# Patient Record
Sex: Female | Born: 1960
Health system: Southern US, Community
[De-identification: ages and names within clinical notes are randomized; demographics above are authoritative.]

## PROBLEM LIST (undated history)

## (undated) DIAGNOSIS — IMO0001 Reserved for inherently not codable concepts without codable children: Secondary | ICD-10-CM

## (undated) DIAGNOSIS — G629 Polyneuropathy, unspecified: Secondary | ICD-10-CM

## (undated) DIAGNOSIS — C3490 Malignant neoplasm of unspecified part of unspecified bronchus or lung: Secondary | ICD-10-CM

## (undated) DIAGNOSIS — I10 Essential (primary) hypertension: Secondary | ICD-10-CM

## (undated) DIAGNOSIS — E1165 Type 2 diabetes mellitus with hyperglycemia: Secondary | ICD-10-CM

## (undated) DIAGNOSIS — E785 Hyperlipidemia, unspecified: Secondary | ICD-10-CM

## (undated) DIAGNOSIS — J349 Unspecified disorder of nose and nasal sinuses: Secondary | ICD-10-CM

## (undated) DIAGNOSIS — IMO0002 Reserved for concepts with insufficient information to code with codable children: Secondary | ICD-10-CM

## (undated) DIAGNOSIS — J449 Chronic obstructive pulmonary disease, unspecified: Secondary | ICD-10-CM

## (undated) HISTORY — DX: Reserved for inherently not codable concepts without codable children: IMO0001

## (undated) HISTORY — DX: Essential (primary) hypertension: I10

## (undated) HISTORY — DX: Polyneuropathy, unspecified: G62.9

## (undated) HISTORY — DX: Unspecified disorder of nose and nasal sinuses: J34.9

## (undated) HISTORY — DX: Reserved for concepts with insufficient information to code with codable children: IMO0002

## (undated) HISTORY — DX: Hyperlipidemia, unspecified: E78.5

## (undated) HISTORY — DX: Malignant neoplasm of unspecified part of unspecified bronchus or lung: C34.90

---

## 1992-07-27 HISTORY — PX: TUBAL LIGATION: SHX77

## 1999-03-14 ENCOUNTER — Other Ambulatory Visit: Admission: RE | Admit: 1999-03-14 | Discharge: 1999-03-14 | Payer: Self-pay | Admitting: Obstetrics and Gynecology

## 2004-03-27 ENCOUNTER — Ambulatory Visit (HOSPITAL_COMMUNITY): Admission: RE | Admit: 2004-03-27 | Discharge: 2004-03-27 | Payer: Self-pay | Admitting: General Surgery

## 2004-03-27 ENCOUNTER — Encounter (INDEPENDENT_AMBULATORY_CARE_PROVIDER_SITE_OTHER): Payer: Self-pay | Admitting: Specialist

## 2005-03-25 ENCOUNTER — Other Ambulatory Visit: Admission: RE | Admit: 2005-03-25 | Discharge: 2005-03-25 | Payer: Self-pay | Admitting: Family Medicine

## 2006-10-06 ENCOUNTER — Encounter: Admission: RE | Admit: 2006-10-06 | Discharge: 2006-10-06 | Payer: Self-pay | Admitting: Sports Medicine

## 2006-10-20 ENCOUNTER — Encounter: Admission: RE | Admit: 2006-10-20 | Discharge: 2006-10-20 | Payer: Self-pay | Admitting: Sports Medicine

## 2006-11-03 ENCOUNTER — Encounter: Admission: RE | Admit: 2006-11-03 | Discharge: 2006-11-03 | Payer: Self-pay | Admitting: Sports Medicine

## 2008-01-23 ENCOUNTER — Emergency Department (HOSPITAL_BASED_OUTPATIENT_CLINIC_OR_DEPARTMENT_OTHER): Admission: EM | Admit: 2008-01-23 | Discharge: 2008-01-24 | Payer: Self-pay | Admitting: Emergency Medicine

## 2008-02-22 ENCOUNTER — Encounter (INDEPENDENT_AMBULATORY_CARE_PROVIDER_SITE_OTHER): Payer: Self-pay | Admitting: Obstetrics and Gynecology

## 2008-02-22 ENCOUNTER — Ambulatory Visit (HOSPITAL_COMMUNITY): Admission: RE | Admit: 2008-02-22 | Discharge: 2008-02-22 | Payer: Self-pay | Admitting: Obstetrics and Gynecology

## 2010-06-25 ENCOUNTER — Encounter: Admission: RE | Admit: 2010-06-25 | Discharge: 2010-06-25 | Payer: Self-pay | Admitting: Family Medicine

## 2010-12-09 NOTE — H&P (Signed)
Carol Buchanan, Carol Buchanan            ACCOUNT NO.:  [id number]   MEDICAL RECORD NO.:  [id number]          PATIENT TYPE:  AMB   LOCATION:  SDC                           FACILITY:  [facility]   PHYSICIAN:  Naima A. Dillard, M.D. DATE OF BIRTH:  01-Nov-1960   DATE OF ADMISSION:  02/22/2008  DATE OF DISCHARGE:                              HISTORY & PHYSICAL   The patient is a 50 year old female, gravida 3, para 0 who complained of  irregular heavy vaginal bleeding soaking through her clothes and lasting  greater than 10 days.  The patient is not on any contraception.  Denies  having any fibroids, not on any hormone therapy.  She does have  menopausal symptoms, which include mood swing, hot flashes, urinary  urgency, and poor sleeping.  The patient denies any pelvic pain.   PAST MEDICAL HISTORY:  Significant for chronic hypertension,  hypercholesterolemia, and arthritis.   MEDICATIONS:  Include Cymbalta, meloxicam, and the blood pressure  medicine and cholesterol medicine.  The patient forgot the name and did  not have her medicines with her.   ALLERGIES:  The patient has no known drug allergies.   SOCIAL HISTORY:  The patient smokes a pack a day for 30 years.  Occasional alcohol use.  No illicit drug use.   PAST SURGICAL HISTORY:  Surgeries are significant for bilateral tubal  ligation and 3 elective abortions.  The patient had LASIK surgery on her  eyes in May 2008.   FAMILY HISTORY:  No GYN cancer.  Significant for heart disease and high  blood pressure.   REVIEW OF SYSTEMS:  CARDIOVASCULAR:  Significant for hypertension.  HEMATOLOGICAL:  Significant for cholesterol.  MUSCULOSKELETAL:  Significant for arthritis.  GENITOURINARY:  Significant for heavy  vaginal bleeding.   PHYSICAL EXAMINATION:  VITAL SIGNS:  The patient weighted 141 pounds,  blood pressure is 110/70 and she is 5 feet 3-1/2 inches.  HEENT:  Pupils are equal.  Hearing is normal.  Throat is clear.  Thyroid  is not  enlarged.  HEART:  Regular rate and rhythm.  LUNGS:  Clear to auscultation bilaterally.  BREASTS:  No masses, discharge, skin change, or nipple retraction.  BACK:  No CVA tenderness.  ABDOMEN:  Nontender without any masses or organomegaly.  EXTREMITIES:  No cyanosis, clubbing, or edema.  NEUROLOGIC:  Within normal limits.  VAGINAL:  Within normal limits.  Cervix is nontender without any  lesions.  Uterus is normal in size, shape, and consistency.  Adnexa is  nontender.   On ultrasound, she had a uterus that measured 7.3 x 4.9 x 3.79 with  normal ovaries with 3 questionable hyperechoic masses; 0.82 x 0.47, 0.66  x 0.51, and 0.68 x 0.51 suggestive of polyps.  The patient was offered  sonohystogram, but  chose to do [facility] hysteroscopy with an endometrial ablation.  She  understands the risks are but not limited to bleeding, infection, damage  to internal organs, such as bowel, bladder, or major blood vessels, and  perforation of the uterus.  She desires to proceed with this.      Naima A. Normand Sloop, M.D.  Electronically  Signed     NAD/MEDQ  D:  02/22/2008  T:  02/22/2008  Job:  [id number]

## 2010-12-09 NOTE — Op Note (Signed)
NAMEJANELLI, WELLING            ACCOUNT NO.:  [id number]   MEDICAL RECORD NO.:  [id number]          PATIENT TYPE:  AMB   LOCATION:  SDC                           FACILITY:  [facility]   PHYSICIAN:  Naima A. Dillard, M.D. DATE OF BIRTH:  [birth year]   DATE OF PROCEDURE:  02/22/2008  DATE OF DISCHARGE:                               OPERATIVE REPORT   PREOPERATIVE DIAGNOSIS:  Menorrhagia.   POSTOPERATIVE DIAGNOSIS:  Menorrhagia.   PROCEDURES:  1. Dilation and curettage.  2. Hysteroscopy.  3. ThermaChoice endometrial ablation.   SURGEON:  Naima A. Dillard, MD   ASSISTANT:  None.   ANESTHESIA:  General.   FINDINGS:  Normal uterus with no endometrial polyps or submucosal  fibroids.  Both ostia visualized.  Uterus did sound to 7 cm.  Anesthesia  was general with 20 mL of 1% local for cervical block.  Endometrial  curettings were sent to pathology.   ESTIMATED BLOOD LOSS:  Minimal.   There were no complications.   The patient to PACU in stable condition.   PROCEDURE IN DETAIL:  The patient was taken to the operating room where  she was given general anesthesia with laryngeal mask airway, prepped and  draped in a normal sterile fashion in dorsal lithotomy position.  The  bladder was drained with a straight catheter.  A bivalve speculum was  placed into the vagina.  The anterior lip of the cervix was grasped with  a single-tooth tenaculum.  The cervix was then dilated with Pratt  dilators to 21.  The hysteroscope was placed into the uterine cavity.  Both ostia were visualized.  There was normal endometrial lining noted.  No submucosal fibroids or polyps visualized.  The hysteroscope was  removed.  A sharp curettage was done.  Endometrial curettings were sent  to pathology.  ThermaChoice was done per protocol without any  difficulty.  After ThermaChoice was done, I did take a second look and  the entire fundus and uterine cavity appeared to have been  properly ablated.  There were no  pink areas visualized.  All instruments  were removed from the vagina.  Tenaculum site was made hemostatic with  silver nitrate.  Sponge, lap, and needle counts were correct.  The  patient went to recovery room in stable condition.      Naima A. Normand Sloop, M.D.  Electronically Signed     NAD/MEDQ  D:  02/22/2008  T:  02/23/2008  Job:  [id number]

## 2010-12-12 NOTE — Op Note (Signed)
Carol Buchanan, Carol Buchanan                      ACCOUNT NO.:  [id number]   MEDICAL RECORD NO.:  [id number]                   PATIENT TYPE:  AMB   LOCATION:  DAY                                  FACILITY:  [facility]   PHYSICIAN:  Timothy E. Earlene Plater, M.D.              DATE OF BIRTH:  Oct 02, 1960   DATE OF PROCEDURE:  03/27/2004  DATE OF DISCHARGE:                                 OPERATIVE REPORT   PREOPERATIVE DIAGNOSES:  Internal and external hemorrhoids.   POSTOPERATIVE DIAGNOSES:  Internal and external hemorrhoids.   PROCEDURE:  Complex hemorrhoidectomy.   SURGEON:  Timothy E. Earlene Plater, M.D.   ANESTHESIA:  General.   [name] is 21 otherwise healthy, has had hemorrhoids for many months  with protrusion, pain and bleeding. A minor fissure has been treated  conservatively and she has failed other methods for treatment of hemorrhoids  and therefore wishes to proceed with a surgical hemorrhoidectomy. She agrees  and understands. She was identified and the permit signed.   She was taken to the operating room, placed supine, LMA anesthesia provided.  She was then lifted to lithotomy position, perianal area was inspected,  prepped and draped in the usual fashion.  Prominent anterior hemorrhoid was  present with prolapse, a small right posterior and a smaller left posterior  hemorrhoid were present with large internal and external components. The  anus was injected around and about with Marcaine, epinephrine and Wydase and  massaged in well. The anterior hemorrhoid was excised first after placing a  suture ligature of 2-0 chromic at its apex, it was carefully removed and  separated from the underlying sphincter and muscle layer. The subsequent  wound was then closed with a running 2-0 chromic with careful approximation  to the anoderm and external skin.  Bleeding was controlled. Attention was  turned to the right posterior and left posterior hemorrhoids which were  excised in a similar  fashion but smaller ellipses and each was repaired in a  similar fashion.  All areas were checked for bleeding. The wounds were dry.  To note, the prep was poor with a constant protrusion of liquid stool. This  was suctioned away and the intrarectal vault was prepped with three times  with Betadine.  To finish a Gelfoam gauze pack wrapped in Surgicel was  placed in the rectal canal.  Dry sterile dressing applied. She tolerated it  well and was extubated and taken to the recovery room in good condition.   Written and verbal instructions were given to her and her sister including  Percocet #[id number] and she will be followed in the office.                                               Timothy E. Earlene Plater, M.D.    TED/MEDQ  D:  03/27/2004  T:  03/27/2004  Job:  [id number]   cc:   Otilio Connors. Gerri Spore, M.D.  [address]  [place]  [place] [address]  Fax: [phone]

## 2011-04-24 LAB — PREGNANCY, URINE: Preg Test, Ur: NEGATIVE

## 2011-04-24 LAB — CBC
RBC: 4.24
WBC: 8.4

## 2011-10-09 ENCOUNTER — Other Ambulatory Visit: Payer: Self-pay | Admitting: Orthopedic Surgery

## 2011-10-09 DIAGNOSIS — M5412 Radiculopathy, cervical region: Secondary | ICD-10-CM

## 2011-10-13 ENCOUNTER — Ambulatory Visit
Admission: RE | Admit: 2011-10-13 | Discharge: 2011-10-13 | Disposition: A | Discharge status: Home / Self Care | Payer: BC Managed Care – PPO | Source: Ambulatory Visit | Attending: Orthopedic Surgery | Admitting: Orthopedic Surgery

## 2011-10-13 DIAGNOSIS — M5412 Radiculopathy, cervical region: Secondary | ICD-10-CM

## 2011-10-15 ENCOUNTER — Other Ambulatory Visit: Payer: Self-pay

## 2011-10-19 ENCOUNTER — Other Ambulatory Visit: Payer: Self-pay | Admitting: Orthopedic Surgery

## 2011-10-19 DIAGNOSIS — M4712 Other spondylosis with myelopathy, cervical region: Secondary | ICD-10-CM

## 2011-10-21 ENCOUNTER — Other Ambulatory Visit: Payer: BC Managed Care – PPO

## 2011-10-23 ENCOUNTER — Ambulatory Visit
Admission: RE | Admit: 2011-10-23 | Discharge: 2011-10-23 | Disposition: A | Discharge status: Home / Self Care | Payer: BC Managed Care – PPO | Source: Ambulatory Visit | Attending: Orthopedic Surgery | Admitting: Orthopedic Surgery

## 2011-10-23 DIAGNOSIS — M4712 Other spondylosis with myelopathy, cervical region: Secondary | ICD-10-CM

## 2012-03-16 ENCOUNTER — Other Ambulatory Visit: Payer: Self-pay | Admitting: Family Medicine

## 2012-03-16 DIAGNOSIS — Z1231 Encounter for screening mammogram for malignant neoplasm of breast: Secondary | ICD-10-CM

## 2012-04-06 ENCOUNTER — Ambulatory Visit
Admission: RE | Admit: 2012-04-06 | Discharge: 2012-04-06 | Disposition: A | Discharge status: Home / Self Care | Payer: BC Managed Care – PPO | Source: Ambulatory Visit | Attending: Family Medicine | Admitting: Family Medicine

## 2012-04-06 DIAGNOSIS — Z1231 Encounter for screening mammogram for malignant neoplasm of breast: Secondary | ICD-10-CM

## 2012-10-13 ENCOUNTER — Institutional Professional Consult (permissible substitution): Payer: BC Managed Care – PPO | Admitting: Emergency Medicine

## 2012-11-07 ENCOUNTER — Encounter: Payer: Self-pay | Admitting: Emergency Medicine

## 2012-11-07 ENCOUNTER — Ambulatory Visit (INDEPENDENT_AMBULATORY_CARE_PROVIDER_SITE_OTHER): Payer: BC Managed Care – PPO | Admitting: Emergency Medicine

## 2012-11-07 VITALS — BP 140/90 | HR 84 | Temp 98.6°F | Ht 64.0 in | Wt 176.6 lb

## 2012-11-07 DIAGNOSIS — R0600 Dyspnea, unspecified: Secondary | ICD-10-CM | POA: Insufficient documentation

## 2012-11-07 DIAGNOSIS — J302 Other seasonal allergic rhinitis: Secondary | ICD-10-CM | POA: Insufficient documentation

## 2012-11-07 DIAGNOSIS — J309 Allergic rhinitis, unspecified: Secondary | ICD-10-CM

## 2012-11-07 DIAGNOSIS — R0609 Other forms of dyspnea: Secondary | ICD-10-CM

## 2012-11-07 MED ORDER — LORATADINE 10 MG PO TABS: 10.0000 mg | ORAL_TABLET | Freq: Every day | ORAL | Status: DC

## 2012-11-07 NOTE — Patient Instructions (Addendum)
We will perform full pulmonary function testing  Please start loratadine 10mg  daily Please start Nasonex 2 sprays each nostril daily Continue your physical therapy Follow with Dr Delton Coombes next available appointment with full PFT

## 2012-11-07 NOTE — Assessment & Plan Note (Signed)
-   loratadine + nasonex

## 2012-11-07 NOTE — Assessment & Plan Note (Addendum)
Suspect due to COPD + deconditioning after her r foot injury +/- UA irritation and cough - full PFT  - defer starting any BD's for now pending PFT - treat PND - continue physical therapy

## 2012-11-07 NOTE — Progress Notes (Signed)
  Subjective:    Patient ID: Carol Buchanan, female    DOB: 01-10-1961, 52 y.o.   MRN: [id number]  HPI 52 yo former smoker, hx of HTN, hyperlipidemia, allergies. Presents today for eval of progressive exertional dyspnea. She is s/p R sgy about 6 months ago and has been very inactive and believes she is deconditioned. She hears wheezing with exertion. She still has some cough, better since she quit smoking but still does cough some, non-productive. Her GERD is controlled on omeprazole. She was on spiriva once, not sure if it helped.    Review of Systems  Constitutional: Positive for unexpected weight change. Negative for fever.  HENT: Positive for congestion. Negative for ear pain, nosebleeds, sore throat, rhinorrhea, sneezing, trouble swallowing, dental problem, postnasal drip and sinus pressure.   Eyes: Negative for redness and itching.  Respiratory: Positive for chest tightness, shortness of breath and wheezing. Negative for cough.   Cardiovascular: Negative for palpitations and leg swelling.  Gastrointestinal: Negative for nausea and vomiting.  Genitourinary: Negative for dysuria.  Musculoskeletal: Negative for joint swelling.  Skin: Negative for rash.  Neurological: Negative for headaches.  Hematological: Does not bruise/bleed easily.  Psychiatric/Behavioral: Negative for dysphoric mood. The patient is not nervous/anxious.   Also c/o GERD sx   Past Medical History  Diagnosis Date  . Hypertension   . Hyperlipidemia   . Sinus trouble   . Neuropathy      Family History  Problem Relation Age of Onset  . Heart disease Father   . Heart attack Father      History   Social History  . Marital Status: Single    Spouse Name: N/A    Number of Children: 0  . Years of Education: N/A   Occupational History  . unemployed    Social History Main Topics  . Smoking status: Former Smoker -- 1.50 packs/day for 35 years    Types: Cigarettes    Quit date: 01/26/2011  . Smokeless  tobacco: Not on file  . Alcohol Use: Yes     Comment: on weekends  . Drug Use: No  . Sexually Active: Not on file   Other Topics Concern  . Not on file   Social History Narrative  . No narrative on file     No Known Allergies   No outpatient prescriptions prior to visit.   No facility-administered medications prior to visit.       Objective:   Physical Exam Filed Vitals:   11/07/12 1349  BP: 140/90  Pulse: 84  Temp: 98.6 F (37 C)  TempSrc: Oral  Height: 5\' 4"  (1.626 m)  Weight: 176 lb 9.6 oz (80.105 kg)  SpO2: 96%   Gen: Pleasant, well-nourished, in no distress,  normal affect  ENT: No lesions,  mouth clear,  oropharynx clear, no postnasal drip  Neck: No JVD, no TMG, no carotid bruits  Lungs: No use of accessory muscles, no dullness to percussion, clear without rales or rhonchi  Cardiovascular: RRR, heart sounds normal, no murmur or gallops, no peripheral edema  Musculoskeletal: No deformities, no cyanosis or clubbing  Neuro: alert, non focal  Skin: Warm, no lesions or rashes     Assessment & Plan:  Dyspnea Suspect due to COPD + deconditioning after her r foot injury +/- UA irritation and cough - full PFT  - defer starting any BD's for now pending PFT - treat PND - continue physical therapy  Seasonal allergies - loratadine + nasonex

## 2012-11-25 ENCOUNTER — Telehealth: Payer: Self-pay | Admitting: Emergency Medicine

## 2012-11-25 MED ORDER — ALBUTEROL SULFATE HFA 108 (90 BASE) MCG/ACT IN AERS: 2.0000 | INHALATION_SPRAY | RESPIRATORY_TRACT | Status: DC | PRN

## 2012-11-25 NOTE — Telephone Encounter (Signed)
She can use albuterol 2 puffs q4h prn SOB

## 2012-11-25 NOTE — Telephone Encounter (Signed)
rx sent. Pt is aware. Jennifer Castillo, CMA  

## 2012-11-25 NOTE — Telephone Encounter (Signed)
Pt returned call. Kathleen W Perdue  

## 2012-11-25 NOTE — Telephone Encounter (Signed)
lmomtcb x1--spiriva not on list and not mentioned in last note

## 2012-11-25 NOTE — Telephone Encounter (Signed)
I spoke with pt. She stated she used to be on spiriva for her breathing. She stated she just would like an inhaler to help improve her breathing. She stated she was on the bike yesterday and about had an panik attack bc she couldn't breathe. She stated she just wants something. Please advise RB thanks  No Known Allergies

## 2012-12-09 ENCOUNTER — Ambulatory Visit (INDEPENDENT_AMBULATORY_CARE_PROVIDER_SITE_OTHER): Payer: BC Managed Care – PPO | Admitting: Emergency Medicine

## 2012-12-09 ENCOUNTER — Encounter: Payer: Self-pay | Admitting: Emergency Medicine

## 2012-12-09 VITALS — BP 142/78 | HR 96 | Ht 66.0 in | Wt 175.0 lb

## 2012-12-09 DIAGNOSIS — J4489 Other specified chronic obstructive pulmonary disease: Secondary | ICD-10-CM

## 2012-12-09 DIAGNOSIS — J302 Other seasonal allergic rhinitis: Secondary | ICD-10-CM

## 2012-12-09 DIAGNOSIS — R0989 Other specified symptoms and signs involving the circulatory and respiratory systems: Secondary | ICD-10-CM

## 2012-12-09 DIAGNOSIS — J309 Allergic rhinitis, unspecified: Secondary | ICD-10-CM

## 2012-12-09 DIAGNOSIS — J449 Chronic obstructive pulmonary disease, unspecified: Secondary | ICD-10-CM | POA: Insufficient documentation

## 2012-12-09 DIAGNOSIS — R0600 Dyspnea, unspecified: Secondary | ICD-10-CM

## 2012-12-09 LAB — PULMONARY FUNCTION TEST

## 2012-12-09 MED ORDER — TIOTROPIUM BROMIDE MONOHYDRATE 18 MCG IN CAPS: 18.0000 ug | ORAL_CAPSULE | Freq: Every day | RESPIRATORY_TRACT | Status: DC

## 2012-12-09 NOTE — Assessment & Plan Note (Signed)
Moderate AFL. She no longer smokes - trial spiriva + SABA prn - rov 6 weeks

## 2012-12-09 NOTE — Assessment & Plan Note (Addendum)
-   stop nasonex - continue loratadine.

## 2012-12-09 NOTE — Progress Notes (Signed)
  Subjective:    Patient ID: Carol Buchanan, female    DOB: 03-01-1961, 52 y.o.   MRN: [id number]  HPI 52 yo former smoker, hx of HTN, hyperlipidemia, allergies. Presents today for eval of progressive exertional dyspnea. She is s/p R sgy about 6 months ago and has been very inactive and believes she is deconditioned. She hears wheezing with exertion. She still has some cough, better since she quit smoking but still does cough some, non-productive. Her GERD is controlled on omeprazole. She was on spiriva once, not sure if it helped.   ROV 12/09/12 -- returns for f/u sob. Last time we ordered PFT, treated her PND. Didn't feel any real improvement with nasonex. She has used some albuterol >> feels that it helps her. No evidence exacerbation. Finished rehab.    Review of Systems  Constitutional: Negative for fever and unexpected weight change.  HENT: Negative for ear pain, nosebleeds, congestion, sore throat, rhinorrhea, sneezing, trouble swallowing, dental problem, postnasal drip and sinus pressure.   Eyes: Negative for redness and itching.  Respiratory: Positive for shortness of breath. Negative for cough, chest tightness and wheezing.   Cardiovascular: Negative for palpitations and leg swelling.  Gastrointestinal: Negative for nausea and vomiting.  Genitourinary: Negative for dysuria.  Musculoskeletal: Negative for joint swelling.  Skin: Negative for rash.  Neurological: Negative for headaches.  Hematological: Does not bruise/bleed easily.  Psychiatric/Behavioral: Negative for dysphoric mood. The patient is not nervous/anxious.   Also c/o GERD sx      Objective:   Physical Exam Filed Vitals:   12/09/12 1352  BP: 142/78  Pulse: 96  Height: 5\' 6"  (1.676 m)  Weight: 175 lb (79.379 kg)  SpO2: 94%   Gen: Pleasant, well-nourished, in no distress,  normal affect  ENT: No lesions,  mouth clear,  oropharynx clear, no postnasal drip  Neck: No JVD, no TMG, no carotid bruits  Lungs:  No use of accessory muscles, no dullness to percussion, clear without rales or rhonchi  Cardiovascular: RRR, heart sounds normal, no murmur or gallops, no peripheral edema  Musculoskeletal: No deformities, no cyanosis or clubbing  Neuro: alert, non focal  Skin: Warm, no lesions or rashes     Assessment & Plan:  Dyspnea Multifactorial - restriction + moderate AFL, COPD - start spiriva as a trial - SABA prn.  - rov 6 weeks   Seasonal allergies - stop nasonex - continue loratadine.   COPD (chronic obstructive pulmonary disease) Moderate AFL. She no longer smokes - trial spiriva + SABA prn - rov 6 weeks

## 2012-12-09 NOTE — Assessment & Plan Note (Signed)
Multifactorial - restriction + moderate AFL, COPD - start spiriva as a trial - SABA prn.  - rov 6 weeks

## 2012-12-09 NOTE — Progress Notes (Signed)
PFT done today. 

## 2012-12-09 NOTE — Patient Instructions (Addendum)
Please start Spiriva every day Use albuterol 2 puffs as needed for shortness of breath Follow with Dr Delton Coombes in 6 weeks or sooner if you have any problems

## 2013-01-20 ENCOUNTER — Ambulatory Visit (INDEPENDENT_AMBULATORY_CARE_PROVIDER_SITE_OTHER): Payer: BC Managed Care – PPO | Admitting: Emergency Medicine

## 2013-01-20 ENCOUNTER — Other Ambulatory Visit: Payer: BC Managed Care – PPO

## 2013-01-20 ENCOUNTER — Encounter: Payer: Self-pay | Admitting: Emergency Medicine

## 2013-01-20 VITALS — BP 122/78 | HR 78 | Temp 98.5°F | Ht 64.0 in | Wt 173.8 lb

## 2013-01-20 DIAGNOSIS — J449 Chronic obstructive pulmonary disease, unspecified: Secondary | ICD-10-CM

## 2013-01-20 DIAGNOSIS — Z23 Encounter for immunization: Secondary | ICD-10-CM

## 2013-01-20 NOTE — Patient Instructions (Addendum)
Please stay on Spiriva daily and albuterol as needed We will check blood work today  Have a flu shot this Fall Pneumovax today.  Follow with Dr Delton Coombes in 6 months or sooner if you have any problems

## 2013-01-20 NOTE — Assessment & Plan Note (Signed)
Please stay on Spiriva daily and albuterol as needed We will check blood work today  Have a flu shot this Fall Pneumovax today.  Follow with Dr Delton Coombes in 6 months or sooner if you have any problem

## 2013-01-20 NOTE — Progress Notes (Signed)
  Subjective:    Patient ID: Carol Buchanan, female    DOB: [birth year], 52 y.o.   MRN: [id number]  HPI 52 yo former smoker, hx of HTN, hyperlipidemia, allergies. Presents today for eval of progressive exertional dyspnea. She is s/p R sgy about 6 months ago and has been very inactive and believes she is deconditioned. She hears wheezing with exertion. She still has some cough, better since she quit smoking but still does cough some, non-productive. Her GERD is controlled on omeprazole. She was on spiriva once, not sure if it helped.   ROV 12/09/12 -- returns for f/u sob. Last time we ordered PFT, treated her PND. Didn't feel any real improvement with nasonex. She has used some albuterol >> feels that it helps her. No evidence exacerbation. Finished rehab.   ROV 01/20/13 -- f/u for moderate COPD. Last time we started Spiriva as a trial > she believes that it has helped her. Exertional tolerance is improved, does still wheeze sometimes after exertion. She uses albuterol in the am, rarely needs during the day. Her cough is much improved.     PULMONARY FUNCTON TEST 12/09/2012  FVC 3.37  FEV1 2.65  FEV1/FVC 78.6  FVC  % Predicted 73  FEV % Predicted 71  FeF 25-75 3.27  FeF 25-75 % Predicted 2.46     Review of Systems  Constitutional: Negative for fever and unexpected weight change.  HENT: Negative for ear pain, nosebleeds, congestion, sore throat, rhinorrhea, sneezing, trouble swallowing, dental problem, postnasal drip and sinus pressure.   Eyes: Negative for redness and itching.  Respiratory: Positive for shortness of breath. Negative for cough, chest tightness and wheezing.   Cardiovascular: Negative for palpitations and leg swelling.  Gastrointestinal: Negative for nausea and vomiting.  Genitourinary: Negative for dysuria.  Musculoskeletal: Negative for joint swelling.  Skin: Negative for rash.  Neurological: Negative for headaches.  Hematological: Does not bruise/bleed easily.   Psychiatric/Behavioral: Negative for dysphoric mood. The patient is not nervous/anxious.   Also c/o GERD sx      Objective:   Physical Exam Filed Vitals:   01/20/13 1410  BP: 122/78  Pulse: 78  Temp: 98.5 F (36.9 C)  TempSrc: Oral  Height: 5\' 4"  (1.626 m)  Weight: 173 lb 12.8 oz (78.835 kg)  SpO2: 93%   Gen: Pleasant, well-nourished, in no distress,  normal affect  ENT: No lesions,  mouth clear,  oropharynx clear, no postnasal drip  Neck: No JVD, no TMG, no carotid bruits  Lungs: No use of accessory muscles, no dullness to percussion, clear without rales or rhonchi  Cardiovascular: RRR, heart sounds normal, no murmur or gallops, no peripheral edema  Musculoskeletal: No deformities, no cyanosis or clubbing  Neuro: alert, non focal  Skin: Warm, no lesions or rashes     Assessment & Plan:  COPD (chronic obstructive pulmonary disease) Please stay on Spiriva daily and albuterol as needed We will check blood work today  Have a flu shot this Fall Pneumovax today.  Follow with Dr Delton Coombes in 6 months or sooner if you have any problem

## 2013-01-20 NOTE — Addendum Note (Signed)
Addended by: Orma Flaming D on: 01/20/2013 04:29 PM   Modules accepted: Orders

## 2013-01-25 ENCOUNTER — Telehealth: Payer: Self-pay | Admitting: Emergency Medicine

## 2013-01-25 NOTE — Telephone Encounter (Signed)
Spoke with patient, informed her of results as listed below per RB Patient verbalized understanding and nothing further needed at this time

## 2013-01-25 NOTE — Telephone Encounter (Signed)
Please notify her that a1-AT is normal. Thanks.

## 2013-01-25 NOTE — Telephone Encounter (Signed)
6.27.14 A1AT results are back Dr Delton Coombes not in the office until this afternoon - pt is aware  Dr Delton Coombes please advise on lab results / recs.  Thank you.

## 2013-04-02 ENCOUNTER — Other Ambulatory Visit: Payer: Self-pay | Admitting: Emergency Medicine

## 2013-04-03 ENCOUNTER — Telehealth: Payer: Self-pay | Admitting: Emergency Medicine

## 2013-04-03 NOTE — Telephone Encounter (Signed)
Spoke with patient, patient states she is out of refills for her [employer] I advised patient that I sent refill for this in today. Patient also stated the pharmacist at drug store thought patient was using her rescue inhaler to often Patient states she is using it no more than twice a day-- I advised patient that per inhaler directions she can use inhaler every 4 hours as needed. Patient verbalized understanding and nothing further needed at this time

## 2013-06-01 ENCOUNTER — Other Ambulatory Visit: Payer: Self-pay

## 2013-06-02 ENCOUNTER — Other Ambulatory Visit: Payer: Self-pay | Admitting: Sports Medicine

## 2013-06-02 DIAGNOSIS — M545 Low back pain: Secondary | ICD-10-CM

## 2013-06-11 ENCOUNTER — Ambulatory Visit
Admission: RE | Admit: 2013-06-11 | Discharge: 2013-06-11 | Disposition: A | Discharge status: Home / Self Care | Payer: BC Managed Care – PPO | Source: Ambulatory Visit | Attending: Sports Medicine | Admitting: Sports Medicine

## 2013-06-11 DIAGNOSIS — M545 Low back pain: Secondary | ICD-10-CM

## 2013-07-10 ENCOUNTER — Ambulatory Visit (INDEPENDENT_AMBULATORY_CARE_PROVIDER_SITE_OTHER): Payer: BC Managed Care – PPO | Admitting: Neurology

## 2013-07-10 ENCOUNTER — Encounter: Payer: Self-pay | Admitting: Neurology

## 2013-07-10 ENCOUNTER — Telehealth: Payer: Self-pay | Admitting: Neurology

## 2013-07-10 VITALS — BP 163/92 | HR 94 | Ht 63.0 in | Wt 179.0 lb

## 2013-07-10 DIAGNOSIS — M48061 Spinal stenosis, lumbar region without neurogenic claudication: Secondary | ICD-10-CM | POA: Insufficient documentation

## 2013-07-10 DIAGNOSIS — M4802 Spinal stenosis, cervical region: Secondary | ICD-10-CM | POA: Insufficient documentation

## 2013-07-10 MED ORDER — GABAPENTIN 300 MG PO CAPS: 600.0000 mg | ORAL_CAPSULE | Freq: Three times a day (TID) | ORAL | Status: DC

## 2013-07-10 MED ORDER — OXYCODONE-ACETAMINOPHEN 5-325 MG PREPACK: ORAL_TABLET | ORAL | Status: DC

## 2013-07-10 MED ORDER — OXYCODONE-ACETAMINOPHEN 5-325 MG PREPACK: 1.0000 | ORAL_TABLET | Freq: Three times a day (TID) | ORAL | Status: DC | PRN

## 2013-07-10 NOTE — Progress Notes (Signed)
History of Present Illness:   Ms. Bramel is a 52 yo right-handed Caucasian female, she is accompanied by her mother, referred by [facility] orthopedic surgeon Dr. Marthenia Rolling for evaluation of bilateral feet and hands paresthesia  She has past medical history of hyperlipidemia, hypertension, in February 2013, she suffered pneumonia, was put on Rocephin, also Levaquin 500 milligrams for 10 days, at 9th day of her treatment, August 31, 2011, she woke up noticed numbness tingling in her fingers, a month later, she also noticed bilateral plantar feet paresthesia, numb tingling burning discomfort, her finger paresthesia remained, has progressed over the past few weeks, she also noticed gradual onset unsteady gait, worsening numbness after prolonged sitting, subjective bilateral lower extremity weakness, she denies significant pain, there was no low back pain, no bowel bladder incontinence, she denies neck pain no shooting pain.  She is referred for MRI of cervical which has demonstrated multilevel degenerative disc disease, most severe  at C5 and 6 level, there is a moderate central canal stenosis, deformity of the thecal sac, I was able to see a realm of spinal fluid surrounding, no cord signal change, there is also a right greater than left C6 foramina stenosis, MRI of the brain was normal.   She reported her symptoms continued to progress mildly over time. She drinks up to 80z of hard liquor 3 times a week.   She reported 80% improvement with gabapentin 100 mg 1 tablets 3 times a day, she has quit smoking 2012, now she quit drinking since April 2014.  EMG nerve conduction failed to demonstrate large fiber peripheral neuropathy, there is evidence of bilateral carpal tunnel syndromes, left-side is moderate, right side is mild,  Extensive laboratory evaluation showed normal or negative protein electrophoresis, CMP, CBC, HIV, SSA/B..., TSH, ACE, RPR, ESR, vitamin B12 365, positive ANA, double-stranded ANA  antibody  She lost her job at the end of 2013, has been staying at home, since November 2014 radiating pain to bilateral lower extremity,she is tearful at today's clinical visit, she complains of gait difficulty, she denies significant problems bladder incontinence  We have reviewed MRI lumbar together, degenerative change of lumbar spine, with severe L2-3 degenerative disc disease. Moderate to severe canal stenosis at L4-5, mild to moderate at L2-3. Neural foraminal narrowing at L2-3, L4-5 and L5-S1: Moderate to severe on the left at L4-5.  Review of system complains of only the following symptoms, and all other reviewed systems are negative, shortness of breath, joints pain, low back pain, achy muscles, muscle cramps,   gait difficulty, numbness, generalized weakness   Social History  Patient lives at home with her mother and works for [employer] and has her GED. Patient quit tobacco in July 2013. Patient  quit drinking April 2014,  Patient denies any history of illict drugs. Patient drinks one cup of coffee daily. Inhaled Tobacco Use: former smoker  Family History  Patients mother is alive. low back surgery, with left foot drop,  Patients father is deceased.  Past Medical History  High blood pressure High cholesterol  Surgical History  None listed  Physical Exam  Neck: supple no carotid bruits Respiratory: clear to auscultation bilaterally Cardiovascular: regular rate rhythm  Neurologic Exam  Mental Status:  tearful during today's visit , awake, alert, cooperative to history, talking, and casual conversation. Cranial Nerves: CN II-XII pupils were equal round reactive to light.  Fundi were sharp bilaterally.  Extraocular movements were full.  Visual fields were full on confrontational test.  Facial sensation and strength were  normal.  Hearing was intact to finger rubbing bilaterally.  Uvula tongue were midline.  Head turning and shoulder shrugging were normal and symmetric.  Tongue  protrusion into the cheeks strength were normal.  Motor: Normal tone, bulk, and strength with exception of mild bilateral toe extension weakness, ,  Sensory: length dependent decreased light touch, pinprick to midcalf level.  proprioception, and vibratory sensation. Coordination: Normal finger-to-nose, heel-to-shin.  There was no dysmetria noticed. Gait and Station:  narrow based and steady, was able to perform tiptoe, heel, and  mild difficulty with tandem walking.  Romberg sign: Negative Reflexes: Deep tendon reflexes: Biceps: 2+/2+, Brachioradialis: 2+/2+, Triceps: 2+/2+, Pateller:  3/3, Achilles: 2/2.  Plantar responses are flexor.   Assessment and plan: 52 years old Caucasian female, complains of one-month history of low back pain, previously she complains of bilateral feet paresthesia , mild length-dependent sensory changes, brisk reflexes. we have reviewed MRI together,MRI has demonstrate C5 and 6  moderate canal stenosis.  MRI lumbar chest demonstrate degenerative change of lumbar spine, with severe L2-3 degenerative disc disease. Moderate to severe canal stenosis at L4-5, mild to moderate at L2-3. Neural foraminal narrowing at L2-3, L4-5 and L5-S1, moderate to severe on the left at L4-5.  1. she has evidence of lumbar radiculopathy, also has evidence of moderate cervical canal stenosis 2. proceed with physical therapy  3 referred to neurosurgeon for evaluation of potential lumbar decompression surgery  4 Percocet as needed, refill her gabapentin 300 mg 2 tablets 3 times a day 5 return to clinic in 6 months .

## 2013-07-11 ENCOUNTER — Telehealth: Payer: Self-pay | Admitting: Neurology

## 2013-07-11 NOTE — Telephone Encounter (Signed)
Pt here and picked up Rx Oxycodone.

## 2013-07-11 NOTE — Telephone Encounter (Signed)
Please advise 

## 2013-07-11 NOTE — Telephone Encounter (Signed)
Dr Terrace Arabia printed this Rx at [facility] yesterday.  Since it is a narcotic, it must be signed.  We will call the patient when the Rx is ready for pick up.  I called the patient to advise we will call her when the Rx is ready, and she will then be able to pick up the Rx from the front desk.  She verbalized understanding.

## 2013-07-13 ENCOUNTER — Encounter: Payer: Self-pay | Admitting: Emergency Medicine

## 2013-07-13 ENCOUNTER — Ambulatory Visit (INDEPENDENT_AMBULATORY_CARE_PROVIDER_SITE_OTHER): Payer: BC Managed Care – PPO | Admitting: Emergency Medicine

## 2013-07-13 VITALS — BP 132/80 | HR 90 | Ht 64.0 in | Wt 186.8 lb

## 2013-07-13 DIAGNOSIS — J449 Chronic obstructive pulmonary disease, unspecified: Secondary | ICD-10-CM

## 2013-07-13 NOTE — Progress Notes (Signed)
  Subjective:    Patient ID: Carol Buchanan, female    DOB: Dec 24, 1960, 52 y.o.   MRN: [id number]  HPI 52 yo former smoker, hx of HTN, hyperlipidemia, allergies. Presents today for eval of progressive exertional dyspnea. She is s/p R sgy about 6 months ago and has been very inactive and believes she is deconditioned. She hears wheezing with exertion. She still has some cough, better since she quit smoking but still does cough some, non-productive. Her GERD is controlled on omeprazole. She was on spiriva once, not sure if it helped.   ROV 12/09/12 -- returns for f/u sob. Last time we ordered PFT, treated her PND. Didn't feel any real improvement with nasonex. She has used some albuterol >> feels that it helps her. No evidence exacerbation. Finished rehab.   ROV 01/20/13 -- f/u for moderate COPD. Last time we started Spiriva as a trial > she believes that it has helped her. Exertional tolerance is improved, does still wheeze sometimes after exertion. She uses albuterol in the am, rarely needs during the day. Her cough is much improved.   ROV 07/13/13 -- hx COPD, cough. Started Spiriva in May 2014. She is scheduled for back sgy w Dr Gerlene Fee end of Dec. She uses ProAir bid. Her cough is much better. She hasn't been as active because she hurt her foot and now she needs back sgy.    PULMONARY FUNCTON TEST 12/09/2012  FVC 3.37  FEV1 2.65  FEV1/FVC 78.6  FVC  % Predicted 73  FEV % Predicted 71  FeF 25-75 3.27  FeF 25-75 % Predicted 2.46     Review of Systems  Constitutional: Negative for fever and unexpected weight change.  HENT: Negative for congestion, dental problem, ear pain, nosebleeds, postnasal drip, rhinorrhea, sinus pressure, sneezing, sore throat and trouble swallowing.   Eyes: Negative for redness and itching.  Respiratory: Positive for shortness of breath. Negative for cough, chest tightness and wheezing.   Cardiovascular: Negative for palpitations and leg swelling.   Gastrointestinal: Negative for nausea and vomiting.  Genitourinary: Negative for dysuria.  Musculoskeletal: Negative for joint swelling.  Skin: Negative for rash.  Neurological: Negative for headaches.  Hematological: Does not bruise/bleed easily.  Psychiatric/Behavioral: Negative for dysphoric mood. The patient is not nervous/anxious.   Also c/o GERD sx      Objective:   Physical Exam Filed Vitals:   07/13/13 1505  BP: 132/80  Pulse: 90  Height: 5\' 4"  (1.626 m)  Weight: 186 lb 12.8 oz (84.732 kg)  SpO2: 94%   Gen: Pleasant, well-nourished, in no distress,  normal affect  ENT: No lesions,  mouth clear,  oropharynx clear, no postnasal drip  Neck: No JVD, no TMG, no carotid bruits  Lungs: No use of accessory muscles, no dullness to percussion, clear without rales or rhonchi  Cardiovascular: RRR, heart sounds normal, no murmur or gallops, no peripheral edema  Musculoskeletal: No deformities, no cyanosis or clubbing  Neuro: alert, non focal  Skin: Warm, no lesions or rashes     Assessment & Plan:  COPD (chronic obstructive pulmonary disease) - continue spiriva - trial of dulera bid - proair prn - rov 1 month - pt at moderate increased risk for surgery, but is clear for surgery if benefits outweigh these risks.

## 2013-07-13 NOTE — Assessment & Plan Note (Signed)
-   continue spiriva - trial of dulera bid - proair prn - rov 1 month - pt at moderate increased risk for surgery, but is clear for surgery if benefits outweigh these risks.

## 2013-07-13 NOTE — Patient Instructions (Signed)
Please continue your Spiriva daily Try Dulera 2 puffs twice a day until next visit to see if you benefit Continue to use ProAir 2 puffs if needed for shortness of breath You are at a moderate increased risk for surgery. Please discuss the risks and benefits with Dr Gerlene Fee.  Follow with Dr Delton Coombes in 1 month to assess your progress on the [facility].

## 2013-07-14 NOTE — Telephone Encounter (Signed)
error 

## 2013-07-27 HISTORY — PX: BACK SURGERY: SHX140

## 2013-08-16 ENCOUNTER — Encounter: Payer: Self-pay | Admitting: Emergency Medicine

## 2013-08-16 ENCOUNTER — Ambulatory Visit (INDEPENDENT_AMBULATORY_CARE_PROVIDER_SITE_OTHER): Payer: BC Managed Care – PPO | Admitting: Emergency Medicine

## 2013-08-16 VITALS — BP 140/90 | HR 98 | Ht 64.0 in | Wt 177.8 lb

## 2013-08-16 DIAGNOSIS — J449 Chronic obstructive pulmonary disease, unspecified: Secondary | ICD-10-CM

## 2013-08-16 MED ORDER — MOMETASONE FURO-FORMOTEROL FUM 200-5 MCG/ACT IN AERO: 2.0000 | INHALATION_SPRAY | Freq: Two times a day (BID) | RESPIRATORY_TRACT | Status: DC

## 2013-08-16 MED ORDER — TIOTROPIUM BROMIDE MONOHYDRATE 18 MCG IN CAPS: 18.0000 ug | ORAL_CAPSULE | Freq: Every day | RESPIRATORY_TRACT | Status: DC

## 2013-08-16 NOTE — Progress Notes (Signed)
Subjective:    Patient ID: Carol Buchanan, female    DOB: 03-08-1961, 53 y.o.   MRN: [id number]  HPI 53 yo former smoker, hx of HTN, hyperlipidemia, allergies. Presents today for eval of progressive exertional dyspnea. She is s/p R sgy about 6 months ago and has been very inactive and believes she is deconditioned. She hears wheezing with exertion. She still has some cough, better since she quit smoking but still does cough some, non-productive. Her GERD is controlled on omeprazole. She was on spiriva once, not sure if it helped.   ROV 12/09/12 -- returns for f/u sob. Last time we ordered PFT, treated her PND. Didn't feel any real improvement with nasonex. She has used some albuterol >> feels that it helps her. No evidence exacerbation. Finished rehab.   ROV 01/20/13 -- f/u for moderate COPD. Last time we started Spiriva as a trial > she believes that it has helped her. Exertional tolerance is improved, does still wheeze sometimes after exertion. She uses albuterol in the am, rarely needs during the day. Her cough is much improved.   ROV 07/13/13 -- hx COPD, cough. Started Spiriva in May 2014. She is scheduled for back sgy w Dr Hal Neer end of Dec. She uses ProAir bid. Her cough is much better. She hasn't been as active because she hurt her foot and now she needs back sgy.   ROV 08/16/13 -- follows for hx COPD, cough. Last time did a trial adding Dulera to Spiriva > she has noticed significant improvement. She had sgy w Dr Hal Neer, went well except for an incisional infxn, on abx.    PULMONARY FUNCTON TEST 12/09/2012  FVC 3.37  FEV1 2.65  FEV1/FVC 78.6  FVC  % Predicted 73  FEV % Predicted 71  FeF 25-75 3.27  FeF 25-75 % Predicted 2.46     Review of Systems  Constitutional: Negative for fever and unexpected weight change.  HENT: Negative for congestion, dental problem, ear pain, nosebleeds, postnasal drip, rhinorrhea, sinus pressure, sneezing, sore throat and trouble swallowing.   Eyes:  Negative for redness and itching.  Respiratory: Positive for shortness of breath. Negative for cough, chest tightness and wheezing.   Cardiovascular: Negative for palpitations and leg swelling.  Gastrointestinal: Negative for nausea and vomiting.  Genitourinary: Negative for dysuria.  Musculoskeletal: Negative for joint swelling.  Skin: Negative for rash.  Neurological: Negative for headaches.  Hematological: Does not bruise/bleed easily.  Psychiatric/Behavioral: Negative for dysphoric mood. The patient is not nervous/anxious.   Also c/o GERD sx      Objective:   Physical Exam Filed Vitals:   08/16/13 1416  BP: 140/90  Pulse: 98  Height: 5\' 4"  (1.626 m)  Weight: 177 lb 12.8 oz (80.65 kg)  SpO2: 96%   Gen: Pleasant, well-nourished, in no distress,  normal affect  ENT: No lesions,  mouth clear,  oropharynx clear, no postnasal drip  Neck: No JVD, no TMG, no carotid bruits  Lungs: No use of accessory muscles, no dullness to percussion, clear without rales or rhonchi  Cardiovascular: RRR, heart sounds normal, no murmur or gallops, no peripheral edema  Musculoskeletal: No deformities, no cyanosis or clubbing  Neuro: alert, non focal  Skin: Warm, no lesions or rashes     Assessment & Plan:  COPD (chronic obstructive pulmonary disease) Since she has had a great response to the [facility], I wonder if it may be possible to peel off the Spiriva. I will continue both meds for now but consider a  trial off the spiriva in the future as long as she continues to do well.  - Dulera 200 + Spiriva - ProAir prn - rov 6

## 2013-08-16 NOTE — Addendum Note (Signed)
Addended by: Carlos American A on: 08/16/2013 02:33 PM   Modules accepted: Orders

## 2013-08-16 NOTE — Patient Instructions (Signed)
Please continue both Spiriva and Dulera for now Remember to rinse your mouth after the [facility] each use.  Follow with Dr Lamonte Sakai in 6 months or sooner if you have any problems

## 2013-08-16 NOTE — Assessment & Plan Note (Signed)
Since she has had a great response to the [facility], I wonder if it may be possible to peel off the Spiriva. I will continue both meds for now but consider a trial off the spiriva in the future as long as she continues to do well.  - Dulera 200 + Spiriva - ProAir prn - rov 6

## 2013-08-28 ENCOUNTER — Encounter (HOSPITAL_BASED_OUTPATIENT_CLINIC_OR_DEPARTMENT_OTHER): Payer: BC Managed Care – PPO | Attending: General Surgery

## 2013-08-28 DIAGNOSIS — T8189XA Other complications of procedures, not elsewhere classified, initial encounter: Secondary | ICD-10-CM | POA: Insufficient documentation

## 2013-08-28 DIAGNOSIS — Y838 Other surgical procedures as the cause of abnormal reaction of the patient, or of later complication, without mention of misadventure at the time of the procedure: Secondary | ICD-10-CM | POA: Insufficient documentation

## 2013-09-04 ENCOUNTER — Encounter (HOSPITAL_BASED_OUTPATIENT_CLINIC_OR_DEPARTMENT_OTHER): Payer: BC Managed Care – PPO

## 2013-09-25 ENCOUNTER — Encounter (HOSPITAL_BASED_OUTPATIENT_CLINIC_OR_DEPARTMENT_OTHER): Payer: BC Managed Care – PPO | Attending: General Surgery

## 2013-09-25 DIAGNOSIS — Y838 Other surgical procedures as the cause of abnormal reaction of the patient, or of later complication, without mention of misadventure at the time of the procedure: Secondary | ICD-10-CM | POA: Insufficient documentation

## 2013-09-25 DIAGNOSIS — S21209A Unspecified open wound of unspecified back wall of thorax without penetration into thoracic cavity, initial encounter: Secondary | ICD-10-CM | POA: Insufficient documentation

## 2013-09-26 NOTE — Progress Notes (Signed)
[facility]  NAME:  Carol Buchanan, Carol Buchanan            ACCOUNT NO.:  [id number]  MEDICAL RECORD NO.:  [id number]      DATE OF BIRTH:  10-29-1960  PHYSICIAN:  Irene Limbo, MD    VISIT DATE:  09/25/2013                                  OFFICE VISIT   SUBJECTIVE:  The patient is a 53 year old female that underwent a laminectomy in December 2014.  Her course has been complicated by wound opening in 2 areas of the surgical incision.  Her current wound care is silver alginate completed by her mother.  She has no underlying hardware.  Examination of the back reveals largely granulated wounds with some slough present.  There are 2 openings, one at the most proximal extent and one at the most distal extent of the wound.  There is no significant undermining. After application of topical anesthetic, selective debridement was performed to remove the slough over the entirety of the wound.  Hemostasis was obtained with pressure.    A/P: Given the size and minimal drainage from the wounds, we will plan to switch to collagen and currently the patient is changing this every other day.  We will continue with that and plan for followup in 1 week's time.          ______________________________ Irene Limbo, MD     BT/MEDQ  D:  09/25/2013  T:  09/26/2013  Job:  [id number]

## 2013-10-03 NOTE — Progress Notes (Signed)
[facility]  NAME:  Carol Buchanan, Carol Buchanan                 ACCOUNT NO.:  MEDICAL RECORD NO.:  [id number]      DATE OF BIRTH:  01-29-1961  PHYSICIAN:  Irene Limbo, MD    VISIT DATE:  10/02/2013                                  OFFICE VISIT   HISTORY:  The patient is a 53 year old female that is here for followup of back wounds suffered from postsurgical infection following laminectomy.  Last week, she was switched to collagen with foam dressing.  Her wounds continue to contract.  She does have a followup with her surgeon next week.  PHYSICAL EXAMINATION:  Blood pressure is 98, pulse 84, respirations are 16, blood pressure 135/80.  Back wound, proximal wound is measured 0.7 x 0.2 x 0.2 cm.  The distal wound appears to be nearly completely epithelialized, measured 0.3 x 0.1 x 0.2 cm.  This is both significantly contracted since her last visit.  No debridement was performed.  The base of the wounds over the distal wound cannot be visualized.  The base of the proximal wound is completely granulated with no slough present.  ASSESSMENT AND PLAN:  For continued care with collagen and Mepilex and plan for followup in 2 weeks' time.          ______________________________ Irene Limbo, MD     BT/MEDQ  D:  10/02/2013  T:  10/03/2013  Job:  [id number]

## 2013-10-17 NOTE — Progress Notes (Signed)
[facility]  NAME:  Carol Buchanan, Carol Buchanan                 ACCOUNT NO.:  MEDICAL RECORD NO.:  [id number]      DATE OF BIRTH:  04/12/1961  PHYSICIAN:  Irene Limbo, MD    VISIT DATE:  10/16/2013                                  OFFICE VISIT   SUBJECTIVE:  The patient is here for followup of back wound incurred following lumbar back surgery.  Her wound care at her last visit was Promogran.  She has been seen by her spine surgeon last week and has no followup scheduled.  PHYSICAL EXAMINATION:  On examination, she is completely epithelialized in the entirety of her wounds.  There is no drainage present.  According to the patient, this has been healed for several days.  PLAN:  AThe patient has healed wounds and will plan to discharge from [facility].          ______________________________ Irene Limbo, MD MBA     BT/MEDQ  D:  10/16/2013  T:  10/17/2013  Job:  [id number]

## 2014-01-08 ENCOUNTER — Ambulatory Visit: Payer: BC Managed Care – PPO | Admitting: Neurology

## 2014-01-16 ENCOUNTER — Ambulatory Visit (INDEPENDENT_AMBULATORY_CARE_PROVIDER_SITE_OTHER): Payer: BC Managed Care – PPO | Admitting: Neurology

## 2014-01-16 ENCOUNTER — Ambulatory Visit: Payer: BC Managed Care – PPO | Admitting: Neurology

## 2014-01-16 ENCOUNTER — Encounter: Payer: Self-pay | Admitting: Neurology

## 2014-01-16 VITALS — BP 130/81 | HR 72 | Ht 63.0 in | Wt 179.0 lb

## 2014-01-16 DIAGNOSIS — G629 Polyneuropathy, unspecified: Secondary | ICD-10-CM | POA: Insufficient documentation

## 2014-01-16 DIAGNOSIS — G589 Mononeuropathy, unspecified: Secondary | ICD-10-CM

## 2014-01-16 MED ORDER — GABAPENTIN 100 MG PO CAPS: 100.0000 mg | ORAL_CAPSULE | Freq: Three times a day (TID) | ORAL | Status: DC

## 2014-01-16 NOTE — Progress Notes (Signed)
History of Present Illness:   Carol Buchanan is a 53 yo right-handed Caucasian female, she is accompanied by her mother, referred by [facility] orthopedic surgeon Dr. Katherina Right for evaluation of bilateral feet and hands paresthesia  She has past medical history of hyperlipidemia, hypertension, in February 2013, she suffered pneumonia, was put on Rocephin, also Levaquin 500 milligrams for 10 days, at 9th day of her treatment, August 31, 2011, she woke up noticed numbness tingling in her fingers, a month later, she also noticed bilateral plantar feet paresthesia, numb tingling burning discomfort, her finger paresthesia remained, has progressed over the past few weeks, she also noticed gradual onset unsteady gait, worsening numbness after prolonged sitting, subjective bilateral lower extremity weakness, she denies significant pain, there was no low back pain, no bowel bladder incontinence, she denies neck pain no shooting pain.  She is referred for MRI of cervical which has demonstrated multilevel degenerative disc disease, most severe  at C5 and 6 level, there is a moderate central canal stenosis, deformity of the thecal sac, I was able to see a realm of spinal fluid surrounding, no cord signal change, there is also a right greater than left C6 foramina stenosis, MRI of the brain was normal.   She reported her symptoms continued to progress mildly over time. She drinks up to 80z of hard liquor 3 times a week.   She reported 80% improvement with gabapentin 100 mg 1 tablets 3 times a day, she has quit smoking 2012, now she quit drinking since April 2014.  EMG nerve conduction failed to demonstrate large fiber peripheral neuropathy, there is evidence of bilateral carpal tunnel syndromes, left-side is moderate, right side is mild,  Extensive laboratory evaluation showed normal or negative protein electrophoresis, CMP, CBC, HIV, SSA/B..., TSH, ACE, RPR, ESR, vitamin B12 365, positive ANA, double-stranded ANA  antibody  She lost her job at the end of 2013, has been staying at home, since November 2014 radiating pain to bilateral lower extremity,she is tearful at today's clinical visit, she complains of gait difficulty, she denies significant problems bladder incontinence  We have reviewed MRI lumbar together, degenerative change of lumbar spine, with severe L2-3 degenerative disc disease. Moderate to severe canal stenosis at L4-5, mild to moderate at L2-3. Neural foraminal narrowing at L2-3, L4-5 and L5-S1: Moderate to severe on the left at L4-5.  UPDATE January 16 2014: She was referred for surgical evaluation by Dr. Hal Neer in December 2014, initially had epidural injection without alleviating her low back pain, later had decompression surgery as out patient, I could not find office note, did very well, her bilateral feet paresthesia was gone, still has hand paresthesia, she still take gabapentin $RemoveBeforeD'300mg'[id number]$  qam, she continued complains of neck pain, radiating to bilateral shoulder, worsening by driving,  She denies significant gait difficulty, no bowel bladder incontinence,  Review of system complains of only the following symptoms, and all other reviewed systems are negative, shortness of breath, joints pain, low back pain, achy muscles, muscle cramps,   gait difficulty, numbness, generalized weakness   Social History  Patient lives at home with her mother and works for [employer] and has her GED. Patient quit tobacco in July 2013. Patient  quit drinking April 2014,  Patient denies any history of illict drugs. Patient drinks one cup of coffee daily. Inhaled Tobacco Use: former smoker  Family History  Patients mother is alive. low back surgery, with left foot drop,  Patients father is deceased.  Past Medical History  High blood pressure  High cholesterol  Surgical History  None listed  Physical Exam  Neck: supple no carotid bruits Respiratory: clear to auscultation bilaterally Cardiovascular: regular  rate rhythm  Neurologic Exam  Mental Status:  tearful during today's visit , awake, alert, cooperative to history, talking, and casual conversation. Cranial Nerves: CN II-XII pupils were equal round reactive to light.  Fundi were sharp bilaterally.  Extraocular movements were full.  Visual fields were full on confrontational test.  Facial sensation and strength were normal.  Hearing was intact to finger rubbing bilaterally.  Uvula tongue were midline.  Head turning and shoulder shrugging were normal and symmetric.  Tongue protrusion into the cheeks strength were normal.  Motor: Normal tone, bulk, and strength with exception of mild bilateral toe extension weakness, ,  Sensory: length dependent decreased light touch, pinprick to midcalf level.  proprioception, and vibratory sensation. Coordination: Normal finger-to-nose, heel-to-shin.  There was no dysmetria noticed. Gait and Station:  narrow based and steady, was able to perform tiptoe, heel, and  mild difficulty with tandem walking.  Romberg sign: Negative Reflexes: Deep tendon reflexes: Biceps: 2+/2+, Brachioradialis: 2+/2+, Triceps: 2+/2+, Pateller:  3/3, Achilles: 2/2.  Plantar responses are flexor.   Assessment and plan: 53 years old Caucasian female, complains of one-month history of low back pain, previously she complains of bilateral feet paresthesia , mild length-dependent sensory changes, brisk reflexes. we have reviewed MRI together,MRI has demonstrate C5 and 6  moderate canal stenosis.  MRI lumbar chest demonstrate degenerative change of lumbar spine, with severe L2-3 degenerative disc disease. Moderate to severe canal stenosis at L4-5, mild to moderate at L2-3. Neural foraminal narrowing at L2-3, L4-5 and L5-S1, moderate to severe on the left at L4-5.  Her bilateral lower extremity paresthesia has much improved with lumbar decompression surgery, she still take gabapentin 300 mg a day for bilateral upper extremity paresthesia, I have  refilled her medications, she will only return to clinic as needed

## 2014-01-19 ENCOUNTER — Ambulatory Visit: Payer: Self-pay | Admitting: Neurology

## 2014-03-28 ENCOUNTER — Ambulatory Visit (INDEPENDENT_AMBULATORY_CARE_PROVIDER_SITE_OTHER): Payer: BC Managed Care – PPO | Admitting: Emergency Medicine

## 2014-03-28 ENCOUNTER — Encounter: Payer: Self-pay | Admitting: Emergency Medicine

## 2014-03-28 VITALS — BP 138/88 | HR 86 | Ht 64.0 in | Wt 174.0 lb

## 2014-03-28 DIAGNOSIS — J449 Chronic obstructive pulmonary disease, unspecified: Secondary | ICD-10-CM

## 2014-03-28 NOTE — Assessment & Plan Note (Signed)
We will temporarily stop Spiriva and Dulera Start Anoro 2 inhalations daily for the next month. Call us if you benefit from this change so we can order through your pharmacy If you do not benefit then we will go back to Spiriva and [facility] Start loratadine 10 mg once a day Future flu shot as planned this fall Use your albuterol as needed for shortness of breath Follow with Dr Lamonte Sakai in 4 months or sooner if you have any problems.

## 2014-03-28 NOTE — Progress Notes (Signed)
Subjective:    Patient ID: Carol Buchanan, female    DOB: 05/05/1961, 53 y.o.   MRN: [id number]  HPI 53 yo former smoker, hx of HTN, hyperlipidemia, allergies. Presents today for eval of progressive exertional dyspnea. She is s/p R sgy about 6 months ago and has been very inactive and believes she is deconditioned. She hears wheezing with exertion. She still has some cough, better since she quit smoking but still does cough some, non-productive. Her GERD is controlled on omeprazole. She was on spiriva once, not sure if it helped.   ROV 12/09/12 -- returns for f/u sob. Last time we ordered PFT, treated her PND. Didn't feel any real improvement with nasonex. She has used some albuterol >> feels that it helps her. No evidence exacerbation. Finished rehab.   ROV 01/20/13 -- f/u for moderate COPD. Last time we started Spiriva as a trial > she believes that it has helped her. Exertional tolerance is improved, does still wheeze sometimes after exertion. She uses albuterol in the am, rarely needs during the day. Her cough is much improved.   ROV 07/13/13 -- hx COPD, cough. Started Spiriva in May 2014. She is scheduled for back sgy w Dr Hal Neer end of Dec. She uses ProAir bid. Her cough is much better. She hasn't been as active because she hurt her foot and now she needs back sgy.   ROV 08/16/13 -- follows for hx COPD, cough. Last time did a trial adding Dulera to Spiriva > she has noticed significant improvement. She had sgy w Dr Hal Neer, went well except for an incisional infxn, on abx.   ROV 03/28/14 -- follow up for COPD, cough. She is on [place]. She has been having more exertional SOB. Continues to go to gym, has been limited a bit by her knee. She hears wheezing w activity. She hasn't  Tried using the SABA. She has nasal congestion, has been using [place].     PULMONARY FUNCTON TEST 12/09/2012  FVC 3.37  FEV1 2.65  FEV1/FVC 78.6  FVC  % Predicted 73  FEV % Predicted 71  FeF 25-75 3.27   FeF 25-75 % Predicted 2.46     Review of Systems  Constitutional: Negative for fever and unexpected weight change.  HENT: Negative for congestion, dental problem, ear pain, nosebleeds, postnasal drip, rhinorrhea, sinus pressure, sneezing, sore throat and trouble swallowing.   Eyes: Negative for redness and itching.  Respiratory: Positive for shortness of breath. Negative for cough, chest tightness and wheezing.   Cardiovascular: Negative for palpitations and leg swelling.  Gastrointestinal: Negative for nausea and vomiting.  Genitourinary: Negative for dysuria.  Musculoskeletal: Negative for joint swelling.  Skin: Negative for rash.  Neurological: Negative for headaches.  Hematological: Does not bruise/bleed easily.  Psychiatric/Behavioral: Negative for dysphoric mood. The patient is not nervous/anxious.   Also c/o GERD sx      Objective:   Physical Exam Filed Vitals:   03/28/14 1336  BP: 138/88  Pulse: 86  Height: 5\' 4"  (1.626 m)  Weight: 174 lb (78.926 kg)  SpO2: 97%   Gen: Pleasant, well-nourished, in no distress,  normal affect  ENT: No lesions,  mouth clear,  oropharynx clear, no postnasal drip  Neck: No JVD, no TMG, no carotid bruits  Lungs: No use of accessory muscles, no dullness to percussion, clear without rales or rhonchi  Cardiovascular: RRR, heart sounds normal, no murmur or gallops, no peripheral edema  Musculoskeletal: No deformities, no cyanosis or clubbing  Neuro:  alert, non focal  Skin: Warm, no lesions or rashes     Assessment & Plan:  COPD (chronic obstructive pulmonary disease) We will temporarily stop Spiriva and Dulera Start Anoro 2 inhalations daily for the next month. Call us if you benefit from this change so we can order through your pharmacy If you do not benefit then we will go back to Spiriva and [facility] Start loratadine 10 mg once a day Future flu shot as planned this fall Use your albuterol as needed for shortness of  breath Follow with Dr Lamonte Sakai in 4 months or sooner if you have any problems.

## 2014-03-28 NOTE — Patient Instructions (Signed)
We will temporarily stop Spiriva and Dulera Start Anoro 2 inhalations daily for the next month. Call us if you benefit from this change so we can order through your pharmacy If you do not benefit then we will go back to Spiriva and [facility] Start loratadine 10 mg once a day Future flu shot as planned this fall Use your albuterol as needed for shortness of breath Follow with Dr Lamonte Sakai in 4 months or sooner if you have any problems.

## 2014-04-17 ENCOUNTER — Telehealth: Payer: Self-pay | Admitting: Emergency Medicine

## 2014-04-17 MED ORDER — UMECLIDINIUM-VILANTEROL 62.5-25 MCG/INH IN AEPB: 2.0000 | INHALATION_SPRAY | Freq: Every day | RESPIRATORY_TRACT | Status: DC

## 2014-04-17 NOTE — Telephone Encounter (Signed)
Per 03/28/14 OV: Patient Instructions      We will temporarily stop Spiriva and [facility] Start Anoro 2 inhalations daily for the next month. Call us if you benefit from this change so we can order through your pharmacy If you do not benefit then we will go back to Spiriva and [facility] Start loratadine 10 mg once a day Future flu shot as planned this fall Use your albuterol as needed for shortness of breath Follow with Dr Lamonte Sakai in 4 months or sooner if you have any problems   Called pt. She reports the anoro works better for her and likes this much better as well. Requesting RX be sent in. I have done so. Nothing further needed

## 2014-05-11 ENCOUNTER — Other Ambulatory Visit: Payer: Self-pay

## 2014-09-14 ENCOUNTER — Other Ambulatory Visit: Payer: Self-pay | Admitting: Emergency Medicine

## 2014-10-08 ENCOUNTER — Ambulatory Visit: Payer: Self-pay | Admitting: Emergency Medicine

## 2014-11-15 ENCOUNTER — Encounter: Payer: Self-pay | Admitting: Emergency Medicine

## 2014-11-15 ENCOUNTER — Ambulatory Visit (INDEPENDENT_AMBULATORY_CARE_PROVIDER_SITE_OTHER): Payer: BLUE CROSS/BLUE SHIELD | Admitting: Emergency Medicine

## 2014-11-15 VITALS — BP 150/98 | HR 86 | Ht 64.0 in | Wt 170.0 lb

## 2014-11-15 DIAGNOSIS — J449 Chronic obstructive pulmonary disease, unspecified: Secondary | ICD-10-CM

## 2014-11-15 DIAGNOSIS — Z23 Encounter for immunization: Secondary | ICD-10-CM | POA: Diagnosis not present

## 2014-11-15 NOTE — Progress Notes (Signed)
Subjective:    Patient ID: Carol Buchanan, female    DOB: 11-08-1960, 54 y.o.   MRN: [id number]  HPI 54 yo former smoker, hx of HTN, hyperlipidemia, allergies. Presents today for eval of progressive exertional dyspnea. She is s/p R sgy about 6 months ago and has been very inactive and believes she is deconditioned. She hears wheezing with exertion. She still has some cough, better since she quit smoking but still does cough some, non-productive. Her GERD is controlled on omeprazole. She was on spiriva once, not sure if it helped.   ROV 12/09/12 -- returns for f/u sob. Last time we ordered PFT, treated her PND. Didn't feel any real improvement with nasonex. She has used some albuterol >> feels that it helps her. No evidence exacerbation. Finished rehab.   ROV 01/20/13 -- f/u for moderate COPD. Last time we started Spiriva as a trial > she believes that it has helped her. Exertional tolerance is improved, does still wheeze sometimes after exertion. She uses albuterol in the am, rarely needs during the day. Her cough is much improved.   ROV 07/13/13 -- hx COPD, cough. Started Spiriva in May 2014. She is scheduled for back sgy w Dr Hal Neer end of Dec. She uses ProAir bid. Her cough is much better. She hasn't been as active because she hurt her foot and now she needs back sgy.   ROV 08/16/13 -- follows for hx COPD, cough. Last time did a trial adding Dulera to Spiriva > she has noticed significant improvement. She had sgy w Dr Hal Neer, went well except for an incisional infxn, on abx.   ROV 03/28/14 -- follow up for COPD, cough. She is on [place]. She has been having more exertional SOB. Continues to go to gym, has been limited a bit by her knee. She hears wheezing w activity. She hasn't  Tried using the SABA. She has nasal congestion, has been using [place].    ROV 11/15/14 -- follow-up visit for COPD and cough. At her last visit we stopped Spiriva and Dulera to see if she would benefit from  Anoro. We also started loratadine 10 mg daily. She believes that she is better on the Anoro. She isn';t using or needing ProAir frequently.  Her GERD is controlled.    PULMONARY FUNCTON TEST 12/09/2012  FVC 3.37  FEV1 2.65  FEV1/FVC 78.6  FVC  % Predicted 73  FEV % Predicted 71  FeF 25-75 3.27  FeF 25-75 % Predicted 2.46     Review of Systems  Constitutional: Negative for fever and unexpected weight change.  HENT: Negative for congestion, dental problem, ear pain, nosebleeds, postnasal drip, rhinorrhea, sinus pressure, sneezing, sore throat and trouble swallowing.   Eyes: Negative for redness and itching.  Respiratory: Positive for shortness of breath. Negative for cough, chest tightness and wheezing.   Cardiovascular: Negative for palpitations and leg swelling.  Gastrointestinal: Negative for nausea and vomiting.  Genitourinary: Negative for dysuria.  Musculoskeletal: Negative for joint swelling.  Skin: Negative for rash.  Neurological: Negative for headaches.  Hematological: Does not bruise/bleed easily.  Psychiatric/Behavioral: Negative for dysphoric mood. The patient is not nervous/anxious.         Objective:   Physical Exam Filed Vitals:   11/15/14 1543  BP: 150/98  Pulse: 86  Height: '5\' 4"'$  (1.626 m)  Weight: 170 lb (77.111 kg)  SpO2: 96%   Gen: Pleasant, well-nourished, in no distress,  normal affect  ENT: No lesions,  mouth clear,  oropharynx clear, no postnasal drip  Neck: No JVD, no TMG, no carotid bruits  Lungs: No use of accessory muscles, clear without rales or rhonchi  Cardiovascular: RRR, heart sounds normal, no murmur or gallops, no peripheral edema  Musculoskeletal: No deformities, no cyanosis or clubbing  Neuro: alert, non focal  Skin: Warm, no lesions or rashes     Assessment & Plan:  COPD (chronic obstructive pulmonary disease) She is clinically stable at this time. She tolerated the change to Anoro very well. We will continue this  regimen. She will have the Prevnar today. She will continue work on her exercise. rov 6

## 2014-11-15 NOTE — Assessment & Plan Note (Signed)
She is clinically stable at this time. She tolerated the change to Anoro very well. We will continue this regimen. She will have the Prevnar today. She will continue work on her exercise. rov 6

## 2014-11-15 NOTE — Patient Instructions (Signed)
Please continue your current medications as you have been taking them Continue to work hard on your exercise routine We will give you the Prevnar pneumonia vaccine today Follow with Dr Lamonte Sakai in 6 months or sooner if you have any problems

## 2014-11-15 NOTE — Addendum Note (Signed)
Addended by: Desmond Dike C on: 11/15/2014 04:10 PM   Modules accepted: Orders

## 2015-01-08 ENCOUNTER — Other Ambulatory Visit: Payer: Self-pay | Admitting: Critical Care Medicine

## 2015-04-03 ENCOUNTER — Ambulatory Visit (INDEPENDENT_AMBULATORY_CARE_PROVIDER_SITE_OTHER): Payer: Self-pay | Admitting: Emergency Medicine

## 2015-04-03 VITALS — BP 122/80 | HR 80 | Temp 97.9°F | Resp 16 | Ht 62.5 in | Wt 165.0 lb

## 2015-04-03 DIAGNOSIS — Z021 Encounter for pre-employment examination: Secondary | ICD-10-CM

## 2015-04-03 DIAGNOSIS — Z0000 Encounter for general adult medical examination without abnormal findings: Secondary | ICD-10-CM

## 2015-04-03 NOTE — Progress Notes (Signed)
Patient ID: Carol Buchanan, female   DOB: [birth year], 54 y.o.   MRN: [id number]    This chart was scribed for Darlyne Russian, MD by Ladene Artist, ED Scribe. The patient was seen in room 8. Patient's care was started at 1:48 PM.   Chief Complaint:  Chief Complaint  Patient presents with  . Employment Physical    DOT    HPI: Carol Buchanan is a 54 y.o. 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 Pt reports outpatient back surgery in 2015. She reports recently returning to work after being laid off on 06/17/12 for a work-related foot injury on 05/31/12. She had ankle surgery in 07/2012.   Pulmonology  She is followed by pulmonologist Baltazar Apo, MD; last seen on 11/15/14 and her COPD was stable at that time. She is currently on a new medication Anoro Ellipta x1 in the mornings with significant relief for COPD. She states that she has not used her inhaler in a while; states that she has not needed it. Pt quit smoking 4 years ago.   Past Medical History  Diagnosis Date  . Hypertension   . Hyperlipidemia   . Sinus trouble   . Neuropathy    Past Surgical History  Procedure Laterality Date  . Tubal ligation  1994  . Back surgery  2015   Social History   Social History  . Marital Status: Single    Spouse Name: N/A  . Number of Children: 0  . Years of Education: 13   Occupational History  . unemployed    Social History Main Topics  . Smoking status: Former Smoker -- 1.50 packs/day for 35 years    Types: Cigarettes    Quit date: 01/26/2011  . Smokeless tobacco: Never Used  . Alcohol Use: 1.0 oz/week    2 drink(s) per week     Comment: on weekends  . Drug Use: No  . Sexual Activity: Not Asked   Other Topics Concern  . None   Social History Narrative   Patient lives at home with her mother.   Patient is not working   Right handed   Education- One year of college   Family History  Problem  Relation Age of Onset  . Heart disease Father   . Heart attack Father   . Arthritis Mother    No Known Allergies Prior to Admission medications   Medication Sig Start Date End Date Taking? Authorizing Provider  Ascorbic Acid (VITAMIN C) 1000 MG tablet Take 1,000 mg by mouth daily.   Yes Historical Provider, MD  Calcium Carbonate-Vit D-Min 1200-1000 MG-UNIT CHEW Chew 1 tablet by mouth daily.   Yes Historical Provider, MD  estradiol (VIVELLE-DOT) 0.05 MG/24HR patch Place 1 patch onto the skin 2 (two) times a week.   Yes Historical Provider, MD  ibuprofen (ADVIL,MOTRIN) 800 MG tablet 800 mg as needed. 01/13/14  Yes Historical Provider, MD  meloxicam (MOBIC) 15 MG tablet Take 15 mg by mouth daily.   Yes Historical Provider, MD  omeprazole (PRILOSEC) 20 MG capsule Take 20 mg by mouth daily.   Yes Historical Provider, MD  simvastatin (ZOCOR) 20 MG tablet Take 20 mg by mouth every evening.   Yes Historical Provider, MD  venlafaxine XR (EFFEXOR-XR) 150 MG 24 hr capsule Take 150 mg by mouth daily.   Yes Historical Provider, MD  verapamil (COVERA HS) 180 MG (CO) 24 hr tablet Take 180  mg by mouth daily.    Yes Historical Provider, MD  Celedonio Miyamoto 62.5-25 MCG/INH AEPB inhale 2 puffs INTO THE LUNGS DAILY Patient not taking: Reported on 04/03/2015 01/08/15   Collene Gobble, MD  PROAIR HFA 108 (90 BASE) MCG/ACT inhaler inhale 2 puffs by mouth every 4 hours if needed for shortness of breath Patient not taking: Reported on 04/03/2015 04/02/13   Collene Gobble, MD     ROS: The patient denies fevers, chills, night sweats, unintentional weight loss, chest pain, palpitations, wheezing, dyspnea on exertion, nausea, vomiting, abdominal pain, dysuria, hematuria, melena, numbness, weakness, or tingling.   All other systems have been reviewed and were otherwise negative with the exception of those mentioned in the HPI and as above.    PHYSICAL EXAM: Filed Vitals:   04/03/15 1247  BP: 122/80  Pulse: 80  Temp: 97.9  F (36.6 C)  Resp: 16   Body mass index is 29.68 kg/(m^2).   General: Alert, no acute distress HEENT:  Normocephalic, atraumatic, oropharynx patent. Eye: Juliette Mangle [facility] Cardiovascular:  Regular rate and rhythm, no rubs murmurs or gallops.  No Carotid bruits, radial pulse intact. No pedal edema.  Respiratory: Course rhonchi bilaterally. No wheezes or rales. No cyanosis, no use of accessory musculature Abdominal: No organomegaly, abdomen is soft and non-tender, positive bowel sounds.  No masses. Musculoskeletal: Gait intact. No edema, tenderness Skin: No rashes. Neurologic: Facial musculature symmetric. Psychiatric: Patient acts appropriately throughout our interaction. Lymphatic: No cervical or submandibular lymphadenopathy Genitourinary/Anorectal: No acute findings  LABS: Results for orders placed or performed in visit on 01/20/13  Alpha-1 antitrypsin phenotype  Result Value Ref Range   A-1 Antitrypsin 122 83 - 199 mg/dL   Results Received SEE NOTE     EKG/XRAY:   Primary read interpreted by Dr. Everlene Farrier at [facility].   ASSESSMENT/PLAN:  patient has significant COPD. She has quit smoking. Blood pressure is at goal. She has been out of work now for greater than 2 years due to the foot injury. 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   Gross sideeffects, risk and benefits, and alternatives of medications d/w patient. Patient is aware that all medications have potential sideeffects and we are unable to predict every sideeffect or drug-drug interaction that may occur.  Arlyss Queen MD 04/03/2015 1:47 PM

## 2015-04-15 ENCOUNTER — Ambulatory Visit: Payer: BLUE CROSS/BLUE SHIELD | Admitting: Internal Medicine

## 2015-04-30 ENCOUNTER — Other Ambulatory Visit: Payer: Self-pay | Admitting: Emergency Medicine

## 2015-05-13 ENCOUNTER — Other Ambulatory Visit: Payer: Self-pay | Admitting: Emergency Medicine

## 2015-05-14 ENCOUNTER — Ambulatory Visit: Payer: BLUE CROSS/BLUE SHIELD | Admitting: Emergency Medicine

## 2015-06-10 ENCOUNTER — Ambulatory Visit: Payer: BLUE CROSS/BLUE SHIELD | Admitting: Emergency Medicine

## 2015-06-10 ENCOUNTER — Telehealth: Payer: Self-pay | Admitting: Emergency Medicine

## 2015-06-10 NOTE — Telephone Encounter (Signed)
Called and spoke to pt. Pt requesting to rescheduled to a sooner appt than Jan with RB. Appt made with RB on 07/04/15. Pt verbalized understanding and denied any further questions or concerns at this time.

## 2015-06-21 ENCOUNTER — Encounter (HOSPITAL_COMMUNITY): Payer: Self-pay | Admitting: *Deleted

## 2015-06-21 ENCOUNTER — Emergency Department (HOSPITAL_COMMUNITY): Payer: BLUE CROSS/BLUE SHIELD

## 2015-06-21 ENCOUNTER — Emergency Department (HOSPITAL_COMMUNITY)
Admission: EM | Admit: 2015-06-21 | Discharge: 2015-06-22 | Disposition: A | Discharge status: Home / Self Care | Payer: BLUE CROSS/BLUE SHIELD | Attending: Emergency Medicine | Admitting: Emergency Medicine

## 2015-06-21 DIAGNOSIS — J449 Chronic obstructive pulmonary disease, unspecified: Secondary | ICD-10-CM | POA: Diagnosis not present

## 2015-06-21 DIAGNOSIS — I10 Essential (primary) hypertension: Secondary | ICD-10-CM | POA: Diagnosis not present

## 2015-06-21 DIAGNOSIS — E785 Hyperlipidemia, unspecified: Secondary | ICD-10-CM | POA: Insufficient documentation

## 2015-06-21 DIAGNOSIS — C3412 Malignant neoplasm of upper lobe, left bronchus or lung: Secondary | ICD-10-CM | POA: Insufficient documentation

## 2015-06-21 DIAGNOSIS — Z87891 Personal history of nicotine dependence: Secondary | ICD-10-CM | POA: Diagnosis not present

## 2015-06-21 DIAGNOSIS — R1012 Left upper quadrant pain: Secondary | ICD-10-CM | POA: Diagnosis present

## 2015-06-21 DIAGNOSIS — Z79899 Other long term (current) drug therapy: Secondary | ICD-10-CM | POA: Insufficient documentation

## 2015-06-21 DIAGNOSIS — Z791 Long term (current) use of non-steroidal anti-inflammatories (NSAID): Secondary | ICD-10-CM | POA: Insufficient documentation

## 2015-06-21 HISTORY — DX: Chronic obstructive pulmonary disease, unspecified: J44.9

## 2015-06-21 LAB — BASIC METABOLIC PANEL
Anion gap: 9 (ref 5–15)
BUN: 10 mg/dL (ref 6–20)
CALCIUM: 9.4 mg/dL (ref 8.9–10.3)
CO2: 25 mmol/L (ref 22–32)
CREATININE: 0.6 mg/dL (ref 0.44–1.00)
Chloride: 105 mmol/L (ref 101–111)
Glucose, Bld: 87 mg/dL (ref 65–99)
Potassium: 3.3 mmol/L — ABNORMAL LOW (ref 3.5–5.1)
Sodium: 139 mmol/L (ref 135–145)

## 2015-06-21 LAB — CBC
HCT: 38.6 % (ref 36.0–46.0)
Hemoglobin: 12.7 g/dL (ref 12.0–15.0)
MCH: 31.1 pg (ref 26.0–34.0)
MCHC: 32.9 g/dL (ref 30.0–36.0)
MCV: 94.6 fL (ref 78.0–100.0)
PLATELETS: 263 10*3/uL (ref 150–400)
RBC: 4.08 MIL/uL (ref 3.87–5.11)
RDW: 12.9 % (ref 11.5–15.5)
WBC: 7.9 10*3/uL (ref 4.0–10.5)

## 2015-06-21 LAB — I-STAT TROPONIN, ED: TROPONIN I, POC: 0 ng/mL (ref 0.00–0.08)

## 2015-06-21 MED ORDER — IOHEXOL 300 MG/ML  SOLN
75.0000 mL | Freq: Once | INTRAMUSCULAR | Status: AC | PRN
  Administered 2015-06-21: 75 mL via INTRAVENOUS

## 2015-06-21 NOTE — ED Notes (Signed)
Pt requesting repeat chest x-ray since the CD could not be read that showed previous chest x-ray.  Reports popping pain to LUQ/rib pain.

## 2015-06-21 NOTE — ED Notes (Signed)
Pt states started having LUQ popping pain.  Urgent care did a chest xray and was told something is going on in her left lung and she needs to have a CT scan. PT reports off and on pain.  Pt recently got over pneumonia. Pain is intermittent. Pt has copy of cxr with her on disc

## 2015-06-21 NOTE — ED Provider Notes (Signed)
CSN: [id number]     Arrival date & time 06/21/15  1605 History   First MD Initiated Contact with Patient 06/21/15 2114     Chief Complaint  Patient presents with  . Abdominal Pain    Patient is a 54 y.o. female presenting with abdominal pain. The history is provided by the patient.  Abdominal Pain Pain location:  LUQ Pain quality: aching   Pain radiates to:  Does not radiate Pain severity:  Moderate Onset quality:  Gradual Duration:  2 days Timing:  Constant Chronicity:  New Exacerbated by: exertion. Associated symptoms: no chest pain, no constipation, no cough, no dysuria, no fever, no hematuria, no nausea, no shortness of breath, no sore throat and no vomiting     Past Medical History  Diagnosis Date  . Hypertension   . Hyperlipidemia   . Sinus trouble   . Neuropathy ([place])   . COPD (chronic obstructive pulmonary disease) [facility])    Past Surgical History  Procedure Laterality Date  . Tubal ligation  1994  . Back surgery  2015   Family History  Problem Relation Age of Onset  . Heart disease Father   . Heart attack Father   . Arthritis Mother    Social History  Substance Use Topics  . Smoking status: Former Smoker -- 1.50 packs/day for 35 years    Types: Cigarettes    Quit date: 01/26/2011  . Smokeless tobacco: Never Used  . Alcohol Use: 1.0 oz/week    2 Standard drinks or equivalent per week     Comment: on weekends   OB History    No data available     Review of Systems  Constitutional: Negative for fever.  HENT: Negative for rhinorrhea and sore throat.   Eyes: Negative for visual disturbance.  Respiratory: Negative for cough, chest tightness and shortness of breath.   Cardiovascular: Negative for chest pain and palpitations.  Gastrointestinal: Positive for abdominal pain. Negative for nausea, vomiting and constipation.  Genitourinary: Negative for dysuria and hematuria.  Musculoskeletal: Negative for back pain and neck pain.  Skin: Negative for rash.   Neurological: Negative for dizziness and headaches.  Psychiatric/Behavioral: Negative for confusion.  All other systems reviewed and are negative.     Allergies  Review of patient's allergies indicates no known allergies.  Home Medications   Prior to Admission medications   Medication Sig Start Date End Date Taking? Authorizing Provider  ANORO ELLIPTA 62.5-25 MCG/INH AEPB inhale 2 puffs INTO THE LUNGS DAILY 04/30/15  Yes Collene Gobble, MD  Ascorbic Acid (VITAMIN C) 1000 MG tablet Take 1,000 mg by mouth daily.   Yes Historical Provider, MD  Calcium Carbonate-Vit D-Min 1200-1000 MG-UNIT CHEW Chew 1 tablet by mouth daily.   Yes Historical Provider, MD  estradiol (VIVELLE-DOT) 0.05 MG/24HR patch Place 1 patch onto the skin 2 (two) times a week. Tuesday & Friday   Yes Historical Provider, MD  ibuprofen (ADVIL,MOTRIN) 800 MG tablet Take 800 mg by mouth every 8 (eight) hours as needed for moderate pain.  01/13/14  Yes Historical Provider, MD  meloxicam (MOBIC) 15 MG tablet Take 15 mg by mouth daily.   Yes Historical Provider, MD  omeprazole (PRILOSEC) 20 MG capsule Take 20 mg by mouth daily.   Yes Historical Provider, MD  PROAIR HFA 108 (90 BASE) MCG/ACT inhaler inhale 1 puff every 4 hours if needed for shortness of breath 05/13/15  Yes Collene Gobble, MD  simvastatin (ZOCOR) 20 MG tablet Take 20 mg by  mouth daily.    Yes Historical Provider, MD  venlafaxine XR (EFFEXOR-XR) 150 MG 24 hr capsule Take 150 mg by mouth daily.   Yes Historical Provider, MD  verapamil (COVERA HS) 180 MG (CO) 24 hr tablet Take 180 mg by mouth daily.    Yes Historical Provider, MD   BP 156/80 mmHg  Pulse 83  Temp(Src) 98.6 F (37 C) (Oral)  Resp 17  SpO2 96% Physical Exam  Constitutional: She is oriented to person, place, and time. She appears well-developed and well-nourished. No distress.  HENT:  Head: Normocephalic and atraumatic.  Mouth/Throat: Oropharynx is clear and moist.  Eyes: EOM are normal. Pupils  are equal, round, and reactive to light.  Neck: Neck supple. No JVD present.  Cardiovascular: Normal rate, regular rhythm, normal heart sounds and intact distal pulses.  Exam reveals no gallop.   No murmur heard. Pulmonary/Chest: Effort normal. She has decreased breath sounds in the left upper field, the left middle field and the left lower field. She has no wheezes. She has no rales.  Abdominal: Soft. She exhibits no distension. There is no tenderness.  Musculoskeletal: Normal range of motion. She exhibits no tenderness.  Neurological: She is alert and oriented to person, place, and time. No cranial nerve deficit. She exhibits normal muscle tone.  Skin: Skin is warm and dry. No rash noted.  Psychiatric: Her behavior is normal.    ED Course  Procedures  None  Labs Review Labs Reviewed  BASIC METABOLIC PANEL - Abnormal; Notable for the following:    Potassium 3.3 (*)    All other components within normal limits  CBC  I-STAT TROPOININ, ED    Imaging Review Dg Chest 2 View  06/21/2015  CLINICAL DATA:  Stabbing pain and soreness along the ribs under the left breast. COPD. Previous smoker. Rib pain. EXAM: CHEST  2 VIEW COMPARISON:  06/25/2010 FINDINGS: There is increased opacity diffusely over the left lung on the PA view with volume loss and anterior consolidation noted on the lateral view. This is consistent with collapse of the left upper lung and lingula. Underlying etiology is not identified. Consider possible obstructing lesion, endobronchial lesion, or foreign body. Right lung is clear and expanded. No blunting of costophrenic angles. No pneumothorax. Normal heart size and pulmonary vascularity. IMPRESSION: Collapse of the left upper lung and lingula. Electronically Signed   By: Lucienne Capers M.D.   On: 06/21/2015 21:11   Ct Chest W Contrast  06/21/2015  CLINICAL DATA:  Shortness of breath and left-sided chest pain today. Spontaneous collapsed lung. COPD. Long-term smoker. EXAM:  CT CHEST WITH CONTRAST TECHNIQUE: Multidetector CT imaging of the chest was performed during intravenous contrast administration. CONTRAST:  51m OMNIPAQUE IOHEXOL 300 MG/ML  SOLN COMPARISON:  Chest radiograph 06/21/2015 FINDINGS: Collapse and consolidation of the left upper lung and left lingula. Bronchial cut off sign with low-attenuation in the left hilum and probable enlarged left hilar lymph node measuring about 2.2 cm diameter. Additional enlarged lymph nodes in the aortopulmonic window region, anterior mediastinum, left paratracheal, pretracheal, and subcarinal regions. Findings likely due to obstructing malignancy with metastatic mediastinal lymph nodes. Bronchoscopy may be useful in further evaluation if clinically indicated. Atelectasis in the lung bases. No pleural effusions. No pneumothorax. Normal heart size. Normal caliber thoracic aorta. Aberrant right subclavian artery. Esophagus is decompressed. Included portions of the upper abdominal organs demonstrate diffuse fatty infiltration of the liver. No adrenal gland nodules are demonstrated although the left adrenal gland is not entirely included  within the field of view. Degenerative changes in the spine. No destructive bone lesions appreciated. IMPRESSION: Left upper lung collapse with bronchial cut off sign and enlarged left hilar and mediastinal lymph nodes consistent with obstructing malignancy with lymph node metastasis. Electronically Signed   By: Lucienne Capers M.D.   On: 06/21/2015 23:56   I have personally reviewed and evaluated these images and lab results as part of my medical decision-making.   EKG Interpretation   Date/Time:  Friday June 21 2015 17:39:28 EST Ventricular Rate:  76 PR Interval:  156 QRS Duration: 84 QT Interval:  394 QTC Calculation: 443 R Axis:   102 Text Interpretation:  Normal sinus rhythm Rightward axis Septal infarct ,  age undetermined Abnormal ECG Confirmed by MESNER MD, Corene Cornea [phone]) on   06/21/2015 9:09:32 PM      MDM   Final diagnoses:  Malignant neoplasm of upper lobe of left lung [facility])    54 yo F with a PMH of tobacco abuse,HTN, HLD and COPD who presents with LUQ pain today. Recently had PNA and was treated. CXR at urgent care was abnormal. Sent here for further workup. CXR here with LUL collapse, no PTX. Diminished left sided breath sounds. No chest pain, no hypoxia. HDS. Getting CT chest to further evaluate.   CT concerning for malignancy with lymph node mets. Dr. Lamonte Sakai in pulmonology notified of Dx via [place]. Will provide f/u info for oncology. Patient informed about findings and counseled on follow up. She voices understanding and is agreeable with plan. No hypoxia or infection. Feel she is stable for d/c.   Discussed with Dr. Dayna Barker.  Gustavus Bryant, MD 06/22/15 1700  Merrily Pew, MD 06/22/15 [phone]

## 2015-06-21 NOTE — ED Notes (Signed)
Pt has 2 images on a CD but we cannot upload them to our system because of format.

## 2015-06-22 NOTE — Discharge Instructions (Signed)
Lung Biopsy A lung biopsy is a procedure in which a tissue sample is removed from the lung. The tissue can be examined under a microscope to help diagnose various lung disorders.  LET [facility] CARE PROVIDER KNOW ABOUT:  Any allergies you have.  All medicines you are taking, including vitamins, herbs, eye drops, creams, and over-the-counter medicines.  Previous problems you or members of your family have had with the use of anesthetics.  Any blood disorders or bleeding problems that you have.  Previous surgeries you have had.  Medical conditions you have. RISKS AND COMPLICATIONS Generally, a lung biopsy is a safe procedure. However, problems can occur and include:  Collapse of the lung.   Bleeding.   Infection.  BEFORE THE PROCEDURE  Do not eat or drink anything after midnight on the night before the procedure or as directed by your health care provider.  Ask your health care provider about changing or stopping your regular medicines. This is especially important if you are taking diabetes medicines or blood thinners.  Plan to have someone take you home after the procedure. PROCEDURE Various methods can be used to perform a lung biopsy:   Needle biopsy. A biopsy needle is inserted into the lung. The needle is used to collect the tissue sample. A CT scanner may be used to guide the needle to the right place in the lung. For this method, a medicine is used to numb the area where the biopsy sample will be taken (local anesthetic).  Bronchoscopy. A flexible tube (bronchoscope) is inserted into your lungs by going through your mouth or nose. A needle or forceps is passed through the bronchoscope to remove the tissue sample. For this method, medicine may be used to numb the back of your throat.  Open biopsy. A cut (incision) is made in your chest. The tissue sample is then removed using surgical tools. The incision is closed with skin glue, skin adhesive strips, or stitches. For  this method, you will be given medicine to make you sleep through the procedure (general anesthetic). AFTER THE PROCEDURE  Your recovery will be assessed and monitored.  You might have soreness and tenderness at the site of the biopsy for a few days after the procedure.  You might have a cough and some soreness in your throat for a few days if a bronchoscope was used.   This information is not intended to replace advice given to you by your health care provider. Make sure you discuss any questions you have with your health care provider.   Document Released: 10/01/2004 Document Revised: 08/03/2014 Document Reviewed: 12/25/2012 Elsevier Interactive Patient Education 2016 [place] Lung cancer occurs when abnormal cells in the lung grow out of control and form a mass (tumor). There are several types of lung cancer. The two most common types are:  Non-small cell. In this type of lung cancer, abnormal cells are larger and grow more slowly than those of small cell lung cancer.  Small cell. In this type of lung cancer, abnormal cells are smaller than those of non-small cell lung cancer. Small cell lung cancer gets worse faster than non-small cell lung cancer. CAUSES  The leading cause of lung cancer is smoking tobacco. The second leading cause is radon exposure. RISK FACTORS  Smoking tobacco.  Exposure to secondhand tobacco smoke.  Exposure to radon gas.  Exposure to asbestos.  Exposure to arsenic in drinking water.  Air pollution.  Family or personal history of lung  cancer.  Lung radiation therapy.  Being older than 49 years. SIGNS AND SYMPTOMS  In the early stages, symptoms may not be present. As the cancer progresses, symptoms may include:  A lasting cough, possibly with blood.  Fatigue.  Unexplained weight loss.  Shortness of breath.  Wheezing.  Chest pain.  Loss of appetite. Symptoms of advanced lung cancer include:  Hoarseness.  Bone or  joint pain.  Weakness.  Nail problems.  Face or arm swelling.  Paralysis of the face.  Drooping eyelids. DIAGNOSIS  Lung cancer can be identified with a physical exam and with tests such as:  A chest X-ray.  A CT scan.  Blood tests.  A biopsy. After a diagnosis is made, you will have more tests to determine the stage of the cancer. The stages of non-small cell lung cancer are:  Stage 0, also called carcinoma in situ. At this stage, abnormal cells are found in the inner lining of your lung or lungs.  Stage I. At this stage, abnormal cells have grown into a tumor that is no larger than 5 cm across. The cancer has entered the deeper lung tissue but has not yet entered the lymph nodes or other parts of the body.  Stage II. At this stage, the tumor is 7 cm across or smaller and has entered nearby lymph nodes. Or, the tumor is 5 cm across or smaller and has invaded surrounding tissue but is not found in nearby lymph nodes. There may be more than one tumor present.  Stage III. At this stage, the tumor may be any size. There may be more than one tumor in the lungs. The cancer cells have spread to the lymph nodes and possibly to other organs.  Stage IV. At this stage, there are tumors in both lungs and the cancer has spread to other areas of the body. The stages of small cell lung cancer are:  Limited. At this stage, the cancer is found only on one side of the chest.  Extensive. At this stage, the cancer is in the lungs and in tissues on the other side of the chest. The cancer has spread to other organs or is found in the fluid between the layers of your lungs. TREATMENT  Depending on the type and stage of your lung cancer, you may be treated with:  Surgery. This is done to remove a tumor.  Radiation therapy. This treatment destroys cancer cells using X-rays or other types of radiation.  Chemotherapy. This treatment uses medicines to destroy cancer cells.  Targeted therapy. This  treatment aims to destroy only cancer cells instead of all cells as other therapies do. You may also have a combination of treatments. HOME CARE INSTRUCTIONS   Do not use any tobacco products. This includes cigarettes, chewing tobacco, and electronic cigarettes. If you need help quitting, ask your health care provider.  Take medicines only as directed by your health care provider.  Eat a healthy diet. Work with a dietitian to make sure you are getting the nutrition you need.  Consider joining a support group or seeking counseling to help you cope with the stress of having lung cancer.  Let your cancer specialist (oncologist) know if you are admitted to the hospital.  Keep all follow-up visits as directed by your health care provider. This is important. SEEK MEDICAL CARE IF:   You lose weight without trying.  You have a persistent cough and wheezing.  You feel short of breath.  You tire easily.  You experience bone or joint pain.  You have difficulty swallowing.  You feel hoarse or notice your voice changing.  Your pain medicine is not helping. SEEK IMMEDIATE MEDICAL CARE IF:   You cough up blood.  You have new breathing problems.  You develop chest pain.  You develop swelling in:  One or both ankles or legs.  Your face, neck, or arms.  You are confused.  You experience paralysis in your face or a drooping eyelid.   This information is not intended to replace advice given to you by your health care provider. Make sure you discuss any questions you have with your health care provider.   Document Released: 10/19/2000 Document Revised: 04/03/2015 Document Reviewed: 11/16/2013 Elsevier Interactive Patient Education [employer].

## 2015-06-22 NOTE — ED Provider Notes (Signed)
I saw and evaluated the patient, reviewed the resident's note and I agree with the findings and plan.  54 year old female with a 35-pack-year smoking history here with opacity seen on chest x-ray consistent with atelectasis of the left upper lobe. Some left upper quadrant pain. Evaluation she has O2 sat above 90% rest of her vital signs within normal limits. She has diminished breath sounds bilaterally upper lobes and wheezing or lower lobes in no distress. No abdominal tenderness to palpation or rib tenderness. EKG without any significant findings. Plan is to get a CT scan to evaluate for malignancy or other causes for collapse of her left upper lobe.   EKG Interpretation   Date/Time:  Friday June 21 2015 17:39:28 EST Ventricular Rate:  76 PR Interval:  156 QRS Duration: 84 QT Interval:  394 QTC Calculation: 443 R Axis:   102 Text Interpretation:  Normal sinus rhythm Rightward axis Septal infarct ,  age undetermined Abnormal ECG Confirmed by [facility] MD, Harjit Leider [phone]) on  06/21/2015 9:09:32 PM       Merrily Pew, MD 06/22/15 8638

## 2015-06-24 ENCOUNTER — Telehealth: Payer: Self-pay | Admitting: Emergency Medicine

## 2015-06-24 ENCOUNTER — Encounter: Payer: Self-pay | Admitting: Hematology & Oncology

## 2015-06-24 ENCOUNTER — Ambulatory Visit: Payer: BLUE CROSS/BLUE SHIELD

## 2015-06-24 ENCOUNTER — Other Ambulatory Visit: Payer: BLUE CROSS/BLUE SHIELD

## 2015-06-24 ENCOUNTER — Ambulatory Visit (HOSPITAL_BASED_OUTPATIENT_CLINIC_OR_DEPARTMENT_OTHER): Payer: BLUE CROSS/BLUE SHIELD | Admitting: Hematology & Oncology

## 2015-06-24 VITALS — BP 140/70 | HR 82 | Temp 98.7°F | Resp 20 | Ht 62.0 in | Wt 165.0 lb

## 2015-06-24 DIAGNOSIS — C3402 Malignant neoplasm of left main bronchus: Secondary | ICD-10-CM

## 2015-06-24 NOTE — Telephone Encounter (Signed)
Dr. Lamonte Sakai spoke with Dr. Marin Olp.  Per RB, pt needs OV with him ASAP to discuss to bronch.  He will discuss needing bronch with her during OV.  lmomtcb for pt - RB has opening tomorrow at 12:00.  I held this space to see if it will work with pt's schedule.

## 2015-06-24 NOTE — Telephone Encounter (Signed)
Pt returned call and scheduled OV for tomorrow at 12:00 with RB.

## 2015-06-24 NOTE — Progress Notes (Signed)
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 OTHER    New Patient  : I went to the emergency room and they told me that I had lung cancer.  HPI: Carol Buchanan is a very nice 54 year old white female. She has about a 40-pack-year history of tobacco use. She stopped 4 years ago.  She was recently working for [employer]. She was having some difficulties with some left lower anterior chest wall pain. Patient ultimately went to the emergency room yesterday. She had a CT scan done. Surprisingly enough, the CT scan showed collapse of the left upper lung. She has enlarged left hilar and mediastinal lymph nodes. A left hilar lymph node measured 2.2 cm. There is no obvious mediastinal mass.  She had lab work done. This looked okay. She came to the office today to make an appointment. Thankfully, as able to see her.  She has underlying "COPD". She is not sure if this is emphysema or chronic bronchitis.  She has not had any hemoptysis. She's had no weight loss. There may be some slight weight gain. She's had no headache. She's had no change in bowel or bladder habits.  Her last mammogram was a little over a year ago. She had a colonoscopy about 2 years ago.  She has had no fever.  Overall, her performance status is ECOG 0.                         Past Medical History  Diagnosis Date  . Hypertension   . Hyperlipidemia   . Sinus trouble   . Neuropathy ([place])   . COPD (chronic obstructive pulmonary disease) ([place])   :  Past Surgical History  Procedure Laterality Date  . Tubal ligation  1994  . Back surgery  2015  :   Current outpatient prescriptions:  .  ANORO ELLIPTA 62.5-25 MCG/INH AEPB, inhale 2 puffs INTO THE LUNGS DAILY, Disp: 60 each, Rfl: 3 .  Ascorbic Acid (VITAMIN C) 1000 MG tablet, Take 1,000 mg by mouth daily., Disp: , Rfl:  .  Calcium Carbonate-Vit D-Min 1200-1000  MG-UNIT CHEW, Chew 1 tablet by mouth daily., Disp: , Rfl:  .  estradiol (VIVELLE-DOT) 0.05 MG/24HR patch, Place 1 patch onto the skin 2 (two) times a week. Tuesday & Friday, Disp: , Rfl:  .  ibuprofen (ADVIL,MOTRIN) 800 MG tablet, Take 800 mg by mouth every 8 (eight) hours as needed for moderate pain. , Disp: , Rfl:  .  meloxicam (MOBIC) 15 MG tablet, Take 15 mg by mouth daily., Disp: , Rfl:  .  omeprazole (PRILOSEC) 20 MG capsule, Take 20 mg by mouth daily., Disp: , Rfl:  .  PROAIR HFA 108 (90 BASE) MCG/ACT inhaler, inhale 1 puff every 4 hours if needed for shortness of breath, Disp: 8.5 Inhaler, Rfl: 6 .  simvastatin (ZOCOR) 20 MG tablet, Take 20 mg by mouth daily. , Disp: , Rfl:  .  venlafaxine XR (EFFEXOR-XR) 150 MG 24 hr capsule, Take 150 mg by mouth daily., Disp: , Rfl:  .  verapamil (COVERA HS) 180 MG (CO) 24 hr tablet, Take 180 mg by mouth daily. , Disp: , Rfl: :  :  No Known Allergies:  Family History  Problem Relation Age of Onset  . Heart disease Father   . Heart attack Father   . Arthritis Mother   :  Social History  Social History  . Marital Status: Single    Spouse Name: N/A  . Number of Children: 0  . Years of Education: 13   Occupational History  . unemployed    Social History Main Topics  . Smoking status: Former Smoker -- 1.50 packs/day for 35 years    Types: Cigarettes    Quit date: 01/26/2011  . Smokeless tobacco: Never Used  . Alcohol Use: 1.2 oz/week    2 Standard drinks or equivalent per week     Comment: on weekends  . Drug Use: No  . Sexual Activity: Not on file   Other Topics Concern  . Not on file   Social History Narrative   Patient lives at home with her mother.   Patient is not working   Right handed   Education- One year of college  :  Pertinent items are noted in HPI.  Exam: '@IPVITALS'$ @  well-developed and well-nourished white female in no obvious distress. Her vital signs show temperature of 98.7. Pulse 82. Blood pressure  140/70. Weight is 165 pounds. Head and neck exam shows no ocular or oral lesions. She has no palpable cervical or supraclavicular lymph nodes. Lungs are clear bilaterally. She has good air movement bilaterally. No rales, wheezes or rhonchi are noted. Cardiac exam regular rate and rhythm with a normal S1 and S2. There are no murmurs, rubs or bruits. Abdominal Xan is soft tissues good bowel sounds. There is no fluid wave. There is no palpable liver or spleen tip. Back exam shows no tenderness over the spine, ribs or hips. Extremity shows no clubbing, cyanosis or edema. She has good range motion of her joints. She has good muscle strength in her upper and lower extremity. Skin exam shows no rashes, ecchymoses or petechia. Neurological exam shows no focal neurological deficits.    Recent Labs  06/21/15 1836  WBC 7.9  HGB 12.7  HCT 38.6  PLT 263    Recent Labs  06/21/15 1836  NA 139  K 3.3*  CL 105  CO2 25  GLUCOSE 87  BUN 10  CREATININE 0.60  CALCIUM 9.4    Blood smear review:  None  Pathology: None     Assessment and Plan:  Carol Buchanan is a 54 year old white female. In all likelihood she has bronchogenic carcinoma. I cannot imagine is being anything else. I suppose lymphoma would also be a possibility although this would be highly unlikely. There is a history of tobacco use.  We will need to get her to her pulmonologist. I have spoken with Dr. Lamonte Sakai today. He will get her in to be seen.  She also needs to have a PET scan done. I revisit be very important.  I would have to say that she probably has at least stage IIb/III lung cancer. I would think that this is a non-small cell lung cancer. However, if she could also have a small cell lung cancer.  For now, I will hold off on any further studies until we get the bronchoscopy and the results back.  If the bronchoscopy is negative, then I would probably favor a mediastinoscopy.  I would think that she probably will need a  combination of radiation chemotherapy given that there is blockage of the left upper lung. If she has a small cell lung cancer, and I would probably start off with chemotherapy initially and then add radiation.  I spent an hour with she and her mom. 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  He had bladder cancer.  We will plan to get her back once we get the results back from our biopsies, and PET scan, and other lab work.

## 2015-06-25 ENCOUNTER — Encounter: Payer: Self-pay | Admitting: Emergency Medicine

## 2015-06-25 ENCOUNTER — Ambulatory Visit (INDEPENDENT_AMBULATORY_CARE_PROVIDER_SITE_OTHER): Payer: BLUE CROSS/BLUE SHIELD | Admitting: Emergency Medicine

## 2015-06-25 VITALS — BP 124/80 | HR 84 | Ht 64.0 in | Wt 164.0 lb

## 2015-06-25 DIAGNOSIS — J449 Chronic obstructive pulmonary disease, unspecified: Secondary | ICD-10-CM | POA: Diagnosis not present

## 2015-06-25 DIAGNOSIS — J9811 Atelectasis: Secondary | ICD-10-CM | POA: Diagnosis not present

## 2015-06-25 NOTE — Assessment & Plan Note (Signed)
Continue current inhaled regimen as ordered

## 2015-06-25 NOTE — Progress Notes (Signed)
Subjective:    Patient ID: Carol Buchanan, female    DOB: August 03, 1960, 54 y.o.   MRN: [id number]  HPI 54 yo former smoker, hx of HTN, hyperlipidemia, allergies. Presents today for eval of progressive exertional dyspnea. She is s/p R sgy about 6 months ago and has been very inactive and believes she is deconditioned. She hears wheezing with exertion. She still has some cough, better since she quit smoking but still does cough some, non-productive. Her GERD is controlled on omeprazole. She was on spiriva once, not sure if it helped.   ROV 12/09/12 -- returns for f/u sob. Last time we ordered PFT, treated her PND. Didn't feel any real improvement with nasonex. She has used some albuterol >> feels that it helps her. No evidence exacerbation. Finished rehab.   ROV 01/20/13 -- f/u for moderate COPD. Last time we started Spiriva as a trial > she believes that it has helped her. Exertional tolerance is improved, does still wheeze sometimes after exertion. She uses albuterol in the am, rarely needs during the day. Her cough is much improved.   ROV 07/13/13 -- hx COPD, cough. Started Spiriva in May 2014. She is scheduled for back sgy w Dr Hal Neer end of Dec. She uses ProAir bid. Her cough is much better. She hasn't been as active because she hurt her foot and now she needs back sgy.   ROV 08/16/13 -- follows for hx COPD, cough. Last time did a trial adding Dulera to Spiriva > she has noticed significant improvement. She had sgy w Dr Hal Neer, went well except for an incisional infxn, on abx.   ROV 03/28/14 -- follow up for COPD, cough. She is on [place]. She has been having more exertional SOB. Continues to go to gym, has been limited a bit by her knee. She hears wheezing w activity. She hasn't  Tried using the SABA. She has nasal congestion, has been using [place].    ROV 11/15/14 -- follow-up visit for COPD and cough. At her last visit we stopped Spiriva and Dulera to see if she would benefit from  Anoro. We also started loratadine 10 mg daily. She believes that she is better on the Anoro. She isn';t using or needing ProAir frequently.  Her GERD is controlled.   Acute OV 06/25/15 -- patient with a history of former tobacco use and COPD that we have been treating with Anoro since April 2016. She reports that she was evaluated for left anterior chest wall pain in the emergency department which prompted a CT scan of the chest on 06/21/15 that I personally reviewed. This showed left upper lobe lingular collapse with a bronchial cutoff sign and enlargement of the left hilar lymph nodes. Also noted are enlarged nodes in the anterior mediastinum and left paratracheal and pretracheal regions. She was referred immediately to see Dr Katheran Awe given the high suspicion for possible primary lung cancer. She is referred to me now urgently to consider tissue diagnosis.    PULMONARY FUNCTON TEST 12/09/2012  FVC 3.37  FEV1 2.65  FEV1/FVC 78.6  FVC  % Predicted 73  FEV % Predicted 71  FeF 25-75 3.27  FeF 25-75 % Predicted 2.46     Review of Systems  Constitutional: Negative for fever and unexpected weight change.  HENT: Negative for congestion, dental problem, ear pain, nosebleeds, postnasal drip, rhinorrhea, sinus pressure, sneezing, sore throat and trouble swallowing.   Eyes: Negative for redness and itching.  Respiratory: Positive for shortness of breath. Negative  for cough, chest tightness and wheezing.   Cardiovascular: Negative for palpitations and leg swelling.  Gastrointestinal: Negative for nausea and vomiting.  Genitourinary: Negative for dysuria.  Musculoskeletal: Negative for joint swelling.  Skin: Negative for rash.  Neurological: Negative for headaches.  Hematological: Does not bruise/bleed easily.  Psychiatric/Behavioral: Negative for dysphoric mood. The patient is not nervous/anxious.         Objective:   Physical Exam Filed Vitals:   06/25/15 1159 06/25/15 1200  BP:  124/80   Pulse:  84  Height: '5\' 4"'$  (1.626 m)   Weight: 164 lb (74.39 kg)   SpO2:  99%   Gen: Pleasant, well-nourished, in no distress,  normal affect  ENT: No lesions,  mouth clear,  oropharynx clear, no postnasal drip  Neck: No JVD, no TMG, no carotid bruits  Lungs: No use of accessory muscles, clear without rales or rhonchi  Cardiovascular: RRR, heart sounds normal, no murmur or gallops, no peripheral edema  Musculoskeletal: No deformities, no cyanosis or clubbing  Neuro: alert, non focal  Skin: Warm, no lesions or rashes    CT chest 06/21/15 --  COMPARISON: Chest radiograph 06/21/2015  FINDINGS: Collapse and consolidation of the left upper lung and left lingula. Bronchial cut off sign with low-attenuation in the left hilum and probable enlarged left hilar lymph node measuring about 2.2 cm diameter. Additional enlarged lymph nodes in the aortopulmonic window region, anterior mediastinum, left paratracheal, pretracheal, and subcarinal regions. Findings likely due to obstructing malignancy with metastatic mediastinal lymph nodes. Bronchoscopy may be useful in further evaluation if clinically indicated.  Atelectasis in the lung bases. No pleural effusions. No pneumothorax.  Normal heart size. Normal caliber thoracic aorta. Aberrant right subclavian artery. Esophagus is decompressed.  Included portions of the upper abdominal organs demonstrate diffuse fatty infiltration of the liver. No adrenal gland nodules are demonstrated although the left adrenal gland is not entirely included within the field of view. Degenerative changes in the spine. No destructive bone lesions appreciated.  IMPRESSION: Left upper lung collapse with bronchial cut off sign and enlarged left hilar and mediastinal lymph nodes consistent with obstructing malignancy with lymph node metastasis.      Assessment & Plan:  COPD (chronic obstructive pulmonary disease) Continue current inhaled  regimen as ordered  Focal atelectasis Left upper lobe and lingular collapse noted on CT scan of the chest from 06/21/15. This is almost certainly postobstructive given the appearance of a cutoff sign in the left upper lobe bronchus. The may also be some central necrosis versus tumor in the left upper lobe parenchyma. She needs bronchoscopy for tissue diagnosis and I believe we can achieve this with endobronchial biopsies. This is been scheduled for 07/01/15. A PET scan has been arranged for 07/02/15. I will have her follow with Dr Katheran Awe as soon as we get a tissue dx.

## 2015-06-25 NOTE — Assessment & Plan Note (Signed)
Left upper lobe and lingular collapse noted on CT scan of the chest from 06/21/15. This is almost certainly postobstructive given the appearance of a cutoff sign in the left upper lobe bronchus. The may also be some central necrosis versus tumor in the left upper lobe parenchyma. She needs bronchoscopy for tissue diagnosis and I believe we can achieve this with endobronchial biopsies. This is been scheduled for 07/01/15. A PET scan has been arranged for 07/02/15. I will have her follow with Dr Katheran Awe as soon as we get a tissue dx.

## 2015-06-25 NOTE — Patient Instructions (Signed)
Please continue your inhaled medications as you are taking them We will perform bronchoscopy on 07/01/15 at 1:00 PM. [facility] will be called with more details about when to arrive and other instructions Follow with Dr Lamonte Sakai next available.

## 2015-07-01 ENCOUNTER — Encounter (HOSPITAL_COMMUNITY): Payer: Self-pay | Admitting: Radiology

## 2015-07-01 ENCOUNTER — Ambulatory Visit (HOSPITAL_COMMUNITY)
Admission: RE | Admit: 2015-07-01 | Discharge: 2015-07-01 | Disposition: A | Discharge status: Home / Self Care | Payer: BLUE CROSS/BLUE SHIELD | Source: Ambulatory Visit | Attending: Emergency Medicine | Admitting: Emergency Medicine

## 2015-07-01 ENCOUNTER — Encounter (HOSPITAL_COMMUNITY)
Admission: RE | Disposition: A | Discharge status: Home / Self Care | Payer: BLUE CROSS/BLUE SHIELD | Source: Ambulatory Visit | Attending: Emergency Medicine

## 2015-07-01 DIAGNOSIS — I10 Essential (primary) hypertension: Secondary | ICD-10-CM | POA: Diagnosis not present

## 2015-07-01 DIAGNOSIS — C3412 Malignant neoplasm of upper lobe, left bronchus or lung: Secondary | ICD-10-CM | POA: Diagnosis not present

## 2015-07-01 DIAGNOSIS — Z87891 Personal history of nicotine dependence: Secondary | ICD-10-CM | POA: Insufficient documentation

## 2015-07-01 DIAGNOSIS — R918 Other nonspecific abnormal finding of lung field: Secondary | ICD-10-CM | POA: Diagnosis not present

## 2015-07-01 DIAGNOSIS — J449 Chronic obstructive pulmonary disease, unspecified: Secondary | ICD-10-CM | POA: Insufficient documentation

## 2015-07-01 DIAGNOSIS — E7800 Pure hypercholesterolemia, unspecified: Secondary | ICD-10-CM | POA: Diagnosis not present

## 2015-07-01 DIAGNOSIS — J9811 Atelectasis: Secondary | ICD-10-CM | POA: Diagnosis not present

## 2015-07-01 DIAGNOSIS — K219 Gastro-esophageal reflux disease without esophagitis: Secondary | ICD-10-CM | POA: Diagnosis not present

## 2015-07-01 HISTORY — PX: VIDEO BRONCHOSCOPY: SHX5072

## 2015-07-01 SURGERY — VIDEO BRONCHOSCOPY WITHOUT FLUORO
Anesthesia: Moderate Sedation | Laterality: Bilateral

## 2015-07-01 MED ORDER — LIDOCAINE HCL 2 % EX GEL
CUTANEOUS | Status: DC | PRN
  Administered 2015-07-01: 1

## 2015-07-01 MED ORDER — FENTANYL CITRATE (PF) 100 MCG/2ML IJ SOLN
INTRAMUSCULAR | Status: DC | PRN
  Administered 2015-07-01: 25 ug via INTRAVENOUS
  Administered 2015-07-01: 50 ug via INTRAVENOUS
  Administered 2015-07-01 (×3): 25 ug via INTRAVENOUS

## 2015-07-01 MED ORDER — MIDAZOLAM HCL 5 MG/ML IJ SOLN
INTRAMUSCULAR | Status: AC
  Filled 2015-07-01: qty 2

## 2015-07-01 MED ORDER — SODIUM CHLORIDE 0.9 % IV SOLN
INTRAVENOUS | Status: DC
  Administered 2015-07-01: 13:00:00 via INTRAVENOUS

## 2015-07-01 MED ORDER — PHENYLEPHRINE HCL 0.25 % NA SOLN
NASAL | Status: DC | PRN
  Administered 2015-07-01: 2 via NASAL

## 2015-07-01 MED ORDER — FENTANYL CITRATE (PF) 100 MCG/2ML IJ SOLN
INTRAMUSCULAR | Status: AC
  Filled 2015-07-01: qty 4

## 2015-07-01 MED ORDER — MIDAZOLAM HCL 10 MG/2ML IJ SOLN
INTRAMUSCULAR | Status: DC | PRN
  Administered 2015-07-01: 3 mg via INTRAVENOUS
  Administered 2015-07-01 (×2): 1 mg via INTRAVENOUS

## 2015-07-01 MED ORDER — LIDOCAINE HCL (PF) 1 % IJ SOLN
INTRAMUSCULAR | Status: DC | PRN
  Administered 2015-07-01: 6 mL

## 2015-07-01 NOTE — H&P (View-Only) (Signed)
Subjective:    Patient ID: Carol Buchanan, female    DOB: 09-Mar-1961, 54 y.o.   MRN: [id number]  HPI 54 yo former smoker, hx of HTN, hyperlipidemia, allergies. Presents today for eval of progressive exertional dyspnea. She is s/p R sgy about 6 months ago and has been very inactive and believes she is deconditioned. She hears wheezing with exertion. She still has some cough, better since she quit smoking but still does cough some, non-productive. Her GERD is controlled on omeprazole. She was on spiriva once, not sure if it helped.   ROV 12/09/12 -- returns for f/u sob. Last time we ordered PFT, treated her PND. Didn't feel any real improvement with nasonex. She has used some albuterol >> feels that it helps her. No evidence exacerbation. Finished rehab.   ROV 01/20/13 -- f/u for moderate COPD. Last time we started Spiriva as a trial > she believes that it has helped her. Exertional tolerance is improved, does still wheeze sometimes after exertion. She uses albuterol in the am, rarely needs during the day. Her cough is much improved.   ROV 07/13/13 -- hx COPD, cough. Started Spiriva in May 2014. She is scheduled for back sgy w Dr Hal Neer end of Dec. She uses ProAir bid. Her cough is much better. She hasn't been as active because she hurt her foot and now she needs back sgy.   ROV 08/16/13 -- follows for hx COPD, cough. Last time did a trial adding Dulera to Spiriva > she has noticed significant improvement. She had sgy w Dr Hal Neer, went well except for an incisional infxn, on abx.   ROV 03/28/14 -- follow up for COPD, cough. She is on [place]. She has been having more exertional SOB. Continues to go to gym, has been limited a bit by her knee. She hears wheezing w activity. She hasn't  Tried using the SABA. She has nasal congestion, has been using [place].    ROV 11/15/14 -- follow-up visit for COPD and cough. At her last visit we stopped Spiriva and Dulera to see if she would benefit from  Anoro. We also started loratadine 10 mg daily. She believes that she is better on the Anoro. She isn';t using or needing ProAir frequently.  Her GERD is controlled.   Acute OV 06/25/15 -- patient with a history of former tobacco use and COPD that we have been treating with Anoro since April 2016. She reports that she was evaluated for left anterior chest wall pain in the emergency department which prompted a CT scan of the chest on 06/21/15 that I personally reviewed. This showed left upper lobe lingular collapse with a bronchial cutoff sign and enlargement of the left hilar lymph nodes. Also noted are enlarged nodes in the anterior mediastinum and left paratracheal and pretracheal regions. She was referred immediately to see Dr Katheran Awe given the high suspicion for possible primary lung cancer. She is referred to me now urgently to consider tissue diagnosis.    PULMONARY FUNCTON TEST 12/09/2012  FVC 3.37  FEV1 2.65  FEV1/FVC 78.6  FVC  % Predicted 73  FEV % Predicted 71  FeF 25-75 3.27  FeF 25-75 % Predicted 2.46     Review of Systems  Constitutional: Negative for fever and unexpected weight change.  HENT: Negative for congestion, dental problem, ear pain, nosebleeds, postnasal drip, rhinorrhea, sinus pressure, sneezing, sore throat and trouble swallowing.   Eyes: Negative for redness and itching.  Respiratory: Positive for shortness of breath. Negative  for cough, chest tightness and wheezing.   Cardiovascular: Negative for palpitations and leg swelling.  Gastrointestinal: Negative for nausea and vomiting.  Genitourinary: Negative for dysuria.  Musculoskeletal: Negative for joint swelling.  Skin: Negative for rash.  Neurological: Negative for headaches.  Hematological: Does not bruise/bleed easily.  Psychiatric/Behavioral: Negative for dysphoric mood. The patient is not nervous/anxious.         Objective:   Physical Exam Filed Vitals:   06/25/15 1159 06/25/15 1200  BP:  124/80   Pulse:  84  Height: '5\' 4"'$  (1.626 m)   Weight: 164 lb (74.39 kg)   SpO2:  99%   Gen: Pleasant, well-nourished, in no distress,  normal affect  ENT: No lesions,  mouth clear,  oropharynx clear, no postnasal drip  Neck: No JVD, no TMG, no carotid bruits  Lungs: No use of accessory muscles, clear without rales or rhonchi  Cardiovascular: RRR, heart sounds normal, no murmur or gallops, no peripheral edema  Musculoskeletal: No deformities, no cyanosis or clubbing  Neuro: alert, non focal  Skin: Warm, no lesions or rashes    CT chest 06/21/15 --  COMPARISON: Chest radiograph 06/21/2015  FINDINGS: Collapse and consolidation of the left upper lung and left lingula. Bronchial cut off sign with low-attenuation in the left hilum and probable enlarged left hilar lymph node measuring about 2.2 cm diameter. Additional enlarged lymph nodes in the aortopulmonic window region, anterior mediastinum, left paratracheal, pretracheal, and subcarinal regions. Findings likely due to obstructing malignancy with metastatic mediastinal lymph nodes. Bronchoscopy may be useful in further evaluation if clinically indicated.  Atelectasis in the lung bases. No pleural effusions. No pneumothorax.  Normal heart size. Normal caliber thoracic aorta. Aberrant right subclavian artery. Esophagus is decompressed.  Included portions of the upper abdominal organs demonstrate diffuse fatty infiltration of the liver. No adrenal gland nodules are demonstrated although the left adrenal gland is not entirely included within the field of view. Degenerative changes in the spine. No destructive bone lesions appreciated.  IMPRESSION: Left upper lung collapse with bronchial cut off sign and enlarged left hilar and mediastinal lymph nodes consistent with obstructing malignancy with lymph node metastasis.      Assessment & Plan:  COPD (chronic obstructive pulmonary disease) Continue current inhaled  regimen as ordered  Focal atelectasis Left upper lobe and lingular collapse noted on CT scan of the chest from 06/21/15. This is almost certainly postobstructive given the appearance of a cutoff sign in the left upper lobe bronchus. The may also be some central necrosis versus tumor in the left upper lobe parenchyma. She needs bronchoscopy for tissue diagnosis and I believe we can achieve this with endobronchial biopsies. This is been scheduled for 07/01/15. A PET scan has been arranged for 07/02/15. I will have her follow with Dr Katheran Awe as soon as we get a tissue dx.

## 2015-07-01 NOTE — Interval H&P Note (Signed)
PCCM Interval Note  Pt presents for FOB, biopsies. Procedure explained. No barriers identified. Safe to proceed, pt elects to proceed.   Baltazar Apo, MD, PhD 07/01/2015, 1:03 PM [facility] [phone] or if no answer [phone]

## 2015-07-01 NOTE — Progress Notes (Signed)
Video bronchoscopy with washing intervention, brushing intervention, biospy intervention. All vitals good thru out procedure. Report given to endo nurse

## 2015-07-01 NOTE — Op Note (Signed)
Video Bronchoscopy Procedure Note  Date of Operation: 07/01/2015  Pre-op Diagnosis: LUL mass  Post-op Diagnosis: Same  Surgeon: Baltazar Apo  Assistants: none  Anesthesia: conscious sedation, moderate sedation  Meds Given: fentanyl 152mg, versed '5mg'$  in divided doses, epi 1:10000 dil 8cc total, 1% lidocaine 25cc total  Operation: Flexible video fiberoptic bronchoscopy and biopsies.  Estimated Blood Loss: 135WS Complications: none noted  Indications and History: Carol RAMAKERis 54y.o. with history of tobacco use, found to have a LUL obstructing lesion on CT chest.  Recommendation was to perform video fiberoptic bronchoscopy with biopsies. The risks, benefits, complications, treatment options and expected outcomes were discussed with the patient.  The possibilities of pneumothorax, pneumonia, reaction to medication, pulmonary aspiration, perforation of a viscus, bleeding, failure to diagnose a condition and creating a complication requiring transfusion or operation were discussed with the patient who freely signed the consent.    Description of Procedure: The patient was seen in the Preoperative Area, was examined and was deemed appropriate to proceed.  [facility] as [facility]and the procedure verified as Flexible Video Fiberoptic Bronchoscopy.  A Time Out was held and the above information confirmed.   Conscious sedation was initiated as indicated above. The video fiberoptic bronchoscope was introduced via the R nare and a general inspection was performed which showed normal cords. There were bilateral small hypopigmented raised lesions in teh posterior pharynx to the L and R of the glottis respectively. These were not sampled. The cords were passed and showed normal trachea, normal main carina. The R sided airways were inspected and showed normal RUL, BI, RML and RLL. The L side was then inspected. The LLL airways were normal. The  LUL airway was narrowed and occluded by an apparent mass. None of the LUL segmental airways could be seen. 1:10000 dilution epi was instilled onto the lesion in divided doses. Endobronchial brushings, biopsies and washings were collected for cytology and pathology.  The patient tolerated the procedure well. The bronchoscope was removed. There were no obvious complications.   Samples: 1. Endobronchial brushings from LUL 2. Endobronchial forceps biopsies from LUL 3. Bronchial washings from LUL  Plans:  We will review the cytology, pathology and microbiology results with the patient when they become available.  Outpatient followup will be with Dr BLamonte Sakai    RBaltazar Apo MD, PhD 07/01/2015, 2:22 PM [facility] [phone] if no answer [phone]

## 2015-07-01 NOTE — Discharge Instructions (Signed)
Flexible Bronchoscopy, Care After Refer to this sheet in the next few weeks. These instructions provide you with information on caring for yourself after your procedure. Your health care provider may also give you more specific instructions. Your treatment has been planned according to current medical practices, but problems sometimes occur. Call your health care provider if you have any problems or questions after your procedure.  WHAT TO EXPECT AFTER THE PROCEDURE It is normal to have the following symptoms for 24-48 hours after the procedure:   Increased cough.  Low-grade fever.  Sore throat or hoarse voice.  Small streaks of blood in your thick spit (sputum) if tissue samples were taken (biopsy). HOME CARE INSTRUCTIONS   Do not eat or drink anything for 2 hours after your procedure. Your nose and throat were numbed by medicine. If you try to eat or drink before the medicine wears off, food or drink could go into your lungs or you could burn yourself. After the numbness is gone and your cough and gag reflexes have returned, you may eat soft food and drink liquids slowly.   The day after the procedure, you can go back to your normal diet.   You may resume normal activities.   Keep all follow-up visits as directed by your health care provider. It is important to keep all your appointments, especially if tissue samples were taken for testing (biopsy). SEEK IMMEDIATE MEDICAL CARE IF:   You have increasing shortness of breath.   You become light-headed or faint.   You have chest pain.   You have any new concerning symptoms.  You cough up more than a small amount of blood.  The amount of blood you cough up increases. MAKE SURE YOU:  Understand these instructions.  Will watch your condition.  Will get help right away if you are not doing well or get worse.   This information is not intended to replace advice given to you by your health care provider. Make sure you discuss  any questions you have with your health care provider.   Document Released: 01/30/2005 Document Revised: 08/03/2014 Document Reviewed: 03/17/2013 Elsevier Interactive Patient Education [employer].

## 2015-07-01 NOTE — Progress Notes (Signed)
sw Dr. Lamonte Sakai, discussed pt A&O x 4, denies SOB, O2 sat titrated slowly over last hour secondary to drop in sats each time reduced.  Pt on RA with range of sats 87-96%.  When pt is resting without taking deep breaths O2 sat stays around 88-90%.  Per MD, pt can discharge home as most likely pt baseline.

## 2015-07-02 ENCOUNTER — Encounter (HOSPITAL_COMMUNITY)
Admission: RE | Admit: 2015-07-02 | Discharge: 2015-07-02 | Disposition: A | Discharge status: Home / Self Care | Payer: BLUE CROSS/BLUE SHIELD | Source: Ambulatory Visit | Attending: Hematology & Oncology | Admitting: Hematology & Oncology

## 2015-07-02 ENCOUNTER — Encounter (HOSPITAL_COMMUNITY): Payer: Self-pay | Admitting: Emergency Medicine

## 2015-07-02 DIAGNOSIS — C3402 Malignant neoplasm of left main bronchus: Secondary | ICD-10-CM | POA: Diagnosis present

## 2015-07-02 DIAGNOSIS — C7951 Secondary malignant neoplasm of bone: Secondary | ICD-10-CM | POA: Diagnosis not present

## 2015-07-02 DIAGNOSIS — C771 Secondary and unspecified malignant neoplasm of intrathoracic lymph nodes: Secondary | ICD-10-CM | POA: Insufficient documentation

## 2015-07-02 DIAGNOSIS — S2231XA Fracture of one rib, right side, initial encounter for closed fracture: Secondary | ICD-10-CM | POA: Insufficient documentation

## 2015-07-02 DIAGNOSIS — X58XXXA Exposure to other specified factors, initial encounter: Secondary | ICD-10-CM | POA: Insufficient documentation

## 2015-07-02 LAB — GLUCOSE, CAPILLARY: Glucose-Capillary: 93 mg/dL (ref 65–99)

## 2015-07-02 MED ORDER — FLUDEOXYGLUCOSE F - 18 (FDG) INJECTION
9.6000 | Freq: Once | INTRAVENOUS | Status: AC | PRN
  Administered 2015-07-02: 9.6 via INTRAVENOUS

## 2015-07-03 ENCOUNTER — Telehealth: Payer: Self-pay | Admitting: Emergency Medicine

## 2015-07-03 NOTE — Telephone Encounter (Signed)
I reviewed results with Dr Katheran Awe. He will arrange for f/u visit for Ms Schnake in his clinic.

## 2015-07-03 NOTE — Telephone Encounter (Signed)
Spoke with the patient today regarding lung bx's. 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 I have asked that the remaining tissue be sent for [facility] molecular testing. She understands the results, wants me to notify Dr Katheran Awe to guide next steps.   [facility], [phone], left a message with his RN to contact me.

## 2015-07-04 ENCOUNTER — Ambulatory Visit: Payer: BLUE CROSS/BLUE SHIELD | Admitting: Emergency Medicine

## 2015-07-04 ENCOUNTER — Other Ambulatory Visit: Payer: Self-pay | Admitting: *Deleted

## 2015-07-04 DIAGNOSIS — R918 Other nonspecific abnormal finding of lung field: Secondary | ICD-10-CM

## 2015-07-05 ENCOUNTER — Other Ambulatory Visit (HOSPITAL_BASED_OUTPATIENT_CLINIC_OR_DEPARTMENT_OTHER): Payer: BLUE CROSS/BLUE SHIELD

## 2015-07-05 ENCOUNTER — Ambulatory Visit (HOSPITAL_BASED_OUTPATIENT_CLINIC_OR_DEPARTMENT_OTHER): Payer: BLUE CROSS/BLUE SHIELD | Admitting: Hematology & Oncology

## 2015-07-05 ENCOUNTER — Encounter: Payer: Self-pay | Admitting: Hematology & Oncology

## 2015-07-05 VITALS — BP 130/67 | HR 82 | Temp 97.7°F | Resp 16 | Ht 64.0 in | Wt 163.0 lb

## 2015-07-05 DIAGNOSIS — C3490 Malignant neoplasm of unspecified part of unspecified bronchus or lung: Secondary | ICD-10-CM | POA: Diagnosis not present

## 2015-07-05 DIAGNOSIS — C3492 Malignant neoplasm of unspecified part of left bronchus or lung: Secondary | ICD-10-CM

## 2015-07-05 DIAGNOSIS — Z87891 Personal history of nicotine dependence: Secondary | ICD-10-CM | POA: Diagnosis not present

## 2015-07-05 DIAGNOSIS — J984 Other disorders of lung: Secondary | ICD-10-CM

## 2015-07-05 DIAGNOSIS — R918 Other nonspecific abnormal finding of lung field: Secondary | ICD-10-CM

## 2015-07-05 LAB — COMPREHENSIVE METABOLIC PANEL
ALT: 15 U/L (ref 0–55)
AST: 21 U/L (ref 5–34)
Albumin: 4 g/dL (ref 3.5–5.0)
Alkaline Phosphatase: 99 U/L (ref 40–150)
Anion Gap: 13 mEq/L — ABNORMAL HIGH (ref 3–11)
BUN: 14.4 mg/dL (ref 7.0–26.0)
CHLORIDE: 104 meq/L (ref 98–109)
CO2: 20 mEq/L — ABNORMAL LOW (ref 22–29)
Calcium: 9.5 mg/dL (ref 8.4–10.4)
Creatinine: 0.8 mg/dL (ref 0.6–1.1)
EGFR: 86 mL/min/{1.73_m2} — ABNORMAL LOW (ref >90)
Glucose: 93 mg/dl (ref 70–140)
POTASSIUM: 4.1 meq/L (ref 3.5–5.1)
SODIUM: 137 meq/L (ref 136–145)
Total Bilirubin: 0.36 mg/dL (ref 0.20–1.20)
Total Protein: 7.6 g/dL (ref 6.4–8.3)

## 2015-07-05 LAB — CBC WITH DIFFERENTIAL (CANCER CENTER ONLY)
BASO#: 0.1 10*3/uL (ref 0.0–0.2)
BASO%: 0.8 % (ref 0.0–2.0)
EOS%: 8.6 % — AB (ref 0.0–7.0)
Eosinophils Absolute: 0.7 10*3/uL — ABNORMAL HIGH (ref 0.0–0.5)
HCT: 38.3 % (ref 34.8–46.6)
HGB: 12.9 g/dL (ref 11.6–15.9)
LYMPH#: 1.8 10*3/uL (ref 0.9–3.3)
LYMPH%: 22.9 % (ref 14.0–48.0)
MCH: 31.4 pg (ref 26.0–34.0)
MCHC: 33.7 g/dL (ref 32.0–36.0)
MCV: 93 fL (ref 81–101)
MONO#: 0.7 10*3/uL (ref 0.1–0.9)
MONO%: 9 % (ref 0.0–13.0)
NEUT%: 58.7 % (ref 39.6–80.0)
NEUTROS ABS: 4.6 10*3/uL (ref 1.5–6.5)
PLATELETS: 279 10*3/uL (ref 145–400)
RBC: 4.11 10*6/uL (ref 3.70–5.32)
RDW: 12.6 % (ref 11.1–15.7)
WBC: 7.8 10*3/uL (ref 3.9–10.0)

## 2015-07-05 NOTE — Progress Notes (Signed)
Hematology and Oncology Follow Up Visit  BAILA ROUSE [id number] Nov 30, 1960 54 y.o. 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 Schuermann is is back for follow-up. We saw her a couple weeks ago. At that point time, she had gone to the ER and was found to have a left tall or mass with postobstructive issues.  We sent her to pulmonary medicine. Dr. Lamonte Sakai went ahead and did a bronchoscopy on her. This went successfully. He got biopsies. He did find that she had occlusion of the left upper lobe bronchus.  The pathology report ([id number]) showed a poorly differentiated adenocarcinoma. I did call pathology myself. They will send the tumor offered genetic testing. They will also send off for PD-L1 testing.  We did get a PET scan on her. The PET scan, unfortunately, showed that she did have metastatic disease. She was found to have activity in the left hilum. The mass measured 4.6 x 3.1 cm. There is also ipsilateral mediastinal adenopathy. She had no adenopathy in the neck that was active. She had activity at T4 and also in the left sixth rib.  She still has a very good performance status. She is eating okay. She is not having any headache. We will have to order an MRI of the brain.  She's had no problems with bowels or bladder. She's not had any leg swelling. She's had no nausea or vomiting.  Overall, her performance status is ECOG 1.   Medications:  Current outpatient prescriptions:  .  ANORO ELLIPTA 62.5-25 MCG/INH AEPB, inhale 2 puffs INTO THE LUNGS DAILY, Disp: 60 each, Rfl: 3 .  Ascorbic Acid (VITAMIN C) 1000 MG tablet, Take 1,000 mg by mouth daily., Disp: , Rfl:  .  Calcium Carbonate-Vit D-Min 1200-1000 MG-UNIT CHEW, Chew 1 tablet by mouth daily., Disp: , Rfl:  .  estradiol (VIVELLE-DOT) 0.05 MG/24HR patch, Place 1 patch onto the skin 2 (two) times a week. Tuesday &  Friday, Disp: , Rfl:  .  ibuprofen (ADVIL,MOTRIN) 800 MG tablet, Take 800 mg by mouth every 8 (eight) hours as needed for moderate pain. , Disp: , Rfl:  .  meloxicam (MOBIC) 15 MG tablet, Take 15 mg by mouth daily., Disp: , Rfl:  .  omeprazole (PRILOSEC) 20 MG capsule, Take 20 mg by mouth daily., Disp: , Rfl:  .  PROAIR HFA 108 (90 BASE) MCG/ACT inhaler, inhale 1 puff every 4 hours if needed for shortness of breath, Disp: 8.5 Inhaler, Rfl: 6 .  simvastatin (ZOCOR) 20 MG tablet, Take 20 mg by mouth daily. , Disp: , Rfl:  .  venlafaxine XR (EFFEXOR-XR) 150 MG 24 hr capsule, Take 150 mg by mouth daily., Disp: , Rfl:  .  verapamil (COVERA HS) 180 MG (CO) 24 hr tablet, Take 180 mg by mouth daily. , Disp: , Rfl:   Allergies: No Known Allergies  Past Medical History, Surgical history, Social history, and Family History were reviewed and updated.  Review of Systems: As above  Physical Exam:  height is _0  (1.626 m) and weight is 163 lb (73.936 kg). Her oral temperature is 97.7 F (36.5 C). Her blood pressure is 130/67 and her pulse is 82. Her respiration is 16.   Wt Readings from Last 3 Encounters:  07/05/15 163 lb (73.936 kg)  07/01/15 164 lb (74.39 kg)  06/25/15 164 lb (74.39 kg)    well-developed  and well-nourished white female in no obvious distress.  Head and neck exam shows no ocular or oral lesions. She has no palpable cervical or supraclavicular lymph nodes. Lungs are clear bilaterally. She has good air movement bilaterally. No rales, wheezes or rhonchi are noted. Cardiac exam regular rate and rhythm with a normal S1 and S2. There are no murmurs, rubs or bruits. Abdominal Xan is soft tissues good bowel sounds. There is no fluid wave. There is no palpable liver or spleen tip. Back exam shows no tenderness over the spine, ribs or hips. Extremity shows no clubbing, cyanosis or edema. She has good range motion of her joints. She has good muscle strength in her upper and lower extremity. Skin  exam shows no rashes, ecchymoses or petechia. Neurological exam shows no focal neurological deficits.     ASSESSMENT:   Mrs. Bango is a very nice 54 year old white female. She has stage IV adenocarcinoma of the lung. She is a former smoker.  The real key is going to be whether or not she has a genetic mutation. I would think that given that this is poor differentiated, that she likely would not have one.  Unfortunately, this is a condition that is not curable. We can certainly treat it.  I think that given the fact that she has it in her spine, we have to focus on treating the spine first. The fact that she has a post obstructive process with a left upper lung, I think that this also will need to be treated.  I think that the first course of action would be radiation therapy. I have left a message for radiation oncology so that they will call us back so we can get her referred over for palliative radiation to the left upper mediastinum and the upper thoracic spine.  When she completes her radiation therapy, then we will decide as to how to treat her systemically. This will be based upon the genetic mutations and whether not her tumor expresses PD-L1.   Given the latest results with immunotherapy versus chemotherapy for tumors that express PD-L1, I may wish to use immunotherapy initially.  If she does not have a genetic mutation nor has expression of PD-L1, that they will have to use chemotherapy which will have a lot more side effects.  I spoke to she and her family. I explained to them that she is treatable but not curable. Treatment will certainly make her fatigued and weak. It will increase her risk of infection. I just don't see that she will be able to work.  Of note, I will also make sure that she has Xgeva. I think this will help minimize the risk of fracture.  Hopefully, she will start radiation therapy next week or so.  I spent about 45 minutes with she and her family.  Answered all their questions.  Given that this is stage IV lung cancer, I would think that her prognosis probably will be about 14-16 months. If she has a genetic mutation, then her prognosis will be better.   I will like to see her back in 3 weeks.

## 2015-07-14 ENCOUNTER — Ambulatory Visit
Admission: RE | Admit: 2015-07-14 | Discharge: 2015-07-14 | Disposition: A | Discharge status: Home / Self Care | Payer: BLUE CROSS/BLUE SHIELD | Source: Ambulatory Visit | Attending: Hematology & Oncology | Admitting: Hematology & Oncology

## 2015-07-14 DIAGNOSIS — C3492 Malignant neoplasm of unspecified part of left bronchus or lung: Secondary | ICD-10-CM

## 2015-07-14 MED ORDER — GADOBENATE DIMEGLUMINE 529 MG/ML IV SOLN
15.0000 mL | Freq: Once | INTRAVENOUS | Status: AC | PRN
  Administered 2015-07-14: 15 mL via INTRAVENOUS

## 2015-07-15 ENCOUNTER — Telehealth: Payer: Self-pay | Admitting: *Deleted

## 2015-07-15 NOTE — Telephone Encounter (Addendum)
Patient aware of results.   ----- Message from Volanda Napoleon, MD sent at 07/15/2015  7:34 AM EST ----- Call - MRI of the brain is normal!!  pete

## 2015-07-16 ENCOUNTER — Other Ambulatory Visit: Payer: Self-pay | Admitting: *Deleted

## 2015-07-16 DIAGNOSIS — R918 Other nonspecific abnormal finding of lung field: Secondary | ICD-10-CM

## 2015-07-25 ENCOUNTER — Encounter (HOSPITAL_COMMUNITY): Payer: Self-pay

## 2015-07-25 ENCOUNTER — Other Ambulatory Visit: Payer: Self-pay

## 2015-07-25 DIAGNOSIS — R918 Other nonspecific abnormal finding of lung field: Secondary | ICD-10-CM

## 2015-07-26 ENCOUNTER — Ambulatory Visit (HOSPITAL_BASED_OUTPATIENT_CLINIC_OR_DEPARTMENT_OTHER): Payer: BLUE CROSS/BLUE SHIELD | Admitting: Hematology & Oncology

## 2015-07-26 ENCOUNTER — Encounter: Payer: Self-pay | Admitting: Hematology & Oncology

## 2015-07-26 ENCOUNTER — Other Ambulatory Visit (HOSPITAL_BASED_OUTPATIENT_CLINIC_OR_DEPARTMENT_OTHER): Payer: BLUE CROSS/BLUE SHIELD

## 2015-07-26 VITALS — BP 139/73 | HR 81 | Temp 97.4°F | Resp 16 | Ht 64.0 in | Wt 167.0 lb

## 2015-07-26 DIAGNOSIS — C3491 Malignant neoplasm of unspecified part of right bronchus or lung: Secondary | ICD-10-CM

## 2015-07-26 DIAGNOSIS — R918 Other nonspecific abnormal finding of lung field: Secondary | ICD-10-CM

## 2015-07-26 DIAGNOSIS — C3490 Malignant neoplasm of unspecified part of unspecified bronchus or lung: Secondary | ICD-10-CM

## 2015-07-26 HISTORY — DX: Malignant neoplasm of unspecified part of unspecified bronchus or lung: C34.90

## 2015-07-26 LAB — CBC WITH DIFFERENTIAL (CANCER CENTER ONLY)
BASO#: 0.1 10*3/uL (ref 0.0–0.2)
BASO%: 0.7 % (ref 0.0–2.0)
EOS ABS: 0.6 10*3/uL — AB (ref 0.0–0.5)
EOS%: 5.6 % (ref 0.0–7.0)
HCT: 39.3 % (ref 34.8–46.6)
HEMOGLOBIN: 13.3 g/dL (ref 11.6–15.9)
LYMPH#: 2.2 10*3/uL (ref 0.9–3.3)
LYMPH%: 20.3 % (ref 14.0–48.0)
MCH: 31.5 pg (ref 26.0–34.0)
MCHC: 33.8 g/dL (ref 32.0–36.0)
MCV: 93 fL (ref 81–101)
MONO#: 0.9 10*3/uL (ref 0.1–0.9)
MONO%: 8.6 % (ref 0.0–13.0)
NEUT%: 64.8 % (ref 39.6–80.0)
NEUTROS ABS: 7 10*3/uL — AB (ref 1.5–6.5)
Platelets: 284 10*3/uL (ref 145–400)
RBC: 4.22 10*6/uL (ref 3.70–5.32)
RDW: 13.1 % (ref 11.1–15.7)
WBC: 10.9 10*3/uL — AB (ref 3.9–10.0)

## 2015-07-26 LAB — COMPREHENSIVE METABOLIC PANEL
ALBUMIN: 4.2 g/dL (ref 3.5–5.0)
ALK PHOS: 120 U/L (ref 40–150)
ALT: 28 U/L (ref 0–55)
AST: 24 U/L (ref 5–34)
Anion Gap: 11 mEq/L (ref 3–11)
BUN: 20.6 mg/dL (ref 7.0–26.0)
CALCIUM: 9.3 mg/dL (ref 8.4–10.4)
CO2: 21 mEq/L — ABNORMAL LOW (ref 22–29)
CREATININE: 0.8 mg/dL (ref 0.6–1.1)
Chloride: 102 mEq/L (ref 98–109)
EGFR: 81 mL/min/{1.73_m2} — ABNORMAL LOW (ref >90)
GLUCOSE: 89 mg/dL (ref 70–140)
POTASSIUM: 4.9 meq/L (ref 3.5–5.1)
SODIUM: 135 meq/L — AB (ref 136–145)
Total Bilirubin: 0.45 mg/dL (ref 0.20–1.20)
Total Protein: 7.9 g/dL (ref 6.4–8.3)

## 2015-07-26 NOTE — Progress Notes (Signed)
Hematology and Oncology Follow Up Visit  Carol Buchanan [id number] Dec 18, 1960 54 y.o. 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 For some reason, she still has not started radiation therapy area did I spoke with the radiation oncologist a couple weeks ago. She has postobstructive process in the left upper lung. She has a lesion at T4 area and she was supposed to have started radiation by now. I'm not sure as to why the radiation is not started. She apparently has an appointment January 12. I will have to move this up.  She actually feels pretty good. She has a little bit of a cough. She's had no hemoptysis. She's had no back pain. She's had some left anterior lower rib pain.  She's had no fever or sweats. Her appetite is doing quite well. She's gained some weight.  We did go ahead and get an MRI of her brain. She had no evidence of metastatic disease to the brain.  She's had no change in bowel or bladder habits. She's had no leg swelling.  We did he get her started on Xgeva to help with her bones.  Currently, her performance status is ECOG 1.  Medications:  Current outpatient prescriptions:  .  ANORO ELLIPTA 62.5-25 MCG/INH AEPB, inhale 2 puffs INTO THE LUNGS DAILY, Disp: 60 each, Rfl: 3 .  Ascorbic Acid (VITAMIN C) 1000 MG tablet, Take 1,000 mg by mouth daily., Disp: , Rfl:  .  Calcium Carbonate-Vit D-Min 1200-1000 MG-UNIT CHEW, Chew 1 tablet by mouth daily., Disp: , Rfl:  .  estradiol (VIVELLE-DOT) 0.05 MG/24HR patch, Place 1 patch onto the skin 2 (two) times a week. Tuesday & Friday, Disp: , Rfl:  .  ibuprofen (ADVIL,MOTRIN) 800 MG tablet, Take 800 mg by mouth every 8 (eight) hours as  needed for moderate pain. , Disp: , Rfl:  .  meloxicam (MOBIC) 15 MG tablet, Take 15 mg by mouth daily., Disp: , Rfl:  .  omeprazole (PRILOSEC) 20 MG capsule, Take 20 mg by mouth daily., Disp: , Rfl:  .  PROAIR HFA 108 (90 BASE) MCG/ACT inhaler, inhale 1 puff every 4 hours if needed for shortness of breath, Disp: 8.5 Inhaler, Rfl: 6 .  simvastatin (ZOCOR) 20 MG tablet, Take 20 mg by mouth daily. , Disp: , Rfl:  .  venlafaxine XR (EFFEXOR-XR) 150 MG 24 hr capsule, Take 150 mg by mouth daily., Disp: , Rfl:  .  verapamil (COVERA HS) 180 MG (CO) 24 hr tablet, Take 180 mg by mouth daily. , Disp: , Rfl:   Allergies: No Known Allergies  Past Medical History, Surgical history, Social history, and Family History were reviewed and updated.  Review of Systems: As above  Physical Exam:  height is 5' 4"  (1.626 m) and weight is 167 lb (75.751 kg). Her oral temperature is 97.4 F (36.3 C). Her blood pressure is 139/73 and her pulse is 81. Her respiration is 16.   Wt Readings from Last 3 Encounters:  07/26/15 167 lb (75.751 kg)  07/05/15 163 lb (73.936 kg)  07/01/15 164 lb (74.39 kg)     Well-developed and well-nourished white female in no obvious distress. Head and neck exam shows no ocular or oral lesions.  She has no palpable cervical or supraclavicular lymph nodes. Lungs are clear bilaterally. There may be some slight decrease over on the left upper lung. No wheezes are noted. Cardiac exam shows regular rate and rhythm with no murmurs, rubs or bruits. Abdomen is soft. She has good bowel sounds. There is no fluid wave. There is no palpable liver or spleen tip. Extremities shows no clubbing, cyanosis or edema. She has good range of motion of her joints. She has good strength in her upper and lower extremities Back exam shows no tenderness over the spine, ribs or hips. Neurological exam shows no focal neurological deficits. Skin exam shows no rashes, ecchymoses or petechia.  Lab Results  Component Value  Date   WBC 10.9* 07/26/2015   HGB 13.3 07/26/2015   HCT 39.3 07/26/2015   MCV 93 07/26/2015   PLT 284 07/26/2015     Chemistry      Component Value Date/Time   NA 137 07/05/2015 0827   NA 139 06/21/2015 1836   K 4.1 07/05/2015 0827   K 3.3* 06/21/2015 1836   CL 105 06/21/2015 1836   CO2 20* 07/05/2015 0827   CO2 25 06/21/2015 1836   BUN 14.4 07/05/2015 0827   BUN 10 06/21/2015 1836   CREATININE 0.8 07/05/2015 0827   CREATININE 0.60 06/21/2015 1836      Component Value Date/Time   CALCIUM 9.5 07/05/2015 0827   CALCIUM 9.4 06/21/2015 1836   ALKPHOS 99 07/05/2015 0827   AST 21 07/05/2015 0827   ALT 15 07/05/2015 0827   BILITOT 0.36 07/05/2015 0827         Impression and Plan: Carol Buchanan is a 54 year old white female with metastatic poorly differentiated carcinoma of the left on. She is wild-type for the typical genetic mutations. I'm awaiting the PD-L1 analysis.  Again, I have no clue as to why the radiation has not been started [place]. It really needs to get started. Thankfully, she i has  not had any complications from the postobstructive issue.  I am sure that she will have radiation started next week. I will have to call radiation therapy again.  As far as systemic therapy is concerned, the PD-L1 status really will be the key. I spoke with pathology and they will run this. If she take about 5 days to come back. If she is significantly positive, then we can use pembrolizumab as frontline therapy. If she is negative, then she will need systemic chemotherapy.  I will have to have her come back for the Xgeva. Her insurance needs to approve this.  I spent about 45 minutes with she and her sister. We talked about the different possibilities for therapy. Again, she looks quite good.  I will probably get her back to see me in about 3 weeks at which point she will be done with her radiation.  Volanda Napoleon, MD 12/30/20163:53 PM

## 2015-07-31 NOTE — Progress Notes (Signed)
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  Patient presented with some left lower anterior chest wall pain.  She originally though she was injured from her work.  Biopsies revealed:   07/01/15 Diagnosis BRONCHIAL WASHING LUL (SPECIMEN 1 OF 2, COLLECTED ON 07/01/2015): MALIGNANT CELLS CONSISTENT WITH ADENOCARCINOMA.  Diagnosis BRONCHIAL BRUSHING, LUL (SPECIMEN 2 OF 2 COLLECTED 07/01/2015) MALIGNANT CELLS CONSISTENT WITH ADENOCARCINOMA.  Diagnosis Endobronchial biopsy, left upper lobe - POORLY DIFFERENTIATED CARCINOMA. - SEE MICROSCOPIC DESCRIPTION.  Tobacco/Marijuana/Snuff/ETOH use: smoked 1.5 ppd for 35 years.  Quit in 2012.  Drinks 2 drinks per week. Denies marijuana and snuff use.  Past/Anticipated interventions by cardiothoracic surgery, if any: 07/01/15 -Procedure: VIDEO BRONCHOSCOPY WITHOUT FLUORO;  Surgeon: Collene Gobble, MD;  Location: [facility];  Service: Cardiopulmonary;  Laterality: Bilateral;   Past/Anticipated interventions by medical oncology, if any: Xgeva 120 mg subcutaneous every month. Systemic therapy to be determined by PD-L1 status.  Signs/Symptoms  Weight changes, if any: no  Respiratory complaints, if any: Patient has a cold.  She reports shortness of breath with a lot of physical activity.  Hemoptysis, if any: no  Pain issues, if any:  Yes - has pain in her left side which she reports is getting worse.  She is not taking pain medication.  SAFETY ISSUES:  Prior radiation? no  Pacemaker/ICD? no   Possible current pregnancy?no  Is the patient on methotrexate? no  Current Complaints / other details:  Patient is here with her mom and sister.  BP 145/80 mmHg  Pulse 90  Temp(Src) 98.1 F (36.7 C) (Oral)  Ht _0  (1.626 m)  Wt 168 lb 12.8 oz (76.567 kg)  BMI 28.96 kg/m2  SpO2 97%

## 2015-08-01 ENCOUNTER — Encounter: Payer: Self-pay | Admitting: Radiation Oncology

## 2015-08-01 ENCOUNTER — Ambulatory Visit
Admission: RE | Admit: 2015-08-01 | Discharge: 2015-08-01 | Disposition: A | Discharge status: Home / Self Care | Payer: BLUE CROSS/BLUE SHIELD | Source: Ambulatory Visit | Attending: Radiation Oncology | Admitting: Radiation Oncology

## 2015-08-01 VITALS — BP 145/80 | HR 90 | Temp 98.1°F | Ht 64.0 in | Wt 168.8 lb

## 2015-08-01 DIAGNOSIS — Z510 Encounter for antineoplastic radiation therapy: Secondary | ICD-10-CM | POA: Diagnosis not present

## 2015-08-01 DIAGNOSIS — C3490 Malignant neoplasm of unspecified part of unspecified bronchus or lung: Secondary | ICD-10-CM | POA: Insufficient documentation

## 2015-08-01 DIAGNOSIS — I10 Essential (primary) hypertension: Secondary | ICD-10-CM | POA: Insufficient documentation

## 2015-08-01 DIAGNOSIS — E785 Hyperlipidemia, unspecified: Secondary | ICD-10-CM | POA: Insufficient documentation

## 2015-08-01 DIAGNOSIS — Z79899 Other long term (current) drug therapy: Secondary | ICD-10-CM | POA: Diagnosis not present

## 2015-08-01 DIAGNOSIS — C3492 Malignant neoplasm of unspecified part of left bronchus or lung: Secondary | ICD-10-CM

## 2015-08-01 DIAGNOSIS — Z87891 Personal history of nicotine dependence: Secondary | ICD-10-CM | POA: Insufficient documentation

## 2015-08-01 DIAGNOSIS — F1010 Alcohol abuse, uncomplicated: Secondary | ICD-10-CM | POA: Insufficient documentation

## 2015-08-01 DIAGNOSIS — C7951 Secondary malignant neoplasm of bone: Secondary | ICD-10-CM | POA: Insufficient documentation

## 2015-08-01 DIAGNOSIS — C3412 Malignant neoplasm of upper lobe, left bronchus or lung: Secondary | ICD-10-CM

## 2015-08-01 NOTE — Progress Notes (Signed)
Please see the Nurse Progress Note in the MD Initial Consult Encounter for this patient. 

## 2015-08-01 NOTE — Progress Notes (Signed)
  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  NARRATIVE:  The patient was brought to the [facility].  Identity was confirmed.  All relevant records and images related to the planned course of therapy were reviewed.  The patient freely provided informed written consent to proceed with treatment after reviewing the details related to the planned course of therapy. The consent form was witnessed and verified by the simulation staff.  Then, the patient was set-up in a stable reproducible  supine position for radiation therapy.  CT images were obtained.  Surface markings were placed.  The CT images were loaded into the planning software.  Then the target and avoidance structures were contoured.  Treatment planning then occurred.  The radiation prescription was entered and confirmed.  Then, I designed and supervised the construction of a total of 6 medically necessary complex treatment devices, including a BodyFix immobilization mold custom fitted to the patient along with 5 multileaf collimators conformally shaped radiation around the treatment target while shielding critical structures such as the heart and spinal cord maximally.  I have requested : 3D Simulation  I have requested a DVH of the following structures: Left lung, right lung, spinal cord, heart, esophagus, and target.  I have ordered: dose calc./    PLAN:  The patient will receive 35 Gy in 14 fractions the left lung mass, T4 spinal metastasis and left rib metastasis.  -----------------------------------  Blair Promise, PhD, MD  This document serves as a record of services personally performed by Gery Pray, MD. It was created on his behalf by  Arlyce Harman, a trained medical scribe. The creation of this record is based on the scribe's personal observations and the provider's statements to them. This document has been checked and approved by the attending provider.

## 2015-08-01 NOTE — Progress Notes (Signed)
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  HISTORY OF PRESENT ILLNESS::Carol Buchanan is a 54 y.o. female who is seen today as courtesy of Dr. Marin Olp.  She was working for [employer]. She was having some difficulties with some left lower anterior chest wall pain. Patient ultimately went to the emergency room . She had a CT scan done on 06/21/15. CT scan showed collapse of the left upper lung. She has enlarged left hilar and mediastinal lymph nodes. A left hilar lymph node measured 2.2 cm. There is no obvious mediastinal mass.  Dr. Lamonte Sakai of pulmonary medicine performed a bronchoscopy on 07/01/15. Finding that she had occlusion of the left upper lobe bronchus. The pathology report ([id number]) showed a poorly differentiated adenocarcinoma. Biopsy was sent for genetic testing.  PET scan on 07/02/15, showed metastatic disease. She was found to have activity in the left hilum. The mass measured 4.6 x 3.1 cm. There is also ipsilateral mediastinal adenopathy. She had no adenopathy in the neck that was active. She had activity at T4 and also in the left sixth rib.   PREVIOUS RADIATION THERAPY: No  PAST MEDICAL HISTORY:  has a past medical history of Hypertension; Hyperlipidemia; Sinus trouble; Neuropathy ([place]); COPD (chronic obstructive pulmonary disease) ([place]); and Primary lung cancer with metastasis from lung to other site [facility]) (07/26/2015).    PAST SURGICAL HISTORY: Past Surgical History  Procedure Laterality Date  . Tubal ligation  1994  . Back surgery  2015  . Video bronchoscopy Bilateral 07/01/2015    Procedure: VIDEO BRONCHOSCOPY WITHOUT  FLUORO;  Surgeon: Collene Gobble, MD;  Location: [place];  Service: Cardiopulmonary;  Laterality: Bilateral;    FAMILY HISTORY: family history includes Arthritis in her mother; Heart attack in her father; Heart disease in her father.  SOCIAL HISTORY:  reports that she quit smoking about 4 years ago. Her smoking use included Cigarettes. She has a 52.5 pack-year smoking history. She has never used smokeless tobacco. She reports that she drinks about 1.2 oz of alcohol per week. She reports that she does not use illicit drugs.  ALLERGIES: Review of patient's allergies indicates no known allergies.  MEDICATIONS:  Current Outpatient Prescriptions  Medication Sig Dispense Refill  . ANORO ELLIPTA 62.5-25 MCG/INH AEPB inhale 2 puffs INTO THE LUNGS DAILY 60 each 3  . Ascorbic Acid (VITAMIN C) 1000 MG tablet Take 1,000 mg by mouth daily.    . Calcium Carbonate-Vit D-Min 1200-1000 MG-UNIT CHEW Chew 1 tablet by mouth daily.    Marland Kitchen dextromethorphan-guaiFENesin (MUCINEX DM) 30-600 MG 12hr tablet Take 1 tablet by mouth 2 (two) times daily.    Marland Kitchen estradiol (VIVELLE-DOT) 0.05 MG/24HR patch Place 1 patch onto the skin 2 (two) times a week. Tuesday & Friday    . ibuprofen (ADVIL,MOTRIN) 800 MG tablet Take 800 mg by mouth every 8 (eight) hours as needed for moderate pain.     . meloxicam (MOBIC) 15 MG tablet Take 15 mg by mouth daily.    Marland Kitchen omeprazole (PRILOSEC) 20 MG capsule Take 20 mg by mouth daily.    . simvastatin (ZOCOR) 20 MG tablet Take 20 mg by mouth daily.     Marland Kitchen  venlafaxine XR (EFFEXOR-XR) 150 MG 24 hr capsule Take 150 mg by mouth daily.    . verapamil (COVERA HS) 180 MG (CO) 24 hr tablet Take 180 mg by mouth daily.     Marland Kitchen PROAIR HFA 108 (90 BASE) MCG/ACT inhaler inhale 1 puff every 4 hours if needed for shortness of breath (Patient not taking: Reported on 08/01/2015) 8.5 Inhaler 6   No current facility-administered medications for this encounter.    REVIEW OF SYSTEMS:  A 15 point review of systems is  documented in the electronic medical record. This was obtained by the nursing staff. However, I reviewed this with the patient to discuss relevant findings and make appropriate changes. The patient is accompanied by her sister and mother today. Patient has a history of COPD, she does not use oxygen but does use inhalers. The patient believes she has a bruised or cracked rib as her pain continues. Denies upper back pain. Reports a history of lower back pain. Denies hemoptysis. Denies breathing changes, history of shortness of breath with exertion. Reports recent coughing attributed to a cold. States "I have been living off cough drops lately". Denies headaches, blurred vision, and double vision. She has not been back to work after her ER visit. She previously worked Research scientist (medical). States that she first noticed shortness of breath in the end of October, when she could hardly breath after climbing three flights of stairs. Her low anterior chest pain began Thanksgiving day. She has had a previous lower back surgery. She sees Dr. Kristie Cowman as her PCP.  PHYSICAL EXAM:  height is '5\' 4"'$  (1.626 m) and weight is 168 lb 12.8 oz (76.567 kg). Her oral temperature is 98.1 F (36.7 C). Her blood pressure is 145/80 and her pulse is 90. Her oxygen saturation is 97%.   General: Alert and oriented, in no acute distress HEENT: Head is normocephalic. Extraocular movements are intact. Oropharynx is clear. Neck: Neck is supple, no palpable cervical or supraclavicular lymphadenopathy. Heart: Regular in rate and rhythm with no murmurs, rubs, or gallops. Chest: Clear to auscultation bilaterally, with no rhonchi, wheezes, or rales. Mild tenderness on palpation of the left chest wall. Skin: No concerning lesions. Small scar about 5 cm on her lower back from previous back surgery. Musculoskeletal: symmetric strength and muscle tone throughout. Neurologic: Cranial nerves II through XII are grossly intact. No obvious  focalities. Speech is fluent. Coordination is intact. Psychiatric: Judgment and insight are intact. Affect is appropriate.  ECOG = 1  LABORATORY DATA:  Lab Results  Component Value Date   WBC 10.9* 07/26/2015   HGB 13.3 07/26/2015   HCT 39.3 07/26/2015   MCV 93 07/26/2015   PLT 284 07/26/2015   NEUTROABS 7.0* 07/26/2015   Lab Results  Component Value Date   NA 135* 07/26/2015   K 4.9 07/26/2015   CL 105 06/21/2015   CO2 21* 07/26/2015   GLUCOSE 89 07/26/2015   CREATININE 0.8 07/26/2015   CALCIUM 9.3 07/26/2015      RADIOGRAPHY: Mr Jeri Cos TG Contrast  07/14/2015  CLINICAL DATA:  New diagnosis of lung cancer. EXAM: MRI HEAD WITHOUT AND WITH CONTRAST TECHNIQUE: Multiplanar, multiecho pulse sequences of the brain and surrounding structures were obtained without and with intravenous contrast. CONTRAST:  18m MULTIHANCE GADOBENATE DIMEGLUMINE 529 MG/ML IV SOLN COMPARISON:  MRI brain 10/23/2011. FINDINGS: No acute infarct, hemorrhage, or mass lesion is present. No significant extraaxial fluid collection is present. The ventricles are of normal size. No significant white matter  disease is present. The internal auditory canals are within normal limits bilaterally. The brainstem and cerebellum are normal. Flow is present in the major intracranial arteries. The globes and orbits are intact. There is mild mucosal thickening in the anterior paranasal sinuses. The mastoid air cells are clear. The postcontrast images demonstrate no pathologic enhancement. The skullbase is normal. Midline sagittal images demonstrate mild degenerative changes in the cervical spine. IMPRESSION: 1. Normal MRI appearance the brain for age. No evidence for metastatic disease to the brain or meninges. 2. Minimal anterior paranasal sinus disease. 3. Mild degenerative changes in the upper cervical spine. Electronically Signed   By: San Morelle M.D.   On: 07/14/2015 16:39      IMPRESSION: Stage IV adenocarcinoma of  the lung with  metastasis to the T4 vertebral body and left rib metastasis. Patient also has left upper lobe collapse from bronchial obstruction.  The patient would be at risk for postobstructive pneumonia in light of this situation. She would be a good candidate for palliative radiation therapy directed at the chest, upper thoracic spine, and rib metastasis. Patient seems to be most symptomatic from her rib metastasis.   PLAN: I have discussed the benefits and risks of radiotherapy at length with the patient and her family. The patient will proceed with simulation and treatment planning later today. The patient is scheduled to begin radiation treatment next week.       ------------------------------------------------  Blair Promise, PhD, MD   This document serves as a record of services personally performed by Gery Pray, MD. It was created on his behalf by Arlyce Harman, a trained medical scribe. The creation of this record is based on the scribe's personal observations and the provider's statements to them. This document has been checked and approved by the attending provider.

## 2015-08-02 ENCOUNTER — Ambulatory Visit (HOSPITAL_BASED_OUTPATIENT_CLINIC_OR_DEPARTMENT_OTHER): Payer: BLUE CROSS/BLUE SHIELD

## 2015-08-02 ENCOUNTER — Telehealth: Payer: Self-pay | Admitting: Hematology & Oncology

## 2015-08-02 VITALS — BP 150/71 | HR 75 | Temp 98.5°F | Resp 20

## 2015-08-02 DIAGNOSIS — C3491 Malignant neoplasm of unspecified part of right bronchus or lung: Secondary | ICD-10-CM

## 2015-08-02 DIAGNOSIS — C7951 Secondary malignant neoplasm of bone: Secondary | ICD-10-CM

## 2015-08-02 MED ORDER — DENOSUMAB 120 MG/1.7ML ~~LOC~~ SOLN
120.0000 mg | Freq: Once | SUBCUTANEOUS | Status: AC
  Administered 2015-08-02: 120 mg via SUBCUTANEOUS
  Filled 2015-08-02: qty 1.7

## 2015-08-02 NOTE — Patient Instructions (Signed)
Denosumab injection  What is this medicine?  DENOSUMAB (den oh sue mab) slows bone breakdown. Prolia is used to treat osteoporosis in women after menopause and in men. Xgeva is used to prevent bone fractures and other bone problems caused by cancer bone metastases. Xgeva is also used to treat giant cell tumor of the bone.  This medicine may be used for other purposes; ask your health care provider or pharmacist if you have questions.  What should I tell my health care provider before I take this medicine?  They need to know if you have any of these conditions:  -dental disease  -eczema  -infection or history of infections  -kidney disease or on dialysis  -low blood calcium or vitamin D  -malabsorption syndrome  -scheduled to have surgery or tooth extraction  -taking medicine that contains denosumab  -thyroid or parathyroid disease  -an unusual reaction to denosumab, other medicines, foods, dyes, or preservatives  -pregnant or trying to get pregnant  -breast-feeding  How should I use this medicine?  This medicine is for injection under the skin. It is given by a health care professional in a hospital or clinic setting.  If you are getting Prolia, a special MedGuide will be given to you by the pharmacist with each prescription and refill. Be sure to read this information carefully each time.  For Prolia, talk to your pediatrician regarding the use of this medicine in children. Special care may be needed. For Xgeva, talk to your pediatrician regarding the use of this medicine in children. While this drug may be prescribed for children as young as 13 years for selected conditions, precautions do apply.  Overdosage: If you think you have taken too much of this medicine contact a poison control center or emergency room at once.  NOTE: This medicine is only for you. Do not share this medicine with others.  What if I miss a dose?  It is important not to miss your dose. Call your doctor or health care professional if you are  unable to keep an appointment.  What may interact with this medicine?  Do not take this medicine with any of the following medications:  -other medicines containing denosumab  This medicine may also interact with the following medications:  -medicines that suppress the immune system  -medicines that treat cancer  -steroid medicines like prednisone or cortisone  This list may not describe all possible interactions. Give your health care provider a list of all the medicines, herbs, non-prescription drugs, or dietary supplements you use. Also tell them if you smoke, drink alcohol, or use illegal drugs. Some items may interact with your medicine.  What should I watch for while using this medicine?  Visit your doctor or health care professional for regular checks on your progress. Your doctor or health care professional may order blood tests and other tests to see how you are doing.  Call your doctor or health care professional if you get a cold or other infection while receiving this medicine. Do not treat yourself. This medicine may decrease your body's ability to fight infection.  You should make sure you get enough calcium and vitamin D while you are taking this medicine, unless your doctor tells you not to. Discuss the foods you eat and the vitamins you take with your health care professional.  See your dentist regularly. Brush and floss your teeth as directed. Before you have any dental work done, tell your dentist you are receiving this medicine.  Do   not become pregnant while taking this medicine or for 5 months after stopping it. Women should inform their doctor if they wish to become pregnant or think they might be pregnant. There is a potential for serious side effects to an unborn child. Talk to your health care professional or pharmacist for more information.  What side effects may I notice from receiving this medicine?  Side effects that you should report to your doctor or health care professional as soon as  possible:  -allergic reactions like skin rash, itching or hives, swelling of the face, lips, or tongue  -breathing problems  -chest pain  -fast, irregular heartbeat  -feeling faint or lightheaded, falls  -fever, chills, or any other sign of infection  -muscle spasms, tightening, or twitches  -numbness or tingling  -skin blisters or bumps, or is dry, peels, or red  -slow healing or unexplained pain in the mouth or jaw  -unusual bleeding or bruising  Side effects that usually do not require medical attention (Report these to your doctor or health care professional if they continue or are bothersome.):  -muscle pain  -stomach upset, gas  This list may not describe all possible side effects. Call your doctor for medical advice about side effects. You may report side effects to FDA at 1-800-FDA-1088.  Where should I keep my medicine?  This medicine is only given in a clinic, doctor's office, or other health care setting and will not be stored at home.  NOTE: This sheet is a summary. It may not cover all possible information. If you have questions about this medicine, talk to your doctor, pharmacist, or health care provider.      2016, Elsevier/Gold Standard. (2012-01-11 12:37:47)

## 2015-08-02 NOTE — Telephone Encounter (Signed)
[employer] APPROVED   [id number] Carol Buchanan: [id number]  Valid: 07/31/2015 - 07/30/2016  Dx: W29.93   P: [phone] F: [phone]

## 2015-08-06 ENCOUNTER — Ambulatory Visit
Admission: RE | Admit: 2015-08-06 | Discharge: 2015-08-06 | Disposition: A | Discharge status: Home / Self Care | Payer: BLUE CROSS/BLUE SHIELD | Source: Ambulatory Visit | Attending: Radiation Oncology | Admitting: Radiation Oncology

## 2015-08-06 ENCOUNTER — Encounter: Payer: Self-pay | Admitting: Radiation Oncology

## 2015-08-06 ENCOUNTER — Telehealth: Payer: Self-pay | Admitting: Emergency Medicine

## 2015-08-06 VITALS — BP 145/74 | HR 77 | Temp 98.0°F | Resp 18 | Ht 64.0 in | Wt 169.9 lb

## 2015-08-06 DIAGNOSIS — C3412 Malignant neoplasm of upper lobe, left bronchus or lung: Secondary | ICD-10-CM

## 2015-08-06 DIAGNOSIS — C3492 Malignant neoplasm of unspecified part of left bronchus or lung: Secondary | ICD-10-CM

## 2015-08-06 DIAGNOSIS — Z510 Encounter for antineoplastic radiation therapy: Secondary | ICD-10-CM | POA: Diagnosis not present

## 2015-08-06 MED ORDER — RADIAPLEXRX EX GEL
Freq: Once | CUTANEOUS | Status: AC
  Administered 2015-08-06: 16:00:00 via TOPICAL

## 2015-08-06 NOTE — Progress Notes (Signed)
Pt here for patient teaching.  Pt given Radiation and You booklet, skin care instructions and Radiaplex gel. Pt reports they have not watched the Radiation Therapy Education video and has been given the link to watch the video at home. Reviewed areas of pertinence such as fatigue, skin changes, throat changes and cough . Pt able to give teach back of to pat skin and use unscented/gentle soap,apply Radiaplex bid and avoid applying anything to skin within 4 hours of treatment. Pt demonstrated understanding and verbalizes understanding of information given and will contact nursing with any questions or concerns.     Http://[email or web address]

## 2015-08-06 NOTE — Progress Notes (Signed)
  Radiation Oncology         [phone] ________________________________  Name: Carol Buchanan MRN: [id number]  Date: 08/06/2015  DOB: 10-Nov-1960  Simulation Verification Note    ICD-9-CM ICD-10-CM   1. Primary lung cancer with metastasis from lung to other site, left ([place]) 162.9 C34.92     Status: outpatient  NARRATIVE: The patient was brought to the treatment unit and placed in the planned treatment position. The clinical setup was verified. Then port films were obtained and uploaded to the radiation oncology medical record software.  The treatment beams were carefully compared against the planned radiation fields. The position location and shape of the radiation fields was reviewed. They targeted volume of tissue appears to be appropriately covered by the radiation beams. Organs at risk appear to be excluded as planned.  Based on my personal review, I approved the simulation verification. The patient's treatment will proceed as planned.  -----------------------------------  Blair Promise, PhD, MD

## 2015-08-06 NOTE — Progress Notes (Signed)
Carol Buchanan has completed 1 fraction to her chest and left rib.  She reports having pain in her left side at a 4/10.  She also reports having a productive cough and started coughing up blood last night.  She reports seeing it two times and it was a small amount.  She reports shortness of breath with exertion.  BP 145/74 mmHg  Pulse 77  Temp(Src) 98 F (36.7 C) (Oral)  Resp 18  Ht '5\' 4"'$  (1.626 m)  Wt 169 lb 14.4 oz (77.066 kg)  BMI 29.15 kg/m2  SpO2 98%

## 2015-08-06 NOTE — Progress Notes (Signed)
  Radiation Oncology         [phone] ________________________________  Name: Carol Buchanan MRN: [id number]  Date: 08/06/2015  DOB: 11/03/1960  Weekly Radiation Therapy Management    ICD-9-CM ICD-10-CM   1. Primary lung cancer with metastasis from lung to other site, left ([facility]) 162.9 C34.92      Current Dose: 2.5 Gy     Planned Dose:  35 Gy  Narrative . . . . . . . . The patient presents for routine under treatment assessment.                                   The patient is without complaint. Received her first treatment earlier today. She continues to have some pain along the left chest area. Small amount of blood-tinged sputum last night with coughing                                 Set-up films were reviewed.                                 The chart was checked. Physical Findings. . .  height is '5\' 4"'$  (1.626 m) and weight is 169 lb 14.4 oz (77.066 kg). Her oral temperature is 98 F (36.7 C). Her blood pressure is 145/74 and her pulse is 77. Her respiration is 18 and oxygen saturation is 98%. . Weight essentially stable.  No significant changes. The lungs are clear. The heart has a regular rhythm and rate. Impression . . . . . . . The patient is tolerating radiation. Plan . . . . . . . . . . . . Continue treatment as planned.  ________________________________   Blair Promise, PhD, MD

## 2015-08-06 NOTE — Telephone Encounter (Signed)
Patient called to report "coughing up blood" states that this has happened twice now; once last evening. Patient scheduled to have first radiation treatment today; instructed patient to report this to them as well. Instructed patient to call this office if problem persists or worsens.

## 2015-08-07 ENCOUNTER — Ambulatory Visit
Admission: RE | Admit: 2015-08-07 | Discharge: 2015-08-07 | Disposition: A | Discharge status: Home / Self Care | Payer: BLUE CROSS/BLUE SHIELD | Source: Ambulatory Visit | Attending: Radiation Oncology | Admitting: Radiation Oncology

## 2015-08-07 DIAGNOSIS — Z510 Encounter for antineoplastic radiation therapy: Secondary | ICD-10-CM | POA: Diagnosis not present

## 2015-08-08 ENCOUNTER — Ambulatory Visit
Admission: RE | Admit: 2015-08-08 | Discharge: 2015-08-08 | Disposition: A | Discharge status: Home / Self Care | Payer: BLUE CROSS/BLUE SHIELD | Source: Ambulatory Visit | Attending: Radiation Oncology | Admitting: Radiation Oncology

## 2015-08-08 ENCOUNTER — Ambulatory Visit: Payer: BLUE CROSS/BLUE SHIELD | Admitting: Radiation Oncology

## 2015-08-08 DIAGNOSIS — Z510 Encounter for antineoplastic radiation therapy: Secondary | ICD-10-CM | POA: Diagnosis not present

## 2015-08-09 ENCOUNTER — Ambulatory Visit
Admission: RE | Admit: 2015-08-09 | Discharge: 2015-08-09 | Disposition: A | Discharge status: Home / Self Care | Payer: BLUE CROSS/BLUE SHIELD | Source: Ambulatory Visit | Attending: Radiation Oncology | Admitting: Radiation Oncology

## 2015-08-09 DIAGNOSIS — Z510 Encounter for antineoplastic radiation therapy: Secondary | ICD-10-CM | POA: Diagnosis not present

## 2015-08-12 ENCOUNTER — Other Ambulatory Visit: Payer: Self-pay

## 2015-08-12 ENCOUNTER — Ambulatory Visit: Payer: BLUE CROSS/BLUE SHIELD

## 2015-08-12 ENCOUNTER — Ambulatory Visit
Admission: RE | Admit: 2015-08-12 | Discharge: 2015-08-12 | Disposition: A | Discharge status: Home / Self Care | Payer: BLUE CROSS/BLUE SHIELD | Source: Ambulatory Visit | Attending: Radiation Oncology | Admitting: Radiation Oncology

## 2015-08-12 DIAGNOSIS — Z510 Encounter for antineoplastic radiation therapy: Secondary | ICD-10-CM | POA: Diagnosis not present

## 2015-08-13 ENCOUNTER — Ambulatory Visit
Admission: RE | Admit: 2015-08-13 | Discharge: 2015-08-13 | Disposition: A | Discharge status: Home / Self Care | Payer: BLUE CROSS/BLUE SHIELD | Source: Ambulatory Visit | Attending: Radiation Oncology | Admitting: Radiation Oncology

## 2015-08-13 ENCOUNTER — Ambulatory Visit: Payer: BLUE CROSS/BLUE SHIELD

## 2015-08-13 ENCOUNTER — Encounter: Payer: Self-pay | Admitting: Radiation Oncology

## 2015-08-13 VITALS — BP 157/85 | HR 78 | Temp 98.1°F | Resp 18 | Ht 64.0 in | Wt 170.2 lb

## 2015-08-13 DIAGNOSIS — C7951 Secondary malignant neoplasm of bone: Secondary | ICD-10-CM

## 2015-08-13 DIAGNOSIS — Z510 Encounter for antineoplastic radiation therapy: Secondary | ICD-10-CM | POA: Diagnosis not present

## 2015-08-13 DIAGNOSIS — C3412 Malignant neoplasm of upper lobe, left bronchus or lung: Secondary | ICD-10-CM

## 2015-08-13 NOTE — Progress Notes (Signed)
Carol Buchanan has completed 6 fractions to her chest and left rip.  She reports the pain in her left rib is better.  She reports her shortness of breath is worse since starting radiation.  She reports having a frequent cough.  She denies having any hemoptysis this week.  She denies having a sore throat or trouble swallowing.  She denies having any skin irritation or fatigue.  BP 157/85 mmHg  Pulse 78  Temp(Src) 98.1 F (36.7 C) (Oral)  Resp 18  Ht '5\' 4"'$  (1.626 m)  Wt 170 lb 3.2 oz (77.202 kg)  BMI 29.20 kg/m2  SpO2 96%

## 2015-08-13 NOTE — Progress Notes (Signed)
  Radiation Oncology         [phone] ________________________________  Name: GEM CONKLE MRN: [id number]  Date: 08/13/2015  DOB: [birth year]  Weekly Radiation Therapy Management    ICD-9-CM ICD-10-CM   1. 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 Primary cancer of left upper lobe of lung ([facility]) 162.3 C34.12      Current Dose: 15 Gy     Planned Dose:  35 Gy  Narrative . . . . . . . . The patient presents for routine under treatment assessment.                                   The patient is without complaint. The patient's left rib pain seems to be better already. A little short of breath with activity                                Set-up films were reviewed.                                 The chart was checked. Physical Findings. . .  height is '5\' 4"'$  (1.626 m) and weight is 170 lb 3.2 oz (77.202 kg). Her oral temperature is 98.1 F (36.7 C). Her blood pressure is 157/85 and her pulse is 78. Her respiration is 18 and oxygen saturation is 96%. . The lungs are clear. The heart has a regular rhythm and rate. Impression . . . . . . . The patient is tolerating radiation. Plan . . . . . . . . . . . . Continue treatment as planned.  ________________________________   Blair Promise, PhD, MD

## 2015-08-14 ENCOUNTER — Ambulatory Visit: Payer: BLUE CROSS/BLUE SHIELD

## 2015-08-14 ENCOUNTER — Ambulatory Visit
Admission: RE | Admit: 2015-08-14 | Discharge: 2015-08-14 | Disposition: A | Discharge status: Home / Self Care | Payer: BLUE CROSS/BLUE SHIELD | Source: Ambulatory Visit | Attending: Radiation Oncology | Admitting: Radiation Oncology

## 2015-08-14 DIAGNOSIS — Z510 Encounter for antineoplastic radiation therapy: Secondary | ICD-10-CM | POA: Diagnosis not present

## 2015-08-15 ENCOUNTER — Ambulatory Visit
Admission: RE | Admit: 2015-08-15 | Discharge: 2015-08-15 | Disposition: A | Discharge status: Home / Self Care | Payer: BLUE CROSS/BLUE SHIELD | Source: Ambulatory Visit | Attending: Radiation Oncology | Admitting: Radiation Oncology

## 2015-08-15 ENCOUNTER — Ambulatory Visit: Payer: BLUE CROSS/BLUE SHIELD

## 2015-08-15 DIAGNOSIS — Z510 Encounter for antineoplastic radiation therapy: Secondary | ICD-10-CM | POA: Diagnosis not present

## 2015-08-16 ENCOUNTER — Ambulatory Visit: Payer: BLUE CROSS/BLUE SHIELD

## 2015-08-16 ENCOUNTER — Ambulatory Visit
Admission: RE | Admit: 2015-08-16 | Discharge: 2015-08-16 | Disposition: A | Discharge status: Home / Self Care | Payer: BLUE CROSS/BLUE SHIELD | Source: Ambulatory Visit | Attending: Radiation Oncology | Admitting: Radiation Oncology

## 2015-08-16 ENCOUNTER — Other Ambulatory Visit: Payer: BLUE CROSS/BLUE SHIELD

## 2015-08-16 ENCOUNTER — Ambulatory Visit: Payer: BLUE CROSS/BLUE SHIELD | Admitting: Hematology & Oncology

## 2015-08-16 DIAGNOSIS — Z510 Encounter for antineoplastic radiation therapy: Secondary | ICD-10-CM | POA: Diagnosis not present

## 2015-08-19 ENCOUNTER — Other Ambulatory Visit: Payer: Self-pay | Admitting: Hematology & Oncology

## 2015-08-19 ENCOUNTER — Ambulatory Visit: Payer: BLUE CROSS/BLUE SHIELD

## 2015-08-19 ENCOUNTER — Ambulatory Visit
Admission: RE | Admit: 2015-08-19 | Discharge: 2015-08-19 | Disposition: A | Discharge status: Home / Self Care | Payer: BLUE CROSS/BLUE SHIELD | Source: Ambulatory Visit | Attending: Radiation Oncology | Admitting: Radiation Oncology

## 2015-08-19 DIAGNOSIS — Z510 Encounter for antineoplastic radiation therapy: Secondary | ICD-10-CM | POA: Diagnosis not present

## 2015-08-20 ENCOUNTER — Ambulatory Visit
Admission: RE | Admit: 2015-08-20 | Discharge: 2015-08-20 | Disposition: A | Discharge status: Home / Self Care | Payer: BLUE CROSS/BLUE SHIELD | Source: Ambulatory Visit | Attending: Radiation Oncology | Admitting: Radiation Oncology

## 2015-08-20 ENCOUNTER — Encounter: Payer: Self-pay | Admitting: Radiation Oncology

## 2015-08-20 ENCOUNTER — Telehealth: Payer: Self-pay | Admitting: Hematology & Oncology

## 2015-08-20 ENCOUNTER — Ambulatory Visit: Payer: BLUE CROSS/BLUE SHIELD

## 2015-08-20 VITALS — BP 150/69 | HR 98 | Temp 98.0°F | Resp 20 | Wt 170.6 lb

## 2015-08-20 DIAGNOSIS — Z510 Encounter for antineoplastic radiation therapy: Secondary | ICD-10-CM | POA: Diagnosis not present

## 2015-08-20 DIAGNOSIS — C7951 Secondary malignant neoplasm of bone: Secondary | ICD-10-CM

## 2015-08-20 DIAGNOSIS — C3492 Malignant neoplasm of unspecified part of left bronchus or lung: Secondary | ICD-10-CM

## 2015-08-20 NOTE — Progress Notes (Signed)
  Radiation Oncology         [phone] ________________________________  Name: Carol Buchanan MRN: [id number]  Date: 08/20/2015  DOB: 10-29-1960  Weekly Radiation Therapy Management    ICD-9-CM ICD-10-CM   1. Primary lung cancer with metastasis from lung to other site, left ([facility]) 162.9 C34.92   2. 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 . . . . . . . The patient presents for routine under treatment assessment.                                   The patient is without complaint. Her pain along the left rib cage area has improved with treatments thus far.  She has minimal esophageal symptoms and does not wish to start medication for this issue.                                 Set-up films were reviewed.                                 The chart was checked. Physical Findings. . .  weight is 170 lb 9.6 oz (77.384 kg). Her oral temperature is 98 F (36.7 C). Her blood pressure is 150/69 and her pulse is 98. Her respiration is 20 and oxygen saturation is 100%. . Weight essentially stable.  No significant changes.  The lungs are clear. The heart has a regular rhythm and rate Impression . . . . . . . The patient is tolerating radiation. Plan . . . . . . . . . . . . Continue treatment as planned.  ________________________________   Blair Promise, PhD, MD

## 2015-08-20 NOTE — Progress Notes (Signed)
Weekly rad txs chest and left rib, 11/14 completed,  occasional discomfort left side "comes and goes stated patient" noappetite good, just starting to get sore throat , no coughing or nausea, just started taking vitamin e and fish oil 3:10 PM BP 150/69 mmHg  Pulse 98  Temp(Src) 98 F (36.7 C) (Oral)  Resp 20  Wt 170 lb 9.6 oz (77.384 kg)  SpO2 100%  Wt Readings from Last 3 Encounters:  08/20/15 170 lb 9.6 oz (77.384 kg)  08/13/15 170 lb 3.2 oz (77.202 kg)  08/06/15 169 lb 14.4 oz (77.066 kg)

## 2015-08-20 NOTE — Telephone Encounter (Signed)
[place]: [phone] Keytruda   Valid: 08/20/2015 - 08/19/2016  Dx: 34.92   P: [phone] F: [phone]    COPY SCANNED

## 2015-08-21 ENCOUNTER — Ambulatory Visit: Payer: BLUE CROSS/BLUE SHIELD

## 2015-08-21 ENCOUNTER — Ambulatory Visit
Admission: RE | Admit: 2015-08-21 | Discharge: 2015-08-21 | Disposition: A | Discharge status: Home / Self Care | Payer: BLUE CROSS/BLUE SHIELD | Source: Ambulatory Visit | Attending: Radiation Oncology | Admitting: Radiation Oncology

## 2015-08-21 DIAGNOSIS — Z510 Encounter for antineoplastic radiation therapy: Secondary | ICD-10-CM | POA: Diagnosis not present

## 2015-08-22 ENCOUNTER — Ambulatory Visit
Admission: RE | Admit: 2015-08-22 | Discharge: 2015-08-22 | Disposition: A | Discharge status: Home / Self Care | Payer: BLUE CROSS/BLUE SHIELD | Source: Ambulatory Visit | Attending: Radiation Oncology | Admitting: Radiation Oncology

## 2015-08-22 ENCOUNTER — Ambulatory Visit: Payer: BLUE CROSS/BLUE SHIELD

## 2015-08-22 DIAGNOSIS — Z510 Encounter for antineoplastic radiation therapy: Secondary | ICD-10-CM | POA: Diagnosis not present

## 2015-08-23 ENCOUNTER — Encounter: Payer: Self-pay | Admitting: Radiation Oncology

## 2015-08-23 ENCOUNTER — Ambulatory Visit
Admission: RE | Admit: 2015-08-23 | Discharge: 2015-08-23 | Disposition: A | Discharge status: Home / Self Care | Payer: BLUE CROSS/BLUE SHIELD | Source: Ambulatory Visit | Attending: Radiation Oncology | Admitting: Radiation Oncology

## 2015-08-23 ENCOUNTER — Ambulatory Visit: Payer: BLUE CROSS/BLUE SHIELD

## 2015-08-23 DIAGNOSIS — Z510 Encounter for antineoplastic radiation therapy: Secondary | ICD-10-CM | POA: Diagnosis not present

## 2015-08-26 ENCOUNTER — Ambulatory Visit: Payer: BLUE CROSS/BLUE SHIELD

## 2015-08-26 ENCOUNTER — Encounter: Payer: Self-pay | Admitting: *Deleted

## 2015-08-27 ENCOUNTER — Ambulatory Visit: Payer: BLUE CROSS/BLUE SHIELD

## 2015-08-28 ENCOUNTER — Other Ambulatory Visit: Payer: Self-pay | Admitting: *Deleted

## 2015-08-28 ENCOUNTER — Ambulatory Visit: Payer: BLUE CROSS/BLUE SHIELD

## 2015-08-28 MED ORDER — UMECLIDINIUM-VILANTEROL 62.5-25 MCG/INH IN AEPB: INHALATION_SPRAY | RESPIRATORY_TRACT | Status: DC

## 2015-08-29 ENCOUNTER — Ambulatory Visit (INDEPENDENT_AMBULATORY_CARE_PROVIDER_SITE_OTHER): Payer: BLUE CROSS/BLUE SHIELD | Admitting: Emergency Medicine

## 2015-08-29 ENCOUNTER — Ambulatory Visit: Payer: BLUE CROSS/BLUE SHIELD

## 2015-08-29 ENCOUNTER — Encounter: Payer: Self-pay | Admitting: Emergency Medicine

## 2015-08-29 VITALS — BP 120/82 | HR 84 | Ht 64.0 in | Wt 169.0 lb

## 2015-08-29 DIAGNOSIS — J302 Other seasonal allergic rhinitis: Secondary | ICD-10-CM | POA: Diagnosis not present

## 2015-08-29 DIAGNOSIS — C3412 Malignant neoplasm of upper lobe, left bronchus or lung: Secondary | ICD-10-CM

## 2015-08-29 DIAGNOSIS — J449 Chronic obstructive pulmonary disease, unspecified: Secondary | ICD-10-CM | POA: Diagnosis not present

## 2015-08-29 MED ORDER — FLUTICASONE PROPIONATE 50 MCG/ACT NA SUSP: 2.0000 | Freq: Every day | NASAL | Status: DC

## 2015-08-29 MED ORDER — TIOTROPIUM BROMIDE-OLODATEROL 2.5-2.5 MCG/ACT IN AERS: 2.0000 | INHALATION_SPRAY | Freq: Every day | RESPIRATORY_TRACT | Status: DC

## 2015-08-29 NOTE — Patient Instructions (Addendum)
We will try changing your Anoro to Stiolto 2 puffs daily We will refill your albuterol early.  Please start fluticasone nasal spray, 2 sprays each side daily  Follow with Dr Katheran Awe as planned.  Follow with Dr Lamonte Sakai in 1 month or next available.

## 2015-08-29 NOTE — Assessment & Plan Note (Signed)
Based  on her symptoms and her mucosal thickening on MRI brain I believe we need to add back her fluticasone nasal spray once a day

## 2015-08-29 NOTE — Addendum Note (Signed)
Addended by: Desmond Dike C on: 08/29/2015 02:43 PM   Modules accepted: Orders

## 2015-08-29 NOTE — Progress Notes (Signed)
Subjective:    Patient ID: Carol Buchanan, female    DOB: 25-Nov-1960, 55 y.o.   MRN: [id number]  HPI 55 yo former smoker, hx of HTN, hyperlipidemia, allergies. Presents today for eval of progressive exertional dyspnea. She is s/p R sgy about 6 months ago and has been very inactive and believes she is deconditioned. She hears wheezing with exertion. She still has some cough, better since she quit smoking but still does cough some, non-productive. Her GERD is controlled on omeprazole. She was on spiriva once, not sure if it helped.   ROV 12/09/12 -- returns for f/u sob. Last time we ordered PFT, treated her PND. Didn't feel any real improvement with nasonex. She has used some albuterol >> feels that it helps her. No evidence exacerbation. Finished rehab.   ROV 01/20/13 -- f/u for moderate COPD. Last time we started Spiriva as a trial > she believes that it has helped her. Exertional tolerance is improved, does still wheeze sometimes after exertion. She uses albuterol in the am, rarely needs during the day. Her cough is much improved.   ROV 07/13/13 -- hx COPD, cough. Started Spiriva in May 2014. She is scheduled for back sgy w Dr Hal Neer end of Dec. She uses ProAir bid. Her cough is much better. She hasn't been as active because she hurt her foot and now she needs back sgy.   ROV 08/16/13 -- follows for hx COPD, cough. Last time did a trial adding Dulera to Spiriva > she has noticed significant improvement. She had sgy w Dr Hal Neer, went well except for an incisional infxn, on abx.   ROV 03/28/14 -- follow up for COPD, cough. She is on [place]. She has been having more exertional SOB. Continues to go to gym, has been limited a bit by her knee. She hears wheezing w activity. She hasn't  Tried using the SABA. She has nasal congestion, has been using [place].    ROV 11/15/14 -- follow-up visit for COPD and cough. At her last visit we stopped Spiriva and Dulera to see if she would benefit from  Anoro. We also started loratadine 10 mg daily. She believes that she is better on the Anoro. She isn';t using or needing ProAir frequently.  Her GERD is controlled.   Acute OV 06/25/15 -- patient with a history of former tobacco use and COPD that we have been treating with Anoro since April 2016. She reports that she was evaluated for left anterior chest wall pain in the emergency department which prompted a CT scan of the chest on 06/21/15 that I personally reviewed. This showed left upper lobe lingular collapse with a bronchial cutoff sign and enlargement of the left hilar lymph nodes. Also noted are enlarged nodes in the anterior mediastinum and left paratracheal and pretracheal regions. She was referred immediately to see Dr Katheran Awe given the high suspicion for possible primary lung cancer. She is referred to me now urgently to consider tissue diagnosis.   ROV 08/29/15 -- follow-up visit for history of COPD, tobacco use and new diagnosis of non-small cell lung cancer involving the left upper lobe. She underwent bronchoscopy on 07/01/15 that confirmed poorly differentiated adenocarcinoma. She's been seen by Dr Katheran Awe, has completed a course of XRT. She is scheduled to start immunotherapy soon once approved by insurance. Her L sided chest pain is better with XRT.  She feels that she may be more SOB since last time. The Anoro has started making her cough. She is having  nasal congestion with blood secretions, no fever. Her MRI 12/18 showed some paranasal thickening.    PULMONARY FUNCTON TEST 12/09/2012  FVC 3.37  FEV1 2.65  FEV1/FVC 78.6  FVC  % Predicted 73  FEV % Predicted 71  FeF 25-75 3.27  FeF 25-75 % Predicted 2.46     Review of Systems  Constitutional: Negative for fever and unexpected weight change.  HENT: Negative for congestion, dental problem, ear pain, nosebleeds, postnasal drip, rhinorrhea, sinus pressure, sneezing, sore throat and trouble swallowing.   Eyes: Negative for redness and  itching.  Respiratory: Positive for shortness of breath. Negative for cough, chest tightness and wheezing.   Cardiovascular: Negative for palpitations and leg swelling.  Gastrointestinal: Negative for nausea and vomiting.  Genitourinary: Negative for dysuria.  Musculoskeletal: Negative for joint swelling.  Skin: Negative for rash.  Neurological: Negative for headaches.  Hematological: Does not bruise/bleed easily.  Psychiatric/Behavioral: Negative for dysphoric mood. The patient is not nervous/anxious.         Objective:   Physical Exam Filed Vitals:   08/29/15 1410  BP: 120/82  Pulse: 84  Height: '5\' 4"'$  (1.626 m)  Weight: 169 lb (76.658 kg)  SpO2: 98%   Gen: Pleasant, well-nourished, in no distress,  normal affect  ENT: No lesions,  mouth clear,  oropharynx clear, no postnasal drip  Neck: No JVD, no TMG, no carotid bruits  Lungs: No use of accessory muscles, clear without rales or rhonchi  Cardiovascular: RRR, heart sounds normal, no murmur or gallops, no peripheral edema  Musculoskeletal: No deformities, no cyanosis or clubbing  Neuro: alert, non focal  Skin: Warm, no lesions or rashes    CT chest 06/21/15 --  COMPARISON: Chest radiograph 06/21/2015  FINDINGS: Collapse and consolidation of the left upper lung and left lingula. Bronchial cut off sign with low-attenuation in the left hilum and probable enlarged left hilar lymph node measuring about 2.2 cm diameter. Additional enlarged lymph nodes in the aortopulmonic window region, anterior mediastinum, left paratracheal, pretracheal, and subcarinal regions. Findings likely due to obstructing malignancy with metastatic mediastinal lymph nodes. Bronchoscopy may be useful in further evaluation if clinically indicated.  Atelectasis in the lung bases. No pleural effusions. No pneumothorax.  Normal heart size. Normal caliber thoracic aorta. Aberrant right subclavian artery. Esophagus is  decompressed.  Included portions of the upper abdominal organs demonstrate diffuse fatty infiltration of the liver. No adrenal gland nodules are demonstrated although the left adrenal gland is not entirely included within the field of view. Degenerative changes in the spine. No destructive bone lesions appreciated.  IMPRESSION: Left upper lung collapse with bronchial cut off sign and enlarged left hilar and mediastinal lymph nodes consistent with obstructing malignancy with lymph node metastasis.      Assessment & Plan:  Primary cancer of left upper lobe of lung [facility]) Following with oncology am planning to hopefully start immunotherapy when insurance barriers have been resolved. He tolerated radiation therapy and her left-sided chest pain is improved.  COPD (chronic obstructive pulmonary disease) Anoro is making her cough. I'd like to try changing her to Stiolto to see if this formulation is better. She lost her albuterol HFA and we will refill this early so she has a replacement. Her insurance may not pay for this.   Seasonal allergies Based  on her symptoms and her mucosal thickening on MRI brain I believe we need to add back her fluticasone nasal spray once a day

## 2015-08-29 NOTE — Assessment & Plan Note (Signed)
Following with oncology am planning to hopefully start immunotherapy when insurance barriers have been resolved. He tolerated radiation therapy and her left-sided chest pain is improved.

## 2015-08-29 NOTE — Assessment & Plan Note (Signed)
Anoro is making her cough. I'd like to try changing her to Stiolto to see if this formulation is better. She lost her albuterol HFA and we will refill this early so she has a replacement. Her insurance may not pay for this.

## 2015-08-30 ENCOUNTER — Ambulatory Visit (HOSPITAL_BASED_OUTPATIENT_CLINIC_OR_DEPARTMENT_OTHER): Payer: BLUE CROSS/BLUE SHIELD

## 2015-08-30 ENCOUNTER — Ambulatory Visit (HOSPITAL_BASED_OUTPATIENT_CLINIC_OR_DEPARTMENT_OTHER): Payer: BLUE CROSS/BLUE SHIELD | Admitting: Hematology & Oncology

## 2015-08-30 ENCOUNTER — Telehealth: Payer: Self-pay | Admitting: *Deleted

## 2015-08-30 ENCOUNTER — Other Ambulatory Visit: Payer: BLUE CROSS/BLUE SHIELD

## 2015-08-30 ENCOUNTER — Encounter: Payer: Self-pay | Admitting: Hematology & Oncology

## 2015-08-30 VITALS — BP 134/77 | HR 89 | Temp 98.5°F | Resp 20 | Ht 64.0 in | Wt 170.0 lb

## 2015-08-30 DIAGNOSIS — C7951 Secondary malignant neoplasm of bone: Secondary | ICD-10-CM | POA: Diagnosis not present

## 2015-08-30 DIAGNOSIS — Z79899 Other long term (current) drug therapy: Secondary | ICD-10-CM | POA: Diagnosis not present

## 2015-08-30 DIAGNOSIS — C3492 Malignant neoplasm of unspecified part of left bronchus or lung: Secondary | ICD-10-CM

## 2015-08-30 DIAGNOSIS — Z5112 Encounter for antineoplastic immunotherapy: Secondary | ICD-10-CM

## 2015-08-30 DIAGNOSIS — C3491 Malignant neoplasm of unspecified part of right bronchus or lung: Secondary | ICD-10-CM

## 2015-08-30 DIAGNOSIS — C3412 Malignant neoplasm of upper lobe, left bronchus or lung: Secondary | ICD-10-CM

## 2015-08-30 LAB — CBC WITH DIFFERENTIAL (CANCER CENTER ONLY)
BASO#: 0 10*3/uL (ref 0.0–0.2)
BASO%: 0.5 % (ref 0.0–2.0)
EOS ABS: 0.4 10*3/uL (ref 0.0–0.5)
EOS%: 5.1 % (ref 0.0–7.0)
HEMATOCRIT: 37.9 % (ref 34.8–46.6)
HGB: 13 g/dL (ref 11.6–15.9)
LYMPH#: 0.8 10*3/uL — AB (ref 0.9–3.3)
LYMPH%: 8.9 % — ABNORMAL LOW (ref 14.0–48.0)
MCH: 31.7 pg (ref 26.0–34.0)
MCHC: 34.3 g/dL (ref 32.0–36.0)
MCV: 92 fL (ref 81–101)
MONO#: 1 10*3/uL — AB (ref 0.1–0.9)
MONO%: 11.3 % (ref 0.0–13.0)
NEUT%: 74.2 % (ref 39.6–80.0)
NEUTROS ABS: 6.4 10*3/uL (ref 1.5–6.5)
Platelets: 240 10*3/uL (ref 145–400)
RBC: 4.1 10*6/uL (ref 3.70–5.32)
RDW: 13.5 % (ref 11.1–15.7)
WBC: 8.6 10*3/uL (ref 3.9–10.0)

## 2015-08-30 LAB — CMP (CANCER CENTER ONLY)
ALK PHOS: 57 U/L (ref 26–84)
ALT: 20 U/L (ref 10–47)
AST: 21 U/L (ref 11–38)
Albumin: 3.7 g/dL (ref 3.3–5.5)
BUN, Bld: 17 mg/dL (ref 7–22)
CALCIUM: 9.5 mg/dL (ref 8.0–10.3)
CHLORIDE: 104 meq/L (ref 98–108)
CO2: 22 mEq/L (ref 18–33)
Creat: 0.7 mg/dl (ref 0.6–1.2)
GLUCOSE: 99 mg/dL (ref 73–118)
POTASSIUM: 4.4 meq/L (ref 3.3–4.7)
Sodium: 139 mEq/L (ref 128–145)
Total Bilirubin: 0.6 mg/dl (ref 0.20–1.60)
Total Protein: 7.6 g/dL (ref 6.4–8.1)

## 2015-08-30 LAB — TSH: TSH: 3.321 m(IU)/L (ref 0.308–3.960)

## 2015-08-30 MED ORDER — DENOSUMAB 120 MG/1.7ML ~~LOC~~ SOLN
120.0000 mg | Freq: Once | SUBCUTANEOUS | Status: AC
  Administered 2015-08-30: 120 mg via SUBCUTANEOUS
  Filled 2015-08-30: qty 1.7

## 2015-08-30 MED ORDER — PROCHLORPERAZINE MALEATE 10 MG PO TABS: 10.0000 mg | ORAL_TABLET | Freq: Four times a day (QID) | ORAL | Status: DC | PRN

## 2015-08-30 MED ORDER — ONDANSETRON HCL 8 MG PO TABS: 8.0000 mg | ORAL_TABLET | Freq: Two times a day (BID) | ORAL | Status: DC | PRN

## 2015-08-30 MED ORDER — SODIUM CHLORIDE 0.9 % IV SOLN
Freq: Once | INTRAVENOUS | Status: AC
  Administered 2015-08-30: 12:00:00 via INTRAVENOUS

## 2015-08-30 MED ORDER — LORAZEPAM 0.5 MG PO TABS: 0.5000 mg | ORAL_TABLET | Freq: Four times a day (QID) | ORAL | Status: DC | PRN

## 2015-08-30 MED ORDER — SODIUM CHLORIDE 0.9 % IV SOLN
200.0000 mg | Freq: Once | INTRAVENOUS | Status: AC
  Administered 2015-08-30: 200 mg via INTRAVENOUS
  Filled 2015-08-30: qty 8

## 2015-08-30 NOTE — Progress Notes (Signed)
Hematology and Oncology Follow Up Visit  Carol Buchanan [id number] 1961/02/10 55 y.o. 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 DWhiteheadis back for follow-up. She is doing quite well. She finished her radiation therapy week or so ago. She tolerated this pretty well. Her breathing has been doing nicely.  Of note, her tumor was positive for PD-L1. As such, I think that KBeryle Flockis going to be the best treatment for her.   She tolerated her radiation nicely. She's not having any chest wall pain. There is no back pain.   She's had no issues with nausea or vomiting. She's had no leg swelling. She's had no rashes.   There's been no change in bowel or bladder habits.   Currently, her performance status is ECOG 1.  Medications:  Current outpatient prescriptions:  .  Ascorbic Acid (VITAMIN C) 1000 MG tablet, Take 1,000 mg by mouth daily., Disp: , Rfl:  .  Calcium Carbonate-Vit D-Min 1200-1000 MG-UNIT CHEW, Chew 1 tablet by mouth daily., Disp: , Rfl:  .  DULoxetine (CYMBALTA) 30 MG capsule, Take 30 mg by mouth daily., Disp: , Rfl: 0 .  DULoxetine (CYMBALTA) 60 MG capsule, Take 60 mg by mouth daily. Reported on 08/06/2015, Disp: , Rfl: 0 .  fluticasone (FLONASE) 50 MCG/ACT nasal spray, Place 2 sprays into both nostrils daily., Disp: 16 g, Rfl: 2 .  ibuprofen (ADVIL,MOTRIN) 800 MG tablet, Take 800 mg by mouth every 8 (eight) hours as needed for moderate pain. , Disp: , Rfl:  .  meloxicam (MOBIC) 15 MG tablet, Take 15 mg by mouth daily., Disp: , Rfl:  .  Multiple Vitamins-Minerals (CENTRUM SILVER PO), Take by mouth., Disp: , Rfl:  .  omeprazole (PRILOSEC) 20 MG capsule, Take 20 mg by mouth daily., Disp: , Rfl:  .  PREMPRO  0.625-2.5 MG tablet, , Disp: , Rfl: 0 .  PROAIR HFA 108 (90 BASE) MCG/ACT inhaler, inhale 1 puff every 4 hours if needed for shortness of breath, Disp: 8.5 Inhaler, Rfl: 6 .  simvastatin (ZOCOR) 20 MG tablet, Take 20 mg by mouth daily. , Disp: , Rfl:  .  Tiotropium Bromide-Olodaterol (STIOLTO RESPIMAT) 2.5-2.5 MCG/ACT AERS, Inhale 2 puffs into the lungs daily., Disp: 1 Inhaler, Rfl: 5 .  Umeclidinium-Vilanterol (ANORO ELLIPTA) 62.5-25 MCG/INH AEPB, Inhale 1 puff into the lungs daily., Disp: , Rfl:  .  venlafaxine XR (EFFEXOR-XR) 150 MG 24 hr capsule, Take 150 mg by mouth daily. Reported on 08/06/2015, Disp: , Rfl:  .  verapamil (COVERA HS) 180 MG (CO) 24 hr tablet, Take 180 mg by mouth daily. , Disp: , Rfl:  .  vitamin E 400 UNIT capsule, Take 400 Units by mouth daily., Disp: , Rfl:  .  LORazepam (ATIVAN) 0.5 MG tablet, Take 1 tablet (0.5 mg total) by mouth every 6 (six) hours as needed (Nausea or vomiting)., Disp: 30 tablet, Rfl: 0 .  ondansetron (ZOFRAN) 8 MG tablet, Take 1 tablet (8 mg total) by mouth 2 (two) times daily as needed (Nausea or vomiting)., Disp: 30 tablet, Rfl: 1 .  prochlorperazine (COMPAZINE) 10 MG tablet, Take 1 tablet (10 mg total) by mouth every 6 (six) hours as needed (Nausea or vomiting)., Disp: 30 tablet, Rfl:  1 No current facility-administered medications for this visit.  Facility-Administered Medications Ordered in Other Visits:  .  0.9 %  sodium chloride infusion, , Intravenous, Once, Volanda Napoleon, MD .  pembrolizumab [facility]) 200 mg in sodium chloride 0.9 % 50 mL chemo infusion, 200 mg, Intravenous, Once, Volanda Napoleon, MD  Allergies: No Known Allergies  Past Medical History, Surgical history, Social history, and Family History were reviewed and updated.  Review of Systems: As above  Physical Exam:  height is _0  (1.626 m) and weight is 170 lb (77.111 kg). Her oral temperature is 98.5 F (36.9 C). Her blood pressure is 134/77 and her pulse is 89. Her  respiration is 20.   Wt Readings from Last 3 Encounters:  08/30/15 170 lb (77.111 kg)  08/29/15 169 lb (76.658 kg)  08/20/15 170 lb 9.6 oz (77.384 kg)     Well-developed and well-nourished white female in no obvious distress. Head and neck exam shows no ocular or oral lesions. She has no palpable cervical or supraclavicular lymph nodes. Lungs are clear bilaterally. There may be some slight decrease over on the left upper lung. No wheezes are noted. Cardiac exam shows regular rate and rhythm with no murmurs, rubs or bruits. Abdomen is soft. She has good bowel sounds. There is no fluid wave. There is no palpable liver or spleen tip. Extremities shows no clubbing, cyanosis or edema. She has good range of motion of her joints. She has good strength in her upper and lower extremities Back exam shows no tenderness over the spine, ribs or hips. Neurological exam shows no focal neurological deficits. Skin exam shows no rashes, ecchymoses or petechia.  Lab Results  Component Value Date   WBC 8.6 08/30/2015   HGB 13.0 08/30/2015   HCT 37.9 08/30/2015   MCV 92 08/30/2015   PLT 240 08/30/2015     Chemistry      Component Value Date/Time   NA 139 08/30/2015 1006   NA 135* 07/26/2015 1159   NA 139 06/21/2015 1836   K 4.4 08/30/2015 1006   K 4.9 07/26/2015 1159   K 3.3* 06/21/2015 1836   CL 104 08/30/2015 1006   CL 105 06/21/2015 1836   CO2 22 08/30/2015 1006   CO2 21* 07/26/2015 1159   CO2 25 06/21/2015 1836   BUN 17 08/30/2015 1006   BUN 20.6 07/26/2015 1159   BUN 10 06/21/2015 1836   CREATININE 0.7 08/30/2015 1006   CREATININE 0.8 07/26/2015 1159   CREATININE 0.60 06/21/2015 1836      Component Value Date/Time   CALCIUM 9.5 08/30/2015 1006   CALCIUM 9.3 07/26/2015 1159   CALCIUM 9.4 06/21/2015 1836   ALKPHOS 57 08/30/2015 1006   ALKPHOS 120 07/26/2015 1159   AST 21 08/30/2015 1006   AST 24 07/26/2015 1159   ALT 20 08/30/2015 1006   ALT 28 07/26/2015 1159   BILITOT 0.60  08/30/2015 1006   BILITOT 0.45 07/26/2015 1159         Impression and Plan: [name] is a 55 year old white female with metastatic poorly differentiated carcinoma of the left lung. She is wild-type for the typical genetic mutations.  Given that she is positive for  PD-L1, we will use Keytruda. I did this is very reasonable.   I went over the side effects with her and her mom. I think she really should do well. I cannot imagine that there would be much the way of toxicity.   I will treat her  with 3 cycles and then we will repeat her scans.   The PET scan is quite expensive so he might end of this having to do CT scans.   I spent about 45 minutes with she and her mom. I think that her chance of responding should be over 40-50%.   Her quality of life is what is most important.   Volanda Napoleon, MD 2/3/201711:41 AM

## 2015-08-30 NOTE — Telephone Encounter (Signed)
Approval Received for  INJ PEMBROLIZUMAB Procedure code: L8453 Ref# [id number] Effective dates 08/20/2015 - 08/19/2016  [employer] 

## 2015-08-30 NOTE — Patient Instructions (Signed)
Pembrolizumab injection What is this medicine? PEMBROLIZUMAB (pem broe liz ue mab) is a monoclonal antibody. It is used to treat melanoma and non-small cell lung cancer. This medicine may be used for other purposes; ask your health care provider or pharmacist if you have questions. What should I tell my health care provider before I take this medicine? They need to know if you have any of these conditions: -diabetes -immune system problems -inflammatory bowel disease -liver disease -lung or breathing disease -lupus -an unusual or allergic reaction to pembrolizumab, other medicines, foods, dyes, or preservatives -pregnant or trying to get pregnant -breast-feeding How should I use this medicine? This medicine is for infusion into a vein. It is given by a health care professional in a hospital or clinic setting. A special MedGuide will be given to you before each treatment. Be sure to read this information carefully each time. Talk to your pediatrician regarding the use of this medicine in children. Special care may be needed. Overdosage: If you think you have taken too much of this medicine contact a poison control center or emergency room at once. NOTE: This medicine is only for you. Do not share this medicine with others. What if I miss a dose? It is important not to miss your dose. Call your doctor or health care professional if you are unable to keep an appointment. What may interact with this medicine? Interactions have not been studied. Give your health care provider a list of all the medicines, herbs, non-prescription drugs, or dietary supplements you use. Also tell them if you smoke, drink alcohol, or use illegal drugs. Some items may interact with your medicine. This list may not describe all possible interactions. Give your health care provider a list of all the medicines, herbs, non-prescription drugs, or dietary supplements you use. Also tell them if you smoke, drink alcohol, or  use illegal drugs. Some items may interact with your medicine. What should I watch for while using this medicine? Your condition will be monitored carefully while you are receiving this medicine. You may need blood work done while you are taking this medicine. Do not become pregnant while taking this medicine or for 4 months after stopping it. Women should inform their doctor if they wish to become pregnant or think they might be pregnant. There is a potential for serious side effects to an unborn child. Talk to your health care professional or pharmacist for more information. Do not breast-feed an infant while taking this medicine or for 4 months after the last dose. What side effects may I notice from receiving this medicine? Side effects that you should report to your doctor or health care professional as soon as possible: -allergic reactions like skin rash, itching or hives, swelling of the face, lips, or tongue -bloody or black, tarry stools -breathing problems -change in the amount of urine -changes in vision -chest pain -chills -dark urine -dizziness or feeling faint or lightheaded -fast or irregular heartbeat -fever -flushing -hair loss -muscle pain -muscle weakness -persistent headache -signs and symptoms of high blood sugar such as dizziness; dry mouth; dry skin; fruity breath; nausea; stomach pain; increased hunger or thirst; increased urination -signs and symptoms of liver injury like dark urine, light-colored stools, loss of appetite, nausea, right upper belly pain, yellowing of the eyes or skin -stomach pain -weight loss Side effects that usually do not require medical attention (Report these to your doctor or health care professional if they continue or are bothersome.):constipation -cough -diarrhea -joint pain -  tiredness This list may not describe all possible side effects. Call your doctor for medical advice about side effects. You may report side effects to FDA at  1-800-FDA-1088. Where should I keep my medicine? This drug is given in a hospital or clinic and will not be stored at home. NOTE: This sheet is a summary. It may not cover all possible information. If you have questions about this medicine, talk to your doctor, pharmacist, or health care provider.    2016, Elsevier/Gold Standard. (2014-09-11 17:24:19)

## 2015-09-02 NOTE — Progress Notes (Signed)
  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   Indication for treatment:  Spine metastasis and rib metastasis and associated pain, bronchial obstruction       Radiation treatment dates:   08/06/2015-08/23/2015  Site/dose:   Left lung mass, T4 spinal metastasis and left rib metastasis 35 gray in 14 fractions  Beams/energy:   3-D conformal with multiple beams. The patient had 2 separate isocenters and treatment, one isocenter directed at the lung mass and T4 spinal metastasis, the second isocenter directed at the left rib metastasis  Narrative: The patient tolerated radiation treatment relatively well.   Her pain from osseous metastasis proved improved during the course of treatment  Plan: The patient has completed radiation treatment. The patient will return to radiation oncology clinic for routine followup in one month. I advised them to call or return sooner if they have any questions or concerns related to their recovery or treatment.  -----------------------------------  Blair Promise, PhD, MD

## 2015-09-10 ENCOUNTER — Telehealth: Payer: Self-pay | Admitting: Hematology & Oncology

## 2015-09-10 NOTE — Telephone Encounter (Signed)
Faxed medical records to: [facility] [place]: [address], [place] [address] P: [phone] F: [phone]   Req: 07/27/2013 to present     COPY SCANNED

## 2015-09-18 ENCOUNTER — Encounter: Payer: Self-pay | Admitting: Hematology & Oncology

## 2015-09-19 ENCOUNTER — Encounter: Payer: Self-pay | Admitting: Hematology & Oncology

## 2015-09-19 ENCOUNTER — Ambulatory Visit (HOSPITAL_BASED_OUTPATIENT_CLINIC_OR_DEPARTMENT_OTHER): Payer: BLUE CROSS/BLUE SHIELD | Admitting: Hematology & Oncology

## 2015-09-19 ENCOUNTER — Ambulatory Visit (HOSPITAL_BASED_OUTPATIENT_CLINIC_OR_DEPARTMENT_OTHER): Payer: BLUE CROSS/BLUE SHIELD

## 2015-09-19 ENCOUNTER — Other Ambulatory Visit (HOSPITAL_BASED_OUTPATIENT_CLINIC_OR_DEPARTMENT_OTHER): Payer: BLUE CROSS/BLUE SHIELD

## 2015-09-19 VITALS — BP 133/64 | HR 87 | Temp 98.0°F | Resp 16 | Ht 64.0 in | Wt 172.0 lb

## 2015-09-19 DIAGNOSIS — C3492 Malignant neoplasm of unspecified part of left bronchus or lung: Secondary | ICD-10-CM

## 2015-09-19 DIAGNOSIS — Z5112 Encounter for antineoplastic immunotherapy: Secondary | ICD-10-CM

## 2015-09-19 DIAGNOSIS — C3490 Malignant neoplasm of unspecified part of unspecified bronchus or lung: Secondary | ICD-10-CM

## 2015-09-19 LAB — CMP (CANCER CENTER ONLY)
ALBUMIN: 3.6 g/dL (ref 3.3–5.5)
ALT(SGPT): 26 U/L (ref 10–47)
AST: 30 U/L (ref 11–38)
Alkaline Phosphatase: 52 U/L (ref 26–84)
BUN, Bld: 15 mg/dL (ref 7–22)
CALCIUM: 9.2 mg/dL (ref 8.0–10.3)
CHLORIDE: 110 meq/L — AB (ref 98–108)
CO2: 20 meq/L (ref 18–33)
Creat: 0.8 mg/dl (ref 0.6–1.2)
GLUCOSE: 92 mg/dL (ref 73–118)
Potassium: 3.8 mEq/L (ref 3.3–4.7)
SODIUM: 136 meq/L (ref 128–145)
Total Bilirubin: 0.6 mg/dl (ref 0.20–1.60)
Total Protein: 7.2 g/dL (ref 6.4–8.1)

## 2015-09-19 LAB — CBC WITH DIFFERENTIAL (CANCER CENTER ONLY)
BASO#: 0 10*3/uL (ref 0.0–0.2)
BASO%: 0.6 % (ref 0.0–2.0)
EOS ABS: 0.2 10*3/uL (ref 0.0–0.5)
EOS%: 3 % (ref 0.0–7.0)
HCT: 37.4 % (ref 34.8–46.6)
HEMOGLOBIN: 12.9 g/dL (ref 11.6–15.9)
LYMPH#: 0.8 10*3/uL — ABNORMAL LOW (ref 0.9–3.3)
LYMPH%: 11.6 % — ABNORMAL LOW (ref 14.0–48.0)
MCH: 32.2 pg (ref 26.0–34.0)
MCHC: 34.5 g/dL (ref 32.0–36.0)
MCV: 93 fL (ref 81–101)
MONO#: 0.6 10*3/uL (ref 0.1–0.9)
MONO%: 8.6 % (ref 0.0–13.0)
NEUT%: 76.2 % (ref 39.6–80.0)
NEUTROS ABS: 5.5 10*3/uL (ref 1.5–6.5)
Platelets: 243 10*3/uL (ref 145–400)
RBC: 4.01 10*6/uL (ref 3.70–5.32)
RDW: 13.2 % (ref 11.1–15.7)
WBC: 7.2 10*3/uL (ref 3.9–10.0)

## 2015-09-19 MED ORDER — SODIUM CHLORIDE 0.9 % IV SOLN
200.0000 mg | Freq: Once | INTRAVENOUS | Status: AC
  Administered 2015-09-19: 200 mg via INTRAVENOUS
  Filled 2015-09-19: qty 8

## 2015-09-19 MED ORDER — SODIUM CHLORIDE 0.9 % IV SOLN
Freq: Once | INTRAVENOUS | Status: AC
  Administered 2015-09-19: 11:00:00 via INTRAVENOUS

## 2015-09-19 NOTE — Progress Notes (Signed)
Hematology and Oncology Follow Up Visit  Carol Buchanan [id number] [birth year] 55 y.o. 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 She is doing quite well. She tolerated the Keytruda quite well. She is having a little bit of diarrhea.  Her breathing is doing okay. She is not having any chest wall pain. She's not having any cough. There's no hemoptysis.  She has had no leg swelling. She has gained couple pounds. She will try to walk a little bit more.  She's had no mouth sores. She's had no visual issues. She's had no skin rashes.   Currently, her performance status is ECOG 1.  Medications:  Current outpatient prescriptions:  .  Ascorbic Acid (VITAMIN C) 1000 MG tablet, Take 1,000 mg by mouth daily., Disp: , Rfl:  .  Calcium Carbonate-Vit D-Min 1200-1000 MG-UNIT CHEW, Chew 1 tablet by mouth daily., Disp: , Rfl:  .  DULoxetine (CYMBALTA) 30 MG capsule, Take 30 mg by mouth daily., Disp: , Rfl: 0 .  DULoxetine (CYMBALTA) 60 MG capsule, Take 60 mg by mouth daily. Reported on 08/06/2015, Disp: , Rfl: 0 .  fluticasone (FLONASE) 50 MCG/ACT nasal spray, Place 2 sprays into both nostrils daily., Disp: 16 g, Rfl: 2 .  ibuprofen (ADVIL,MOTRIN) 800 MG tablet, Take 800 mg by mouth every 8 (eight) hours as needed for moderate pain. , Disp: , Rfl:  .  LORazepam (ATIVAN) 0.5 MG tablet, Take 1 tablet (0.5 mg total) by mouth every 6 (six) hours as needed (Nausea or vomiting)., Disp: 30 tablet, Rfl: 0 .  meloxicam (MOBIC) 15 MG tablet, Take 15 mg by mouth daily., Disp: , Rfl:  .  Multiple Vitamins-Minerals (CENTRUM SILVER PO), Take by mouth., Disp: , Rfl:  .  omeprazole (PRILOSEC) 20 MG capsule, Take 20 mg by mouth  daily., Disp: , Rfl:  .  ondansetron (ZOFRAN) 8 MG tablet, Take 1 tablet (8 mg total) by mouth 2 (two) times daily as needed (Nausea or vomiting)., Disp: 30 tablet, Rfl: 1 .  PREMPRO 0.625-2.5 MG tablet, , Disp: , Rfl: 0 .  PROAIR HFA 108 (90 BASE) MCG/ACT inhaler, inhale 1 puff every 4 hours if needed for shortness of breath, Disp: 8.5 Inhaler, Rfl: 6 .  prochlorperazine (COMPAZINE) 10 MG tablet, Take 1 tablet (10 mg total) by mouth every 6 (six) hours as needed (Nausea or vomiting)., Disp: 30 tablet, Rfl: 1 .  simvastatin (ZOCOR) 20 MG tablet, Take 20 mg by mouth daily. , Disp: , Rfl:  .  Tiotropium Bromide-Olodaterol (STIOLTO RESPIMAT) 2.5-2.5 MCG/ACT AERS, Inhale 2 puffs into the lungs daily., Disp: 1 Inhaler, Rfl: 5 .  Umeclidinium-Vilanterol (ANORO ELLIPTA) 62.5-25 MCG/INH AEPB, Inhale 1 puff into the lungs daily., Disp: , Rfl:  .  venlafaxine XR (EFFEXOR-XR) 150 MG 24 hr capsule, Take 150 mg by mouth daily. Reported on 08/06/2015, Disp: , Rfl:  .  verapamil (COVERA HS) 180 MG (CO) 24 hr tablet, Take 180 mg by mouth daily. , Disp: , Rfl:  .  vitamin E 400 UNIT capsule, Take 400 Units by mouth daily., Disp: , Rfl:   Allergies: No Known Allergies  Past Medical History, Surgical history, Social history, and Family History were reviewed and updated.  Review of Systems: As above  Physical Exam:  height is 5' 4"  (1.626 m) and weight is 172 lb (78.019 kg). Her oral temperature is 98 F (36.7 C). Her blood pressure is 133/64 and her pulse is 87. Her respiration is 16.   Wt Readings from Last 3 Encounters:  09/19/15 172 lb (78.019 kg)  08/30/15 170 lb (77.111 kg)  08/29/15 169 lb (76.658 kg)     Well-developed and well-nourished white female in no obvious distress. Head and neck exam shows no ocular or oral lesions. She has no palpable cervical or supraclavicular lymph nodes. Lungs are clear bilaterally. There may be some slight decrease over on the left upper lung. No wheezes are noted.  Cardiac exam shows regular rate and rhythm with no murmurs, rubs or bruits. Abdomen is soft. She has good bowel sounds. There is no fluid wave. There is no palpable liver or spleen tip. Extremities shows no clubbing, cyanosis or edema. She has good range of motion of her joints. She has good strength in her upper and lower extremities Back exam shows no tenderness over the spine, ribs or hips. Neurological exam shows no focal neurological deficits. Skin exam shows no rashes, ecchymoses or petechia.  Lab Results  Component Value Date   WBC 7.2 09/19/2015   HGB 12.9 09/19/2015   HCT 37.4 09/19/2015   MCV 93 09/19/2015   PLT 243 09/19/2015     Chemistry      Component Value Date/Time   NA 136 09/19/2015 1016   NA 135* 07/26/2015 1159   NA 139 06/21/2015 1836   K 3.8 09/19/2015 1016   K 4.9 07/26/2015 1159   K 3.3* 06/21/2015 1836   CL 110* 09/19/2015 1016   CL 105 06/21/2015 1836   CO2 20 09/19/2015 1016   CO2 21* 07/26/2015 1159   CO2 25 06/21/2015 1836   BUN 15 09/19/2015 1016   BUN 20.6 07/26/2015 1159   BUN 10 06/21/2015 1836   CREATININE 0.8 09/19/2015 1016   CREATININE 0.8 07/26/2015 1159   CREATININE 0.60 06/21/2015 1836      Component Value Date/Time   CALCIUM 9.2 09/19/2015 1016   CALCIUM 9.3 07/26/2015 1159   CALCIUM 9.4 06/21/2015 1836   ALKPHOS 52 09/19/2015 1016   ALKPHOS 120 07/26/2015 1159   AST 30 09/19/2015 1016   AST 24 07/26/2015 1159   ALT 26 09/19/2015 1016   ALT 28 07/26/2015 1159   BILITOT 0.60 09/19/2015 1016   BILITOT 0.45 07/26/2015 1159         Impression and Plan: Carol Buchanan is a 55 year old white female with metastatic poorly differentiated carcinoma of the left lung. She is wild-type for the typical genetic mutations.  -I'm glad that she tolerated the [facility] well. Hopefully, we will find that she is responding.  I will plan to get her back here in 3 more weeks. After her third cycle of treatment, we will then plan for follow-up  scans to see what kind of response we have seen.   Carol Napoleon, MD 2/23/201711:06 AM

## 2015-09-19 NOTE — Patient Instructions (Signed)
Pembrolizumab injection What is this medicine? PEMBROLIZUMAB (pem broe liz ue mab) is a monoclonal antibody. It is used to treat melanoma and non-small cell lung cancer. This medicine may be used for other purposes; ask your health care provider or pharmacist if you have questions. What should I tell my health care provider before I take this medicine? They need to know if you have any of these conditions: -diabetes -immune system problems -inflammatory bowel disease -liver disease -lung or breathing disease -lupus -an unusual or allergic reaction to pembrolizumab, other medicines, foods, dyes, or preservatives -pregnant or trying to get pregnant -breast-feeding How should I use this medicine? This medicine is for infusion into a vein. It is given by a health care professional in a hospital or clinic setting. A special MedGuide will be given to you before each treatment. Be sure to read this information carefully each time. Talk to your pediatrician regarding the use of this medicine in children. Special care may be needed. Overdosage: If you think you have taken too much of this medicine contact a poison control center or emergency room at once. NOTE: This medicine is only for you. Do not share this medicine with others. What if I miss a dose? It is important not to miss your dose. Call your doctor or health care professional if you are unable to keep an appointment. What may interact with this medicine? Interactions have not been studied. Give your health care provider a list of all the medicines, herbs, non-prescription drugs, or dietary supplements you use. Also tell them if you smoke, drink alcohol, or use illegal drugs. Some items may interact with your medicine. This list may not describe all possible interactions. Give your health care provider a list of all the medicines, herbs, non-prescription drugs, or dietary supplements you use. Also tell them if you smoke, drink alcohol, or  use illegal drugs. Some items may interact with your medicine. What should I watch for while using this medicine? Your condition will be monitored carefully while you are receiving this medicine. You may need blood work done while you are taking this medicine. Do not become pregnant while taking this medicine or for 4 months after stopping it. Women should inform their doctor if they wish to become pregnant or think they might be pregnant. There is a potential for serious side effects to an unborn child. Talk to your health care professional or pharmacist for more information. Do not breast-feed an infant while taking this medicine or for 4 months after the last dose. What side effects may I notice from receiving this medicine? Side effects that you should report to your doctor or health care professional as soon as possible: -allergic reactions like skin rash, itching or hives, swelling of the face, lips, or tongue -bloody or black, tarry stools -breathing problems -change in the amount of urine -changes in vision -chest pain -chills -dark urine -dizziness or feeling faint or lightheaded -fast or irregular heartbeat -fever -flushing -hair loss -muscle pain -muscle weakness -persistent headache -signs and symptoms of high blood sugar such as dizziness; dry mouth; dry skin; fruity breath; nausea; stomach pain; increased hunger or thirst; increased urination -signs and symptoms of liver injury like dark urine, light-colored stools, loss of appetite, nausea, right upper belly pain, yellowing of the eyes or skin -stomach pain -weight loss Side effects that usually do not require medical attention (Report these to your doctor or health care professional if they continue or are bothersome.):constipation -cough -diarrhea -joint pain -  tiredness This list may not describe all possible side effects. Call your doctor for medical advice about side effects. You may report side effects to FDA at  1-800-FDA-1088. Where should I keep my medicine? This drug is given in a hospital or clinic and will not be stored at home. NOTE: This sheet is a summary. It may not cover all possible information. If you have questions about this medicine, talk to your doctor, pharmacist, or health care provider.    2016, Elsevier/Gold Standard. (2014-09-11 17:24:19)

## 2015-09-20 ENCOUNTER — Encounter: Payer: Self-pay | Admitting: Hematology & Oncology

## 2015-09-26 ENCOUNTER — Telehealth: Payer: Self-pay | Admitting: *Deleted

## 2015-09-26 NOTE — Telephone Encounter (Signed)
Patient reporting that she had a cat bite. She went to an Urgent Care last pm and was diagnosed with Cellulitis. They started her on Augmentin for 10 days.   Informed Dr Marin Olp and no new orders received. Informed patient to follow up with her PCP if the cellulitis symptoms don't start improving in 24 to 48 hours. She understood.

## 2015-09-30 ENCOUNTER — Encounter: Payer: Self-pay | Admitting: Oncology

## 2015-10-01 ENCOUNTER — Ambulatory Visit (INDEPENDENT_AMBULATORY_CARE_PROVIDER_SITE_OTHER): Payer: BLUE CROSS/BLUE SHIELD | Admitting: Emergency Medicine

## 2015-10-01 ENCOUNTER — Encounter: Payer: Self-pay | Admitting: Emergency Medicine

## 2015-10-01 VITALS — BP 130/82 | HR 92 | Ht 64.0 in | Wt 173.0 lb

## 2015-10-01 DIAGNOSIS — C3412 Malignant neoplasm of upper lobe, left bronchus or lung: Secondary | ICD-10-CM

## 2015-10-01 NOTE — Patient Instructions (Addendum)
Continue your Stiolto once a day.  Continue fluticasone nasal spray 2 sprays each side daily.  Follow with Dr Sondra Come and Dr Katheran Awe as planned.  Repeat Ct scan per their plans.  Follow with Dr Lamonte Sakai in 6 months or sooner if you have any problems

## 2015-10-01 NOTE — Progress Notes (Signed)
Subjective:    Patient ID: Carol Buchanan, female    DOB: November 14, 1960, 55 y.o.   MRN: [id number]  HPI 55 yo former smoker, hx of HTN, hyperlipidemia, allergies. Presents today for eval of progressive exertional dyspnea. She is s/p R sgy about 6 months ago and has been very inactive and believes she is deconditioned. She hears wheezing with exertion. She still has some cough, better since she quit smoking but still does cough some, non-productive. Her GERD is controlled on omeprazole. She was on spiriva once, not sure if it helped.   ROV 12/09/12 -- returns for f/u sob. Last time we ordered PFT, treated her PND. Didn't feel any real improvement with nasonex. She has used some albuterol >> feels that it helps her. No evidence exacerbation. Finished rehab.   ROV 01/20/13 -- f/u for moderate COPD. Last time we started Spiriva as a trial > she believes that it has helped her. Exertional tolerance is improved, does still wheeze sometimes after exertion. She uses albuterol in the am, rarely needs during the day. Her cough is much improved.   ROV 07/13/13 -- hx COPD, cough. Started Spiriva in May 2014. She is scheduled for back sgy w Dr Hal Neer end of Dec. She uses ProAir bid. Her cough is much better. She hasn't been as active because she hurt her foot and now she needs back sgy.   ROV 08/16/13 -- follows for hx COPD, cough. Last time did a trial adding Dulera to Spiriva > she has noticed significant improvement. She had sgy w Dr Hal Neer, went well except for an incisional infxn, on abx.   ROV 03/28/14 -- follow up for COPD, cough. She is on [place]. She has been having more exertional SOB. Continues to go to gym, has been limited a bit by her knee. She hears wheezing w activity. She hasn't  Tried using the SABA. She has nasal congestion, has been using [place].    ROV 11/15/14 -- follow-up visit for COPD and cough. At her last visit we stopped Spiriva and Dulera to see if she would benefit from  Anoro. We also started loratadine 10 mg daily. She believes that she is better on the Anoro. She isn';t using or needing ProAir frequently.  Her GERD is controlled.   Acute OV 06/25/15 -- patient with a history of former tobacco use and COPD that we have been treating with Anoro since April 2016. She reports that she was evaluated for left anterior chest wall pain in the emergency department which prompted a CT scan of the chest on 06/21/15 that I personally reviewed. This showed left upper lobe lingular collapse with a bronchial cutoff sign and enlargement of the left hilar lymph nodes. Also noted are enlarged nodes in the anterior mediastinum and left paratracheal and pretracheal regions. She was referred immediately to see Dr Katheran Awe given the high suspicion for possible primary lung cancer. She is referred to me now urgently to consider tissue diagnosis.   ROV 08/29/15 -- follow-up visit for history of COPD, tobacco use and new diagnosis of non-small cell lung cancer involving the left upper lobe. She underwent bronchoscopy on 07/01/15 that confirmed poorly differentiated adenocarcinoma. She's been seen by Dr Katheran Awe, has completed a course of XRT. She is scheduled to start immunotherapy soon once approved by insurance. Her L sided chest pain is better with XRT.  She feels that she may be more SOB since last time. The Anoro has started making her cough. She is having  nasal congestion with blood secretions, no fever. Her MRI 12/18 showed some paranasal thickening.   ROV 10/01/15 -- follow-up visit for history of non-small cell lung cancer in the left upper lobe, COPD, chronic cough and seasonal allergies. Last month we tried changing her Anoro to Stiolto to see if this formulation would be better with regard to her cough. She has been on immunotherapy under the direction of Dr Katheran Awe.    PULMONARY FUNCTON TEST 12/09/2012  FVC 3.37  FEV1 2.65  FEV1/FVC 78.6  FVC  % Predicted 73  FEV % Predicted 71  FeF  25-75 3.27  FeF 25-75 % Predicted 2.46     Review of Systems  Constitutional: Negative for fever and unexpected weight change.  HENT: Negative for congestion, dental problem, ear pain, nosebleeds, postnasal drip, rhinorrhea, sinus pressure, sneezing, sore throat and trouble swallowing.   Eyes: Negative for redness and itching.  Respiratory: Positive for shortness of breath. Negative for cough, chest tightness and wheezing.   Cardiovascular: Negative for palpitations and leg swelling.  Gastrointestinal: Negative for nausea and vomiting.  Genitourinary: Negative for dysuria.  Musculoskeletal: Negative for joint swelling.  Skin: Negative for rash.  Neurological: Negative for headaches.  Hematological: Does not bruise/bleed easily.  Psychiatric/Behavioral: Negative for dysphoric mood. The patient is not nervous/anxious.         Objective:   Physical Exam Filed Vitals:   10/01/15 1535  BP: 130/82  Pulse: 92  Height: '5\' 4"'$  (1.626 m)  Weight: 173 lb (78.472 kg)  SpO2: 96%   Gen: Pleasant, well-nourished, in no distress,  normal affect  ENT: No lesions,  mouth clear,  oropharynx clear, no postnasal drip  Neck: No JVD, no TMG, no carotid bruits  Lungs: No use of accessory muscles, few exp wheezes on a forced expiration.   Cardiovascular: RRR, heart sounds normal, no murmur or gallops, no peripheral edema  Musculoskeletal: No deformities, no cyanosis or clubbing  Neuro: alert, non focal  Skin: Warm, no lesions or rashes    CT chest 06/21/15 --  COMPARISON: Chest radiograph 06/21/2015  FINDINGS: Collapse and consolidation of the left upper lung and left lingula. Bronchial cut off sign with low-attenuation in the left hilum and probable enlarged left hilar lymph node measuring about 2.2 cm diameter. Additional enlarged lymph nodes in the aortopulmonic window region, anterior mediastinum, left paratracheal, pretracheal, and subcarinal regions. Findings likely due to  obstructing malignancy with metastatic mediastinal lymph nodes. Bronchoscopy may be useful in further evaluation if clinically indicated.  Atelectasis in the lung bases. No pleural effusions. No pneumothorax.  Normal heart size. Normal caliber thoracic aorta. Aberrant right subclavian artery. Esophagus is decompressed.  Included portions of the upper abdominal organs demonstrate diffuse fatty infiltration of the liver. No adrenal gland nodules are demonstrated although the left adrenal gland is not entirely included within the field of view. Degenerative changes in the spine. No destructive bone lesions appreciated.  IMPRESSION: Left upper lung collapse with bronchial cut off sign and enlarged left hilar and mediastinal lymph nodes consistent with obstructing malignancy with lymph node metastasis.      Assessment & Plan:  COPD (chronic obstructive pulmonary disease) She is improved significantly since we changed her Anoro to [employer]. Her cough is better and her breathing is better. I believe she needs to stay on this long-term. Continue albuterol prn.   Primary cancer of left upper lobe of lung [facility]) Continue follow-up with oncology and radiation oncology. She is undergoing immunotherapy. She will have repeat  imaging after her third cycle per her report.

## 2015-10-01 NOTE — Assessment & Plan Note (Signed)
Continue follow-up with oncology and radiation oncology. She is undergoing immunotherapy. She will have repeat imaging after her third cycle per her report.

## 2015-10-01 NOTE — Assessment & Plan Note (Signed)
She is improved significantly since we changed her Anoro to [employer]. Her cough is better and her breathing is better. I believe she needs to stay on this long-term. Continue albuterol prn.

## 2015-10-03 ENCOUNTER — Ambulatory Visit
Admission: RE | Admit: 2015-10-03 | Discharge: 2015-10-03 | Disposition: A | Discharge status: Home / Self Care | Payer: BLUE CROSS/BLUE SHIELD | Source: Ambulatory Visit | Attending: Radiation Oncology | Admitting: Radiation Oncology

## 2015-10-03 ENCOUNTER — Encounter: Payer: Self-pay | Admitting: Radiation Oncology

## 2015-10-03 VITALS — BP 131/75 | HR 93 | Temp 98.5°F | Resp 20 | Wt 173.5 lb

## 2015-10-03 DIAGNOSIS — C3412 Malignant neoplasm of upper lobe, left bronchus or lung: Secondary | ICD-10-CM

## 2015-10-03 NOTE — Progress Notes (Addendum)
PAIN: She is currently in no pain.  RESPIRATORY: Wheezing, Shortness of Breath  Walking and Coughing  Dry. Pt is on room air. Reports skin over treatment site is warm, dry and intact. Occasionally still using Radiaplex if she needs it. SWALLOWING/DIET: Pt denies dysphagia. OTHER: Gate strong and steady, no complaints to rib or spine discomfort.  Dr. Marin Olp: Next appt 12/13/15 BP 131/75 mmHg  Pulse 93  Temp(Src) 98.5 F (36.9 C) (Oral)  Resp 20  Wt 173 lb 8 oz (78.699 kg)  SpO2 98% Wt Readings from Last 3 Encounters:  10/03/15 173 lb 8 oz (78.699 kg)  10/01/15 173 lb (78.472 kg)  09/19/15 172 lb (78.019 kg)

## 2015-10-03 NOTE — Progress Notes (Signed)
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   Indication for treatment:  Spine metastasis, rib metastasis and associated pain, bronchial obstruction       Radiation treatment dates:   08/06/2015-08/23/2015  Site/dose:   Left lung mass, T4 spinal metastasis and left rib metastasis 35 gray in 14 fraction  Narrative:  The patient returns today for routine follow-up.   PAIN: She is currently in no pain. Not taking any pain medications.  RESPIRATORY: Wheezing, Shortness of Breath Walking and Coughing Dry. Pt is on room air. Reports skin over treatment site is warm, dry and intact. Occasionally still using Radiaplex if she needs it. SWALLOWING/DIET: Pt denies dysphagia. OTHER: Gate strong and steady, no complaints to rib or spine discomfort.  Dr. Marin Olp: Next appt 12/13/15 BP 131/75 mmHg  Pulse 93  Temp(Src) 98.5 F (36.9 C) (Oral)  Resp 20  Wt 173 lb 8 oz (78.699 kg)  SpO2 98% Wt Readings from Last 3 Encounters:  10/03/15 173 lb 8 oz (78.699 kg)  10/01/15 173 lb (78.472 kg)  09/19/15 172 lb (78.019 kg)                  ALLERGIES:  has No Known Allergies.  Meds: Current Outpatient Prescriptions  Medication Sig Dispense Refill  . Ascorbic Acid (VITAMIN C) 1000 MG tablet Take 1,000 mg by mouth daily.    . Calcium Carbonate-Vit D-Min 1200-1000 MG-UNIT CHEW Chew 1 tablet by mouth daily.    . DULoxetine (CYMBALTA) 60 MG capsule Take 60 mg by mouth daily. Reported on 08/06/2015  0  . fluticasone (FLONASE) 50 MCG/ACT nasal spray Place 2 sprays into both nostrils daily. 16 g 2  . ibuprofen (ADVIL,MOTRIN) 800 MG tablet Take 800 mg by mouth every 8 (eight) hours as  needed for moderate pain.     Marland Kitchen LORazepam (ATIVAN) 0.5 MG tablet Take 1 tablet (0.5 mg total) by mouth every 6 (six) hours as needed (Nausea or vomiting). 30 tablet 0  . meloxicam (MOBIC) 15 MG tablet Take 15 mg by mouth daily.    . Multiple Vitamins-Minerals (CENTRUM SILVER PO) Take by mouth.    Marland Kitchen omeprazole (PRILOSEC) 20 MG capsule Take 20 mg by mouth daily.    . ondansetron (ZOFRAN) 8 MG tablet Take 1 tablet (8 mg total) by mouth 2 (two) times daily as needed (Nausea or vomiting). 30 tablet 1  . PREMPRO 0.625-2.5 MG tablet   0  . PROAIR HFA 108 (90 BASE) MCG/ACT inhaler inhale 1 puff every 4 hours if needed for shortness of breath 8.5 Inhaler 6  . prochlorperazine (COMPAZINE) 10 MG tablet Take 1 tablet (10 mg total) by mouth every 6 (six) hours as needed (Nausea or vomiting). 30 tablet 1  . simvastatin (ZOCOR) 20 MG tablet Take 20 mg by mouth daily.     . Tiotropium Bromide-Olodaterol (STIOLTO RESPIMAT) 2.5-2.5 MCG/ACT AERS Inhale 2 puffs into the lungs daily. 1 Inhaler 5  . venlafaxine XR (EFFEXOR-XR) 150 MG 24 hr capsule Take 150 mg by mouth daily. Reported on 08/06/2015    . verapamil (COVERA HS) 180 MG (CO) 24 hr tablet Take 180 mg by mouth  daily.     . vitamin E 400 UNIT capsule Take 400 Units by mouth daily.     No current facility-administered medications for this encounter.    Physical Findings: The patient is in no acute distress. Patient is alert and oriented.  weight is 173 lb 8 oz (78.699 kg). Her oral temperature is 98.5 F (36.9 C). Her blood pressure is 131/75 and her pulse is 93. Her respiration is 20 and oxygen saturation is 98%. .  No significant changes. No palpable cervical, supraclavicular or axillary lymphoadenopathy. The heart has a regular rate and rhythm. The lungs are clear to auscultation. Palpation along upper spine area shows no tenderness.   Lab Findings: Lab Results  Component Value Date   WBC 7.2 09/19/2015   HGB 12.9 09/19/2015   HCT 37.4 09/19/2015    MCV 93 09/19/2015   PLT 243 09/19/2015    Radiographic Findings: No results found.  Impression:  The patient is recovering from the effects of radiation.  No pain at this time, breathing better, tolerating immunotherapy well.  Plan:  Continued follow up with Dr.Ennever.  When necessary follow-up in radiation oncology.   -----------------------------------  Blair Promise, PhD, MD  This document serves as a record of services personally performed by Gery Pray, MD. It was created on his behalf by Derek Mound, a trained medical scribe. The creation of this record is based on the scribe's personal observations and the provider's statements to them. This document has been checked and approved by the attending provider.

## 2015-10-07 ENCOUNTER — Other Ambulatory Visit: Payer: Self-pay | Admitting: Family

## 2015-10-07 ENCOUNTER — Ambulatory Visit (HOSPITAL_BASED_OUTPATIENT_CLINIC_OR_DEPARTMENT_OTHER): Payer: BLUE CROSS/BLUE SHIELD | Admitting: Family

## 2015-10-07 ENCOUNTER — Ambulatory Visit (HOSPITAL_BASED_OUTPATIENT_CLINIC_OR_DEPARTMENT_OTHER): Payer: BLUE CROSS/BLUE SHIELD

## 2015-10-07 ENCOUNTER — Encounter: Payer: Self-pay | Admitting: Family

## 2015-10-07 ENCOUNTER — Telehealth: Payer: Self-pay | Admitting: *Deleted

## 2015-10-07 VITALS — BP 139/63 | HR 114 | Temp 98.1°F | Resp 18 | Ht 64.0 in | Wt 172.0 lb

## 2015-10-07 DIAGNOSIS — R21 Rash and other nonspecific skin eruption: Secondary | ICD-10-CM | POA: Diagnosis not present

## 2015-10-07 DIAGNOSIS — C7951 Secondary malignant neoplasm of bone: Secondary | ICD-10-CM

## 2015-10-07 DIAGNOSIS — C3491 Malignant neoplasm of unspecified part of right bronchus or lung: Secondary | ICD-10-CM | POA: Diagnosis not present

## 2015-10-07 MED ORDER — SODIUM CHLORIDE 0.9 % IV SOLN
200.0000 mg | Freq: Once | INTRAVENOUS | Status: AC
  Administered 2015-10-07: 200 mg via INTRAVENOUS
  Filled 2015-10-07: qty 3.2

## 2015-10-07 MED ORDER — PREDNISONE 20 MG PO TABS: 80.0000 mg | ORAL_TABLET | Freq: Every day | ORAL | Status: DC

## 2015-10-07 MED ORDER — DENOSUMAB 120 MG/1.7ML ~~LOC~~ SOLN
120.0000 mg | Freq: Once | SUBCUTANEOUS | Status: AC
  Administered 2015-10-07: 120 mg via SUBCUTANEOUS
  Filled 2015-10-07: qty 1.7

## 2015-10-07 MED ORDER — METHYLPREDNISOLONE SODIUM SUCC 125 MG IJ SOLR
INTRAMUSCULAR | Status: AC
  Filled 2015-10-07: qty 2

## 2015-10-07 NOTE — Progress Notes (Signed)
Hematology and Oncology Follow Up Visit  KHIRA CUDMORE [id number] [birth year] 55 y.o. 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 DSwartzendruberis here today with a diffuse maculopapular rash covering her front, back, arms and legs as well as her neck and cheeks that start yesterday. It is quite impressive. She is itching. There are no vesicles or open lesions.  She has now completed 2 cycles of Keytruda.  No fever, chills, n/v, dizziness, chest pain, palpitations, abdominal pain or changes in bowel or bladder habits. She has been having diarrhea 2 times a week. She attributes this to what she eats.  She still has some SOB with exertion which resolves with taking a moment to rest. Her cough has improved since she started the STIOLTO inhaler 6 weeks ago.  She has maintained a good appetite and is staying well hydrated. She denies feeling dehydrated.  No swelling, tenderness, numbness or tingling in her extremities. No c/o joint aches or "bone" pain.    Medications:    Medication List       This list is accurate as of: 10/07/15  3:15 PM.  Always use your most recent med list.               Calcium Carbonate-Vit D-Min 1200-1000 MG-UNIT Chew  Chew 1 tablet by mouth daily.     CENTRUM SILVER PO  Take by mouth.     DULoxetine 60 MG capsule  Commonly known as:  CYMBALTA  Take 60 mg by mouth daily. Reported on 08/06/2015     fluticasone 50 MCG/ACT nasal spray  Commonly known as:  FLONASE  Place 2 sprays into both nostrils daily.     ibuprofen 800 MG tablet  Commonly known as:  ADVIL,MOTRIN  Take 800 mg by mouth every 8 (eight) hours as needed for moderate pain.     LORazepam 0.5 MG tablet  Commonly known as:  ATIVAN  Take 1 tablet  (0.5 mg total) by mouth every 6 (six) hours as needed (Nausea or vomiting).     meloxicam 15 MG tablet  Commonly known as:  MOBIC  Take 15 mg by mouth daily.     omeprazole 20 MG capsule  Commonly known as:  PRILOSEC  Take 20 mg by mouth daily.     ondansetron 8 MG tablet  Commonly known as:  ZOFRAN  Take 1 tablet (8 mg total) by mouth 2 (two) times daily as needed (Nausea or vomiting).     predniSONE 20 MG tablet  Commonly known as:  DELTASONE  Take 4 tablets (80 mg total) by mouth daily with breakfast.     PREMPRO 0.625-2.5 MG tablet  Generic drug:  estrogen (conjugated)-medroxyprogesterone     PROAIR HFA 108 (90 Base) MCG/ACT inhaler  Generic drug:  albuterol  inhale 1 puff every 4 hours if needed for shortness of breath     prochlorperazine 10 MG tablet  Commonly known as:  COMPAZINE  Take 1 tablet (10 mg total) by mouth every 6 (six) hours as needed (Nausea or vomiting).     simvastatin 20 MG tablet  Commonly known as:  ZOCOR  Take 20 mg by mouth daily.     Tiotropium Bromide-Olodaterol 2.5-2.5 MCG/ACT Aers  Commonly known as:  STIOLTO RESPIMAT  Inhale 2 puffs into the lungs daily.     venlafaxine XR 150 MG 24 hr capsule  Commonly known as:  EFFEXOR-XR  Take 150 mg by mouth daily. Reported on 08/06/2015     verapamil 180 MG (CO) 24 hr tablet  Commonly known as:  COVERA HS  Take 180 mg by mouth daily.     vitamin C 1000 MG tablet  Take 1,000 mg by mouth daily.     vitamin E 400 UNIT capsule  Take 400 Units by mouth daily.        Allergies: No Known Allergies  Past Medical History, Surgical history, Social history, and Family History were reviewed and updated.  Review of Systems: All other 10 point review of systems is negative.   Physical Exam:  height is 5' 4"  (1.626 m) and weight is 172 lb (78.019 kg). Her oral temperature is 98.1 F (36.7 C). Her blood pressure is 139/63 and her pulse is 114. Her respiration is 18.   Wt Readings from Last 3  Encounters:  10/07/15 172 lb (78.019 kg)  10/03/15 173 lb 8 oz (78.699 kg)  10/01/15 173 lb (78.472 kg)    Ocular: Sclerae unicteric, pupils equal, round and reactive to light Ear-nose-throat: Oropharynx clear, dentition fair Lymphatic: No cervical supraclavicular or axillary adenopathy Lungs no rales or rhonchi, good excursion bilaterally Heart regular rate and rhythm, no murmur appreciated Abd soft, nontender, positive bowel sounds, no liver or spleen tip palpated on exam, no fluid wave MSK no focal spinal tenderness, no joint edema Neuro: non-focal, well-oriented, appropriate affect Breasts: Deferred  Lab Results  Component Value Date   WBC 7.2 09/19/2015   HGB 12.9 09/19/2015   HCT 37.4 09/19/2015   MCV 93 09/19/2015   PLT 243 09/19/2015   No results found for: FERRITIN, IRON, TIBC, UIBC, IRONPCTSAT Lab Results  Component Value Date   RBC 4.01 09/19/2015   No results found for: KPAFRELGTCHN, LAMBDASER, KAPLAMBRATIO No results found for: Kandis Cocking, IGMSERUM No results found for: Odetta Pink, SPEI   Chemistry      Component Value Date/Time   NA 136 09/19/2015 1016   NA 135* 07/26/2015 1159   NA 139 06/21/2015 1836   K 3.8 09/19/2015 1016   K 4.9 07/26/2015 1159   K 3.3* 06/21/2015 1836   CL 110* 09/19/2015 1016   CL 105 06/21/2015 1836   CO2 20 09/19/2015 1016   CO2 21* 07/26/2015 1159   CO2 25 06/21/2015 1836   BUN 15 09/19/2015 1016   BUN 20.6 07/26/2015 1159   BUN 10 06/21/2015 1836   CREATININE 0.8 09/19/2015 1016   CREATININE 0.8 07/26/2015 1159   CREATININE 0.60 06/21/2015 1836      Component Value Date/Time   CALCIUM 9.2 09/19/2015 1016   CALCIUM 9.3 07/26/2015 1159   CALCIUM 9.4 06/21/2015 1836   ALKPHOS 52 09/19/2015 1016   ALKPHOS 120 07/26/2015 1159   AST 30 09/19/2015 1016   AST 24 07/26/2015 1159   ALT 26 09/19/2015 1016   ALT 28 07/26/2015 1159   BILITOT 0.60 09/19/2015 1016    BILITOT 0.45 07/26/2015 1159     Impression and Plan: Ms. Noguera is a 55 yo white female with metastatic poorly differentiated carcinoma of the left lung and wild-type for the typical genetic mutations. She has completed 2 cycles of Keytruda and has now developed a diffuse maculopapular rash over almost her entire body. She is itching and not resting  well.  We will go ahead and give her a dose of Solumedrol IV while she is here.  She will then start Prednisone 80 mg daily tomorrow and we will see her back at her on Friday at her already scheduled appointment. We will re-evaluate her steroid taper at that time.  We will hold off on treating her with Keytruda for now.  She will contact us with any questions or concerns. We can certainly see her sooner if need be.   Eliezer Bottom, NP 3/13/20173:15 PM

## 2015-10-07 NOTE — Patient Instructions (Signed)
Denosumab injection What is this medicine? DENOSUMAB (den oh sue mab) slows bone breakdown. Prolia is used to treat osteoporosis in women after menopause and in men. Xgeva is used to prevent bone fractures and other bone problems caused by cancer bone metastases. Xgeva is also used to treat giant cell tumor of the bone. This medicine may be used for other purposes; ask your health care provider or pharmacist if you have questions. What should I tell my health care provider before I take this medicine? They need to know if you have any of these conditions: -dental disease -eczema -infection or history of infections -kidney disease or on dialysis -low blood calcium or vitamin D -malabsorption syndrome -scheduled to have surgery or tooth extraction -taking medicine that contains denosumab -thyroid or parathyroid disease -an unusual reaction to denosumab, other medicines, foods, dyes, or preservatives -pregnant or trying to get pregnant -breast-feeding How should I use this medicine? This medicine is for injection under the skin. It is given by a health care professional in a hospital or clinic setting. If you are getting Prolia, a special MedGuide will be given to you by the pharmacist with each prescription and refill. Be sure to read this information carefully each time. For Prolia, talk to your pediatrician regarding the use of this medicine in children. Special care may be needed. For Xgeva, talk to your pediatrician regarding the use of this medicine in children. While this drug may be prescribed for children as young as 13 years for selected conditions, precautions do apply. Overdosage: If you think you have taken too much of this medicine contact a poison control center or emergency room at once. NOTE: This medicine is only for you. Do not share this medicine with others. What if I miss a dose? It is important not to miss your dose. Call your doctor or health care professional if you are  unable to keep an appointment. What may interact with this medicine? Do not take this medicine with any of the following medications: -other medicines containing denosumab This medicine may also interact with the following medications: -medicines that suppress the immune system -medicines that treat cancer -steroid medicines like prednisone or cortisone This list may not describe all possible interactions. Give your health care provider a list of all the medicines, herbs, non-prescription drugs, or dietary supplements you use. Also tell them if you smoke, drink alcohol, or use illegal drugs. Some items may interact with your medicine. What should I watch for while using this medicine? Visit your doctor or health care professional for regular checks on your progress. Your doctor or health care professional may order blood tests and other tests to see how you are doing. Call your doctor or health care professional if you get a cold or other infection while receiving this medicine. Do not treat yourself. This medicine may decrease your body's ability to fight infection. You should make sure you get enough calcium and vitamin D while you are taking this medicine, unless your doctor tells you not to. Discuss the foods you eat and the vitamins you take with your health care professional. See your dentist regularly. Brush and floss your teeth as directed. Before you have any dental work done, tell your dentist you are receiving this medicine. Do not become pregnant while taking this medicine or for 5 months after stopping it. Women should inform their doctor if they wish to become pregnant or think they might be pregnant. There is a potential for serious side effects   to an unborn child. Talk to your health care professional or pharmacist for more information. What side effects may I notice from receiving this medicine? Side effects that you should report to your doctor or health care professional as soon as  possible: -allergic reactions like skin rash, itching or hives, swelling of the face, lips, or tongue -breathing problems -chest pain -fast, irregular heartbeat -feeling faint or lightheaded, falls -fever, chills, or any other sign of infection -muscle spasms, tightening, or twitches -numbness or tingling -skin blisters or bumps, or is dry, peels, or red -slow healing or unexplained pain in the mouth or jaw -unusual bleeding or bruising Side effects that usually do not require medical attention (Report these to your doctor or health care professional if they continue or are bothersome.): -muscle pain -stomach upset, gas This list may not describe all possible side effects. Call your doctor for medical advice about side effects. You may report side effects to FDA at 1-800-FDA-1088. Where should I keep my medicine? This medicine is only given in a clinic, doctor's office, or other health care setting and will not be stored at home. NOTE: This sheet is a summary. It may not cover all possible information. If you have questions about this medicine, talk to your doctor, pharmacist, or health care provider.    2016, Elsevier/Gold Standard. (2012-01-11 12:37:47) Methylprednisolone Solution for Injection What is this medicine? METHYLPREDNISOLONE (meth ill pred NISS oh lone) is a corticosteroid. It is commonly used to treat inflammation of the skin, joints, lungs, and other organs. Common conditions treated include asthma, allergies, and arthritis. It is also used for other conditions, such as blood disorders and diseases of the adrenal glands. This medicine may be used for other purposes; ask your health care provider or pharmacist if you have questions. What should I tell my health care provider before I take this medicine? They need to know if you have any of these conditions: -cataracts or glaucoma -Cushing's syndrome -heart disease -high blood pressure -infection including  tuberculosis -low calcium or potassium levels in the blood -recent surgery -seizures -stomach or intestinal disease, including colitis -threadworms -thyroid problems -an unusual or allergic reaction to methylprednisolone, corticosteroids, benzyl alcohol, other medicines, foods, dyes, or preservatives -pregnant or trying to get pregnant -breast-feeding How should I use this medicine? This medicine is for injection or infusion into a vein. It is also for injection into a muscle. It is given by a health care professional in a hospital or clinic setting. Talk to your pediatrician regarding the use of this medicine in children. While this drug may be prescribed for selected conditions, precautions do apply. Overdosage: If you think you have taken too much of this medicine contact a poison control center or emergency room at once. NOTE: This medicine is only for you. Do not share this medicine with others. What if I miss a dose? This does not apply. What may interact with this medicine? Do not take this medicine with any of the following medications: -mifepristone This medicine may also interact with the following medications: -aspirin and aspirin-like medicines -cyclosporin -ketoconazole -phenobarbital -phenytoin -rifampin -tacrolimus -troleandomycin -vaccines -warfarin This list may not describe all possible interactions. Give your health care provider a list of all the medicines, herbs, non-prescription drugs, or dietary supplements you use. Also tell them if you smoke, drink alcohol, or use illegal drugs. Some items may interact with your medicine. What should I watch for while using this medicine? Visit your doctor or health care professional for  regular checks on your progress. If you are taking this medicine for a long time, carry an identification card with your name and address, the type and dose of your medicine, and your doctor's name and address. The medicine may increase your  risk of getting an infection. Stay away from people who are sick. Tell your doctor or health care professional if you are around anyone with measles or chickenpox. You may need to avoid some vaccines. Talk to your health care provider for more information. If you are going to have surgery, tell your doctor or health care professional that you have taken this medicine within the last twelve months. Ask your doctor or health care professional about your diet. You may need to lower the amount of salt you eat. The medicine can increase your blood sugar. If you are a diabetic check with your doctor if you need help adjusting the dose of your diabetic medicine. What side effects may I notice from receiving this medicine? Side effects that you should report to your doctor or health care professional as soon as possible: -allergic reactions like skin rash, itching or hives, swelling of the face, lips, or tongue -bloody or tarry stools -changes in vision -eye pain or bulging eyes -fever, sore throat, sneezing, cough, or other signs of infection, wounds that will not heal -increased thirst -irregular heartbeat -muscle cramps -pain in hips, back, ribs, arms, shoulders, or legs -swelling of the ankles, feet, hands -trouble passing urine or change in the amount of urine -unusual bleeding or bruising -unusually weak or tired -weight gain or weight loss Side effects that usually do not require medical attention (report to your doctor or health care professional if they continue or are bothersome): -changes in emotions or moods -constipation or diarrhea -headache -irritation at site where injected -nausea, vomiting -skin problems, acne, thin and shiny skin -trouble sleeping -unusual hair growth on the face or body This list may not describe all possible side effects. Call your doctor for medical advice about side effects. You may report side effects to FDA at 1-800-FDA-1088. Where should I keep my  medicine? This drug is given in a hospital or clinic and will not be stored at home. NOTE: This sheet is a summary. It may not cover all possible information. If you have questions about this medicine, talk to your doctor, pharmacist, or health care provider.    2016, [employer] Standard. (2012-04-12 11:37:16)

## 2015-10-07 NOTE — Telephone Encounter (Signed)
Patient c/o bright red rash on trunk, back, spreading down both legs and she states it's continuing to spread. It's itchy, but non-painful. She denies any other symptoms.  Spoke to Dr Marin Olp and he wants patient to come in to be seen. Appointment made with NP. Patient aware.

## 2015-10-09 ENCOUNTER — Ambulatory Visit (HOSPITAL_BASED_OUTPATIENT_CLINIC_OR_DEPARTMENT_OTHER): Payer: BLUE CROSS/BLUE SHIELD

## 2015-10-09 ENCOUNTER — Telehealth: Payer: Self-pay | Admitting: *Deleted

## 2015-10-09 ENCOUNTER — Other Ambulatory Visit (HOSPITAL_BASED_OUTPATIENT_CLINIC_OR_DEPARTMENT_OTHER): Payer: BLUE CROSS/BLUE SHIELD

## 2015-10-09 ENCOUNTER — Encounter: Payer: Self-pay | Admitting: Family

## 2015-10-09 ENCOUNTER — Ambulatory Visit (HOSPITAL_BASED_OUTPATIENT_CLINIC_OR_DEPARTMENT_OTHER): Payer: BLUE CROSS/BLUE SHIELD | Admitting: Family

## 2015-10-09 VITALS — BP 151/74 | HR 89 | Temp 97.8°F | Resp 18 | Ht 64.0 in | Wt 173.0 lb

## 2015-10-09 DIAGNOSIS — C3491 Malignant neoplasm of unspecified part of right bronchus or lung: Secondary | ICD-10-CM

## 2015-10-09 DIAGNOSIS — C3490 Malignant neoplasm of unspecified part of unspecified bronchus or lung: Secondary | ICD-10-CM

## 2015-10-09 DIAGNOSIS — R21 Rash and other nonspecific skin eruption: Secondary | ICD-10-CM

## 2015-10-09 DIAGNOSIS — L270 Generalized skin eruption due to drugs and medicaments taken internally: Secondary | ICD-10-CM

## 2015-10-09 LAB — CMP (CANCER CENTER ONLY)
ALT(SGPT): 27 U/L (ref 10–47)
AST: 31 U/L (ref 11–38)
Albumin: 4 g/dL (ref 3.3–5.5)
Alkaline Phosphatase: 51 U/L (ref 26–84)
BILIRUBIN TOTAL: 0.6 mg/dL (ref 0.20–1.60)
BUN, Bld: 16 mg/dL (ref 7–22)
CALCIUM: 9.2 mg/dL (ref 8.0–10.3)
CO2: 23 meq/L (ref 18–33)
Chloride: 103 mEq/L (ref 98–108)
Creat: 0.9 mg/dl (ref 0.6–1.2)
GLUCOSE: 97 mg/dL (ref 73–118)
Potassium: 3.3 mEq/L (ref 3.3–4.7)
Sodium: 139 mEq/L (ref 128–145)
Total Protein: 7.4 g/dL (ref 6.4–8.1)

## 2015-10-09 LAB — CBC WITH DIFFERENTIAL (CANCER CENTER ONLY)
BASO#: 0 10*3/uL (ref 0.0–0.2)
BASO%: 0.4 % (ref 0.0–2.0)
EOS%: 0.4 % (ref 0.0–7.0)
Eosinophils Absolute: 0 10*3/uL (ref 0.0–0.5)
HEMATOCRIT: 39.2 % (ref 34.8–46.6)
HGB: 13.7 g/dL (ref 11.6–15.9)
LYMPH#: 0.5 10*3/uL — AB (ref 0.9–3.3)
LYMPH%: 4.5 % — AB (ref 14.0–48.0)
MCH: 32.8 pg (ref 26.0–34.0)
MCHC: 34.9 g/dL (ref 32.0–36.0)
MCV: 94 fL (ref 81–101)
MONO#: 0.6 10*3/uL (ref 0.1–0.9)
MONO%: 5.1 % (ref 0.0–13.0)
NEUT#: 9.7 10*3/uL — ABNORMAL HIGH (ref 1.5–6.5)
NEUT%: 89.6 % — AB (ref 39.6–80.0)
Platelets: 281 10*3/uL (ref 145–400)
RBC: 4.18 10*6/uL (ref 3.70–5.32)
RDW: 13.5 % (ref 11.1–15.7)
WBC: 10.9 10*3/uL — ABNORMAL HIGH (ref 3.9–10.0)

## 2015-10-09 MED ORDER — METHYLPREDNISOLONE SODIUM SUCC 125 MG IJ SOLR
125.0000 mg | Freq: Once | INTRAMUSCULAR | Status: AC
  Administered 2015-10-09: 125 mg via INTRAVENOUS

## 2015-10-09 MED ORDER — FAMOTIDINE IN NACL 20-0.9 MG/50ML-% IV SOLN
40.0000 mg | Freq: Once | INTRAVENOUS | Status: AC
  Administered 2015-10-09: 40 mg via INTRAVENOUS

## 2015-10-09 MED ORDER — FAMOTIDINE IN NACL 20-0.9 MG/50ML-% IV SOLN
INTRAVENOUS | Status: AC
  Filled 2015-10-09: qty 100

## 2015-10-09 MED ORDER — PREDNISONE 20 MG PO TABS: 40.0000 mg | ORAL_TABLET | Freq: Two times a day (BID) | ORAL | Status: DC

## 2015-10-09 MED ORDER — METHYLPREDNISOLONE SODIUM SUCC 125 MG IJ SOLR
INTRAMUSCULAR | Status: AC
  Filled 2015-10-09: qty 2

## 2015-10-09 MED ORDER — MONTELUKAST SODIUM 10 MG PO TABS: 10.0000 mg | ORAL_TABLET | Freq: Every day | ORAL | Status: DC

## 2015-10-09 NOTE — Patient Instructions (Signed)
Methylprednisolone Solution for Injection What is this medicine? METHYLPREDNISOLONE (meth ill pred NISS oh lone) is a corticosteroid. It is commonly used to treat inflammation of the skin, joints, lungs, and other organs. Common conditions treated include asthma, allergies, and arthritis. It is also used for other conditions, such as blood disorders and diseases of the adrenal glands. This medicine may be used for other purposes; ask your health care provider or pharmacist if you have questions. What should I tell my health care provider before I take this medicine? They need to know if you have any of these conditions: -cataracts or glaucoma -Cushing's syndrome -heart disease -high blood pressure -infection including tuberculosis -low calcium or potassium levels in the blood -recent surgery -seizures -stomach or intestinal disease, including colitis -threadworms -thyroid problems -an unusual or allergic reaction to methylprednisolone, corticosteroids, benzyl alcohol, other medicines, foods, dyes, or preservatives -pregnant or trying to get pregnant -breast-feeding How should I use this medicine? This medicine is for injection or infusion into a vein. It is also for injection into a muscle. It is given by a health care professional in a hospital or clinic setting. Talk to your pediatrician regarding the use of this medicine in children. While this drug may be prescribed for selected conditions, precautions do apply. Overdosage: If you think you have taken too much of this medicine contact a poison control center or emergency room at once. NOTE: This medicine is only for you. Do not share this medicine with others. What if I miss a dose? This does not apply. What may interact with this medicine? Do not take this medicine with any of the following medications: -mifepristone This medicine may also interact with the following medications: -aspirin and aspirin-like  medicines -cyclosporin -ketoconazole -phenobarbital -phenytoin -rifampin -tacrolimus -troleandomycin -vaccines -warfarin This list may not describe all possible interactions. Give your health care provider a list of all the medicines, herbs, non-prescription drugs, or dietary supplements you use. Also tell them if you smoke, drink alcohol, or use illegal drugs. Some items may interact with your medicine. What should I watch for while using this medicine? Visit your doctor or health care professional for regular checks on your progress. If you are taking this medicine for a long time, carry an identification card with your name and address, the type and dose of your medicine, and your doctor's name and address. The medicine may increase your risk of getting an infection. Stay away from people who are sick. Tell your doctor or health care professional if you are around anyone with measles or chickenpox. You may need to avoid some vaccines. Talk to your health care provider for more information. If you are going to have surgery, tell your doctor or health care professional that you have taken this medicine within the last twelve months. Ask your doctor or health care professional about your diet. You may need to lower the amount of salt you eat. The medicine can increase your blood sugar. If you are a diabetic check with your doctor if you need help adjusting the dose of your diabetic medicine. What side effects may I notice from receiving this medicine? Side effects that you should report to your doctor or health care professional as soon as possible: -allergic reactions like skin rash, itching or hives, swelling of the face, lips, or tongue -bloody or tarry stools -changes in vision -eye pain or bulging eyes -fever, sore throat, sneezing, cough, or other signs of infection, wounds that will not heal -increased thirst -irregular  heartbeat -muscle cramps -pain in hips, back, ribs, arms,  shoulders, or legs -swelling of the ankles, feet, hands -trouble passing urine or change in the amount of urine -unusual bleeding or bruising -unusually weak or tired -weight gain or weight loss Side effects that usually do not require medical attention (report to your doctor or health care professional if they continue or are bothersome): -changes in emotions or moods -constipation or diarrhea -headache -irritation at site where injected -nausea, vomiting -skin problems, acne, thin and shiny skin -trouble sleeping -unusual hair growth on the face or body This list may not describe all possible side effects. Call your doctor for medical advice about side effects. You may report side effects to FDA at 1-800-FDA-1088. Where should I keep my medicine? This drug is given in a hospital or clinic and will not be stored at home. NOTE: This sheet is a summary. It may not cover all possible information. If you have questions about this medicine, talk to your doctor, pharmacist, or health care provider.    2016, Elsevier/Gold Standard. (2012-04-12 11:37:16) Famotidine injection What is this medicine? FAMOTIDINE (fa MOE ti deen) is a type of antihistamine that blocks the release of stomach acid. It is used to treat stomach or intestinal ulcers. It can relieve ulcer pain and discomfort, and the heartburn from acid reflux. This medicine may be used for other purposes; ask your health care provider or pharmacist if you have questions. What should I tell my health care provider before I take this medicine? They need to know if you have any of these conditions: -kidney or liver disease -an unusual or allergic reaction to famotidine, other medicines, foods, dyes, or preservatives -pregnant or trying to get pregnant -breast-feeding How should I use this medicine? This medicine is for infusion into a vein. It is given by a health care professional in a hospital or clinic setting. Talk to your  pediatrician regarding the use of this medicine in children. Special care may be needed. Overdosage: If you think you have taken too much of this medicine contact a poison control center or emergency room at once. NOTE: This medicine is only for you. Do not share this medicine with others. What if I miss a dose? This does not apply. What may interact with this medicine? -delavirdine -itraconazole -ketoconazole This list may not describe all possible interactions. Give your health care provider a list of all the medicines, herbs, non-prescription drugs, or dietary supplements you use. Also tell them if you smoke, drink alcohol, or use illegal drugs. Some items may interact with your medicine. What should I watch for while using this medicine? Tell your doctor or health care professional if your condition does not start to get better or gets worse. Do not take with aspirin, ibuprofen, or other antiinflammatory medicines. These can aggravate your condition. Do not smoke cigarettes or drink alcohol. These increase irritation in your stomach and can increase the time it will take for ulcers to heal. Cigarettes and alcohol can also worsen acid reflux or heartburn. If you get black, tarry stools or vomit up what looks like coffee grounds, call your doctor or health care professional at once. You may have a bleeding ulcer. What side effects may I notice from receiving this medicine? Side effects that you should report to your doctor or health care professional as soon as possible: -allergic reactions like skin rash, itching or hives, swelling of the face, lips, or tongue -agitation, nervousness -confusion -hallucinations Side effects that usually  do not require medical attention (report to your doctor or health care professional if they continue or are bothersome): -constipation -diarrhea -dizziness -headache This list may not describe all possible side effects. Call your doctor for medical advice  about side effects. You may report side effects to FDA at 1-800-FDA-1088. Where should I keep my medicine? This medicine is given in a hospital or clinic. You will not be given this medicine to store at home. NOTE: This sheet is a summary. It may not cover all possible information. If you have questions about this medicine, talk to your doctor, pharmacist, or health care provider.    2016, Elsevier/Gold Standard. (2007-11-16 13:24:51)

## 2015-10-09 NOTE — Progress Notes (Signed)
Hematology and Oncology Follow Up Visit  Carol Buchanan [id number] 1960/08/17 55 y.o. 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 here today with worsening of her diffuse maculopapular rash. It now covers her entire body. She c/o itching. There are no vesicles or open lesions.  She has not been taking benadryl. She denies any medication or diet changes.  She has had no fever, chills, n/v, dizziness, chest pain, palpitations, abdominal pain or changes in bowel or bladder habits. She still has some SOB and her dry cough is unchanged and tolerable. She has maintained a good appetite and is staying well hydrated. She denies feeling dehydrated.  No swelling, tenderness, numbness or tingling in her extremities. No c/o joint aches or "bone" pain.    Medications:    Medication List       This list is accurate as of: 10/09/15  1:31 PM.  Always use your most recent med list.               Calcium Carbonate-Vit D-Min 1200-1000 MG-UNIT Chew  Chew 1 tablet by mouth daily.     CENTRUM SILVER PO  Take by mouth.     DULoxetine 60 MG capsule  Commonly known as:  CYMBALTA  Take 60 mg by mouth daily. Reported on 08/06/2015     fluticasone 50 MCG/ACT nasal spray  Commonly known as:  FLONASE  Place 2 sprays into both nostrils daily.     ibuprofen 800 MG tablet  Commonly known as:  ADVIL,MOTRIN  Take 800 mg by mouth every 8 (eight) hours as needed for moderate pain.     LORazepam 0.5 MG tablet  Commonly known as:  ATIVAN  Take 1 tablet (0.5 mg total) by mouth every 6 (six) hours as needed (Nausea or vomiting).     meloxicam 15 MG tablet  Commonly known as:  MOBIC  Take 15 mg by mouth daily.     omeprazole 20 MG capsule    Commonly known as:  PRILOSEC  Take 20 mg by mouth daily.     ondansetron 8 MG tablet  Commonly known as:  ZOFRAN  Take 1 tablet (8 mg total) by mouth 2 (two) times daily as needed (Nausea or vomiting).     predniSONE 20 MG tablet  Commonly known as:  DELTASONE  Take 4 tablets (80 mg total) by mouth daily with breakfast.     PREMPRO 0.625-2.5 MG tablet  Generic drug:  estrogen (conjugated)-medroxyprogesterone     PROAIR HFA 108 (90 Base) MCG/ACT inhaler  Generic drug:  albuterol  inhale 1 puff every 4 hours if needed for shortness of breath     prochlorperazine 10 MG tablet  Commonly known as:  COMPAZINE  Take 1 tablet (10 mg total) by mouth every 6 (six) hours as needed (Nausea or vomiting).     simvastatin 20 MG tablet  Commonly known as:  ZOCOR  Take 20 mg by mouth daily.     Tiotropium Bromide-Olodaterol 2.5-2.5 MCG/ACT Aers  Commonly known as:  STIOLTO RESPIMAT  Inhale 2 puffs into the lungs daily.     venlafaxine XR 150 MG 24 hr capsule  Commonly known as:  EFFEXOR-XR  Take 150 mg by mouth daily. Reported on 08/06/2015  verapamil 180 MG (CO) 24 hr tablet  Commonly known as:  COVERA HS  Take 180 mg by mouth daily.     vitamin C 1000 MG tablet  Take 1,000 mg by mouth daily.     vitamin E 400 UNIT capsule  Take 400 Units by mouth daily.        Allergies: No Known Allergies  Past Medical History, Surgical history, Social history, and Family History were reviewed and updated.  Review of Systems: All other 10 point review of systems is negative.   Physical Exam:  height is 5' 4"  (1.626 m) and weight is 173 lb (78.472 kg). Her oral temperature is 97.8 F (36.6 C). Her blood pressure is 151/74 and her pulse is 89. Her respiration is 18.   Wt Readings from Last 3 Encounters:  10/09/15 173 lb (78.472 kg)  10/07/15 172 lb (78.019 kg)  10/03/15 173 lb 8 oz (78.699 kg)    Ocular: Sclerae unicteric, pupils equal, round and reactive to light Ear-nose-throat:  Oropharynx clear, dentition fair Lymphatic: No cervical supraclavicular or axillary adenopathy Lungs no rales or rhonchi, good excursion bilaterally Heart regular rate and rhythm, no murmur appreciated Abd soft, nontender, positive bowel sounds, no liver or spleen tip palpated on exam, no fluid wave MSK no focal spinal tenderness, no joint edema Neuro: non-focal, well-oriented, appropriate affect Breasts: Deferred  Lab Results  Component Value Date   WBC 10.9* 10/09/2015   HGB 13.7 10/09/2015   HCT 39.2 10/09/2015   MCV 94 10/09/2015   PLT 281 10/09/2015   No results found for: FERRITIN, IRON, TIBC, UIBC, IRONPCTSAT Lab Results  Component Value Date   RBC 4.18 10/09/2015   No results found for: KPAFRELGTCHN, LAMBDASER, KAPLAMBRATIO No results found for: Kandis Cocking, IGMSERUM No results found for: Odetta Pink, SPEI   Chemistry      Component Value Date/Time   NA 136 09/19/2015 1016   NA 135* 07/26/2015 1159   NA 139 06/21/2015 1836   K 3.8 09/19/2015 1016   K 4.9 07/26/2015 1159   K 3.3* 06/21/2015 1836   CL 110* 09/19/2015 1016   CL 105 06/21/2015 1836   CO2 20 09/19/2015 1016   CO2 21* 07/26/2015 1159   CO2 25 06/21/2015 1836   BUN 15 09/19/2015 1016   BUN 20.6 07/26/2015 1159   BUN 10 06/21/2015 1836   CREATININE 0.8 09/19/2015 1016   CREATININE 0.8 07/26/2015 1159   CREATININE 0.60 06/21/2015 1836      Component Value Date/Time   CALCIUM 9.2 09/19/2015 1016   CALCIUM 9.3 07/26/2015 1159   CALCIUM 9.4 06/21/2015 1836   ALKPHOS 52 09/19/2015 1016   ALKPHOS 120 07/26/2015 1159   AST 30 09/19/2015 1016   AST 24 07/26/2015 1159   ALT 26 09/19/2015 1016   ALT 28 07/26/2015 1159   BILITOT 0.60 09/19/2015 1016   BILITOT 0.45 07/26/2015 1159     Impression and Plan: Carol Buchanan is a 55 yo white female with metastatic poorly differentiated carcinoma of the left lung and wild-type for the typical genetic  mutations. She has completed 2 cycles of Keytruda and developed a diffuse maculopapular rash earlier this week. The rash has now spread over her entire body. She is itching but there are no vesicles or lesions.  She will received Pepcid and Solumedrol IV while she is here today.  We will have her take Prednisone 40 Mg BID and also Singulair 10 mg daily.  We will continue to hold the [facility] for now and have her keep her appointment with [place] on Friday to re-evaluate the rash.  She will contact us with any questions or concerns. We can certainly see her sooner if need be.   Eliezer Bottom, NP 3/15/20171:31 PM

## 2015-10-09 NOTE — Telephone Encounter (Signed)
Patient stating that the rash has not gotten any better, in fact, she feels like it's worse. She wants to come in and be seen again today. Spoke to Dr Marin Olp and we will bring her in to see the NP.  Patient aware of appointment time.

## 2015-10-10 ENCOUNTER — Other Ambulatory Visit: Payer: Self-pay | Admitting: *Deleted

## 2015-10-10 DIAGNOSIS — C3490 Malignant neoplasm of unspecified part of unspecified bronchus or lung: Secondary | ICD-10-CM

## 2015-10-11 ENCOUNTER — Other Ambulatory Visit: Payer: BLUE CROSS/BLUE SHIELD

## 2015-10-11 ENCOUNTER — Ambulatory Visit (HOSPITAL_BASED_OUTPATIENT_CLINIC_OR_DEPARTMENT_OTHER): Payer: BLUE CROSS/BLUE SHIELD | Admitting: Family

## 2015-10-11 ENCOUNTER — Ambulatory Visit: Payer: BLUE CROSS/BLUE SHIELD

## 2015-10-11 ENCOUNTER — Encounter: Payer: Self-pay | Admitting: Family

## 2015-10-11 VITALS — BP 142/87 | HR 93 | Temp 98.3°F | Resp 20 | Ht 64.0 in | Wt 175.0 lb

## 2015-10-11 DIAGNOSIS — R21 Rash and other nonspecific skin eruption: Secondary | ICD-10-CM | POA: Diagnosis not present

## 2015-10-11 DIAGNOSIS — C3492 Malignant neoplasm of unspecified part of left bronchus or lung: Secondary | ICD-10-CM

## 2015-10-11 MED ORDER — LORAZEPAM 0.5 MG PO TABS: 0.5000 mg | ORAL_TABLET | Freq: Four times a day (QID) | ORAL | Status: DC | PRN

## 2015-10-11 MED ORDER — PREDNISONE 20 MG PO TABS: 40.0000 mg | ORAL_TABLET | Freq: Two times a day (BID) | ORAL | Status: DC

## 2015-10-11 NOTE — Progress Notes (Signed)
No treatment today per Sarah [place] NP 

## 2015-10-11 NOTE — Progress Notes (Signed)
Hematology and Oncology Follow Up Visit  Carol Buchanan [id number] 20-Aug-1960 55 y.o. 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 She has had a nice response to the steroid and Singulair. Her skin looks much better. The redness is beginning to fade and the itching has improved.  She has had no fever, chills, n/v, dizziness, chest pain, palpitations, abdominal pain or changes in bowel or bladder habits. She still has some SOB and her dry cough is unchanged and tolerable. She has maintained a good appetite and is staying well hydrated. She denies feeling dehydrated.  She has been able to rest well taking an Ativan at night.  No swelling, tenderness, numbness or tingling in her extremities. No c/o joint aches or "bone" pain.    Medications:    Medication List       This list is accurate as of: 10/11/15 10:58 AM.  Always use your most recent med list.               Calcium Carbonate-Vit D-Min 1200-1000 MG-UNIT Chew  Chew 1 tablet by mouth daily.     CENTRUM SILVER PO  Take by mouth.     DULoxetine 60 MG capsule  Commonly known as:  CYMBALTA  Take 60 mg by mouth daily. Reported on 08/06/2015     fluticasone 50 MCG/ACT nasal spray  Commonly known as:  FLONASE  Place 2 sprays into both nostrils daily.     ibuprofen 800 MG tablet  Commonly known as:  ADVIL,MOTRIN  Take 800 mg by mouth every 8 (eight) hours as needed for moderate pain.     LORazepam 0.5 MG tablet  Commonly known as:  ATIVAN  Take 1 tablet (0.5 mg total) by mouth every 6 (six) hours as needed (Nausea or vomiting).     meloxicam 15 MG tablet  Commonly known as:  MOBIC  Take 15 mg by mouth  daily.     montelukast 10 MG tablet  Commonly known as:  SINGULAIR  Take 1 tablet (10 mg total) by mouth at bedtime.     omeprazole 20 MG capsule  Commonly known as:  PRILOSEC  Take 20 mg by mouth daily.     ondansetron 8 MG tablet  Commonly known as:  ZOFRAN  Take 1 tablet (8 mg total) by mouth 2 (two) times daily as needed (Nausea or vomiting).     predniSONE 20 MG tablet  Commonly known as:  DELTASONE  Take 2 tablets (40 mg total) by mouth 2 (two) times daily with a meal.     PREMPRO 0.625-2.5 MG tablet  Generic drug:  estrogen (conjugated)-medroxyprogesterone     PROAIR HFA 108 (90 Base) MCG/ACT inhaler  Generic drug:  albuterol  inhale 1 puff every 4 hours if needed for shortness of breath     prochlorperazine 10 MG tablet  Commonly known as:  COMPAZINE  Take 1 tablet (10 mg total) by mouth every 6 (six) hours as needed (Nausea or vomiting).     simvastatin 20 MG tablet  Commonly known as:  ZOCOR  Take 20 mg by mouth daily.     Tiotropium Bromide-Olodaterol 2.5-2.5 MCG/ACT Aers  Commonly known as:  STIOLTO RESPIMAT  Inhale 2 puffs into the lungs daily.     venlafaxine XR 150 MG 24 hr capsule  Commonly known as:  EFFEXOR-XR  Take 150 mg by mouth daily. Reported on 08/06/2015     verapamil 180 MG (CO) 24 hr tablet  Commonly known as:  COVERA HS  Take 180 mg by mouth daily.     vitamin C 1000 MG tablet  Take 1,000 mg by mouth daily.     vitamin E 400 UNIT capsule  Take 400 Units by mouth daily.        Allergies: No Known Allergies  Past Medical History, Surgical history, Social history, and Family History were reviewed and updated.  Review of Systems: All other 10 point review of systems is negative.   Physical Exam:  height is '5\' 4"'$  (1.626 m) and weight is 175 lb (79.379 kg). Her oral temperature is 98.3 F (36.8 C). Her blood pressure is 142/87 and her pulse is 93. Her respiration is 20.   Wt Readings from Last 3 Encounters:  10/11/15 175 lb  (79.379 kg)  10/09/15 173 lb (78.472 kg)  10/07/15 172 lb (78.019 kg)    Ocular: Sclerae unicteric, pupils equal, round and reactive to light Ear-nose-throat: Oropharynx clear, dentition fair Lymphatic: No cervical supraclavicular or axillary adenopathy Lungs no rales or rhonchi, good excursion bilaterally Heart regular rate and rhythm, no murmur appreciated Abd soft, nontender, positive bowel sounds, no liver or spleen tip palpated on exam, no fluid wave MSK no focal spinal tenderness, no joint edema Neuro: non-focal, well-oriented, appropriate affect Breasts: Deferred  Lab Results  Component Value Date   WBC 10.9* 10/09/2015   HGB 13.7 10/09/2015   HCT 39.2 10/09/2015   MCV 94 10/09/2015   PLT 281 10/09/2015   No results found for: FERRITIN, IRON, TIBC, UIBC, IRONPCTSAT Lab Results  Component Value Date   RBC 4.18 10/09/2015   No results found for: KPAFRELGTCHN, LAMBDASER, KAPLAMBRATIO No results found for: IGGSERUM, IGA, IGMSERUM No results found for: Odetta Pink, SPEI   Chemistry      Component Value Date/Time   NA 139 10/09/2015 1224   NA 135* 07/26/2015 1159   NA 139 06/21/2015 1836   K 3.3 10/09/2015 1224   K 4.9 07/26/2015 1159   K 3.3* 06/21/2015 1836   CL 103 10/09/2015 1224   CL 105 06/21/2015 1836   CO2 23 10/09/2015 1224   CO2 21* 07/26/2015 1159   CO2 25 06/21/2015 1836   BUN 16 10/09/2015 1224   BUN 20.6 07/26/2015 1159   BUN 10 06/21/2015 1836   CREATININE 0.9 10/09/2015 1224   CREATININE 0.8 07/26/2015 1159   CREATININE 0.60 06/21/2015 1836      Component Value Date/Time   CALCIUM 9.2 10/09/2015 1224   CALCIUM 9.3 07/26/2015 1159   CALCIUM 9.4 06/21/2015 1836   ALKPHOS 51 10/09/2015 1224   ALKPHOS 120 07/26/2015 1159   AST 31 10/09/2015 1224   AST 24 07/26/2015 1159   ALT 27 10/09/2015 1224   ALT 28 07/26/2015 1159   BILITOT 0.60 10/09/2015 1224   BILITOT 0.45 07/26/2015 1159      Impression and Plan: Carol Buchanan is a 55 yo white female with metastatic poorly differentiated carcinoma of the left lung and wild-type for the typical genetic mutations. She has completed 2 cycles of Keytruda and developed a diffuse maculopapular rash all over her body. This has improved with steroids, prevacid and Singulair. It is fading  nicely and the itching is much better.  She will continue on the same dose of Prednisone, Singulair and Prevacid.  She will come in on Monday (not scheduled) so we can see her rash and then hopefully begin to taper her steroid.  We will hold her treatment today and plan to resume next Friday if her rash has resolved.  She will contact us with any questions or concerns. We can certainly see her sooner if need be.   Eliezer Bottom, NP 3/17/201710:58 AM

## 2015-10-14 ENCOUNTER — Other Ambulatory Visit: Payer: Self-pay | Admitting: Family

## 2015-10-14 DIAGNOSIS — R21 Rash and other nonspecific skin eruption: Secondary | ICD-10-CM

## 2015-10-14 MED ORDER — DOXYCYCLINE HYCLATE 100 MG PO TABS: 100.0000 mg | ORAL_TABLET | Freq: Two times a day (BID) | ORAL | Status: DC

## 2015-10-18 ENCOUNTER — Ambulatory Visit (HOSPITAL_BASED_OUTPATIENT_CLINIC_OR_DEPARTMENT_OTHER): Payer: BLUE CROSS/BLUE SHIELD | Admitting: Family

## 2015-10-18 ENCOUNTER — Other Ambulatory Visit (HOSPITAL_BASED_OUTPATIENT_CLINIC_OR_DEPARTMENT_OTHER): Payer: BLUE CROSS/BLUE SHIELD

## 2015-10-18 ENCOUNTER — Ambulatory Visit (HOSPITAL_BASED_OUTPATIENT_CLINIC_OR_DEPARTMENT_OTHER): Payer: BLUE CROSS/BLUE SHIELD

## 2015-10-18 ENCOUNTER — Other Ambulatory Visit: Payer: Self-pay | Admitting: Family

## 2015-10-18 ENCOUNTER — Encounter: Payer: Self-pay | Admitting: Family

## 2015-10-18 VITALS — BP 139/72 | HR 81 | Temp 98.2°F | Resp 16 | Ht 64.0 in | Wt 174.0 lb

## 2015-10-18 DIAGNOSIS — C3492 Malignant neoplasm of unspecified part of left bronchus or lung: Secondary | ICD-10-CM

## 2015-10-18 DIAGNOSIS — C3490 Malignant neoplasm of unspecified part of unspecified bronchus or lung: Secondary | ICD-10-CM

## 2015-10-18 DIAGNOSIS — Z5112 Encounter for antineoplastic immunotherapy: Secondary | ICD-10-CM | POA: Diagnosis not present

## 2015-10-18 DIAGNOSIS — R21 Rash and other nonspecific skin eruption: Secondary | ICD-10-CM

## 2015-10-18 LAB — CBC WITH DIFFERENTIAL (CANCER CENTER ONLY)
BASO#: 0 10*3/uL (ref 0.0–0.2)
BASO%: 0.2 % (ref 0.0–2.0)
EOS%: 0.1 % (ref 0.0–7.0)
Eosinophils Absolute: 0 10*3/uL (ref 0.0–0.5)
HEMATOCRIT: 39.8 % (ref 34.8–46.6)
HGB: 13.6 g/dL (ref 11.6–15.9)
LYMPH#: 0.5 10*3/uL — AB (ref 0.9–3.3)
LYMPH%: 3.8 % — AB (ref 14.0–48.0)
MCH: 32.8 pg (ref 26.0–34.0)
MCHC: 34.2 g/dL (ref 32.0–36.0)
MCV: 96 fL (ref 81–101)
MONO#: 0.6 10*3/uL (ref 0.1–0.9)
MONO%: 4.7 % (ref 0.0–13.0)
NEUT#: 12 10*3/uL — ABNORMAL HIGH (ref 1.5–6.5)
NEUT%: 91.2 % — AB (ref 39.6–80.0)
PLATELETS: 247 10*3/uL (ref 145–400)
RBC: 4.15 10*6/uL (ref 3.70–5.32)
RDW: 13.5 % (ref 11.1–15.7)
WBC: 13.2 10*3/uL — ABNORMAL HIGH (ref 3.9–10.0)

## 2015-10-18 LAB — CMP (CANCER CENTER ONLY)
ALT(SGPT): 41 U/L (ref 10–47)
AST: 24 U/L (ref 11–38)
Albumin: 3.5 g/dL (ref 3.3–5.5)
Alkaline Phosphatase: 39 U/L (ref 26–84)
BILIRUBIN TOTAL: 0.7 mg/dL (ref 0.20–1.60)
BUN, Bld: 23 mg/dL — ABNORMAL HIGH (ref 7–22)
CALCIUM: 9 mg/dL (ref 8.0–10.3)
CO2: 25 meq/L (ref 18–33)
Chloride: 100 mEq/L (ref 98–108)
Creat: 1 mg/dl (ref 0.6–1.2)
GLUCOSE: 131 mg/dL — AB (ref 73–118)
POTASSIUM: 3.5 meq/L (ref 3.3–4.7)
Sodium: 134 mEq/L (ref 128–145)
Total Protein: 6.3 g/dL — ABNORMAL LOW (ref 6.4–8.1)

## 2015-10-18 MED ORDER — PREDNISONE 20 MG PO TABS: 20.0000 mg | ORAL_TABLET | Freq: Every day | ORAL | Status: DC

## 2015-10-18 MED ORDER — SODIUM CHLORIDE 0.9 % IV SOLN
200.0000 mg | Freq: Once | INTRAVENOUS | Status: AC
  Administered 2015-10-18: 200 mg via INTRAVENOUS
  Filled 2015-10-18: qty 8

## 2015-10-18 MED ORDER — SODIUM CHLORIDE 0.9 % IV SOLN
Freq: Once | INTRAVENOUS | Status: AC
  Administered 2015-10-18: 13:00:00 via INTRAVENOUS

## 2015-10-18 MED ORDER — PREDNISONE 20 MG PO TABS: 20.0000 mg | ORAL_TABLET | Freq: Two times a day (BID) | ORAL | Status: DC

## 2015-10-18 NOTE — Patient Instructions (Signed)
Pembrolizumab injection What is this medicine? PEMBROLIZUMAB (pem broe liz ue mab) is a monoclonal antibody. It is used to treat melanoma and non-small cell lung cancer. This medicine may be used for other purposes; ask your health care provider or pharmacist if you have questions. What should I tell my health care provider before I take this medicine? They need to know if you have any of these conditions: -diabetes -immune system problems -inflammatory bowel disease -liver disease -lung or breathing disease -lupus -an unusual or allergic reaction to pembrolizumab, other medicines, foods, dyes, or preservatives -pregnant or trying to get pregnant -breast-feeding How should I use this medicine? This medicine is for infusion into a vein. It is given by a health care professional in a hospital or clinic setting. A special MedGuide will be given to you before each treatment. Be sure to read this information carefully each time. Talk to your pediatrician regarding the use of this medicine in children. Special care may be needed. Overdosage: If you think you have taken too much of this medicine contact a poison control center or emergency room at once. NOTE: This medicine is only for you. Do not share this medicine with others. What if I miss a dose? It is important not to miss your dose. Call your doctor or health care professional if you are unable to keep an appointment. What may interact with this medicine? Interactions have not been studied. Give your health care provider a list of all the medicines, herbs, non-prescription drugs, or dietary supplements you use. Also tell them if you smoke, drink alcohol, or use illegal drugs. Some items may interact with your medicine. This list may not describe all possible interactions. Give your health care provider a list of all the medicines, herbs, non-prescription drugs, or dietary supplements you use. Also tell them if you smoke, drink alcohol, or  use illegal drugs. Some items may interact with your medicine. What should I watch for while using this medicine? Your condition will be monitored carefully while you are receiving this medicine. You may need blood work done while you are taking this medicine. Do not become pregnant while taking this medicine or for 4 months after stopping it. Women should inform their doctor if they wish to become pregnant or think they might be pregnant. There is a potential for serious side effects to an unborn child. Talk to your health care professional or pharmacist for more information. Do not breast-feed an infant while taking this medicine or for 4 months after the last dose. What side effects may I notice from receiving this medicine? Side effects that you should report to your doctor or health care professional as soon as possible: -allergic reactions like skin rash, itching or hives, swelling of the face, lips, or tongue -bloody or black, tarry stools -breathing problems -change in the amount of urine -changes in vision -chest pain -chills -dark urine -dizziness or feeling faint or lightheaded -fast or irregular heartbeat -fever -flushing -hair loss -muscle pain -muscle weakness -persistent headache -signs and symptoms of high blood sugar such as dizziness; dry mouth; dry skin; fruity breath; nausea; stomach pain; increased hunger or thirst; increased urination -signs and symptoms of liver injury like dark urine, light-colored stools, loss of appetite, nausea, right upper belly pain, yellowing of the eyes or skin -stomach pain -weight loss Side effects that usually do not require medical attention (Report these to your doctor or health care professional if they continue or are bothersome.):constipation -cough -diarrhea -joint pain -  tiredness This list may not describe all possible side effects. Call your doctor for medical advice about side effects. You may report side effects to FDA at  1-800-FDA-1088. Where should I keep my medicine? This drug is given in a hospital or clinic and will not be stored at home. NOTE: This sheet is a summary. It may not cover all possible information. If you have questions about this medicine, talk to your doctor, pharmacist, or health care provider.    2016, Elsevier/Gold Standard. (2014-09-11 17:24:19)

## 2015-10-18 NOTE — Progress Notes (Signed)
Hematology and Oncology Follow Up Visit  Carol Buchanan [id number] 11/01/1960 55 y.o. 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 here today for follow-up regarding her diffuse skin rash. It has almost completely resolved. She still has a few places on her forehead and cheeks. The itching has stopped. She has been taking 20 mg BID.  No fever, chills, n/v, dizziness, chest pain, palpitations, abdominal pain or changes in bowel or bladder habits. She still has some SOB and her dry cough is unchanged and tolerable. She has maintained a good appetite and is staying well hydrated. Her weight is unchanged.  No swelling, tenderness, numbness or tingling in her extremities. No c/o joint aches or "bone" pain.    Medications:    Medication List       This list is accurate as of: 10/18/15  3:33 PM.  Always use your most recent med list.               Calcium Carbonate-Vit D-Min 1200-1000 MG-UNIT Chew  Chew 1 tablet by mouth daily.     CENTRUM SILVER PO  Take by mouth.     doxycycline 100 MG tablet  Commonly known as:  VIBRA-TABS  Take 1 tablet (100 mg total) by mouth 2 (two) times daily.     DULoxetine 60 MG capsule  Commonly known as:  CYMBALTA  Take 60 mg by mouth daily. Reported on 08/06/2015     fluticasone 50 MCG/ACT nasal spray  Commonly known as:  FLONASE  Place 2 sprays into both nostrils daily.     ibuprofen 800 MG tablet  Commonly known as:  ADVIL,MOTRIN  Take 800 mg by mouth every 8 (eight) hours as needed for moderate pain.     LORazepam 0.5 MG tablet  Commonly known as:  ATIVAN  Take 1 tablet (0.5 mg total) by mouth every 6 (six) hours as needed (Nausea or vomiting).     meloxicam 15 MG tablet    Commonly known as:  MOBIC  Take 15 mg by mouth daily.     montelukast 10 MG tablet  Commonly known as:  SINGULAIR  Take 1 tablet (10 mg total) by mouth at bedtime.     omeprazole 20 MG capsule  Commonly known as:  PRILOSEC  Take 20 mg by mouth daily.     ondansetron 8 MG tablet  Commonly known as:  ZOFRAN  Take 1 tablet (8 mg total) by mouth 2 (two) times daily as needed (Nausea or vomiting).     predniSONE 20 MG tablet  Commonly known as:  DELTASONE  Take 1 tablet (20 mg total) by mouth daily with breakfast.     PREMPRO 0.625-2.5 MG tablet  Generic drug:  estrogen (conjugated)-medroxyprogesterone     PROAIR HFA 108 (90 Base) MCG/ACT inhaler  Generic drug:  albuterol  inhale 1 puff every 4 hours if needed for shortness of breath     prochlorperazine 10 MG tablet  Commonly known as:  COMPAZINE  Take 1 tablet (10 mg total) by mouth every 6 (six) hours as needed (Nausea or vomiting).     simvastatin 20 MG tablet  Commonly known as:  ZOCOR  Take 20 mg by mouth daily.     Tiotropium Bromide-Olodaterol 2.5-2.5  MCG/ACT Aers  Commonly known as:  STIOLTO RESPIMAT  Inhale 2 puffs into the lungs daily.     venlafaxine XR 150 MG 24 hr capsule  Commonly known as:  EFFEXOR-XR  Take 150 mg by mouth daily. Reported on 08/06/2015     verapamil 180 MG (CO) 24 hr tablet  Commonly known as:  COVERA HS  Take 180 mg by mouth daily.     vitamin C 1000 MG tablet  Take 1,000 mg by mouth daily.     vitamin E 400 UNIT capsule  Take 400 Units by mouth daily.        Allergies: No Known Allergies  Past Medical History, Surgical history, Social history, and Family History were reviewed and updated.  Review of Systems: All other 10 point review of systems is negative.   Physical Exam:  vitals were not taken for this visit.  Wt Readings from Last 3 Encounters:  10/18/15 174 lb (78.926 kg)  10/11/15 175 lb (79.379 kg)  10/09/15 173 lb (78.472 kg)    Ocular: Sclerae unicteric,  pupils equal, round and reactive to light Ear-nose-throat: Oropharynx clear, dentition fair Lymphatic: No cervical supraclavicular or axillary adenopathy Lungs no rales or rhonchi, good excursion bilaterally Heart regular rate and rhythm, no murmur appreciated Abd soft, nontender, positive bowel sounds, no liver or spleen tip palpated on exam, no fluid wave MSK no focal spinal tenderness, no joint edema Neuro: non-focal, well-oriented, appropriate affect Breasts: Deferred  Lab Results  Component Value Date   WBC 13.2* 10/18/2015   HGB 13.6 10/18/2015   HCT 39.8 10/18/2015   MCV 96 10/18/2015   PLT 247 10/18/2015   No results found for: FERRITIN, IRON, TIBC, UIBC, IRONPCTSAT Lab Results  Component Value Date   RBC 4.15 10/18/2015   No results found for: KPAFRELGTCHN, LAMBDASER, KAPLAMBRATIO No results found for: Kandis Cocking, IGMSERUM No results found for: Odetta Pink, SPEI   Chemistry      Component Value Date/Time   NA 134 10/18/2015 1212   NA 135* 07/26/2015 1159   NA 139 06/21/2015 1836   K 3.5 10/18/2015 1212   K 4.9 07/26/2015 1159   K 3.3* 06/21/2015 1836   CL 100 10/18/2015 1212   CL 105 06/21/2015 1836   CO2 25 10/18/2015 1212   CO2 21* 07/26/2015 1159   CO2 25 06/21/2015 1836   BUN 23* 10/18/2015 1212   BUN 20.6 07/26/2015 1159   BUN 10 06/21/2015 1836   CREATININE 1.0 10/18/2015 1212   CREATININE 0.8 07/26/2015 1159   CREATININE 0.60 06/21/2015 1836      Component Value Date/Time   CALCIUM 9.0 10/18/2015 1212   CALCIUM 9.3 07/26/2015 1159   CALCIUM 9.4 06/21/2015 1836   ALKPHOS 39 10/18/2015 1212   ALKPHOS 120 07/26/2015 1159   AST 24 10/18/2015 1212   AST 24 07/26/2015 1159   ALT 41 10/18/2015 1212   ALT 28 07/26/2015 1159   BILITOT 0.70 10/18/2015 1212   BILITOT 0.45 07/26/2015 1159     Impression and Plan: Carol Buchanan is a 55 yo white female with metastatic poorly differentiated  carcinoma of the left lung and wild-type for the typical genetic mutations. She has completed 2 cycles of Keytruda and developed a diffuse maculopapular rash all over her body. This has resolved with steroids, prevacid and Singulair.  We will have her decrease her Prednisone to 20 mg daily.  She would like to be treated today. I spoke  with Dr. Marin Olp and he is ok with proceeding with treatment today.  We will plan to see her back in 1 week to see how she is feeling.  She will contact us with any questions or concerns. We can certainly see her sooner if need be.   Eliezer Bottom, NP 3/24/20173:33 PM

## 2015-10-18 NOTE — Progress Notes (Signed)
Hematology and Oncology Follow Up Visit  Carol Buchanan [id number] Nov 09, 1960 55 y.o. 10/18/2015   Principle Diagnosis:  Metastatic poorly differentiated carcinoma of the left lung-(-) for EGFR/ALK/ROS PD-L1 (+)  Current Therapy:   Patient status post radiation therapy Xgeva 120 mg subcutaneous monthly Pembrolizumab 239m IV q 3wks - s/p cycle 2    Interim History:  Ms. DPiscitelliis here today for follow-up regarding her diffuse skin rash. It has almost completely resolved. She still has a few places on her forehead and cheeks. The itching has stopped. She has been taking 20 mg BID.  No fever, chills, n/v, dizziness, chest pain, palpitations, abdominal pain or changes in bowel or bladder habits. She still has some SOB and her dry cough is unchanged and tolerable. She has maintained a good appetite and is staying well hydrated. Her weight is unchanged.  No swelling, tenderness, numbness or tingling in her extremities. No c/o joint aches or "bone" pain.    Medications:    Medication List       This list is accurate as of: 10/18/15 12:17 PM.  Always use your most recent med list.               Calcium Carbonate-Vit D-Min 1200-1000 MG-UNIT Chew  Chew 1 tablet by mouth daily.     CENTRUM SILVER PO  Take by mouth.     doxycycline 100 MG tablet  Commonly known as:  VIBRA-TABS  Take 1 tablet (100 mg total) by mouth 2 (two) times daily.     DULoxetine 60 MG capsule  Commonly known as:  CYMBALTA  Take 60 mg by mouth daily. Reported on 08/06/2015     fluticasone 50 MCG/ACT nasal spray  Commonly known as:  FLONASE  Place 2 sprays into both nostrils daily.     ibuprofen 800 MG tablet  Commonly known as:  ADVIL,MOTRIN  Take 800 mg by mouth every 8 (eight) hours as needed for moderate pain.     LORazepam 0.5 MG tablet  Commonly known as:  ATIVAN  Take 1 tablet (0.5 mg total) by mouth every 6 (six) hours as needed (Nausea or vomiting).     meloxicam 15 MG tablet    Commonly known as:  MOBIC  Take 15 mg by mouth daily.     montelukast 10 MG tablet  Commonly known as:  SINGULAIR  Take 1 tablet (10 mg total) by mouth at bedtime.     omeprazole 20 MG capsule  Commonly known as:  PRILOSEC  Take 20 mg by mouth daily.     ondansetron 8 MG tablet  Commonly known as:  ZOFRAN  Take 1 tablet (8 mg total) by mouth 2 (two) times daily as needed (Nausea or vomiting).     predniSONE 20 MG tablet  Commonly known as:  DELTASONE  Take 2 tablets (40 mg total) by mouth 2 (two) times daily with a meal.     PREMPRO 0.625-2.5 MG tablet  Generic drug:  estrogen (conjugated)-medroxyprogesterone     PROAIR HFA 108 (90 Base) MCG/ACT inhaler  Generic drug:  albuterol  inhale 1 puff every 4 hours if needed for shortness of breath     prochlorperazine 10 MG tablet  Commonly known as:  COMPAZINE  Take 1 tablet (10 mg total) by mouth every 6 (six) hours as needed (Nausea or vomiting).     simvastatin 20 MG tablet  Commonly known as:  ZOCOR  Take 20 mg by mouth daily.  Tiotropium Bromide-Olodaterol 2.5-2.5 MCG/ACT Aers  Commonly known as:  STIOLTO RESPIMAT  Inhale 2 puffs into the lungs daily.     venlafaxine XR 150 MG 24 hr capsule  Commonly known as:  EFFEXOR-XR  Take 150 mg by mouth daily. Reported on 08/06/2015     verapamil 180 MG (CO) 24 hr tablet  Commonly known as:  COVERA HS  Take 180 mg by mouth daily.     vitamin C 1000 MG tablet  Take 1,000 mg by mouth daily.     vitamin E 400 UNIT capsule  Take 400 Units by mouth daily.        Allergies: No Known Allergies  Past Medical History, Surgical history, Social history, and Family History were reviewed and updated.  Review of Systems: All other 10 point review of systems is negative.   Physical Exam:  vitals were not taken for this visit.  Wt Readings from Last 3 Encounters:  10/11/15 175 lb (79.379 kg)  10/09/15 173 lb (78.472 kg)  10/07/15 172 lb (78.019 kg)    Ocular: Sclerae  unicteric, pupils equal, round and reactive to light Ear-nose-throat: Oropharynx clear, dentition fair Lymphatic: No cervical supraclavicular or axillary adenopathy Lungs no rales or rhonchi, good excursion bilaterally Heart regular rate and rhythm, no murmur appreciated Abd soft, nontender, positive bowel sounds, no liver or spleen tip palpated on exam, no fluid wave MSK no focal spinal tenderness, no joint edema Neuro: non-focal, well-oriented, appropriate affect Breasts: Deferred  Lab Results  Component Value Date   WBC 10.9* 10/09/2015   HGB 13.7 10/09/2015   HCT 39.2 10/09/2015   MCV 94 10/09/2015   PLT 281 10/09/2015   No results found for: FERRITIN, IRON, TIBC, UIBC, IRONPCTSAT Lab Results  Component Value Date   RBC 4.18 10/09/2015   No results found for: KPAFRELGTCHN, LAMBDASER, KAPLAMBRATIO No results found for: IGGSERUM, IGA, IGMSERUM No results found for: Odetta Pink, SPEI   Chemistry      Component Value Date/Time   NA 139 10/09/2015 1224   NA 135* 07/26/2015 1159   NA 139 06/21/2015 1836   K 3.3 10/09/2015 1224   K 4.9 07/26/2015 1159   K 3.3* 06/21/2015 1836   CL 103 10/09/2015 1224   CL 105 06/21/2015 1836   CO2 23 10/09/2015 1224   CO2 21* 07/26/2015 1159   CO2 25 06/21/2015 1836   BUN 16 10/09/2015 1224   BUN 20.6 07/26/2015 1159   BUN 10 06/21/2015 1836   CREATININE 0.9 10/09/2015 1224   CREATININE 0.8 07/26/2015 1159   CREATININE 0.60 06/21/2015 1836      Component Value Date/Time   CALCIUM 9.2 10/09/2015 1224   CALCIUM 9.3 07/26/2015 1159   CALCIUM 9.4 06/21/2015 1836   ALKPHOS 51 10/09/2015 1224   ALKPHOS 120 07/26/2015 1159   AST 31 10/09/2015 1224   AST 24 07/26/2015 1159   ALT 27 10/09/2015 1224   ALT 28 07/26/2015 1159   BILITOT 0.60 10/09/2015 1224   BILITOT 0.45 07/26/2015 1159     Impression and Plan: Carol Buchanan is a 55 yo white female with metastatic poorly  differentiated carcinoma of the left lung and wild-type for the typical genetic mutations. She has completed 2 cycles of Keytruda and developed a diffuse maculopapular rash all over her body. This has resolved with steroids, prevacid and Singulair.  We will have her decrease her Prednisone to 20 mg daily.  She would like to be treated  today. I spoke with Dr. Marin Olp and he is ok with proceeding with treatment today.  We will plan to see her back in 1 week to see how she is feeling.  She will contact us with any questions or concerns. We can certainly see her sooner if need be.   Eliezer Bottom, NP 3/24/201712:17 PM

## 2015-10-24 ENCOUNTER — Other Ambulatory Visit: Payer: Self-pay | Admitting: Family

## 2015-10-25 ENCOUNTER — Encounter: Payer: Self-pay | Admitting: Family

## 2015-10-25 ENCOUNTER — Ambulatory Visit (HOSPITAL_BASED_OUTPATIENT_CLINIC_OR_DEPARTMENT_OTHER): Payer: BLUE CROSS/BLUE SHIELD | Admitting: Family

## 2015-10-25 ENCOUNTER — Other Ambulatory Visit (HOSPITAL_BASED_OUTPATIENT_CLINIC_OR_DEPARTMENT_OTHER): Payer: BLUE CROSS/BLUE SHIELD

## 2015-10-25 VITALS — BP 137/72 | HR 84 | Temp 98.3°F | Resp 16 | Ht 64.0 in | Wt 171.0 lb

## 2015-10-25 DIAGNOSIS — R21 Rash and other nonspecific skin eruption: Secondary | ICD-10-CM

## 2015-10-25 DIAGNOSIS — C3492 Malignant neoplasm of unspecified part of left bronchus or lung: Secondary | ICD-10-CM | POA: Diagnosis not present

## 2015-10-25 DIAGNOSIS — C3490 Malignant neoplasm of unspecified part of unspecified bronchus or lung: Secondary | ICD-10-CM

## 2015-10-25 LAB — COMPREHENSIVE METABOLIC PANEL
ALT: 47 U/L (ref 0–55)
AST: 19 U/L (ref 5–34)
Albumin: 4.1 g/dL (ref 3.5–5.0)
Alkaline Phosphatase: 41 U/L (ref 40–150)
Anion Gap: 11 mEq/L (ref 3–11)
BUN: 17.9 mg/dL (ref 7.0–26.0)
CALCIUM: 9.4 mg/dL (ref 8.4–10.4)
CHLORIDE: 103 meq/L (ref 98–109)
CO2: 22 meq/L (ref 22–29)
CREATININE: 0.8 mg/dL (ref 0.6–1.1)
EGFR: 79 mL/min/{1.73_m2} — ABNORMAL LOW (ref >90)
GLUCOSE: 101 mg/dL (ref 70–140)
Potassium: 4.8 mEq/L (ref 3.5–5.1)
Sodium: 136 mEq/L (ref 136–145)
Total Bilirubin: 0.88 mg/dL (ref 0.20–1.20)
Total Protein: 7.1 g/dL (ref 6.4–8.3)

## 2015-10-25 LAB — CBC WITH DIFFERENTIAL (CANCER CENTER ONLY)
BASO#: 0.1 10*3/uL (ref 0.0–0.2)
BASO%: 0.4 % (ref 0.0–2.0)
EOS%: 0.2 % (ref 0.0–7.0)
Eosinophils Absolute: 0 10*3/uL (ref 0.0–0.5)
HEMATOCRIT: 41.1 % (ref 34.8–46.6)
HGB: 14.2 g/dL (ref 11.6–15.9)
LYMPH#: 0.6 10*3/uL — AB (ref 0.9–3.3)
LYMPH%: 4.5 % — ABNORMAL LOW (ref 14.0–48.0)
MCH: 33.1 pg (ref 26.0–34.0)
MCHC: 34.5 g/dL (ref 32.0–36.0)
MCV: 96 fL (ref 81–101)
MONO#: 0.4 10*3/uL (ref 0.1–0.9)
MONO%: 3.2 % (ref 0.0–13.0)
NEUT#: 11.3 10*3/uL — ABNORMAL HIGH (ref 1.5–6.5)
NEUT%: 91.7 % — AB (ref 39.6–80.0)
PLATELETS: 188 10*3/uL (ref 145–400)
RBC: 4.29 10*6/uL (ref 3.70–5.32)
RDW: 13.7 % (ref 11.1–15.7)
WBC: 12.3 10*3/uL — ABNORMAL HIGH (ref 3.9–10.0)

## 2015-10-25 NOTE — Progress Notes (Signed)
Hematology and Oncology Follow Up Visit  Carol Buchanan [id number] [birth year] 55 y.o. 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 She is feeling much better. Her rash has completely resolved. She had a few episodes of nausea, no vomiting, that resolved with antiemetics.  She states that the Prednisone is making her "jittery." She is on 20 mg a day so we will have her stop this.   No fever, chills, n/v, rash, dizziness, chest pain, palpitations, abdominal pain or changes in bowel or bladder habits. She still has some SOB with exertion and her dry cough is unchanged. No swelling, tenderness, numbness or tingling in her extremities. No c/o joint aches or "bone" pain. She has maintained a good appetite and is staying well hydrated. Her weight is unchanged.     Medications:    Medication List       This list is accurate as of: 10/25/15  2:55 PM.  Always use your most recent med list.               Calcium Carbonate-Vit D-Min 1200-1000 MG-UNIT Chew  Chew 1 tablet by mouth daily.     CENTRUM SILVER PO  Take by mouth.     doxycycline 100 MG tablet  Commonly known as:  VIBRA-TABS  Take 1 tablet (100 mg total) by mouth 2 (two) times daily.     DULoxetine 60 MG capsule  Commonly known as:  CYMBALTA  Take 60 mg by mouth daily. Reported on 08/06/2015     fluticasone 50 MCG/ACT nasal spray  Commonly known as:  FLONASE  Place 2 sprays into both nostrils daily.     ibuprofen 800 MG tablet  Commonly known as:  ADVIL,MOTRIN  Take 800 mg by mouth every 8 (eight) hours as needed for moderate pain.     LORazepam 0.5 MG tablet  Commonly known as:  ATIVAN  Take 1 tablet (0.5 mg total)  by mouth every 6 (six) hours as needed (Nausea or vomiting).     meloxicam 15 MG tablet  Commonly known as:  MOBIC  Take 15 mg by mouth daily.     montelukast 10 MG tablet  Commonly known as:  SINGULAIR  Take 1 tablet (10 mg total) by mouth at bedtime.     omeprazole 20 MG capsule  Commonly known as:  PRILOSEC  Take 20 mg by mouth daily.     ondansetron 8 MG tablet  Commonly known as:  ZOFRAN  Take 1 tablet (8 mg total) by mouth 2 (two) times daily as needed (Nausea or vomiting).     predniSONE 20 MG tablet  Commonly known as:  DELTASONE  Take 1 tablet (20 mg total) by mouth daily with breakfast.     PREMPRO 0.625-2.5 MG tablet  Generic drug:  estrogen (conjugated)-medroxyprogesterone     PROAIR HFA 108 (90 Base) MCG/ACT inhaler  Generic drug:  albuterol  inhale 1 puff every 4 hours if needed for shortness of breath     prochlorperazine 10 MG tablet  Commonly known as:  COMPAZINE  Take 1 tablet (10 mg total) by mouth every 6 (six) hours as needed (Nausea or vomiting).     simvastatin 20 MG tablet  Commonly known as:  ZOCOR  Take 20 mg by mouth daily.     Tiotropium Bromide-Olodaterol 2.5-2.5 MCG/ACT Aers  Commonly known as:  STIOLTO RESPIMAT  Inhale 2 puffs into the lungs daily.     venlafaxine XR 150 MG 24 hr capsule  Commonly known as:  EFFEXOR-XR  Take 150 mg by mouth daily. Reported on 08/06/2015     verapamil 180 MG (CO) 24 hr tablet  Commonly known as:  COVERA HS  Take 180 mg by mouth daily.     vitamin C 1000 MG tablet  Take 1,000 mg by mouth daily.     vitamin E 400 UNIT capsule  Take 400 Units by mouth daily.        Allergies: No Known Allergies  Past Medical History, Surgical history, Social history, and Family History were reviewed and updated.  Review of Systems: All other 10 point review of systems is negative.   Physical Exam:  height is _0  (1.626 m) and weight is 171 lb (77.565 kg). Her oral temperature is 98.3 F (36.8 C). Her blood  pressure is 137/72 and her pulse is 84. Her respiration is 16.   Wt Readings from Last 3 Encounters:  10/25/15 171 lb (77.565 kg)  10/18/15 174 lb (78.926 kg)  10/11/15 175 lb (79.379 kg)    Ocular: Sclerae unicteric, pupils equal, round and reactive to light Ear-nose-throat: Oropharynx clear, dentition fair Lymphatic: No cervical supraclavicular or axillary adenopathy Lungs no rales or rhonchi, good excursion bilaterally Heart regular rate and rhythm, no murmur appreciated Abd soft, nontender, positive bowel sounds, no liver or spleen tip palpated on exam, no fluid wave MSK no focal spinal tenderness, no joint edema Neuro: non-focal, well-oriented, appropriate affect Breasts: Deferred  Lab Results  Component Value Date   WBC 12.3* 10/25/2015   HGB 14.2 10/25/2015   HCT 41.1 10/25/2015   MCV 96 10/25/2015   PLT 188 10/25/2015   No results found for: FERRITIN, IRON, TIBC, UIBC, IRONPCTSAT Lab Results  Component Value Date   RBC 4.29 10/25/2015   No results found for: KPAFRELGTCHN, LAMBDASER, KAPLAMBRATIO No results found for: Kandis Cocking, IGMSERUM No results found for: Odetta Pink, SPEI   Chemistry      Component Value Date/Time   NA 134 10/18/2015 1212   NA 135* 07/26/2015 1159   NA 139 06/21/2015 1836   K 3.5 10/18/2015 1212   K 4.9 07/26/2015 1159   K 3.3* 06/21/2015 1836   CL 100 10/18/2015 1212   CL 105 06/21/2015 1836   CO2 25 10/18/2015 1212   CO2 21* 07/26/2015 1159   CO2 25 06/21/2015 1836   BUN 23* 10/18/2015 1212   BUN 20.6 07/26/2015 1159   BUN 10 06/21/2015 1836   CREATININE 1.0 10/18/2015 1212   CREATININE 0.8 07/26/2015 1159   CREATININE 0.60 06/21/2015 1836      Component Value Date/Time   CALCIUM 9.0 10/18/2015 1212   CALCIUM 9.3 07/26/2015 1159   CALCIUM 9.4 06/21/2015 1836   ALKPHOS 39 10/18/2015 1212   ALKPHOS 120 07/26/2015 1159   AST 24 10/18/2015 1212   AST 24 07/26/2015 1159     ALT 41 10/18/2015 1212   ALT 28 07/26/2015 1159   BILITOT 0.70 10/18/2015 1212   BILITOT 0.45 07/26/2015 1159     Impression and Plan: Carol Buchanan is a 55 yo white female with metastatic poorly differentiated carcinoma of the left lung and wild-type for the typical  genetic mutations. She is doing well and has no complaints at this time other than feeling "jittery" from the Prednisone. We will go ahead and have her stop the prednisone today. Her rash has now resolved.  She will get a new schedule today and we will plan to see her back in 2 weeks for labs, follow-up and treatment.  She will contact us with any questions or concerns. We can certainly see her sooner if need be.   Eliezer Bottom, NP 3/31/20172:55 PM

## 2015-11-01 ENCOUNTER — Ambulatory Visit: Payer: BLUE CROSS/BLUE SHIELD | Admitting: Family

## 2015-11-01 ENCOUNTER — Ambulatory Visit: Payer: BLUE CROSS/BLUE SHIELD

## 2015-11-01 ENCOUNTER — Other Ambulatory Visit: Payer: BLUE CROSS/BLUE SHIELD

## 2015-11-04 DIAGNOSIS — I7090 Unspecified atherosclerosis: Secondary | ICD-10-CM | POA: Insufficient documentation

## 2015-11-04 DIAGNOSIS — C7951 Secondary malignant neoplasm of bone: Secondary | ICD-10-CM | POA: Insufficient documentation

## 2015-11-04 DIAGNOSIS — R0602 Shortness of breath: Secondary | ICD-10-CM | POA: Insufficient documentation

## 2015-11-04 DIAGNOSIS — C3492 Malignant neoplasm of unspecified part of left bronchus or lung: Secondary | ICD-10-CM | POA: Insufficient documentation

## 2015-11-04 DIAGNOSIS — K760 Fatty (change of) liver, not elsewhere classified: Secondary | ICD-10-CM | POA: Insufficient documentation

## 2015-11-04 DIAGNOSIS — R590 Localized enlarged lymph nodes: Secondary | ICD-10-CM | POA: Diagnosis not present

## 2015-11-04 DIAGNOSIS — I2510 Atherosclerotic heart disease of native coronary artery without angina pectoris: Secondary | ICD-10-CM | POA: Diagnosis not present

## 2015-11-04 DIAGNOSIS — R000 Tachycardia, unspecified: Secondary | ICD-10-CM | POA: Diagnosis present

## 2015-11-04 LAB — GLUCOSE, CAPILLARY: GLUCOSE-CAPILLARY: 111 mg/dL — AB (ref 65–99)

## 2015-11-04 MED ORDER — FLUDEOXYGLUCOSE F - 18 (FDG) INJECTION
8.4300 | Freq: Once | INTRAVENOUS | Status: AC | PRN
  Administered 2015-11-04: 8.43 via INTRAVENOUS

## 2015-11-07 ENCOUNTER — Other Ambulatory Visit (HOSPITAL_BASED_OUTPATIENT_CLINIC_OR_DEPARTMENT_OTHER): Payer: BLUE CROSS/BLUE SHIELD

## 2015-11-07 ENCOUNTER — Ambulatory Visit (HOSPITAL_BASED_OUTPATIENT_CLINIC_OR_DEPARTMENT_OTHER)
Admission: RE | Admit: 2015-11-07 | Discharge: 2015-11-07 | Disposition: A | Discharge status: Home / Self Care | Payer: BLUE CROSS/BLUE SHIELD | Source: Ambulatory Visit | Attending: Family | Admitting: Family

## 2015-11-07 ENCOUNTER — Encounter (HOSPITAL_BASED_OUTPATIENT_CLINIC_OR_DEPARTMENT_OTHER): Payer: Self-pay

## 2015-11-07 ENCOUNTER — Ambulatory Visit: Payer: BLUE CROSS/BLUE SHIELD

## 2015-11-07 ENCOUNTER — Encounter: Payer: Self-pay | Admitting: Family

## 2015-11-07 ENCOUNTER — Ambulatory Visit (HOSPITAL_BASED_OUTPATIENT_CLINIC_OR_DEPARTMENT_OTHER): Payer: BLUE CROSS/BLUE SHIELD | Admitting: Family

## 2015-11-07 VITALS — BP 130/69 | HR 110 | Temp 97.8°F | Resp 20 | Ht 64.0 in | Wt 172.0 lb

## 2015-11-07 DIAGNOSIS — C3492 Malignant neoplasm of unspecified part of left bronchus or lung: Secondary | ICD-10-CM

## 2015-11-07 DIAGNOSIS — C3490 Malignant neoplasm of unspecified part of unspecified bronchus or lung: Secondary | ICD-10-CM

## 2015-11-07 DIAGNOSIS — J84114 Acute interstitial pneumonitis: Secondary | ICD-10-CM | POA: Diagnosis not present

## 2015-11-07 DIAGNOSIS — J700 Acute pulmonary manifestations due to radiation: Secondary | ICD-10-CM

## 2015-11-07 DIAGNOSIS — R21 Rash and other nonspecific skin eruption: Secondary | ICD-10-CM

## 2015-11-07 LAB — CBC WITH DIFFERENTIAL (CANCER CENTER ONLY)
BASO#: 0 10*3/uL (ref 0.0–0.2)
BASO%: 0.7 % (ref 0.0–2.0)
EOS ABS: 0.1 10*3/uL (ref 0.0–0.5)
EOS%: 2 % (ref 0.0–7.0)
HCT: 33.5 % — ABNORMAL LOW (ref 34.8–46.6)
HEMOGLOBIN: 11.8 g/dL (ref 11.6–15.9)
LYMPH#: 0.5 10*3/uL — ABNORMAL LOW (ref 0.9–3.3)
LYMPH%: 8.2 % — AB (ref 14.0–48.0)
MCH: 33.9 pg (ref 26.0–34.0)
MCHC: 35.2 g/dL (ref 32.0–36.0)
MCV: 96 fL (ref 81–101)
MONO#: 0.5 10*3/uL (ref 0.1–0.9)
MONO%: 8.2 % (ref 0.0–13.0)
NEUT#: 4.9 10*3/uL (ref 1.5–6.5)
NEUT%: 80.9 % — AB (ref 39.6–80.0)
PLATELETS: 300 10*3/uL (ref 145–400)
RBC: 3.48 10*6/uL — ABNORMAL LOW (ref 3.70–5.32)
RDW: 14.2 % (ref 11.1–15.7)
WBC: 6 10*3/uL (ref 3.9–10.0)

## 2015-11-07 LAB — CMP (CANCER CENTER ONLY)
ALBUMIN: 3.3 g/dL (ref 3.3–5.5)
ALT(SGPT): 30 U/L (ref 10–47)
AST: 25 U/L (ref 11–38)
Alkaline Phosphatase: 78 U/L (ref 26–84)
BUN, Bld: 15 mg/dL (ref 7–22)
CHLORIDE: 101 meq/L (ref 98–108)
CO2: 22 mEq/L (ref 18–33)
CREATININE: 0.9 mg/dL (ref 0.6–1.2)
Calcium: 9.5 mg/dL (ref 8.0–10.3)
Glucose, Bld: 139 mg/dL — ABNORMAL HIGH (ref 73–118)
Potassium: 3.3 mEq/L (ref 3.3–4.7)
SODIUM: 132 meq/L (ref 128–145)
Total Bilirubin: 1 mg/dl (ref 0.20–1.60)
Total Protein: 7.5 g/dL (ref 6.4–8.1)

## 2015-11-07 LAB — TECHNOLOGIST REVIEW CHCC SATELLITE

## 2015-11-07 MED ORDER — DEXAMETHASONE 4 MG PO TABS: 4.0000 mg | ORAL_TABLET | Freq: Two times a day (BID) | ORAL | Status: DC

## 2015-11-07 MED ORDER — ONDANSETRON HCL 4 MG PO TABS: 40.0000 mg | ORAL_TABLET | Freq: Once | ORAL | Status: DC

## 2015-11-07 MED ORDER — LORAZEPAM 0.5 MG PO TABS: 0.5000 mg | ORAL_TABLET | Freq: Four times a day (QID) | ORAL | Status: DC | PRN

## 2015-11-07 MED ORDER — IOPAMIDOL (ISOVUE-370) INJECTION 76%
100.0000 mL | Freq: Once | INTRAVENOUS | Status: AC | PRN
  Administered 2015-11-07: 100 mL via INTRAVENOUS

## 2015-11-07 NOTE — Progress Notes (Signed)
Seen by Judson Roch today, no chemo. IV removed from left antecubital.

## 2015-11-07 NOTE — Progress Notes (Signed)
Hematology and Oncology Follow Up Visit  Carol Buchanan [id number] 11-03-1960 55 y.o. 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 She c/o worsening SOB, chills and fatigue. Her O2 sat on room air was 87%. On 2L  she is now 93%.  Her cough is unchanged. Her PET scan Monday showed an overall positive response to treatment. We will send her downstairs for a CT angio to assess for possible PE.  No fever, n/v, rash, dizziness, chest pain, palpitations, abdominal pain or changes in bowel or bladder habits. No swelling, tenderness, numbness or tingling in her extremities. No c/o joint aches or "bone" pain.She still has a dry cough that is no worse.  She has maintained a good appetite and is staying well hydrated. Her weight is unchanged.     Medications:    Medication List       This list is accurate as of: 11/07/15  2:19 PM.  Always use your most recent med list.               Calcium Carbonate-Vit D-Min 1200-1000 MG-UNIT Chew  Chew 1 tablet by mouth daily.     CENTRUM SILVER PO  Take by mouth.     doxycycline 100 MG tablet  Commonly known as:  VIBRA-TABS  Take 1 tablet (100 mg total) by mouth 2 (two) times daily.     DULoxetine 60 MG capsule  Commonly known as:  CYMBALTA  Take 60 mg by mouth daily. Reported on 08/06/2015     fluticasone 50 MCG/ACT nasal spray  Commonly known as:  FLONASE  Place 2 sprays into both nostrils daily.     ibuprofen 800 MG tablet  Commonly known as:  ADVIL,MOTRIN  Take 800 mg by mouth every 8 (eight) hours as needed for moderate pain.     LORazepam 0.5 MG tablet  Commonly known as:  ATIVAN  Take 1 tablet (0.5 mg total) by mouth every 6  (six) hours as needed (Nausea or vomiting).     meloxicam 15 MG tablet  Commonly known as:  MOBIC  Take 15 mg by mouth daily.     montelukast 10 MG tablet  Commonly known as:  SINGULAIR  Take 1 tablet (10 mg total) by mouth at bedtime.     omeprazole 20 MG capsule  Commonly known as:  PRILOSEC  Take 20 mg by mouth daily.     ondansetron 8 MG tablet  Commonly known as:  ZOFRAN  Take 1 tablet (8 mg total) by mouth 2 (two) times daily as needed (Nausea or vomiting).     predniSONE 20 MG tablet  Commonly known as:  DELTASONE  Take 1 tablet (20 mg total) by mouth daily with breakfast.     PREMPRO 0.625-2.5 MG tablet  Generic drug:  estrogen (conjugated)-medroxyprogesterone     PROAIR HFA 108 (90 Base) MCG/ACT inhaler  Generic drug:  albuterol  inhale 1 puff every 4 hours if needed for shortness of breath     prochlorperazine 10 MG tablet  Commonly known as:  COMPAZINE  Take 1 tablet (10 mg total) by mouth every 6 (six) hours as needed (Nausea or vomiting).     simvastatin 20 MG tablet  Commonly known as:  ZOCOR  Take 20 mg by mouth daily.     Tiotropium Bromide-Olodaterol 2.5-2.5 MCG/ACT Aers  Commonly known as:  STIOLTO RESPIMAT  Inhale 2 puffs into the lungs daily.     venlafaxine XR 150 MG 24 hr capsule  Commonly known as:  EFFEXOR-XR  Take 150 mg by mouth daily. Reported on 08/06/2015     verapamil 180 MG (CO) 24 hr tablet  Commonly known as:  COVERA HS  Take 180 mg by mouth daily.     vitamin C 1000 MG tablet  Take 1,000 mg by mouth daily.     vitamin E 400 UNIT capsule  Take 400 Units by mouth daily.        Allergies: No Known Allergies  Past Medical History, Surgical history, Social history, and Family History were reviewed and updated.  Review of Systems: All other 10 point review of systems is negative.   Physical Exam:  height is _0  (1.626 m) and weight is 172 lb (78.019 kg). Her oral temperature is 97.8 F (36.6 C). Her blood pressure is  130/69 and her pulse is 110. Her respiration is 20 and oxygen saturation is 95%.   Wt Readings from Last 3 Encounters:  11/07/15 172 lb (78.019 kg)  10/25/15 171 lb (77.565 kg)  10/18/15 174 lb (78.926 kg)    Ocular: Sclerae unicteric, pupils equal, round and reactive to light Ear-nose-throat: Oropharynx clear, dentition fair Lymphatic: No cervical supraclavicular or axillary adenopathy Lungs no rales or rhonchi, good excursion bilaterally Heart regular rate and rhythm, no murmur appreciated Abd soft, nontender, positive bowel sounds, no liver or spleen tip palpated on exam, no fluid wave MSK no focal spinal tenderness, no joint edema Neuro: non-focal, well-oriented, appropriate affect Breasts: Deferred  Lab Results  Component Value Date   WBC 6.0 11/07/2015   HGB 11.8 11/07/2015   HCT 33.5* 11/07/2015   MCV 96 11/07/2015   PLT 300 11/07/2015   No results found for: FERRITIN, IRON, TIBC, UIBC, IRONPCTSAT Lab Results  Component Value Date   RBC 3.48* 11/07/2015   No results found for: KPAFRELGTCHN, LAMBDASER, KAPLAMBRATIO No results found for: IGGSERUM, IGA, IGMSERUM No results found for: Odetta Pink, SPEI   Chemistry      Component Value Date/Time   NA 132 11/07/2015 1329   NA 136 10/25/2015 1402   NA 139 06/21/2015 1836   K 3.3 11/07/2015 1329   K 4.8 10/25/2015 1402   K 3.3* 06/21/2015 1836   CL 101 11/07/2015 1329   CL 105 06/21/2015 1836   CO2 22 11/07/2015 1329   CO2 22 10/25/2015 1402   CO2 25 06/21/2015 1836   BUN 15 11/07/2015 1329   BUN 17.9 10/25/2015 1402   BUN 10 06/21/2015 1836   CREATININE 0.9 11/07/2015 1329   CREATININE 0.8 10/25/2015 1402   CREATININE 0.60 06/21/2015 1836      Component Value Date/Time   CALCIUM 9.5 11/07/2015 1329   CALCIUM 9.4 10/25/2015 1402   CALCIUM 9.4 06/21/2015 1836   ALKPHOS 78 11/07/2015 1329   ALKPHOS 41 10/25/2015 1402   AST 25 11/07/2015 1329   AST 19  10/25/2015 1402   ALT 30 11/07/2015 1329   ALT 47 10/25/2015 1402   BILITOT 1.00 11/07/2015 1329   BILITOT 0.88 10/25/2015 1402     Impression and Plan: Carol Buchanan is a 55 yo white female with metastatic poorly differentiated carcinoma of the left lung and  wild-type for the typical genetic mutations. She is here today for treatment but is having worsening SOB, chills and fatigue. On arrival her O2 sat was 87%. On supplemental O2 she came up to 93%.  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 We will get her started on a Decadron taper and hold her treatment today.  We will plan to see her back next Wednesday for follow-up and possibly resume treatment at that time.  She will contact us with any questions or concerns. We can certainly see her sooner if need be.   Eliezer Bottom, NP 4/13/20172:19 PM

## 2015-11-13 ENCOUNTER — Other Ambulatory Visit: Payer: Self-pay | Admitting: Family

## 2015-11-13 ENCOUNTER — Ambulatory Visit (HOSPITAL_BASED_OUTPATIENT_CLINIC_OR_DEPARTMENT_OTHER): Payer: BLUE CROSS/BLUE SHIELD

## 2015-11-13 ENCOUNTER — Other Ambulatory Visit: Payer: Self-pay | Admitting: *Deleted

## 2015-11-13 ENCOUNTER — Encounter: Payer: Self-pay | Admitting: Family

## 2015-11-13 ENCOUNTER — Other Ambulatory Visit (HOSPITAL_BASED_OUTPATIENT_CLINIC_OR_DEPARTMENT_OTHER): Payer: BLUE CROSS/BLUE SHIELD

## 2015-11-13 ENCOUNTER — Ambulatory Visit (HOSPITAL_BASED_OUTPATIENT_CLINIC_OR_DEPARTMENT_OTHER): Payer: BLUE CROSS/BLUE SHIELD | Admitting: Family

## 2015-11-13 VITALS — BP 155/85 | HR 82 | Temp 98.0°F | Resp 16 | Ht 64.0 in | Wt 176.0 lb

## 2015-11-13 DIAGNOSIS — C7951 Secondary malignant neoplasm of bone: Secondary | ICD-10-CM | POA: Diagnosis not present

## 2015-11-13 DIAGNOSIS — C3492 Malignant neoplasm of unspecified part of left bronchus or lung: Secondary | ICD-10-CM

## 2015-11-13 DIAGNOSIS — Z79899 Other long term (current) drug therapy: Secondary | ICD-10-CM | POA: Diagnosis not present

## 2015-11-13 DIAGNOSIS — R21 Rash and other nonspecific skin eruption: Secondary | ICD-10-CM

## 2015-11-13 DIAGNOSIS — Z5112 Encounter for antineoplastic immunotherapy: Secondary | ICD-10-CM

## 2015-11-13 DIAGNOSIS — C3412 Malignant neoplasm of upper lobe, left bronchus or lung: Secondary | ICD-10-CM

## 2015-11-13 DIAGNOSIS — C3491 Malignant neoplasm of unspecified part of right bronchus or lung: Secondary | ICD-10-CM

## 2015-11-13 DIAGNOSIS — C3490 Malignant neoplasm of unspecified part of unspecified bronchus or lung: Secondary | ICD-10-CM | POA: Diagnosis not present

## 2015-11-13 DIAGNOSIS — J700 Acute pulmonary manifestations due to radiation: Secondary | ICD-10-CM

## 2015-11-13 LAB — COMPREHENSIVE METABOLIC PANEL
ALT: 27 U/L (ref 0–55)
ANION GAP: 11 meq/L (ref 3–11)
AST: 22 U/L (ref 5–34)
Albumin: 3.1 g/dL — ABNORMAL LOW (ref 3.5–5.0)
Alkaline Phosphatase: 66 U/L (ref 40–150)
BUN: 18.7 mg/dL (ref 7.0–26.0)
CHLORIDE: 103 meq/L (ref 98–109)
CO2: 21 meq/L — AB (ref 22–29)
CREATININE: 0.8 mg/dL (ref 0.6–1.1)
Calcium: 8.9 mg/dL (ref 8.4–10.4)
EGFR: 84 mL/min/{1.73_m2} — ABNORMAL LOW (ref >90)
Glucose: 168 mg/dl — ABNORMAL HIGH (ref 70–140)
Potassium: 4.4 mEq/L (ref 3.5–5.1)
Sodium: 135 mEq/L — ABNORMAL LOW (ref 136–145)
Total Bilirubin: 0.39 mg/dL (ref 0.20–1.20)
Total Protein: 6.4 g/dL (ref 6.4–8.3)

## 2015-11-13 LAB — CBC WITH DIFFERENTIAL (CANCER CENTER ONLY)
BASO#: 0 10*3/uL (ref 0.0–0.2)
BASO%: 0.1 % (ref 0.0–2.0)
EOS%: 0.1 % (ref 0.0–7.0)
Eosinophils Absolute: 0 10*3/uL (ref 0.0–0.5)
HCT: 34.5 % — ABNORMAL LOW (ref 34.8–46.6)
HEMOGLOBIN: 11.9 g/dL (ref 11.6–15.9)
LYMPH#: 0.7 10*3/uL — ABNORMAL LOW (ref 0.9–3.3)
LYMPH%: 4.1 % — ABNORMAL LOW (ref 14.0–48.0)
MCH: 33.2 pg (ref 26.0–34.0)
MCHC: 34.5 g/dL (ref 32.0–36.0)
MCV: 96 fL (ref 81–101)
MONO#: 1.2 10*3/uL — ABNORMAL HIGH (ref 0.1–0.9)
MONO%: 6.6 % (ref 0.0–13.0)
NEUT%: 89.1 % — ABNORMAL HIGH (ref 39.6–80.0)
NEUTROS ABS: 15.7 10*3/uL — AB (ref 1.5–6.5)
Platelets: 452 10*3/uL — ABNORMAL HIGH (ref 145–400)
RBC: 3.58 10*6/uL — AB (ref 3.70–5.32)
RDW: 14 % (ref 11.1–15.7)
WBC: 17.6 10*3/uL — AB (ref 3.9–10.0)

## 2015-11-13 LAB — TECHNOLOGIST REVIEW CHCC SATELLITE

## 2015-11-13 LAB — TSH: TSH: 0.529 m[IU]/L (ref 0.308–3.960)

## 2015-11-13 MED ORDER — DENOSUMAB 120 MG/1.7ML ~~LOC~~ SOLN
120.0000 mg | Freq: Once | SUBCUTANEOUS | Status: AC
  Administered 2015-11-13: 120 mg via SUBCUTANEOUS
  Filled 2015-11-13: qty 1.7

## 2015-11-13 MED ORDER — ONDANSETRON HCL 4 MG PO TABS: 4.0000 mg | ORAL_TABLET | Freq: Once | ORAL | Status: DC

## 2015-11-13 MED ORDER — SODIUM CHLORIDE 0.9 % IV SOLN
200.0000 mg | Freq: Once | INTRAVENOUS | Status: AC
  Administered 2015-11-13: 200 mg via INTRAVENOUS
  Filled 2015-11-13: qty 8

## 2015-11-13 MED ORDER — SODIUM CHLORIDE 0.9 % IV SOLN
Freq: Once | INTRAVENOUS | Status: AC
  Administered 2015-11-13: 14:00:00 via INTRAVENOUS

## 2015-11-13 NOTE — Progress Notes (Signed)
Hematology and Oncology Follow Up Visit  Carol Buchanan [id number] Dec 10, 1960 55 y.o. 11/13/2015   Principle Diagnosis:  Metastatic poorly differentiated carcinoma of the left lung-(-) for EGFR/ALK/ROS PD-L1 (+)  Current Therapy:   Patient status post radiation therapy Xgeva 120 mg subcutaneous monthly Pembrolizumab 24m IV q 3wks - s/p cycle 3    Interim History:  Carol Buchanan here today for follow-up. She is feeling much better today and is still currently on a Decadron taper. Her SOB is back to baseline. She still has the dry cough which is unchanged. Her O2 sat is now 98% on room air.  No fever, n/v, rash, dizziness, chest pain, palpitations, abdominal pain or changes in bowel or bladder habits. She continues to have occasional diarrhea once or twice a week.   No swelling, tenderness, numbness or tingling in her extremities. No c/o joint aches or "bone" pain. She has maintained a good appetite and is staying well hydrated. Her weight is unchanged.     Medications:    Medication List       This list is accurate as of: 11/13/15 12:10 PM.  Always use your most recent med list.               Calcium Carbonate-Vit D-Min 1200-1000 MG-UNIT Chew  Chew 1 tablet by mouth daily.     CENTRUM SILVER PO  Take by mouth.     dexamethasone 4 MG tablet  Commonly known as:  DECADRON  Take 1 tablet (4 mg total) by mouth 2 (two) times daily with a meal.     DULoxetine 60 MG capsule  Commonly known as:  CYMBALTA  Take 60 mg by mouth daily. Reported on 08/06/2015     fluticasone 50 MCG/ACT nasal spray  Commonly known as:  FLONASE  Place 2 sprays into both nostrils daily.     ibuprofen 800 MG tablet  Commonly known as:  ADVIL,MOTRIN  Take 800 mg by mouth every 8 (eight) hours as needed for moderate pain.     LORazepam 0.5 MG tablet  Commonly known as:  ATIVAN  Take 1 tablet (0.5 mg total) by mouth every 6 (six) hours as needed (Nausea or vomiting).     meloxicam 15 MG  tablet  Commonly known as:  MOBIC  Take 15 mg by mouth daily.     montelukast 10 MG tablet  Commonly known as:  SINGULAIR  Take 1 tablet (10 mg total) by mouth at bedtime.     omeprazole 20 MG capsule  Commonly known as:  PRILOSEC  Take 20 mg by mouth daily.     ondansetron 4 MG tablet  Commonly known as:  ZOFRAN  Take 1 tablet (4 mg total) by mouth once.     PREMPRO 0.625-2.5 MG tablet  Generic drug:  estrogen (conjugated)-medroxyprogesterone     PROAIR HFA 108 (90 Base) MCG/ACT inhaler  Generic drug:  albuterol  inhale 1 puff every 4 hours if needed for shortness of breath     prochlorperazine 10 MG tablet  Commonly known as:  COMPAZINE  Take 1 tablet (10 mg total) by mouth every 6 (six) hours as needed (Nausea or vomiting).     simvastatin 20 MG tablet  Commonly known as:  ZOCOR  Take 20 mg by mouth daily.     Tiotropium Bromide-Olodaterol 2.5-2.5 MCG/ACT Aers  Commonly known as:  STIOLTO RESPIMAT  Inhale 2 puffs into the lungs daily.     venlafaxine XR 150 MG 24 hr  capsule  Commonly known as:  EFFEXOR-XR  Take 150 mg by mouth daily. Reported on 08/06/2015     verapamil 180 MG (CO) 24 hr tablet  Commonly known as:  COVERA HS  Take 180 mg by mouth daily.     vitamin C 1000 MG tablet  Take 1,000 mg by mouth daily.     vitamin E 400 UNIT capsule  Take 400 Units by mouth daily.        Allergies: No Known Allergies  Past Medical History, Surgical history, Social history, and Family History were reviewed and updated.  Review of Systems: All other 10 point review of systems is negative.   Physical Exam:  height is 5' 4"  (1.626 m) and weight is 176 lb (79.833 kg). Her oral temperature is 98 F (36.7 C). Her blood pressure is 155/85 and her pulse is 82. Her respiration is 16.   Wt Readings from Last 3 Encounters:  11/13/15 176 lb (79.833 kg)  11/07/15 172 lb (78.019 kg)  10/25/15 171 lb (77.565 kg)    Ocular: Sclerae unicteric, pupils equal, round and  reactive to light Ear-nose-throat: Oropharynx clear, dentition fair Lymphatic: No cervical supraclavicular or axillary adenopathy Lungs no rales or rhonchi, good excursion bilaterally Heart regular rate and rhythm, no murmur appreciated Abd soft, nontender, positive bowel sounds, no liver or spleen tip palpated on exam, no fluid wave MSK no focal spinal tenderness, no joint edema Neuro: non-focal, well-oriented, appropriate affect Breasts: Deferred  Lab Results  Component Value Date   WBC 6.0 11/07/2015   HGB 11.8 11/07/2015   HCT 33.5* 11/07/2015   MCV 96 11/07/2015   PLT 300 11/07/2015   No results found for: FERRITIN, IRON, TIBC, UIBC, IRONPCTSAT Lab Results  Component Value Date   RBC 3.48* 11/07/2015   No results found for: KPAFRELGTCHN, LAMBDASER, KAPLAMBRATIO No results found for: IGGSERUM, IGA, IGMSERUM No results found for: Carol Buchanan, SPEI   Chemistry      Component Value Date/Time   NA 132 11/07/2015 1329   NA 136 10/25/2015 1402   NA 139 06/21/2015 1836   K 3.3 11/07/2015 1329   K 4.8 10/25/2015 1402   K 3.3* 06/21/2015 1836   CL 101 11/07/2015 1329   CL 105 06/21/2015 1836   CO2 22 11/07/2015 1329   CO2 22 10/25/2015 1402   CO2 25 06/21/2015 1836   BUN 15 11/07/2015 1329   BUN 17.9 10/25/2015 1402   BUN 10 06/21/2015 1836   CREATININE 0.9 11/07/2015 1329   CREATININE 0.8 10/25/2015 1402   CREATININE 0.60 06/21/2015 1836      Component Value Date/Time   CALCIUM 9.5 11/07/2015 1329   CALCIUM 9.4 10/25/2015 1402   CALCIUM 9.4 06/21/2015 1836   ALKPHOS 78 11/07/2015 1329   ALKPHOS 41 10/25/2015 1402   AST 25 11/07/2015 1329   AST 19 10/25/2015 1402   ALT 30 11/07/2015 1329   ALT 47 10/25/2015 1402   BILITOT 1.00 11/07/2015 1329   BILITOT 0.88 10/25/2015 1402     Impression and Plan: Carol Buchanan is a 55 yo white female with metastatic poorly differentiated carcinoma of the left lung and  wild-type for the typical genetic mutations. Her treatment was held last week due to pneumonitis. She is currently on a Decadron taper and feeling much better. Her O2 on room air is 98% and her respiratory symptoms have resolved. We will proceed with cycle 4 of Keytruda today as planned per  Dr. Marin Olp.  We will plan to see her back in 3 weeks.  She will need a new treatment and appointment schedule today. She will contact us with any questions or concerns. We can certainly see her sooner if need be.   Eliezer Bottom, NP 4/19/201712:10 PM

## 2015-11-13 NOTE — Patient Instructions (Signed)
[facility] Discharge Instructions for Patients Receiving Chemotherapy  Today you received the following chemotherapy agents Keytruda  To help prevent nausea and vomiting after your treatment, we encourage you to take your nausea medication    If you develop nausea and vomiting that is not controlled by your nausea medication, call the clinic.   BELOW ARE SYMPTOMS THAT SHOULD BE REPORTED IMMEDIATELY:  *FEVER GREATER THAN 100.5 F  *CHILLS WITH OR WITHOUT FEVER  NAUSEA AND VOMITING THAT IS NOT CONTROLLED WITH YOUR NAUSEA MEDICATION  *UNUSUAL SHORTNESS OF BREATH  *UNUSUAL BRUISING OR BLEEDING  TENDERNESS IN MOUTH AND THROAT WITH OR WITHOUT PRESENCE OF ULCERS  *URINARY PROBLEMS  *BOWEL PROBLEMS  UNUSUAL RASH Items with * indicate a potential emergency and should be followed up as soon as possible.  Feel free to call the clinic you have any questions or concerns. The clinic phone number is [phone].  Please show the CHEMO ALERT CARD at check-in to the Emergency Department and triage nurse.   

## 2015-11-22 ENCOUNTER — Ambulatory Visit: Payer: BLUE CROSS/BLUE SHIELD

## 2015-11-22 ENCOUNTER — Ambulatory Visit: Payer: BLUE CROSS/BLUE SHIELD | Admitting: Hematology & Oncology

## 2015-11-22 ENCOUNTER — Other Ambulatory Visit: Payer: BLUE CROSS/BLUE SHIELD

## 2015-11-25 ENCOUNTER — Emergency Department (HOSPITAL_COMMUNITY): Payer: Medicare Other

## 2015-11-25 ENCOUNTER — Telehealth: Payer: Self-pay | Admitting: *Deleted

## 2015-11-25 ENCOUNTER — Other Ambulatory Visit: Payer: Self-pay | Admitting: *Deleted

## 2015-11-25 ENCOUNTER — Encounter (HOSPITAL_COMMUNITY): Payer: Self-pay | Admitting: Emergency Medicine

## 2015-11-25 ENCOUNTER — Inpatient Hospital Stay (HOSPITAL_COMMUNITY)
Admission: EM | Admit: 2015-11-25 | Discharge: 2015-12-02 | DRG: 193 | Disposition: A | Discharge status: Home Health | Payer: Medicare Other | Attending: Internal Medicine | Admitting: Internal Medicine

## 2015-11-25 DIAGNOSIS — Z8249 Family history of ischemic heart disease and other diseases of the circulatory system: Secondary | ICD-10-CM | POA: Diagnosis not present

## 2015-11-25 DIAGNOSIS — C3410 Malignant neoplasm of upper lobe, unspecified bronchus or lung: Secondary | ICD-10-CM

## 2015-11-25 DIAGNOSIS — C7951 Secondary malignant neoplasm of bone: Secondary | ICD-10-CM | POA: Diagnosis present

## 2015-11-25 DIAGNOSIS — F064 Anxiety disorder due to known physiological condition: Secondary | ICD-10-CM | POA: Diagnosis present

## 2015-11-25 DIAGNOSIS — K5900 Constipation, unspecified: Secondary | ICD-10-CM

## 2015-11-25 DIAGNOSIS — J441 Chronic obstructive pulmonary disease with (acute) exacerbation: Secondary | ICD-10-CM

## 2015-11-25 DIAGNOSIS — I10 Essential (primary) hypertension: Secondary | ICD-10-CM

## 2015-11-25 DIAGNOSIS — J189 Pneumonia, unspecified organism: Principal | ICD-10-CM

## 2015-11-25 DIAGNOSIS — Y95 Nosocomial condition: Secondary | ICD-10-CM | POA: Diagnosis present

## 2015-11-25 DIAGNOSIS — E785 Hyperlipidemia, unspecified: Secondary | ICD-10-CM | POA: Diagnosis present

## 2015-11-25 DIAGNOSIS — C3492 Malignant neoplasm of unspecified part of left bronchus or lung: Secondary | ICD-10-CM

## 2015-11-25 DIAGNOSIS — R918 Other nonspecific abnormal finding of lung field: Secondary | ICD-10-CM | POA: Diagnosis not present

## 2015-11-25 DIAGNOSIS — Z923 Personal history of irradiation: Secondary | ICD-10-CM

## 2015-11-25 DIAGNOSIS — M179 Osteoarthritis of knee, unspecified: Secondary | ICD-10-CM | POA: Diagnosis present

## 2015-11-25 DIAGNOSIS — F329 Major depressive disorder, single episode, unspecified: Secondary | ICD-10-CM | POA: Diagnosis present

## 2015-11-25 DIAGNOSIS — J9601 Acute respiratory failure with hypoxia: Secondary | ICD-10-CM | POA: Diagnosis present

## 2015-11-25 DIAGNOSIS — G629 Polyneuropathy, unspecified: Secondary | ICD-10-CM

## 2015-11-25 DIAGNOSIS — J80 Acute respiratory distress syndrome: Secondary | ICD-10-CM | POA: Diagnosis not present

## 2015-11-25 DIAGNOSIS — T380X5A Adverse effect of glucocorticoids and synthetic analogues, initial encounter: Secondary | ICD-10-CM | POA: Diagnosis present

## 2015-11-25 DIAGNOSIS — R251 Tremor, unspecified: Secondary | ICD-10-CM | POA: Diagnosis present

## 2015-11-25 DIAGNOSIS — J440 Chronic obstructive pulmonary disease with acute lower respiratory infection: Secondary | ICD-10-CM | POA: Diagnosis present

## 2015-11-25 DIAGNOSIS — E1165 Type 2 diabetes mellitus with hyperglycemia: Secondary | ICD-10-CM | POA: Diagnosis not present

## 2015-11-25 DIAGNOSIS — E872 Acidosis: Secondary | ICD-10-CM | POA: Diagnosis present

## 2015-11-25 DIAGNOSIS — Z79899 Other long term (current) drug therapy: Secondary | ICD-10-CM

## 2015-11-25 DIAGNOSIS — J8489 Other specified interstitial pulmonary diseases: Secondary | ICD-10-CM | POA: Diagnosis not present

## 2015-11-25 DIAGNOSIS — J9600 Acute respiratory failure, unspecified whether with hypoxia or hypercapnia: Secondary | ICD-10-CM | POA: Diagnosis not present

## 2015-11-25 DIAGNOSIS — R739 Hyperglycemia, unspecified: Secondary | ICD-10-CM

## 2015-11-25 DIAGNOSIS — Z7951 Long term (current) use of inhaled steroids: Secondary | ICD-10-CM

## 2015-11-25 DIAGNOSIS — J449 Chronic obstructive pulmonary disease, unspecified: Secondary | ICD-10-CM | POA: Diagnosis not present

## 2015-11-25 DIAGNOSIS — IMO0002 Reserved for concepts with insufficient information to code with codable children: Secondary | ICD-10-CM | POA: Diagnosis present

## 2015-11-25 DIAGNOSIS — Z87891 Personal history of nicotine dependence: Secondary | ICD-10-CM

## 2015-11-25 DIAGNOSIS — C3490 Malignant neoplasm of unspecified part of unspecified bronchus or lung: Secondary | ICD-10-CM | POA: Diagnosis not present

## 2015-11-25 DIAGNOSIS — R0602 Shortness of breath: Secondary | ICD-10-CM | POA: Diagnosis present

## 2015-11-25 DIAGNOSIS — J984 Other disorders of lung: Secondary | ICD-10-CM

## 2015-11-25 DIAGNOSIS — R0603 Acute respiratory distress: Secondary | ICD-10-CM

## 2015-11-25 DIAGNOSIS — Z66 Do not resuscitate: Secondary | ICD-10-CM | POA: Diagnosis present

## 2015-11-25 DIAGNOSIS — C3412 Malignant neoplasm of upper lobe, left bronchus or lung: Secondary | ICD-10-CM | POA: Diagnosis not present

## 2015-11-25 DIAGNOSIS — J84113 Idiopathic non-specific interstitial pneumonitis: Secondary | ICD-10-CM | POA: Diagnosis not present

## 2015-11-25 HISTORY — DX: Type 2 diabetes mellitus with hyperglycemia: E11.65

## 2015-11-25 LAB — COMPREHENSIVE METABOLIC PANEL
ALK PHOS: 83 U/L (ref 38–126)
ALT: 31 U/L (ref 14–54)
ANION GAP: 15 (ref 5–15)
AST: 40 U/L (ref 15–41)
Albumin: 3.5 g/dL (ref 3.5–5.0)
BUN: 16 mg/dL (ref 6–20)
CHLORIDE: 102 mmol/L (ref 101–111)
CO2: 18 mmol/L — AB (ref 22–32)
Calcium: 9.2 mg/dL (ref 8.9–10.3)
Creatinine, Ser: 0.86 mg/dL (ref 0.44–1.00)
GFR calc Af Amer: >60 mL/min (ref >60)
GFR calc non Af Amer: >60 mL/min (ref >60)
GLUCOSE: 286 mg/dL — AB (ref 65–99)
POTASSIUM: 4.1 mmol/L (ref 3.5–5.1)
SODIUM: 135 mmol/L (ref 135–145)
Total Bilirubin: 1.1 mg/dL (ref 0.3–1.2)
Total Protein: 7.3 g/dL (ref 6.5–8.1)

## 2015-11-25 LAB — I-STAT CHEM 8, ED
BUN: 23 mg/dL — AB (ref 6–20)
CHLORIDE: 101 mmol/L (ref 101–111)
CREATININE: 0.6 mg/dL (ref 0.44–1.00)
Calcium, Ion: 1.06 mmol/L — ABNORMAL LOW (ref 1.12–1.23)
Glucose, Bld: 274 mg/dL — ABNORMAL HIGH (ref 65–99)
HEMATOCRIT: 40 % (ref 36.0–46.0)
Hemoglobin: 13.6 g/dL (ref 12.0–15.0)
POTASSIUM: 6.2 mmol/L — AB (ref 3.5–5.1)
Sodium: 131 mmol/L — ABNORMAL LOW (ref 135–145)
TCO2: 22 mmol/L (ref 0–100)

## 2015-11-25 LAB — CBC WITH DIFFERENTIAL/PLATELET
BASOS PCT: 0 %
Basophils Absolute: 0 10*3/uL (ref 0.0–0.1)
Eosinophils Absolute: 0 10*3/uL (ref 0.0–0.7)
Eosinophils Relative: 0 %
HEMATOCRIT: 37.1 % (ref 36.0–46.0)
HEMOGLOBIN: 12.4 g/dL (ref 12.0–15.0)
Lymphocytes Relative: 4 %
Lymphs Abs: 0.5 10*3/uL — ABNORMAL LOW (ref 0.7–4.0)
MCH: 32.1 pg (ref 26.0–34.0)
MCHC: 33.4 g/dL (ref 30.0–36.0)
MCV: 96.1 fL (ref 78.0–100.0)
MONOS PCT: 1 %
Monocytes Absolute: 0.1 10*3/uL (ref 0.1–1.0)
NEUTROS ABS: 11.4 10*3/uL — AB (ref 1.7–7.7)
NEUTROS PCT: 95 %
Platelets: 201 10*3/uL (ref 150–400)
RBC: 3.86 MIL/uL — AB (ref 3.87–5.11)
RDW: 15.4 % (ref 11.5–15.5)
WBC: 12 10*3/uL — AB (ref 4.0–10.5)

## 2015-11-25 LAB — LACTIC ACID, PLASMA: LACTIC ACID, VENOUS: 4.3 mmol/L — AB (ref 0.5–2.0)

## 2015-11-25 LAB — I-STAT CG4 LACTIC ACID, ED: LACTIC ACID, VENOUS: 4.15 mmol/L — AB (ref 0.5–2.0)

## 2015-11-25 LAB — BLOOD GAS, ARTERIAL
ACID-BASE DEFICIT: 6.8 mmol/L — AB (ref 0.0–2.0)
Bicarbonate: 16.5 mEq/L — ABNORMAL LOW (ref 20.0–24.0)
O2 CONTENT: 10 L/min
O2 SAT: 90.8 %
PATIENT TEMPERATURE: 97.6
PCO2 ART: 27 mmHg — AB (ref 35.0–45.0)
TCO2: 15 mmol/L (ref 0–100)
pH, Arterial: 7.4 (ref 7.350–7.450)
pO2, Arterial: 63.3 mmHg — ABNORMAL LOW (ref 80.0–100.0)

## 2015-11-25 LAB — I-STAT TROPONIN, ED: Troponin i, poc: 0 ng/mL (ref 0.00–0.08)

## 2015-11-25 MED ORDER — VANCOMYCIN HCL IN DEXTROSE 1-5 GM/200ML-% IV SOLN
1000.0000 mg | Freq: Once | INTRAVENOUS | Status: AC
  Administered 2015-11-25: 1000 mg via INTRAVENOUS
  Filled 2015-11-25: qty 200

## 2015-11-25 MED ORDER — IPRATROPIUM-ALBUTEROL 0.5-2.5 (3) MG/3ML IN SOLN
3.0000 mL | Freq: Once | RESPIRATORY_TRACT | Status: AC
  Administered 2015-11-25: 3 mL via RESPIRATORY_TRACT
  Filled 2015-11-25: qty 3

## 2015-11-25 MED ORDER — POLYETHYLENE GLYCOL 3350 17 GM/SCOOP PO POWD: 17.0000 g | Freq: Two times a day (BID) | ORAL | Status: DC

## 2015-11-25 MED ORDER — IOPAMIDOL (ISOVUE-370) INJECTION 76%
100.0000 mL | Freq: Once | INTRAVENOUS | Status: AC | PRN
  Administered 2015-11-25: 100 mL via INTRAVENOUS

## 2015-11-25 MED ORDER — CEFDINIR 300 MG PO CAPS: 600.0000 mg | ORAL_CAPSULE | Freq: Every day | ORAL | Status: DC

## 2015-11-25 MED ORDER — PIPERACILLIN-TAZOBACTAM 3.375 G IVPB 30 MIN
3.3750 g | Freq: Once | INTRAVENOUS | Status: AC
  Administered 2015-11-25: 3.375 g via INTRAVENOUS
  Filled 2015-11-25: qty 50

## 2015-11-25 NOTE — ED Notes (Signed)
Yao at bedside. With MD at bedside pt reports fever 102 last night.

## 2015-11-25 NOTE — Telephone Encounter (Signed)
Patient c/o upper respiratory congestion. Her temp is currently 99.0 however she had a Tmax of 102 last pm. She also c/o discomfort related to constipation.  Spoke to Dr Marin Olp and he wants patient to take miralax BID for the constipation. He also will prescribe Omnicef for the URI.   Patient aware of new prescriptions. Instructed patient to drink plenty of fluids, rest and to call office with worsening symptoms. Patient understands instruction.

## 2015-11-25 NOTE — H&P (Signed)
Carol Buchanan IDP:[id number] DOB: Aug 29, 1960 DOA: 11/25/2015   Referring MD  Darl Householder PCP: Andria Frames, MD   Outpatient Specialists: Pulmonology [place], Oncology [facility]  Patient coming from: home  Chief Complaint: Increased work of breathing severe shortness of breath  HPI: Carol Buchanan is a 55 y.o. 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 Upper respiratory congestion and fever up to 102. Reports onset of symptoms has been gradual. Was associated of increased work of breathing but no significant coughing.  This has been associated some constipation now has been relieved with Maalox.,. Patient called her oncologist who prescribed Omnicef but patient shortness of breath has progressed she went to her neighbor's house and checked her oxygen saturation then was showing in 70s and she called 911. In route the patient was given 125 mg of Solu-Medrol and albuterol and Atrovent  Regarding pertinent Chronic problems: Patient has known history of metastatic poorly differentiated carcinoma of left lung for by oncology status post radiation therapy currently on Xgeva monthly and Pembrolizumab last treatment was 19th of April every 3 weeks complicated by pneumonitis currently on Decadron taper   IN ER: Bicarbonate 18 glucose 286  troponin 0 cell count 12 predominately neutrophils hemoglobin 12.4 Lactic acid 4.1 ABG 7.40 PCO2 27 PO2 63 CT chest showing no evidence of PE, known nodular mass within medial aspect of the left upper lobe, worsening extensive interstitial opacities throughout the lungs sparing of the left upper lobe Patient required 10 L of oxygen  Hospitalist was called for admission for acute respiratory hypoxic failure  Review of Systems:    Pertinent positives include: Fevers, chills, fatigue,shortness of breath at rest.  dyspnea on exertion,   Constitutional:  No weight loss, night sweats,  weight loss  HEENT:  No headaches, Difficulty swallowing,Tooth/dental problems,Sore throat,  No sneezing, itching, ear ache, nasal congestion, post nasal drip,  Cardio-vascular:  No chest pain, Orthopnea, PND, anasarca, dizziness, palpitations.no Bilateral lower extremity swelling  GI:  No heartburn, indigestion, abdominal pain, nausea, vomiting, diarrhea, change in bowel habits, loss of appetite, melena, blood in stool, hematemesis Resp:   No excess mucus, no productive cough, No non-productive cough, No coughing up of blood.No change in color of mucus.No wheezing. Skin:  no rash or lesions. No jaundice GU:  no dysuria, change in color of urine, no urgency or frequency. No straining to urinate.  No flank pain.  Musculoskeletal:  No joint pain or no joint swelling. No decreased range of motion. No back pain.  Psych:  No change in mood or affect. No depression or anxiety. No memory loss.  Neuro: no localizing neurological complaints, no tingling, no weakness, no double vision, no gait abnormality, no slurred speech, no confusion  As per HPI otherwise 10 point review of systems negative.   Past Medical History: Past Medical History  Diagnosis Date  . Hypertension   . Hyperlipidemia   . Sinus trouble   . Neuropathy ([place])   . COPD (chronic obstructive pulmonary disease) ([place])   . Primary lung cancer with metastasis from lung to other site [facility]) 07/26/2015  . Radiation 08/06/15-08/23/15    left lung mass, T4 spinal metastasis and left rib metastasis 35 gray   Past Surgical History  Procedure Laterality Date  . Tubal ligation  1994  . Back surgery  2015  . Video bronchoscopy Bilateral 07/01/2015  Procedure: VIDEO BRONCHOSCOPY WITHOUT FLUORO;  Surgeon: Collene Gobble, MD;  Location: [place];  Service: Cardiopulmonary;  Laterality: Bilateral;     Social History:  Ambulatory   independently  Lives at  home alone,        reports that she quit smoking about 4 years ago. Her smoking use included Cigarettes. She has a 52.5 pack-year smoking history. She has never used smokeless tobacco. She reports that she drinks about 1.2 oz of alcohol per week. She reports that she does not use illicit drugs.  Allergies:  No Known Allergies     Family History:    Family History  Problem Relation Age of Onset  . Heart disease Father   . Heart attack Father   . Arthritis Mother     Medications: Prior to Admission medications   Medication Sig Start Date End Date Taking? Authorizing Provider  Ascorbic Acid (VITAMIN C) 1000 MG tablet Take 1,000 mg by mouth daily.   Yes Historical Provider, MD  Calcium Carbonate-Vit D-Min 1200-1000 MG-UNIT CHEW Chew 1 tablet by mouth daily.   Yes Historical Provider, MD  cefdinir (OMNICEF) 300 MG capsule Take 2 capsules (600 mg total) by mouth daily. For 7 days 11/25/15  Yes Volanda Napoleon, MD  dexamethasone (DECADRON) 4 MG tablet Take 1 tablet (4 mg total) by mouth 2 (two) times daily with a meal. Patient taking differently: Take 4 mg by mouth daily.  11/07/15  Yes Eliezer Bottom, NP  DULoxetine (CYMBALTA) 60 MG capsule Take 60 mg by mouth daily. Reported on 08/06/2015 08/02/15  Yes Historical Provider, MD  fluticasone (FLONASE) 50 MCG/ACT nasal spray Place 2 sprays into both nostrils daily. 08/29/15  Yes Collene Gobble, MD  ibuprofen (ADVIL,MOTRIN) 800 MG tablet Take 800 mg by mouth every 8 (eight) hours as needed for moderate pain.  01/13/14  Yes Historical Provider, MD  LORazepam (ATIVAN) 0.5 MG tablet Take 1 tablet (0.5 mg total) by mouth every 6 (six) hours as needed (Nausea or vomiting). Patient taking differently: Take 0.5 mg by mouth at bedtime.  11/07/15  Yes Eliezer Bottom, NP  meloxicam (MOBIC) 15 MG tablet Take 15 mg by mouth daily.   Yes Historical Provider, MD  montelukast (SINGULAIR) 10 MG tablet Take 1 tablet (10 mg total) by mouth at bedtime. 10/09/15   Yes Eliezer Bottom, NP  Multiple Vitamins-Minerals (CENTRUM SILVER PO) Take 1 tablet by mouth daily.    Yes Historical Provider, MD  omeprazole (PRILOSEC) 20 MG capsule Take 20 mg by mouth daily.   Yes Historical Provider, MD  PREMPRO 0.625-2.5 MG tablet Take 1 tablet by mouth daily.  08/06/15  Yes Historical Provider, MD  PROAIR HFA 108 (90 BASE) MCG/ACT inhaler inhale 1 puff every 4 hours if needed for shortness of breath 05/13/15  Yes Collene Gobble, MD  prochlorperazine (COMPAZINE) 10 MG tablet Take 1 tablet (10 mg total) by mouth every 6 (six) hours as needed (Nausea or vomiting). 08/30/15  Yes Volanda Napoleon, MD  simvastatin (ZOCOR) 20 MG tablet Take 20 mg by mouth daily.    Yes Historical Provider, MD  Tiotropium Bromide-Olodaterol (STIOLTO RESPIMAT) 2.5-2.5 MCG/ACT AERS Inhale 2 puffs into the lungs daily. 08/29/15  Yes Collene Gobble, MD  venlafaxine XR (EFFEXOR-XR) 150 MG 24 hr capsule Take 150 mg by mouth daily. Reported on 08/06/2015   Yes Historical Provider, MD  verapamil (COVERA HS) 180 MG (CO) 24 hr tablet Take 180 mg by mouth daily.  Yes Historical Provider, MD  vitamin E 400 UNIT capsule Take 400 Units by mouth daily.   Yes Historical Provider, MD  ondansetron (ZOFRAN) 4 MG tablet Take 1 tablet (4 mg total) by mouth once. Patient not taking: Reported on 11/13/2015 11/13/15   Volanda Napoleon, MD  polyethylene glycol powder [facility]) powder Take 17 g by mouth 2 (two) times daily. 11/25/15   Volanda Napoleon, MD    Physical Exam: Patient Vitals for the past 24 hrs:  BP Temp Temp src Pulse Resp SpO2  11/25/15 2054 136/69 mmHg - - 97 23 90 %  11/25/15 2013 136/69 mmHg - - 95 23 98 %  11/25/15 1930 118/59 mmHg - - 95 26 95 %  11/25/15 1924 - - - - - 93 %  11/25/15 1923 136/73 mmHg - - - - (!) 86 %  11/25/15 1921 - 97.6 F (36.4 C) Oral 99 26 (!) 74 %  11/25/15 1914 - - - - - 94 %    1. General:  in No Acute distress, increased work of breathing 2. Psychological: Alert and    Oriented 3. Head/ENT:   Dry Mucous Membranes                          Head Non traumatic, neck supple                          Normal   Dentition 4. SKIN: normal  Skin turgor,  Skin clean Dry and intact no rash 5. Heart: Regular rate and rhythm no  Murmur, Rub or gallop 6. Lungs:  no wheezes some crackles  overall good air movement 7. Abdomen: Soft, non-tender, Non distended 8. Lower extremities: no clubbing, cyanosis, or edema 9. Neurologically Grossly intact, moving all 4 extremities equally 10. MSK: Normal range of motion   body mass index is unknown because there is no weight on file.  Labs on Admission:   Labs on Admission: I have personally reviewed following labs and imaging studies  CBC:  Recent Labs Lab 11/25/15 1936 11/25/15 2011  WBC 12.0*  --   NEUTROABS 11.4*  --   HGB 12.4 13.6  HCT 37.1 40.0  MCV 96.1  --   PLT 201  --    Basic Metabolic Panel:  Recent Labs Lab 11/25/15 2011 11/25/15 2040  NA 131* 135  K 6.2* 4.1  CL 101 102  CO2  --  18*  GLUCOSE 274* 286*  BUN 23* 16  CREATININE 0.60 0.86  CALCIUM  --  9.2   GFR: Estimated Creatinine Clearance: 75.5 mL/min (by C-G formula based on Cr of 0.86). Liver Function Tests:  Recent Labs Lab 11/25/15 2040  AST 40  ALT 31  ALKPHOS 83  BILITOT 1.1  PROT 7.3  ALBUMIN 3.5   No results for input(s): LIPASE, AMYLASE in the last 168 hours. No results for input(s): AMMONIA in the last 168 hours. Coagulation Profile: No results for input(s): INR, PROTIME in the last 168 hours. Cardiac Enzymes: No results for input(s): CKTOTAL, CKMB, CKMBINDEX, TROPONINI in the last 168 hours. BNP (last 3 results) No results for input(s): PROBNP in the last 8760 hours. HbA1C: No results for input(s): HGBA1C in the last 72 hours. CBG: No results for input(s): GLUCAP in the last 168 hours. Lipid Profile: No results for input(s): CHOL, HDL, LDLCALC, TRIG, CHOLHDL, LDLDIRECT in the last 72 hours. Thyroid Function  Tests:  No results for input(s): TSH, T4TOTAL, FREET4, T3FREE, THYROIDAB in the last 72 hours. Anemia Panel: No results for input(s): VITAMINB12, FOLATE, FERRITIN, TIBC, IRON, RETICCTPCT in the last 72 hours. Urine analysis: No results found for: COLORURINE, APPEARANCEUR, LABSPEC, PHURINE, GLUCOSEU, HGBUR, BILIRUBINUR, KETONESUR, PROTEINUR, UROBILINOGEN, NITRITE, LEUKOCYTESUR Sepsis Labs: '@LABRCNTIP'$ (procalcitonin:4,lacticidven:4) )No results found for this or any previous visit (from the past 240 hour(s)).   UA pending    No results found for: HGBA1C  Estimated Creatinine Clearance: 75.5 mL/min (by C-G formula based on Cr of 0.86).  BNP (last 3 results) No results for input(s): PROBNP in the last 8760 hours.   ECG REPORT  Independently reviewed Rate:100  Rhythm: Sinus tachycardia with left atrial enlargement ST&T Change: No acute ischemic changes   QTC 454  There were no vitals filed for this visit.   Cultures: No results found for: SDES, SPECREQUEST, CULT, REPTSTATUS   Radiological Exams on Admission: Ct Angio Chest Pe W/cm &/or Wo Cm  11/25/2015  CLINICAL DATA:  Stage IV lung cancer, now with shortness of breath. Evaluate pulmonary embolism. EXAM: CT ANGIOGRAPHY CHEST WITH CONTRAST TECHNIQUE: Multidetector CT imaging of the chest was performed using the standard protocol during bolus administration of intravenous contrast. Multiplanar CT image reconstructions and MIPs were obtained to evaluate the vascular anatomy. CONTRAST:  100 cc Isovue 370 COMPARISON:  Chest CT - 11/07/2015; 06/21/2015 PET-CT - 11/04/2015 FINDINGS: Vascular Findings: There is adequate opacification of the pulmonary arterial system with the main pulmonary artery measuring 236 Hounsfield units. No discrete filling defects are seen within the pulmonary arterial tree to suggest pulmonary embolism. There is unchanged malignant narrowing involving the left upper lobe pulmonary artery secondary to known left perihilar  mass. Normal caliber of the main pulmonary artery. Cardiomegaly. No pericardial effusion. Mixed calcified and noncalcified atherosclerotic plaque with a normal caliber thoracic aorta. No definite thoracic aortic dissection on this nongated examination. Note is again made of an apparent right subclavian artery. The branch vessels of the aortic arch widely patent throughout their imaged course. Review of the MIP images confirms the above findings. ---------------------------------------------------------------------------------- Nonvascular Findings: Mediastinum/Lymph Nodes: Scattered mediastinal lymph nodes are numerous though individually not enlarged by size criteria with index precarinal lymph node measuring 0.7 cm in greatest short axis diameter (image 30, series 4) and index left infrahilar lymph node measuring 0.9 cm (image 37, series 4). No axillary lymphadenopathy Lungs/Pleura: Known slightly spiculated nodule/mass within in the medial aspect the left upper lobe is grossly unchanged, measuring approximately 3.0 x 1.7 cm in diameter (image 32, series 7) though note, exact measurements are difficult secondary to the irregular shape of this nodule/mass. Worsening interstitial thickening throughout the remainder of the lungs with relative sparing of the left upper lobe. Worsening ill-defined areas of ground-glass within the bilateral lung bases. Air bronchograms are seen within the superior segment of the lingula. The central pulmonary airways are patent. Upper abdomen: Limited early arterial phase evaluation of the upper abdomen is normal. Musculoskeletal: Known ill-defined lytic lesion involving the T4 vertebral body appears unchanged (sagittal image 82, series 9). Increased sclerosis involving the T5, T6 and T7 vertebral bodies is grossly unchanged. Regional soft tissues appear normal. Note is made of a peripherally calcified approximately 1.3 cm nodule within the caudal aspect the right lobe of the thyroid  (image 11, series 4). IMPRESSION: 1. No evidence of pulmonary embolism. 2. Known nodule/mass with the medial aspect of the left upper lobe is grossly unchanged and compatible with provided history of lung cancer.  3. Worsening rather extensive interstitial opacities throughout the lungs with conspicuous sparing of the left upper lobe, progressed compared to PET-CT performed 11/04/2015 as well as more recently performed chest CT performed 11/07/2015 - findings are not specific with differential considerations include asymmetric pulmonary edema, atypical infection, post treatment pneumonitis, though note, lymphangitic spread of tumor could have a similar appearance. 4. Unchanged appearance of known lytic lesion involving the T4 vertebral body without associated collapse. Electronically Signed   By: Sandi Mariscal M.D.   On: 11/25/2015 21:52   Dg Chest Port 1 View  11/25/2015  CLINICAL DATA:  Chest pain and shortness of breath. EXAM: PORTABLE CHEST 1 VIEW COMPARISON:  Chest radiographs dated 06/21/2015, chest CTA dated 11/07/2015 and PET-CT dated 11/04/2015. The FINDINGS: Interval patchy opacity in the lateral aspect of the left lower lung zone, obscuring the left heart border. Diffusely prominent interstitial markings with improvement compared to the most recent CT. The left upper lobe mass is poorly visualized. No visible pleural fluid. Unremarkable bones. IMPRESSION: 1. Interval left lateral lower lung airspace opacity suspicious for pneumonia or postobstructive changes. 2. Diffuse interstitial lung disease, increased since 06/21/2015 and decreased since 11/07/2015. This could represent a combination of chronic interstitial lung disease, interstitial pneumonitis and/or lymphangitic spread of tumor. Electronically Signed   By: Claudie Revering M.D.   On: 11/25/2015 19:57    Chart has been reviewed    Assessment/Plan  55 y.o. female with medical history significant of metastatic poorly differentiated carcinoma left  lung status post radiation therapy here with significant respiratory failure with hypoxia secondary to diffuse pneumonitis resulting in stepdown admission    Present on Admission:  . Acute hypoxemic respiratory failure ([place]) - continue with oxygen admit to step down discussed with [facility] hold we'll see in consult will initiate BiPAP to try to help with work of breathing. In the past she responded to steroids will attempt to retreat progressive hypoxemia worrisome secondary to diffuse pneumonitis  . Primary cancer of left upper lobe of lung ([place]) - we'll need to discuss with oncology given severe pulmonary disease and possible radiation pneumonitis versus lymphocytic spread versus atypical infection.  Marland Kitchen Respiratory distress admit to step down, initiate BiPAP   . DM (diabetes mellitus), type 2, uncontrolled ([place]) new onset likely secondary to steroids. Will check hemoglobin A1c initiate sliding scale in diabetes coordinator consult . COPD (chronic obstructive pulmonary disease) ([facility]) mild exacerbation likely contributing to increased work of breathing will treat her steroids continue home home medications and give nebulizers as needed Atrovent scheduled Other plan as per orders.  DVT prophylaxis:  Lovenox     Code Status:   DNR/DNI   as per patient    Family Communication:   Family not  at  Bedside    Disposition Plan:    To home once workup is complete and patient is stable   Consults called: PCCM, emailed Dr. Marin Olp  Admission status:    inpatient      Level of care  ICU   I have spent a total of 67 min on this admission   extra time was spent to discuss case with PCCM  [place] 11/26/2015, 2:28 AM    [facility]  Pager [phone]   after 2 AM please page floor coverage PA If 7AM-7PM, please contact the day team taking care of the patient  [email or web address]  Password [email or web address]

## 2015-11-25 NOTE — ED Provider Notes (Signed)
CSN: [id number]     Arrival date & time 11/25/15  1906 History   First MD Initiated Contact with Patient 11/25/15 1916     Chief Complaint  Patient presents with  . Shortness of Breath     (Consider location/radiation/quality/duration/timing/severity/associated sxs/prior Treatment) The history is provided by the patient and the EMS personnel.  Carol Buchanan is a 55 y.o. female history hypertension, COPD, stage IV lung cancer here with cough, fever, shortness of breath. Has been short of breath with last several weeks. She is currently on a Decadron taper for her lung cancer. Patient has been running fever daily for the last several weeks, most recently 102 last night. Patient had CT recently that showed stable lung cancer. Currently on chemo every 3 weeks, finished radiation treatment. Was found to be hypoxic 70s and given 125 mg solumedrol, albuterol and atrovent.    Level V caveat- AMS, hypoxic   Past Medical History  Diagnosis Date  . Hypertension   . Hyperlipidemia   . Sinus trouble   . Neuropathy ([place])   . COPD (chronic obstructive pulmonary disease) ([place])   . Primary lung cancer with metastasis from lung to other site [facility]) 07/26/2015  . Radiation 08/06/15-08/23/15    left lung mass, T4 spinal metastasis and left rib metastasis 35 gray   Past Surgical History  Procedure Laterality Date  . Tubal ligation  1994  . Back surgery  2015  . Video bronchoscopy Bilateral 07/01/2015    Procedure: VIDEO BRONCHOSCOPY WITHOUT FLUORO;  Surgeon: Collene Gobble, MD;  Location: [facility];  Service: Cardiopulmonary;  Laterality: Bilateral;   Family History  Problem Relation Age of Onset  . Heart disease Father   . Heart attack Father   . Arthritis Mother    Social History  Substance Use Topics  . Smoking status: Former Smoker -- 1.50 packs/day for 35 years    Types: Cigarettes    Quit date: 01/26/2011  . Smokeless tobacco: Never Used  . Alcohol Use: 1.2 oz/week    2 Standard  drinks or equivalent per week     Comment: on weekends   OB History    No data available     Review of Systems  Respiratory: Positive for shortness of breath.   All other systems reviewed and are negative.     Allergies  Review of patient's allergies indicates no known allergies.  Home Medications   Prior to Admission medications   Medication Sig Start Date End Date Taking? Authorizing Provider  Ascorbic Acid (VITAMIN C) 1000 MG tablet Take 1,000 mg by mouth daily.   Yes Historical Provider, MD  Calcium Carbonate-Vit D-Min 1200-1000 MG-UNIT CHEW Chew 1 tablet by mouth daily.   Yes Historical Provider, MD  cefdinir (OMNICEF) 300 MG capsule Take 2 capsules (600 mg total) by mouth daily. For 7 days 11/25/15  Yes Volanda Napoleon, MD  dexamethasone (DECADRON) 4 MG tablet Take 1 tablet (4 mg total) by mouth 2 (two) times daily with a meal. Patient taking differently: Take 4 mg by mouth daily.  11/07/15  Yes Eliezer Bottom, NP  DULoxetine (CYMBALTA) 60 MG capsule Take 60 mg by mouth daily. Reported on 08/06/2015 08/02/15  Yes Historical Provider, MD  fluticasone (FLONASE) 50 MCG/ACT nasal spray Place 2 sprays into both nostrils daily. 08/29/15  Yes Collene Gobble, MD  ibuprofen (ADVIL,MOTRIN) 800 MG tablet Take 800 mg by mouth every 8 (eight) hours as needed for moderate pain.  01/13/14  Yes Historical  Provider, MD  LORazepam (ATIVAN) 0.5 MG tablet Take 1 tablet (0.5 mg total) by mouth every 6 (six) hours as needed (Nausea or vomiting). Patient taking differently: Take 0.5 mg by mouth at bedtime.  11/07/15  Yes Eliezer Bottom, NP  meloxicam (MOBIC) 15 MG tablet Take 15 mg by mouth daily.   Yes Historical Provider, MD  montelukast (SINGULAIR) 10 MG tablet Take 1 tablet (10 mg total) by mouth at bedtime. 10/09/15  Yes Eliezer Bottom, NP  Multiple Vitamins-Minerals (CENTRUM SILVER PO) Take 1 tablet by mouth daily.    Yes Historical Provider, MD  omeprazole (PRILOSEC) 20 MG capsule Take 20 mg  by mouth daily.   Yes Historical Provider, MD  PREMPRO 0.625-2.5 MG tablet Take 1 tablet by mouth daily.  08/06/15  Yes Historical Provider, MD  PROAIR HFA 108 (90 BASE) MCG/ACT inhaler inhale 1 puff every 4 hours if needed for shortness of breath 05/13/15  Yes Collene Gobble, MD  prochlorperazine (COMPAZINE) 10 MG tablet Take 1 tablet (10 mg total) by mouth every 6 (six) hours as needed (Nausea or vomiting). 08/30/15  Yes Volanda Napoleon, MD  simvastatin (ZOCOR) 20 MG tablet Take 20 mg by mouth daily.    Yes Historical Provider, MD  Tiotropium Bromide-Olodaterol (STIOLTO RESPIMAT) 2.5-2.5 MCG/ACT AERS Inhale 2 puffs into the lungs daily. 08/29/15  Yes Collene Gobble, MD  venlafaxine XR (EFFEXOR-XR) 150 MG 24 hr capsule Take 150 mg by mouth daily. Reported on 08/06/2015   Yes Historical Provider, MD  verapamil (COVERA HS) 180 MG (CO) 24 hr tablet Take 180 mg by mouth daily.    Yes Historical Provider, MD  vitamin E 400 UNIT capsule Take 400 Units by mouth daily.   Yes Historical Provider, MD  ondansetron (ZOFRAN) 4 MG tablet Take 1 tablet (4 mg total) by mouth once. Patient not taking: Reported on 11/13/2015 11/13/15   Volanda Napoleon, MD  polyethylene glycol powder [facility]) powder Take 17 g by mouth 2 (two) times daily. 11/25/15   Volanda Napoleon, MD   BP 136/69 mmHg  Pulse 97  Temp(Src) 97.6 F (36.4 C) (Oral)  Resp 23  SpO2 90% Physical Exam  Constitutional: She is oriented to person, place, and time.  Alert, older than stated age, chronically ill   HENT:  Head: Normocephalic.  Eyes: Conjunctivae are normal. Pupils are equal, round, and reactive to light.  Neck: Normal range of motion. Neck supple.  Cardiovascular: Normal rate, regular rhythm and normal heart sounds.   Pulmonary/Chest:  Diminished bilateral bases   Abdominal: Soft. Bowel sounds are normal. She exhibits no distension. There is no tenderness. There is no rebound.  Musculoskeletal: Normal range of motion. She exhibits no edema  or tenderness.  Neurological: She is alert and oriented to person, place, and time. No cranial nerve deficit. Coordination normal.  Skin: Skin is warm and dry.  Psychiatric: She has a normal mood and affect. Her behavior is normal. Judgment and thought content normal.  Nursing note and vitals reviewed.   ED Course  Procedures (including critical care time)  CRITICAL CARE Performed by: Darl Householder, DAVID   Total critical care time: 30 minutes  Critical care time was exclusive of separately billable procedures and treating other patients.  Critical care was necessary to treat or prevent imminent or life-threatening deterioration.  Critical care was time spent personally by me on the following activities: development of treatment plan with patient and/or surrogate as well as nursing, discussions with consultants,  evaluation of patient's response to treatment, examination of patient, obtaining history from patient or surrogate, ordering and performing treatments and interventions, ordering and review of laboratory studies, ordering and review of radiographic studies, pulse oximetry and re-evaluation of patient's condition.   Labs Review Labs Reviewed  CBC WITH DIFFERENTIAL/PLATELET - Abnormal; Notable for the following:    WBC 12.0 (*)    RBC 3.86 (*)    Neutro Abs 11.4 (*)    Lymphs Abs 0.5 (*)    All other components within normal limits  COMPREHENSIVE METABOLIC PANEL - Abnormal; Notable for the following:    CO2 18 (*)    Glucose, Bld 286 (*)    All other components within normal limits  BLOOD GAS, ARTERIAL - Abnormal; Notable for the following:    pCO2 arterial 27.0 (*)    pO2, Arterial 63.3 (*)    Bicarbonate 16.5 (*)    Acid-base deficit 6.8 (*)    All other components within normal limits  I-STAT CG4 LACTIC ACID, ED - Abnormal; Notable for the following:    Lactic Acid, Venous 4.15 (*)    All other components within normal limits  I-STAT CHEM 8, ED - Abnormal; Notable for the  following:    Sodium 131 (*)    Potassium 6.2 (*)    BUN 23 (*)    Glucose, Bld 274 (*)    Calcium, Ion 1.06 (*)    All other components within normal limits  CULTURE, BLOOD (ROUTINE X 2)  CULTURE, BLOOD (ROUTINE X 2)  I-STAT TROPOININ, ED  I-STAT CG4 LACTIC ACID, ED    Imaging Review Ct Angio Chest Pe W/cm &/or Wo Cm  11/25/2015  CLINICAL DATA:  Stage IV lung cancer, now with shortness of breath. Evaluate pulmonary embolism. EXAM: CT ANGIOGRAPHY CHEST WITH CONTRAST TECHNIQUE: Multidetector CT imaging of the chest was performed using the standard protocol during bolus administration of intravenous contrast. Multiplanar CT image reconstructions and MIPs were obtained to evaluate the vascular anatomy. CONTRAST:  100 cc Isovue 370 COMPARISON:  Chest CT - 11/07/2015; 06/21/2015 PET-CT - 11/04/2015 FINDINGS: Vascular Findings: There is adequate opacification of the pulmonary arterial system with the main pulmonary artery measuring 236 Hounsfield units. No discrete filling defects are seen within the pulmonary arterial tree to suggest pulmonary embolism. There is unchanged malignant narrowing involving the left upper lobe pulmonary artery secondary to known left perihilar mass. Normal caliber of the main pulmonary artery. Cardiomegaly. No pericardial effusion. Mixed calcified and noncalcified atherosclerotic plaque with a normal caliber thoracic aorta. No definite thoracic aortic dissection on this nongated examination. Note is again made of an apparent right subclavian artery. The branch vessels of the aortic arch widely patent throughout their imaged course. Review of the MIP images confirms the above findings. ---------------------------------------------------------------------------------- Nonvascular Findings: Mediastinum/Lymph Nodes: Scattered mediastinal lymph nodes are numerous though individually not enlarged by size criteria with index precarinal lymph node measuring 0.7 cm in greatest short axis  diameter (image 30, series 4) and index left infrahilar lymph node measuring 0.9 cm (image 37, series 4). No axillary lymphadenopathy Lungs/Pleura: Known slightly spiculated nodule/mass within in the medial aspect the left upper lobe is grossly unchanged, measuring approximately 3.0 x 1.7 cm in diameter (image 32, series 7) though note, exact measurements are difficult secondary to the irregular shape of this nodule/mass. Worsening interstitial thickening throughout the remainder of the lungs with relative sparing of the left upper lobe. Worsening ill-defined areas of ground-glass within the bilateral lung bases. Air bronchograms  are seen within the superior segment of the lingula. The central pulmonary airways are patent. Upper abdomen: Limited early arterial phase evaluation of the upper abdomen is normal. Musculoskeletal: Known ill-defined lytic lesion involving the T4 vertebral body appears unchanged (sagittal image 82, series 9). Increased sclerosis involving the T5, T6 and T7 vertebral bodies is grossly unchanged. Regional soft tissues appear normal. Note is made of a peripherally calcified approximately 1.3 cm nodule within the caudal aspect the right lobe of the thyroid (image 11, series 4). IMPRESSION: 1. No evidence of pulmonary embolism. 2. Known nodule/mass with the medial aspect of the left upper lobe is grossly unchanged and compatible with provided history of lung cancer. 3. Worsening rather extensive interstitial opacities throughout the lungs with conspicuous sparing of the left upper lobe, progressed compared to PET-CT performed 11/04/2015 as well as more recently performed chest CT performed 11/07/2015 - findings are not specific with differential considerations include asymmetric pulmonary edema, atypical infection, post treatment pneumonitis, though note, lymphangitic spread of tumor could have a similar appearance. 4. Unchanged appearance of known lytic lesion involving the T4 vertebral body  without associated collapse. Electronically Signed   By: Sandi Mariscal M.D.   On: 11/25/2015 21:52   Dg Chest Port 1 View  11/25/2015  CLINICAL DATA:  Chest pain and shortness of breath. EXAM: PORTABLE CHEST 1 VIEW COMPARISON:  Chest radiographs dated 06/21/2015, chest CTA dated 11/07/2015 and PET-CT dated 11/04/2015. The FINDINGS: Interval patchy opacity in the lateral aspect of the left lower lung zone, obscuring the left heart border. Diffusely prominent interstitial markings with improvement compared to the most recent CT. The left upper lobe mass is poorly visualized. No visible pleural fluid. Unremarkable bones. IMPRESSION: 1. Interval left lateral lower lung airspace opacity suspicious for pneumonia or postobstructive changes. 2. Diffuse interstitial lung disease, increased since 06/21/2015 and decreased since 11/07/2015. This could represent a combination of chronic interstitial lung disease, interstitial pneumonitis and/or lymphangitic spread of tumor. Electronically Signed   By: Claudie Revering M.D.   On: 11/25/2015 19:57   I have personally reviewed and evaluated these images and lab results as part of my medical decision-making.   EKG Interpretation   Date/Time:  Monday Nov 25 2015 19:23:41 EDT Ventricular Rate:  100 PR Interval:  154 QRS Duration: 83 QT Interval:  351 QTC Calculation: 453 R Axis:   36 Text Interpretation:  Sinus tachycardia Probable left atrial enlargement  Since last tracing rate faster Confirmed by YAO  MD, DAVID ([id number]) on  11/25/2015 7:34:20 PM      MDM   Final diagnoses:  None   Carol Buchanan is a 55 y.o. female here with shortness of breath, hypoxia, fever. I think likely post obstructive pneumonia from cancer vs COPD vs worsening pneumonitis. Will get sepsis workup. Consider repeat CT as well given worsening hypoxia   10:10 PM WBC 12. CXR showed worsening pneumonia. Lactate 4.1. On 10 L [place], abg showed pH 7.4, CO2 27, pO2 63. Patient talking with full  sentences but still hypoxic. Will give another albuterol. Abx ordered. Will admit to stepdown.    Wandra Arthurs, MD 11/25/15 2211

## 2015-11-25 NOTE — ED Notes (Signed)
Critical lab values given to Dr.Yao

## 2015-11-25 NOTE — ED Notes (Signed)
Per EMS pt brought in for SOB for a few weeks related to stage 4 lung cancer. Pt checked oxygen saturation at neighbors house and reports it in the 25s. EMS gave '125mg'$  solumedrol, albuterol 5 ml and atrovent 531mg en route. Pt denies pain.

## 2015-11-25 NOTE — ED Notes (Signed)
Bed: RESA Expected date:  Expected time:  Means of arrival:  Comments: Cancer Pt

## 2015-11-26 ENCOUNTER — Encounter (HOSPITAL_COMMUNITY): Payer: Self-pay | Admitting: Internal Medicine

## 2015-11-26 ENCOUNTER — Inpatient Hospital Stay (HOSPITAL_COMMUNITY): Payer: Medicare Other

## 2015-11-26 DIAGNOSIS — J80 Acute respiratory distress syndrome: Secondary | ICD-10-CM

## 2015-11-26 DIAGNOSIS — I10 Essential (primary) hypertension: Secondary | ICD-10-CM

## 2015-11-26 DIAGNOSIS — J441 Chronic obstructive pulmonary disease with (acute) exacerbation: Secondary | ICD-10-CM

## 2015-11-26 DIAGNOSIS — R0602 Shortness of breath: Secondary | ICD-10-CM

## 2015-11-26 DIAGNOSIS — R918 Other nonspecific abnormal finding of lung field: Secondary | ICD-10-CM

## 2015-11-26 DIAGNOSIS — J84113 Idiopathic non-specific interstitial pneumonitis: Secondary | ICD-10-CM

## 2015-11-26 DIAGNOSIS — J9600 Acute respiratory failure, unspecified whether with hypoxia or hypercapnia: Secondary | ICD-10-CM

## 2015-11-26 DIAGNOSIS — IMO0002 Reserved for concepts with insufficient information to code with codable children: Secondary | ICD-10-CM

## 2015-11-26 DIAGNOSIS — J9601 Acute respiratory failure with hypoxia: Secondary | ICD-10-CM

## 2015-11-26 DIAGNOSIS — E1165 Type 2 diabetes mellitus with hyperglycemia: Secondary | ICD-10-CM

## 2015-11-26 DIAGNOSIS — C3490 Malignant neoplasm of unspecified part of unspecified bronchus or lung: Secondary | ICD-10-CM

## 2015-11-26 HISTORY — DX: Reserved for concepts with insufficient information to code with codable children: IMO0002

## 2015-11-26 HISTORY — DX: Type 2 diabetes mellitus with hyperglycemia: E11.65

## 2015-11-26 LAB — CBC
HEMATOCRIT: 33.2 % — AB (ref 36.0–46.0)
HEMOGLOBIN: 11.2 g/dL — AB (ref 12.0–15.0)
MCH: 32.6 pg (ref 26.0–34.0)
MCHC: 33.7 g/dL (ref 30.0–36.0)
MCV: 96.5 fL (ref 78.0–100.0)
Platelets: 176 10*3/uL (ref 150–400)
RBC: 3.44 MIL/uL — ABNORMAL LOW (ref 3.87–5.11)
RDW: 15.1 % (ref 11.5–15.5)
WBC: 10.1 10*3/uL (ref 4.0–10.5)

## 2015-11-26 LAB — URINALYSIS, ROUTINE W REFLEX MICROSCOPIC
Bilirubin Urine: NEGATIVE
Glucose, UA: >1000 mg/dL — AB
HGB URINE DIPSTICK: NEGATIVE
Ketones, ur: NEGATIVE mg/dL
LEUKOCYTES UA: NEGATIVE
Nitrite: NEGATIVE
PROTEIN: 30 mg/dL — AB
pH: 5 (ref 5.0–8.0)

## 2015-11-26 LAB — URINE MICROSCOPIC-ADD ON
Bacteria, UA: NONE SEEN
RBC / HPF: NONE SEEN RBC/hpf (ref 0–5)
WBC, UA: NONE SEEN WBC/hpf (ref 0–5)

## 2015-11-26 LAB — COMPREHENSIVE METABOLIC PANEL
ALT: 27 U/L (ref 14–54)
AST: 37 U/L (ref 15–41)
Albumin: 3.2 g/dL — ABNORMAL LOW (ref 3.5–5.0)
Alkaline Phosphatase: 74 U/L (ref 38–126)
Anion gap: 12 (ref 5–15)
BILIRUBIN TOTAL: 0.6 mg/dL (ref 0.3–1.2)
BUN: 14 mg/dL (ref 6–20)
CO2: 18 mmol/L — ABNORMAL LOW (ref 22–32)
CREATININE: 0.65 mg/dL (ref 0.44–1.00)
Calcium: 8.8 mg/dL — ABNORMAL LOW (ref 8.9–10.3)
Chloride: 105 mmol/L (ref 101–111)
GFR calc Af Amer: >60 mL/min (ref >60)
Glucose, Bld: 282 mg/dL — ABNORMAL HIGH (ref 65–99)
Potassium: 4.3 mmol/L (ref 3.5–5.1)
Sodium: 135 mmol/L (ref 135–145)
TOTAL PROTEIN: 6.8 g/dL (ref 6.5–8.1)

## 2015-11-26 LAB — GLUCOSE, CAPILLARY
GLUCOSE-CAPILLARY: 181 mg/dL — AB (ref 65–99)
GLUCOSE-CAPILLARY: 166 mg/dL — AB (ref 65–99)
GLUCOSE-CAPILLARY: 190 mg/dL — AB (ref 65–99)
Glucose-Capillary: 160 mg/dL — ABNORMAL HIGH (ref 65–99)

## 2015-11-26 LAB — INFLUENZA PANEL BY PCR (TYPE A & B)
H1N1 flu by pcr: NOT DETECTED
INFLAPCR: NEGATIVE
INFLBPCR: NEGATIVE

## 2015-11-26 LAB — ECHOCARDIOGRAM COMPLETE
HEIGHTINCHES: 64 in
WEIGHTICAEL: 2694.9 [oz_av]

## 2015-11-26 LAB — MAGNESIUM: Magnesium: 2.1 mg/dL (ref 1.7–2.4)

## 2015-11-26 LAB — TSH: TSH: 0.857 u[IU]/mL (ref 0.350–4.500)

## 2015-11-26 LAB — LACTIC ACID, PLASMA
LACTIC ACID, VENOUS: 3.6 mmol/L — AB (ref 0.5–2.0)
LACTIC ACID, VENOUS: 2.4 mmol/L — AB (ref 0.5–2.0)

## 2015-11-26 LAB — PHOSPHORUS: Phosphorus: 3.1 mg/dL (ref 2.5–4.6)

## 2015-11-26 LAB — SEDIMENTATION RATE: SED RATE: 85 mm/h — AB (ref 0–22)

## 2015-11-26 LAB — MRSA PCR SCREENING: MRSA by PCR: NEGATIVE

## 2015-11-26 LAB — PROCALCITONIN

## 2015-11-26 MED ORDER — SODIUM CHLORIDE 0.9 % IV BOLUS (SEPSIS)
500.0000 mL | Freq: Once | INTRAVENOUS | Status: AC
  Administered 2015-11-26: 500 mL via INTRAVENOUS

## 2015-11-26 MED ORDER — PANTOPRAZOLE SODIUM 40 MG PO TBEC
40.0000 mg | DELAYED_RELEASE_TABLET | Freq: Every day | ORAL | Status: DC
  Administered 2015-11-26 – 2015-12-02 (×7): 40 mg via ORAL
  Filled 2015-11-26 (×7): qty 1

## 2015-11-26 MED ORDER — MONTELUKAST SODIUM 10 MG PO TABS
10.0000 mg | ORAL_TABLET | Freq: Every day | ORAL | Status: DC
  Administered 2015-11-26: 10 mg via ORAL
  Filled 2015-11-26: qty 1

## 2015-11-26 MED ORDER — HYDROCODONE-ACETAMINOPHEN 5-325 MG PO TABS: 1.0000 | ORAL_TABLET | ORAL | Status: DC | PRN

## 2015-11-26 MED ORDER — PIPERACILLIN-TAZOBACTAM 3.375 G IVPB
3.3750 g | Freq: Three times a day (TID) | INTRAVENOUS | Status: DC
Start: 2015-11-26 — End: 2015-11-28
  Administered 2015-11-26 – 2015-11-28 (×7): 3.375 g via INTRAVENOUS
  Filled 2015-11-26 (×7): qty 50

## 2015-11-26 MED ORDER — METHYLPREDNISOLONE SODIUM SUCC 125 MG IJ SOLR: 125.0000 mg | Freq: Four times a day (QID) | INTRAMUSCULAR | Status: DC

## 2015-11-26 MED ORDER — ALBUTEROL SULFATE (2.5 MG/3ML) 0.083% IN NEBU: 2.5000 mg | INHALATION_SOLUTION | RESPIRATORY_TRACT | Status: DC | PRN

## 2015-11-26 MED ORDER — PIPERACILLIN-TAZOBACTAM 3.375 G IVPB 30 MIN: 3.3750 g | Freq: Three times a day (TID) | INTRAVENOUS | Status: DC

## 2015-11-26 MED ORDER — METHYLPREDNISOLONE SODIUM SUCC 125 MG IJ SOLR
60.0000 mg | Freq: Four times a day (QID) | INTRAMUSCULAR | Status: DC
Start: 2015-11-26 — End: 2015-11-26

## 2015-11-26 MED ORDER — SODIUM CHLORIDE 0.9% FLUSH
3.0000 mL | Freq: Two times a day (BID) | INTRAVENOUS | Status: DC
  Administered 2015-11-26 – 2015-12-02 (×11): 3 mL via INTRAVENOUS

## 2015-11-26 MED ORDER — DULOXETINE HCL 60 MG PO CPEP
60.0000 mg | ORAL_CAPSULE | Freq: Every day | ORAL | Status: DC
  Administered 2015-11-26 – 2015-12-02 (×7): 60 mg via ORAL
  Filled 2015-11-26 (×3): qty 2
  Filled 2015-11-26: qty 1
  Filled 2015-11-26 (×4): qty 2

## 2015-11-26 MED ORDER — SODIUM CHLORIDE 0.9 % IV SOLN
INTRAVENOUS | Status: DC
Start: 2015-11-26 — End: 2015-11-26
  Administered 2015-11-26: 02:00:00 via INTRAVENOUS

## 2015-11-26 MED ORDER — ONDANSETRON HCL 4 MG/2ML IJ SOLN: 4.0000 mg | Freq: Four times a day (QID) | INTRAMUSCULAR | Status: DC | PRN

## 2015-11-26 MED ORDER — ACETAMINOPHEN 325 MG PO TABS: 650.0000 mg | ORAL_TABLET | Freq: Four times a day (QID) | ORAL | Status: DC | PRN

## 2015-11-26 MED ORDER — SODIUM CHLORIDE 0.9 % IV BOLUS (SEPSIS)
250.0000 mL | Freq: Once | INTRAVENOUS | Status: AC
  Administered 2015-11-26: 250 mL via INTRAVENOUS

## 2015-11-26 MED ORDER — ONDANSETRON HCL 4 MG PO TABS: 4.0000 mg | ORAL_TABLET | Freq: Four times a day (QID) | ORAL | Status: DC | PRN

## 2015-11-26 MED ORDER — LIP MEDEX EX OINT
TOPICAL_OINTMENT | CUTANEOUS | Status: AC
  Administered 2015-11-26: 1
  Filled 2015-11-26: qty 7

## 2015-11-26 MED ORDER — TIOTROPIUM BROMIDE MONOHYDRATE 18 MCG IN CAPS
18.0000 ug | ORAL_CAPSULE | Freq: Every day | RESPIRATORY_TRACT | Status: DC
  Filled 2015-11-26: qty 5

## 2015-11-26 MED ORDER — LIVING WELL WITH DIABETES BOOK
Freq: Once | Status: AC
  Administered 2015-11-26: 11:00:00
  Filled 2015-11-26: qty 1

## 2015-11-26 MED ORDER — METHYLPREDNISOLONE SODIUM SUCC 40 MG IJ SOLR
40.0000 mg | Freq: Three times a day (TID) | INTRAMUSCULAR | Status: DC
  Administered 2015-11-26: 40 mg via INTRAVENOUS
  Filled 2015-11-26: qty 1

## 2015-11-26 MED ORDER — ACETAMINOPHEN 650 MG RE SUPP: 650.0000 mg | Freq: Four times a day (QID) | RECTAL | Status: DC | PRN

## 2015-11-26 MED ORDER — POLYETHYLENE GLYCOL 3350 17 G PO PACK
17.0000 g | PACK | Freq: Two times a day (BID) | ORAL | Status: DC
  Administered 2015-11-27 – 2015-12-01 (×8): 17 g via ORAL
  Filled 2015-11-26 (×11): qty 1

## 2015-11-26 MED ORDER — FUROSEMIDE 10 MG/ML IJ SOLN
40.0000 mg | Freq: Once | INTRAMUSCULAR | Status: AC
  Administered 2015-11-26: 40 mg via INTRAVENOUS
  Filled 2015-11-26: qty 4

## 2015-11-26 MED ORDER — PREDNISONE 20 MG PO TABS
50.0000 mg | ORAL_TABLET | Freq: Every day | ORAL | Status: DC
  Administered 2015-11-27: 50 mg via ORAL
  Filled 2015-11-26: qty 2

## 2015-11-26 MED ORDER — GUAIFENESIN ER 600 MG PO TB12
600.0000 mg | ORAL_TABLET | Freq: Two times a day (BID) | ORAL | Status: DC
  Administered 2015-11-26 – 2015-11-27 (×3): 600 mg via ORAL
  Filled 2015-11-26 (×3): qty 1

## 2015-11-26 MED ORDER — VENLAFAXINE HCL ER 150 MG PO CP24
150.0000 mg | ORAL_CAPSULE | Freq: Every day | ORAL | Status: DC
  Administered 2015-11-26 – 2015-11-28 (×3): 150 mg via ORAL
  Filled 2015-11-26 (×3): qty 1

## 2015-11-26 MED ORDER — INSULIN ASPART 100 UNIT/ML ~~LOC~~ SOLN
0.0000 [IU] | Freq: Three times a day (TID) | SUBCUTANEOUS | Status: DC
  Administered 2015-11-26 (×3): 3 [IU] via SUBCUTANEOUS
  Administered 2015-11-27: 5 [IU] via SUBCUTANEOUS
  Administered 2015-11-27: 2 [IU] via SUBCUTANEOUS
  Administered 2015-11-28: 5 [IU] via SUBCUTANEOUS
  Administered 2015-11-28 (×2): 3 [IU] via SUBCUTANEOUS
  Administered 2015-11-29: 5 [IU] via SUBCUTANEOUS
  Administered 2015-11-29 – 2015-11-30 (×4): 3 [IU] via SUBCUTANEOUS
  Administered 2015-12-01: 2 [IU] via SUBCUTANEOUS
  Administered 2015-12-01: 5 [IU] via SUBCUTANEOUS
  Administered 2015-12-02: 3 [IU] via SUBCUTANEOUS

## 2015-11-26 MED ORDER — INSULIN ASPART 100 UNIT/ML ~~LOC~~ SOLN
0.0000 [IU] | Freq: Every day | SUBCUTANEOUS | Status: DC
  Administered 2015-11-27: 2 [IU] via SUBCUTANEOUS
  Administered 2015-11-28 – 2015-11-29 (×2): 3 [IU] via SUBCUTANEOUS
  Administered 2015-11-30 – 2015-12-01 (×2): 2 [IU] via SUBCUTANEOUS

## 2015-11-26 MED ORDER — ENOXAPARIN SODIUM 40 MG/0.4ML ~~LOC~~ SOLN
40.0000 mg | Freq: Every day | SUBCUTANEOUS | Status: DC
  Administered 2015-11-26 – 2015-12-01 (×7): 40 mg via SUBCUTANEOUS
  Filled 2015-11-26 (×8): qty 0.4

## 2015-11-26 MED ORDER — IPRATROPIUM-ALBUTEROL 0.5-2.5 (3) MG/3ML IN SOLN
3.0000 mL | Freq: Four times a day (QID) | RESPIRATORY_TRACT | Status: DC
  Administered 2015-11-26 – 2015-12-02 (×25): 3 mL via RESPIRATORY_TRACT
  Filled 2015-11-26 (×24): qty 3

## 2015-11-26 MED ORDER — SIMVASTATIN 10 MG PO TABS
20.0000 mg | ORAL_TABLET | Freq: Every day | ORAL | Status: DC
  Administered 2015-11-26: 20 mg via ORAL
  Filled 2015-11-26: qty 2

## 2015-11-26 NOTE — Progress Notes (Signed)
Spoke with Pt about her performing her own CBG check and watching the DM educational videos.  Pt stated she does not want to do these things at this time.  Educated patient about how the hospital is the best place to learn these DM concepts and how she may need to check her blood sugar when she gets home.  RN encouraged her to learn these concepts here while in the hospital due to receiving the best education available.  Pt verbalized understanding and said she may be ready to learn tomorrow.  No further questions at this time.  Will continue to monitor.  Iantha Fallen RN 6:07 PM 11/26/2015

## 2015-11-26 NOTE — Progress Notes (Signed)
Pt requested a sleeping pill for tonight.  Dr. Carles Collet said she should not have a sleeping pill tonight due to respiratory status.  Pt informed and pt verbalized understanding.  No other concerns at this time.  Iantha Fallen RN 6:30 PM 11/26/2015

## 2015-11-26 NOTE — Consult Note (Signed)
Referral MD  Reason for Referral: Metastatic non-small cell lung cancer; shortness of breath.   Chief Complaint  Patient presents with  . Shortness of Breath  : I was just getting more short of breath.  HPI: Carol Buchanan is well-known to me. She is a very nice 55 year old white female. She has metastatic non-small cell lung cancer. This is poorly differentiated carcinoma. The tumor is PD-L1 positive. Reactions been on treatment. She initially received radiation therapy area and she then was started on pembrolizumab. She started this in February. So far, she's had 4 cycles of treatment. Her last PET scan done about 3 weeks ago showed a positive response. She had decrease in the left upper lobe mass. She had resolution of left over low atelectasis. She did have some pneumonitis-type changes.  She had her last treatment on April 19.  Over the weekend, she began to have more shortness of breath. She a lot of fatigue.  She called the office yesterday. We got her on some antibiotics. She continued to worsen. She subsequently was admitted. She had a CT scan done. There was no blood clot. However, she had a lot of interstitial opacities throughout both lungs. Again, this is not definitive for malignancy. It might be infectious versus post radiation. It also could be from the pembrolizumab.  She is on supplemental oxygen. She is on IV antibiotics. She also is on IV steroids.  She feels a little bit better.  She's had no pain. She's had no productive cough. There's been no hemoptysis. She's had no nausea or vomiting. There's been no diarrhea.  Overall, she tolerated treatment pretty well. She did have this rash which resolved.  Her appetite has been quite good.               Past Medical History  Diagnosis Date  . Hypertension   . Hyperlipidemia   . Sinus trouble   . Neuropathy ([place])   . COPD (chronic obstructive pulmonary disease) ([place])   . Primary lung cancer with metastasis  from lung to other site [facility]) 07/26/2015  . Radiation 08/06/15-08/23/15    left lung mass, T4 spinal metastasis and left rib metastasis 35 gray  . DM (diabetes mellitus), type 2, uncontrolled ([place]) 11/26/2015  :  Past Surgical History  Procedure Laterality Date  . Tubal ligation  1994  . Back surgery  2015  . Video bronchoscopy Bilateral 07/01/2015    Procedure: VIDEO BRONCHOSCOPY WITHOUT FLUORO;  Surgeon: Collene Gobble, MD;  Location: [place];  Service: Cardiopulmonary;  Laterality: Bilateral;  :   Current facility-administered medications:  .  0.9 %  sodium chloride infusion, , Intravenous, STAT, Wandra Arthurs, MD, Last Rate: 100 mL/hr at 11/26/15 6433 .  acetaminophen (TYLENOL) tablet 650 mg, 650 mg, Oral, Q6H PRN **OR** acetaminophen (TYLENOL) suppository 650 mg, 650 mg, Rectal, Q6H PRN, Toy Baker, MD .  albuterol (PROVENTIL) (2.5 MG/3ML) 0.083% nebulizer solution 2.5 mg, 2.5 mg, Nebulization, Q2H PRN, Toy Baker, MD .  DULoxetine (CYMBALTA) DR capsule 60 mg, 60 mg, Oral, Daily, Toy Baker, MD .  enoxaparin (LOVENOX) injection 40 mg, 40 mg, Subcutaneous, QHS, Toy Baker, MD, 40 mg at 11/26/15 0354 .  guaiFENesin (MUCINEX) 12 hr tablet 600 mg, 600 mg, Oral, BID, Toy Baker, MD .  HYDROcodone-acetaminophen (NORCO/VICODIN) 5-325 MG per tablet 1-2 tablet, 1-2 tablet, Oral, Q4H PRN, Toy Baker, MD .  insulin aspart (novoLOG) injection 0-15 Units, 0-15 Units, Subcutaneous, TID WC, Toy Baker, MD .  insulin aspart (  novoLOG) injection 0-5 Units, 0-5 Units, Subcutaneous, QHS, Toy Baker, MD .  methylPREDNISolone sodium succinate (SOLU-MEDROL) 40 mg/mL injection 40 mg, 40 mg, Intravenous, Q8H, Toy Baker, MD, 40 mg at 11/26/15 0504 .  montelukast (SINGULAIR) tablet 10 mg, 10 mg, Oral, QHS, Anastassia Doutova, MD .  ondansetron (ZOFRAN) tablet 4 mg, 4 mg, Oral, Q6H PRN **OR** ondansetron (ZOFRAN) injection 4 mg, 4 mg,  Intravenous, Q6H PRN, Toy Baker, MD .  pantoprazole (PROTONIX) EC tablet 40 mg, 40 mg, Oral, Daily, Toy Baker, MD .  piperacillin-tazobactam (ZOSYN) IVPB 3.375 g, 3.375 g, Intravenous, Q8H, Toy Baker, MD, 3.375 g at 11/26/15 0354 .  polyethylene glycol (MIRALAX / GLYCOLAX) packet 17 g, 17 g, Oral, BID, Toy Baker, MD .  simvastatin (ZOCOR) tablet 20 mg, 20 mg, Oral, q1800, Toy Baker, MD .  sodium chloride flush (NS) 0.9 % injection 3 mL, 3 mL, Intravenous, Q12H, Toy Baker, MD, 3 mL at 11/26/15 0221 .  tiotropium (SPIRIVA) inhalation capsule 18 mcg, 18 mcg, Inhalation, Daily, Toy Baker, MD .  venlafaxine XR (EFFEXOR-XR) 24 hr capsule 150 mg, 150 mg, Oral, Daily, Toy Baker, MD:  . sodium chloride   Intravenous STAT  . DULoxetine  60 mg Oral Daily  . enoxaparin (LOVENOX) injection  40 mg Subcutaneous QHS  . guaiFENesin  600 mg Oral BID  . insulin aspart  0-15 Units Subcutaneous TID WC  . insulin aspart  0-5 Units Subcutaneous QHS  . methylPREDNISolone (SOLU-MEDROL) injection  40 mg Intravenous Q8H  . montelukast  10 mg Oral QHS  . pantoprazole  40 mg Oral Daily  . piperacillin-tazobactam (ZOSYN)  IV  3.375 g Intravenous Q8H  . polyethylene glycol  17 g Oral BID  . simvastatin  20 mg Oral q1800  . sodium chloride flush  3 mL Intravenous Q12H  . tiotropium  18 mcg Inhalation Daily  . venlafaxine XR  150 mg Oral Daily  :  No Known Allergies:  Family History  Problem Relation Age of Onset  . Heart disease Father   . Heart attack Father   . Arthritis Mother   :  Social History   Social History  . Marital Status: Single    Spouse Name: N/A  . Number of Children: 0  . Years of Education: 13   Occupational History  . unemployed    Social History Main Topics  . Smoking status: Former Smoker -- 1.50 packs/day for 35 years    Types: Cigarettes    Quit date: 01/26/2011  . Smokeless tobacco: Never Used  .  Alcohol Use: 1.2 oz/week    2 Standard drinks or equivalent per week     Comment: on weekends  . Drug Use: No  . Sexual Activity: Not on file   Other Topics Concern  . Not on file   Social History Narrative   Patient lives at home with her mother.   Patient is not working   Right handed   Education- One year of college  :  Pertinent items are noted in HPI.  Exam: Patient Vitals for the past 24 hrs:  BP Temp Temp src Pulse Resp SpO2 Height Weight  11/26/15 0630 - - - - - 91 % - -  11/26/15 0614 - - - - - 93 % - -  11/26/15 0600 122/64 mmHg - - 87 20 - - -  11/26/15 0403 124/65 mmHg - - 88 (!) 26 - - -  11/26/15 0209 - 97.1 F (36.2 C) Axillary - -  98 % 5' 4"  (1.626 m) 168 lb 6.9 oz (76.4 kg)  11/26/15 0130 118/72 mmHg - - 99 21 96 % - -  11/26/15 0100 116/64 mmHg - - 97 22 91 % - -  11/26/15 0030 113/68 mmHg - - 96 23 96 % - -  11/26/15 0000 116/65 mmHg - - 94 23 95 % - -  11/25/15 2330 120/73 mmHg - - 98 15 94 % - -  11/25/15 2306 - - - - - 92 % - -  11/25/15 2054 136/69 mmHg - - 97 23 90 % - -  11/25/15 2013 136/69 mmHg - - 95 23 98 % - -  11/25/15 1930 118/59 mmHg - - 95 26 95 % - -  11/25/15 1924 - - - - - 93 % - -  11/25/15 1923 136/73 mmHg - - - - (!) 86 % - -  11/25/15 1921 - 97.6 F (36.4 C) Oral 99 26 (!) 74 % - -  11/25/15 1914 - - - - - 94 % - -   As above    Recent Labs  11/25/15 1936 11/25/15 2011 11/26/15 0254  WBC 12.0*  --  10.1  HGB 12.4 13.6 11.2*  HCT 37.1 40.0 33.2*  PLT 201  --  176    Recent Labs  11/25/15 2040 11/26/15 0254  NA 135 135  K 4.1 4.3  CL 102 105  CO2 18* 18*  GLUCOSE 286* 282*  BUN 16 14  CREATININE 0.86 0.65  CALCIUM 9.2 8.8*    Blood smear review:  None  Pathology: None     Assessment and Plan:  Carol Buchanan is a very charming 55 year old white female. She has metastatic non-small cell lung cancer. She has been on pembrolizumab. By her last PET scan she was responding very nicely.  It is very difficult  to say what this interstitial pneumonitis is. It might represent a toxicity from pembrolizumab. It might represent lymphangitic spread of tumor. It might be infectious.  Ultimately, I think that she may need to have a bronchoscopy and biopsies to see what is going on. I think this would be the most definitive intervention to identify the problem.  I would think that if this was pembrolizumab, that she would have this sooner. However, it is possible that she might still have a delayed reaction. The incidence of pneumonitis with pembrolizumab is less than 5%.  I think we will have to see how she does with the antibiotics and steroid.  We will follow along.  I think everybody down in the ICU for their outstanding and compassionate care.  Pete E.  Romans 10:13

## 2015-11-26 NOTE — Care Management Note (Signed)
Case Management Note  Patient Details  Name: Carol Buchanan MRN: [id number] Date of Birth: 06/24/1961  Subjective/Objective:           resp failure requiring hfnc at 100%         Action/Plan:Date:  Nov 26, 2015 Chart reviewed for concurrent status and case management needs. Will continue to follow patient for changes and needs: Velva Harman, BSN, [place], [place]   Expected Discharge Date:   (unknown)               Expected Discharge Plan:  Home/Self Care  In-House Referral:  NA  Discharge planning Services  CM Consult  Post Acute Care Choice:  NA Choice offered to:  NA  DME Arranged:    DME Agency:     HH Arranged:    [place] Agency:     Status of Service:  Completed, signed off  Medicare Important Message Given:    Date Medicare IM Given:    Medicare IM give by:    Date Additional Medicare IM Given:    Additional Medicare Important Message give by:     If discussed at [place] of Stay Meetings, dates discussed:    Additional Comments:  Leeroy Cha, RN 11/26/2015, 10:44 AM

## 2015-11-26 NOTE — Progress Notes (Addendum)
Pt seen and assessed upon arrival to [facility].  Pt is currently on PRB, HR97, RR19, spo2 94%.  BBS diminished.  No respiratory distress or increased wob noted.  Bipap not indicated at this time.  RN aware at bedside.  RN will clarify bipap rx with MD and notify RT if needed.  RT will continue to monitor and assess pt as needed.

## 2015-11-26 NOTE — Progress Notes (Signed)
CRITICAL VALUE ALERT  Critical value received:  Lactic acid 3.6  Date of notification:  11/26/2015  Time of notification:  0350  Critical value read back:Yes.    Nurse who received alert:  Odis Hollingshead RN   MD notified (1st page):  Triad NP   Time of first page:  0400  MD notified (2nd page):  Time of second page:  Responding MD:  Triad NP Schorr  Time MD responded:  [phone]

## 2015-11-26 NOTE — Progress Notes (Signed)
CRITICAL VALUE ALERT  Critical value received:  Lactic acid 2.4  Date of notification:  11/26/2015  Time of notification:  0610  Critical value read back:Yes.    Nurse who received alert:  Odis Hollingshead RN   MD notified (1st page):  Triad NP   Time of first page:  0620  MD notified (2nd page):  Time of second page:  Responding MD:  Triad NP Schorr  Time MD responded:  [id number]

## 2015-11-26 NOTE — Progress Notes (Addendum)
Inpatient Diabetes Program Recommendations  AACE/ADA: New Consensus Statement on Inpatient Glycemic Control (2015)  Target Ranges:  Prepandial:   less than 140 mg/dL      Peak postprandial:   less than 180 mg/dL (1-2 hours)      Critically ill patients:  140 - 180 mg/dL   Results for BRYCE, KIMBLE (MRN [id number]) as of 11/26/2015 09:31  Ref. Range 11/25/2015 20:11 11/25/2015 20:40 11/26/2015 02:54  Glucose Latest Ref Range: 65-99 mg/dL 274 (H) 286 (H) 282 (H)    Admit with: SOB  History: Lung Cancer, COPD  Current Insulin Orders: Novolog Moderate Correction Scale/ SSI (0-15 units) TID AC + HS    -Note current A1c pending.  -No prior History of DM noted.  Patient has been taking Decadron at home.  -Spoke with pt about new diagnosis.  Explained what an A1C is, basic pathophysiology of DM Type 2, basic home care, basic diabetes diet nutrition principles, importance of checking CBGs and maintaining good CBG control to prevent long-term and short-term complications.  Reviewed blood sugar goals at home.  Also discussed DM diet information with patient.  Encouraged patient to avoid beverages with sugar (regular soda, sweet tea, lemonade, fruit juice) and to consume mostly water.  Discussed what foods contain carbohydrates and how carbohydrates affect the body's blood sugar levels.  Encouraged patient to be careful with her portion sizes (especially grains, starchy vegetables, and fruits).    -Also explained to patient that her A1c results will help the MD determine what kind of medication to send patient home on if any.  Patient very appreciative of my visit and stated to she looked forward to speaking with the dietitian.    -RNs to begin to provide ongoing basic DM education at bedside with this patient.  Have ordered educational booklet, RD consult, CBG meter teaching, and DM videos.      --Will follow patient during hospitalization--  Wyn Quaker RN, MSN, CDE Diabetes  Coordinator Inpatient Glycemic Control Team Team Pager: [phone] (8a-5p)

## 2015-11-26 NOTE — Progress Notes (Signed)
Echocardiogram 2D Echocardiogram has been performed.  Carol Buchanan 11/26/2015, 2:57 PM

## 2015-11-26 NOTE — Consult Note (Signed)
PULMONARY / CRITICAL CARE MEDICINE   Name: Carol Buchanan MRN: [id number] DOB: 05/19/1961    ADMISSION DATE:  11/25/2015 CONSULTATION DATE:  11/26/2015   REFERRING MD:  Dr Shanon Brow Tat  CHIEF COMPLAINT:  Acute resp failure hypoxemic  HISTORY OF PRESENT ILLNESS:   - 55 year old female follows for copd nos with dr Lamonte Sakai but not on home o2 and Dr Marin Olp for metastatic non-small cell lung cancer. Currently on immunotherapy PD-1 antibody keytrudea. She was admitted 11/25/2015. She had worsening shortness of breath with the results of fever and chills with a temperature of 102 Fahrenheit at home. She was noticed to be hypoxemic at home and then admitted. She did not respond to outpatient cephalosporin. In the hospital CT angiogram ruled out pulmonary embolism but showed worsening diffuse groundglass opacities  Off note she has finished 4 cycles of ketyrida. PET scan 11/04/2015  showed a positive response with decrease in the left upper lobe mass according to the oncology notes. She also had resolution of lower lobe atelectasis but it did show groundglass opacities . This was new compared to CT scan at the time of diagnosis of cancer in end of 2016. It appears that she also had a CT angiogram chest 3 days after the PET scan on 11/07/2015 that continued to show groundglass opacities. Currently 11/25/2015 the ground glass opacities are worse.   Patient is now on 10 L nasal cannula and this is a new hypoxemia for her    PAST MEDICAL HISTORY :  She  has a past medical history of Hypertension; Hyperlipidemia; Sinus trouble; Neuropathy ([place]); COPD (chronic obstructive pulmonary disease) ([place]); Primary lung cancer with metastasis from lung to other site [facility]) (07/26/2015); Radiation (08/06/15-08/23/15); and DM (diabetes mellitus), type 2, uncontrolled ([place]) (11/26/2015).  PAST SURGICAL HISTORY: She  has past surgical history that includes Tubal ligation (1994); Back surgery (2015); and Video bronchoscopy  (Bilateral, 07/01/2015).  No Known Allergies  No current facility-administered medications on file prior to encounter.   Current Outpatient Prescriptions on File Prior to Encounter  Medication Sig  . Ascorbic Acid (VITAMIN C) 1000 MG tablet Take 1,000 mg by mouth daily.  . Calcium Carbonate-Vit D-Min 1200-1000 MG-UNIT CHEW Chew 1 tablet by mouth daily.  . cefdinir (OMNICEF) 300 MG capsule Take 2 capsules (600 mg total) by mouth daily. For 7 days  . dexamethasone (DECADRON) 4 MG tablet Take 1 tablet (4 mg total) by mouth 2 (two) times daily with a meal. (Patient taking differently: Take 4 mg by mouth daily. )  . DULoxetine (CYMBALTA) 60 MG capsule Take 60 mg by mouth daily. Reported on 08/06/2015  . fluticasone (FLONASE) 50 MCG/ACT nasal spray Place 2 sprays into both nostrils daily.  Marland Kitchen ibuprofen (ADVIL,MOTRIN) 800 MG tablet Take 800 mg by mouth every 8 (eight) hours as needed for moderate pain.   Marland Kitchen LORazepam (ATIVAN) 0.5 MG tablet Take 1 tablet (0.5 mg total) by mouth every 6 (six) hours as needed (Nausea or vomiting). (Patient taking differently: Take 0.5 mg by mouth at bedtime. )  . meloxicam (MOBIC) 15 MG tablet Take 15 mg by mouth daily.  . montelukast (SINGULAIR) 10 MG tablet Take 1 tablet (10 mg total) by mouth at bedtime.  . Multiple Vitamins-Minerals (CENTRUM SILVER PO) Take 1 tablet by mouth daily.   Marland Kitchen omeprazole (PRILOSEC) 20 MG capsule Take 20 mg by mouth daily.  Marland Kitchen PREMPRO 0.625-2.5 MG tablet Take 1 tablet by mouth daily.   Marland Kitchen PROAIR HFA 108 (90 BASE)  MCG/ACT inhaler inhale 1 puff every 4 hours if needed for shortness of breath  . prochlorperazine (COMPAZINE) 10 MG tablet Take 1 tablet (10 mg total) by mouth every 6 (six) hours as needed (Nausea or vomiting).  . simvastatin (ZOCOR) 20 MG tablet Take 20 mg by mouth daily.   . Tiotropium Bromide-Olodaterol (STIOLTO RESPIMAT) 2.5-2.5 MCG/ACT AERS Inhale 2 puffs into the lungs daily.  Marland Kitchen venlafaxine XR (EFFEXOR-XR) 150 MG 24 hr capsule  Take 150 mg by mouth daily. Reported on 08/06/2015  . verapamil (COVERA HS) 180 MG (CO) 24 hr tablet Take 180 mg by mouth daily.   . vitamin E 400 UNIT capsule Take 400 Units by mouth daily.  . ondansetron (ZOFRAN) 4 MG tablet Take 1 tablet (4 mg total) by mouth once. (Patient not taking: Reported on 11/13/2015)  . polyethylene glycol powder (MIRALAX) powder Take 17 g by mouth 2 (two) times daily.    FAMILY HISTORY:  Her indicated that her mother is alive. She indicated that her father is deceased.   SOCIAL HISTORY: She  reports that she quit smoking about 4 years ago. Her smoking use included Cigarettes. She has a 52.5 pack-year smoking history. She has never used smokeless tobacco. She reports that she drinks about 1.2 oz of alcohol per week. She reports that she does not use illicit drugs.  REVIEW OF SYSTEMS:   Because of oxygen need detailed 11 point review of systems is not fully elicitable. It is as stated in the history of present illness otherwise assumed is negative.  VITAL SIGNS: BP 142/81 mmHg  Pulse 99  Temp(Src) 97.7 F (36.5 C) (Oral)  Resp 20  Ht '5\' 4"'$  (1.626 m)  Wt 76.4 kg (168 lb 6.9 oz)  BMI 28.90 kg/m2  SpO2 91%  HEMODYNAMICS:    VENTILATOR SETTINGS: Vent Mode:  [-]  FiO2 (%):  [100 %] 100 %  INTAKE / OUTPUT: I/O last 3 completed shifts: In: 1739.4 [P.O.:400; I.V.:88.3; Other:1000; IV Piggyback:251] Out: 400 [Urine:400]  PHYSICAL EXAMINATION: Generalobese lady. Looks chronically unwell but fairly okay Neuro: Alert and oriented 3. Speech normal. Moves all 4 extremities.  HEENT:  Nasal cannula on. No elevated JVP. No neck nodes Cardiovascular:  Regular rate and rhythm. No murmurs  Lungs:  clear to auscultation bilaterally anteriorly but basal crackles present. No increased work of breathing and liters oxygen on  Abdomen:  obese soft Musculoskeletal:  Trace edema present both Skin exam: Intact in the exposed areas  ABS:  BMET  Recent Labs Lab  11/25/15 2011 11/25/15 2040 11/26/15 0254  NA 131* 135 135  K 6.2* 4.1 4.3  CL 101 102 105  CO2  --  18* 18*  BUN 23* 16 14  CREATININE 0.60 0.86 0.65  GLUCOSE 274* 286* 282*    Electrolytes  Recent Labs Lab 11/25/15 2040 11/26/15 0254  CALCIUM 9.2 8.8*  MG  --  2.1  PHOS  --  3.1    CBC  Recent Labs Lab 11/25/15 1936 11/25/15 2011 11/26/15 0254  WBC 12.0*  --  10.1  HGB 12.4 13.6 11.2*  HCT 37.1 40.0 33.2*  PLT 201  --  176    Coag's No results for input(s): APTT, INR in the last 168 hours.  Sepsis Markers  Recent Labs Lab 11/25/15 2242 11/26/15 0254 11/26/15 0256 11/26/15 0515  LATICACIDVEN 4.3*  --  3.6* 2.4*  PROCALCITON  --  <0.10  --   --     ABG  Recent Labs Lab  11/25/15 2143  PHART 7.400  PCO2ART 27.0*  PO2ART 63.3*    Liver Enzymes  Recent Labs Lab 11/25/15 2040 11/26/15 0254  AST 40 37  ALT 31 27  ALKPHOS 83 74  BILITOT 1.1 0.6  ALBUMIN 3.5 3.2*    Cardiac Enzymes No results for input(s): TROPONINI, PROBNP in the last 168 hours.  Glucose  Recent Labs Lab 11/26/15 0728  GLUCAP 160*    Imaging Ct Angio Chest Pe W/cm &/or Wo Cm  11/25/2015  CLINICAL DATA:  Stage IV lung cancer, now with shortness of breath. Evaluate pulmonary embolism. EXAM: CT ANGIOGRAPHY CHEST WITH CONTRAST TECHNIQUE: Multidetector CT imaging of the chest was performed using the standard protocol during bolus administration of intravenous contrast. Multiplanar CT image reconstructions and MIPs were obtained to evaluate the vascular anatomy. CONTRAST:  100 cc Isovue 370 COMPARISON:  Chest CT - 11/07/2015; 06/21/2015 PET-CT - 11/04/2015 FINDINGS: Vascular Findings: There is adequate opacification of the pulmonary arterial system with the main pulmonary artery measuring 236 Hounsfield units. No discrete filling defects are seen within the pulmonary arterial tree to suggest pulmonary embolism. There is unchanged malignant narrowing involving the left upper  lobe pulmonary artery secondary to known left perihilar mass. Normal caliber of the main pulmonary artery. Cardiomegaly. No pericardial effusion. Mixed calcified and noncalcified atherosclerotic plaque with a normal caliber thoracic aorta. No definite thoracic aortic dissection on this nongated examination. Note is again made of an apparent right subclavian artery. The branch vessels of the aortic arch widely patent throughout their imaged course. Review of the MIP images confirms the above findings. ---------------------------------------------------------------------------------- Nonvascular Findings: Mediastinum/Lymph Nodes: Scattered mediastinal lymph nodes are numerous though individually not enlarged by size criteria with index precarinal lymph node measuring 0.7 cm in greatest short axis diameter (image 30, series 4) and index left infrahilar lymph node measuring 0.9 cm (image 37, series 4). No axillary lymphadenopathy Lungs/Pleura: Known slightly spiculated nodule/mass within in the medial aspect the left upper lobe is grossly unchanged, measuring approximately 3.0 x 1.7 cm in diameter (image 32, series 7) though note, exact measurements are difficult secondary to the irregular shape of this nodule/mass. Worsening interstitial thickening throughout the remainder of the lungs with relative sparing of the left upper lobe. Worsening ill-defined areas of ground-glass within the bilateral lung bases. Air bronchograms are seen within the superior segment of the lingula. The central pulmonary airways are patent. Upper abdomen: Limited early arterial phase evaluation of the upper abdomen is normal. Musculoskeletal: Known ill-defined lytic lesion involving the T4 vertebral body appears unchanged (sagittal image 82, series 9). Increased sclerosis involving the T5, T6 and T7 vertebral bodies is grossly unchanged. Regional soft tissues appear normal. Note is made of a peripherally calcified approximately 1.3 cm nodule  within the caudal aspect the right lobe of the thyroid (image 11, series 4). IMPRESSION: 1. No evidence of pulmonary embolism. 2. Known nodule/mass with the medial aspect of the left upper lobe is grossly unchanged and compatible with provided history of lung cancer. 3. Worsening rather extensive interstitial opacities throughout the lungs with conspicuous sparing of the left upper lobe, progressed compared to PET-CT performed 11/04/2015 as well as more recently performed chest CT performed 11/07/2015 - findings are not specific with differential considerations include asymmetric pulmonary edema, atypical infection, post treatment pneumonitis, though note, lymphangitic spread of tumor could have a similar appearance. 4. Unchanged appearance of known lytic lesion involving the T4 vertebral body without associated collapse. Electronically Signed   By: Sandi Mariscal  M.D.   On: 11/25/2015 21:52   Dg Chest Port 1 View  11/25/2015  CLINICAL DATA:  Chest pain and shortness of breath. EXAM: PORTABLE CHEST 1 VIEW COMPARISON:  Chest radiographs dated 06/21/2015, chest CTA dated 11/07/2015 and PET-CT dated 11/04/2015. The FINDINGS: Interval patchy opacity in the lateral aspect of the left lower lung zone, obscuring the left heart border. Diffusely prominent interstitial markings with improvement compared to the most recent CT. The left upper lobe mass is poorly visualized. No visible pleural fluid. Unremarkable bones. IMPRESSION: 1. Interval left lateral lower lung airspace opacity suspicious for pneumonia or postobstructive changes. 2. Diffuse interstitial lung disease, increased since 06/21/2015 and decreased since 11/07/2015. This could represent a combination of chronic interstitial lung disease, interstitial pneumonitis and/or lymphangitic spread of tumor. Electronically Signed   By: Claudie Revering M.D.   On: 11/25/2015 19:57    Anti-infectives    Start     Dose/Rate Route Frequency Ordered Stop   11/26/15 0400   piperacillin-tazobactam (ZOSYN) IVPB 3.375 g  Status:  Discontinued     3.375 g 100 mL/hr over 30 Minutes Intravenous Every 8 hours 11/26/15 0203 11/26/15 0213   11/26/15 0400  piperacillin-tazobactam (ZOSYN) IVPB 3.375 g     3.375 g 12.5 mL/hr over 240 Minutes Intravenous Every 8 hours 11/26/15 0213     11/25/15 2015  vancomycin (VANCOCIN) IVPB 1000 mg/200 mL premix     1,000 mg 200 mL/hr over 60 Minutes Intravenous  Once 11/25/15 2001 11/25/15 2137   11/25/15 2015  piperacillin-tazobactam (ZOSYN) IVPB 3.375 g     3.375 g 100 mL/hr over 30 Minutes Intravenous  Once 11/25/15 2001 11/25/15 2051       CODE    Code Status Orders        Start     Ordered   11/26/15 0204  Do not attempt resuscitation (DNR)   Continuous    Question Answer Comment  In the event of cardiac or respiratory ARREST Do not call a "code blue"   In the event of cardiac or respiratory ARREST Do not perform Intubation, CPR, defibrillation or ACLS   In the event of cardiac or respiratory ARREST Use medication by any route, position, wound care, and other measures to relive pain and suffering. May use oxygen, suction and manual treatment of airway obstruction as needed for comfort.      11/26/15 0203    Code Status History    Date Active Date Inactive Code Status Order ID Comments User Context   This patient has a current code status but no historical code status.       ASSESSMENT / PLAN:  PULMONARY A: Acute hypoxemic respiratory failure with infiltrates -   - Differential diagnosis is pulmonary toxicity from PD-1 antibody (most likely) v lymphangitis carcinomatosis baseline problems  - Current worsening could be due to above getting worse versus superimposed viral/HCAP. Alternatively diastolic heart failure  P:   Diuresis/get echo   high-dose steroids Broad antibiotics Consider BiPAP if she gets worse; no intubation  If above does not help then consider surgical lung biopsy she is willing to  it    The patient is critically ill with multiple organ systems failure and requires high complexity decision making for assessment and support, frequent evaluation and titration of therapies, application of advanced monitoring technologies and extensive interpretation of multiple databases.   Critical Care Time devoted to patient care services described in this note is  30  Minutes. This time reflects time  of care of this signee Dr Brand Males. This critical care time does not reflect procedure time, or teaching time or supervisory time of PA/NP/Med student/Med Resident etc but could involve care discussion time    Dr. Brand Males, M.D., [facility].C.P Pulmonary and Critical Care Medicine Staff Physician [place] [facility] Pager: [phone], If no answer or between  15:00h - 7:00h: call [phone]  11/26/2015 10:35 AM

## 2015-11-26 NOTE — Progress Notes (Signed)
Discussed "Living Well with DM" booklet with patient.  Pt said she could not read the material at this time due to not having her glasses.  Her mother was bringing them later this afternoon.  The RN encouraged her to read the material about the diet management when she had her glasses.  Pt verbalized understanding.  Discussed her watching the DM videos and trying to do her own CBG on herself.  Pt said she didn't want to do those things at this time.  RN said she would discuss these things later.  No other concerns at this time.  Will continue to monitor.  Iantha Fallen RN [phone] 11/26/2015

## 2015-11-26 NOTE — Progress Notes (Signed)
PROGRESS NOTE  Carol Buchanan ZJQ:[id number] DOB: 1960/08/04 DOA: 11/25/2015 PCP: Andria Frames, MD Outpatient Specialists:  [facility]--Neurology  Brief History:  55 year old female with a history of COPD, metastatic non-small cell lung cancer on radiation and chemotherapy started February 2017 (last chemo tx 11/13/15), presented with one-week history of worsening shortness of breath and constipation. The patient also complained of fevers and chills with a temperature up to 102.83F at home. The patient had her oxygen saturation checked at home and it was noted to be in the 70s. As result, EMS was activated. The patient contacted her medical oncologist on 11/25/2015 and prescribed cefdinir.  She did not have improvement.  Upon presentation, she was noted to have oxygen saturation in the 70s. She was placed on BiPAP. CT and grandma the chest was negative for pulmonary embolus but showed worsening interstitial thickening in the left upper lobe as well as groundglass opacities in the left lower lobe and right lower lobe with air bronchograms in the lingular area. There was also increased sclerosis T5-7. The patient was started on intravenous Solu-Medrol and antibiotics.  Assessment/Plan: Acute respiratory failure with hypoxia -Suspect a viral respiratory infection/PNA superimposed upon COPD exacerbation  -Certainly, the patient may have radiation pneumonitis or lung toxicity from pembrolizumab -Continue Zosyn -Check viral respiratory panel -Check procalcitonin <0.10 -Continue bronchodilators -Wean BiPAP as tolerated-->now stable on HFNC  COPD exacerbation -Wean steroids to po  -Continue antibiotics  Hyperglycemia -Check hemoglobin A1c -CBGs elevated in part due to steroids -Continue NovoLog sliding scale -no prior diagnosis of DM  Metastatic non-small cell lung cancer  -Appreciate Dr. Marin Olp follow up -last pembrolizumab on  4/19 -Radiation treatment dates: 08/06/2015-08/23/2015 -mets to ribs and vertebra  Hypertension -Blood pressure controlled off of verapamil -Monitor clinically  Hyperlipidemia -Continue Zocor  Depression/anxiety -continue Cymbalta, Effexor and Ativan    Disposition Plan:   Home in 2-3 days  Family Communication:   No Family at beside  Consultants:  Med Onc, PCCM  Code Status:  DNR  Antibiotics:  Zosyn 5/1>>>  Subjective: Pt states breathing not much better.  No cp, n/v/d.  C/o nonproductive cough without hemoptysis.  No cp, HA, neck pain, rashes  Objective: Filed Vitals:   11/26/15 0403 11/26/15 0600 11/26/15 0614 11/26/15 0630  BP: 124/65 122/64    Pulse: 88 87    Temp:      TempSrc:      Resp: 26 20    Height:      Weight:      SpO2:   93% 91%    Intake/Output Summary (Last 24 hours) at 11/26/15 2409 Last data filed at 11/26/15 0809  Gross per 24 hour  Intake 1739.37 ml  Output    700 ml  Net 1039.37 ml   Weight change:  Exam:   General:  Pt is alert, follows commands appropriately, not in acute distress  HEENT: No icterus, No thrush, No neck mass, [place]/AT  Cardiovascular: RRR, S1/S2, no rubs, no gallops  Respiratory: bilateral crackles L>R, no wheeze  Abdomen: Soft/+BS, non tender, non distended, no guarding; no hepatosplenomegaly  Extremities: No edema, No lymphangitis, No petechiae, No rashes, no synovitis   Data Reviewed: I have personally reviewed following labs and imaging studies Basic Metabolic Panel:  Recent Labs Lab 11/25/15 2011 11/25/15 2040 11/26/15 0254  NA 131* 135 135  K 6.2* 4.1 4.3  CL 101 102 105  CO2  --  18* 18*  GLUCOSE 274* 286* 282*  BUN 23* 16 14  CREATININE 0.60 0.86 0.65  CALCIUM  --  9.2 8.8*  MG  --   --  2.1  PHOS  --   --  3.1   Liver Function Tests:  Recent Labs Lab 11/25/15 2040 11/26/15 0254  AST 40 37  ALT 31 27  ALKPHOS 83 74  BILITOT 1.1 0.6  PROT 7.3 6.8  ALBUMIN 3.5 3.2*   No  results for input(s): LIPASE, AMYLASE in the last 168 hours. No results for input(s): AMMONIA in the last 168 hours. Coagulation Profile: No results for input(s): INR, PROTIME in the last 168 hours. CBC:  Recent Labs Lab 11/25/15 1936 11/25/15 2011 11/26/15 0254  WBC 12.0*  --  10.1  NEUTROABS 11.4*  --   --   HGB 12.4 13.6 11.2*  HCT 37.1 40.0 33.2*  MCV 96.1  --  96.5  PLT 201  --  176   Cardiac Enzymes: No results for input(s): CKTOTAL, CKMB, CKMBINDEX, TROPONINI in the last 168 hours. BNP: Invalid input(s): POCBNP CBG: No results for input(s): GLUCAP in the last 168 hours. HbA1C: No results for input(s): HGBA1C in the last 72 hours. Urine analysis:    Component Value Date/Time   COLORURINE YELLOW 11/26/2015 0203   APPEARANCEUR CLEAR 11/26/2015 0203   LABSPEC >1.046* 11/26/2015 0203   PHURINE 5.0 11/26/2015 0203   GLUCOSEU >1000* 11/26/2015 0203   HGBUR NEGATIVE 11/26/2015 0203   BILIRUBINUR NEGATIVE 11/26/2015 0203   KETONESUR NEGATIVE 11/26/2015 0203   PROTEINUR 30* 11/26/2015 0203   NITRITE NEGATIVE 11/26/2015 0203   LEUKOCYTESUR NEGATIVE 11/26/2015 0203   Sepsis Labs: '@LABRCNTIP'$ (procalcitonin:4,lacticidven:4) ) Recent Results (from the past 240 hour(s))  MRSA PCR Screening     Status: None   Collection Time: 11/26/15  1:58 AM  Result Value Ref Range Status   MRSA by PCR NEGATIVE NEGATIVE Final    Comment:        The GeneXpert MRSA Assay (FDA approved for NASAL specimens only), is one component of a comprehensive MRSA colonization surveillance program. It is not intended to diagnose MRSA infection nor to guide or monitor treatment for MRSA infections.      Scheduled Meds: . sodium chloride   Intravenous STAT  . DULoxetine  60 mg Oral Daily  . enoxaparin (LOVENOX) injection  40 mg Subcutaneous QHS  . guaiFENesin  600 mg Oral BID  . insulin aspart  0-15 Units Subcutaneous TID WC  . insulin aspart  0-5 Units Subcutaneous QHS  .  methylPREDNISolone (SOLU-MEDROL) injection  60 mg Intravenous Q6H  . montelukast  10 mg Oral QHS  . pantoprazole  40 mg Oral Daily  . piperacillin-tazobactam (ZOSYN)  IV  3.375 g Intravenous Q8H  . polyethylene glycol  17 g Oral BID  . simvastatin  20 mg Oral q1800  . sodium chloride flush  3 mL Intravenous Q12H  . tiotropium  18 mcg Inhalation Daily  . venlafaxine XR  150 mg Oral Daily   Continuous Infusions:   Procedures/Studies: Ct Angio Chest Pe W/cm &/or Wo Cm  11/25/2015  CLINICAL DATA:  Stage IV lung cancer, now with shortness of breath. Evaluate pulmonary embolism. EXAM: CT ANGIOGRAPHY CHEST WITH CONTRAST TECHNIQUE: Multidetector CT imaging of the chest was performed using the standard protocol during bolus administration of intravenous contrast. Multiplanar CT image reconstructions and MIPs were obtained to evaluate the vascular anatomy. CONTRAST:  100 cc Isovue 370 COMPARISON:  Chest CT -  11/07/2015; 06/21/2015 PET-CT - 11/04/2015 FINDINGS: Vascular Findings: There is adequate opacification of the pulmonary arterial system with the main pulmonary artery measuring 236 Hounsfield units. No discrete filling defects are seen within the pulmonary arterial tree to suggest pulmonary embolism. There is unchanged malignant narrowing involving the left upper lobe pulmonary artery secondary to known left perihilar mass. Normal caliber of the main pulmonary artery. Cardiomegaly. No pericardial effusion. Mixed calcified and noncalcified atherosclerotic plaque with a normal caliber thoracic aorta. No definite thoracic aortic dissection on this nongated examination. Note is again made of an apparent right subclavian artery. The branch vessels of the aortic arch widely patent throughout their imaged course. Review of the MIP images confirms the above findings. ---------------------------------------------------------------------------------- Nonvascular Findings: Mediastinum/Lymph Nodes: Scattered mediastinal  lymph nodes are numerous though individually not enlarged by size criteria with index precarinal lymph node measuring 0.7 cm in greatest short axis diameter (image 30, series 4) and index left infrahilar lymph node measuring 0.9 cm (image 37, series 4). No axillary lymphadenopathy Lungs/Pleura: Known slightly spiculated nodule/mass within in the medial aspect the left upper lobe is grossly unchanged, measuring approximately 3.0 x 1.7 cm in diameter (image 32, series 7) though note, exact measurements are difficult secondary to the irregular shape of this nodule/mass. Worsening interstitial thickening throughout the remainder of the lungs with relative sparing of the left upper lobe. Worsening ill-defined areas of ground-glass within the bilateral lung bases. Air bronchograms are seen within the superior segment of the lingula. The central pulmonary airways are patent. Upper abdomen: Limited early arterial phase evaluation of the upper abdomen is normal. Musculoskeletal: Known ill-defined lytic lesion involving the T4 vertebral body appears unchanged (sagittal image 82, series 9). Increased sclerosis involving the T5, T6 and T7 vertebral bodies is grossly unchanged. Regional soft tissues appear normal. Note is made of a peripherally calcified approximately 1.3 cm nodule within the caudal aspect the right lobe of the thyroid (image 11, series 4). IMPRESSION: 1. No evidence of pulmonary embolism. 2. Known nodule/mass with the medial aspect of the left upper lobe is grossly unchanged and compatible with provided history of lung cancer. 3. Worsening rather extensive interstitial opacities throughout the lungs with conspicuous sparing of the left upper lobe, progressed compared to PET-CT performed 11/04/2015 as well as more recently performed chest CT performed 11/07/2015 - findings are not specific with differential considerations include asymmetric pulmonary edema, atypical infection, post treatment pneumonitis, though  note, lymphangitic spread of tumor could have a similar appearance. 4. Unchanged appearance of known lytic lesion involving the T4 vertebral body without associated collapse. Electronically Signed   By: Sandi Mariscal M.D.   On: 11/25/2015 21:52   Ct Angio Chest Pe W/cm &/or Wo Cm  11/07/2015  CLINICAL DATA:  Worsening shortness of breath, tachycardia, history of left lung cancer EXAM: CT ANGIOGRAPHY CHEST WITH CONTRAST TECHNIQUE: Multidetector CT imaging of the chest was performed using the standard protocol during bolus administration of intravenous contrast. Multiplanar CT image reconstructions and MIPs were obtained to evaluate the vascular anatomy. CONTRAST:  100 cc Isovue COMPARISON:  11/04/2015 FINDINGS: Images of the thoracic inlet shows stable peripheral calcified nodule in right lobe of thyroid gland measures 1.2 cm. The study is of excellent technical quality. No pulmonary embolus is noted. Again noted left upper lobe perihilar nodule stable from prior exam. Mild peripheral postobstructive consolidation in left upper lobe anteromedially is stable. Again noted left upper lobe and bilateral lower lobe multifocal ground-glass parenchymal attenuation and interstitial septal thickening consistent  with postradiation pneumonitis. There is slight worsening from prior exam. Mild superimposed interstitial edema cannot be excluded. Stable mediastinal and hilar adenopathy. Central thoracic aorta is unremarkable. Heart size within normal limits. Mild thickening of distal esophageal wall suspicious for gastroesophageal reflux disease. The visualized upper abdomen shows no adrenal gland mass. Stable mild sclerotic lesion T4 vertebral body. Degenerative changes mid thoracic spine again noted. Review of the MIP images confirms the above findings. IMPRESSION: 1. There is no evidence of pulmonary embolus. 2. Stable left upper lobe suprahilar nodule/residual mass without significant change from prior exam. Measures about 1.8  cm. Mild postobstructive changes in left upper lobe anteromedial are stable. 3. Stable mediastinal and hilar adenopathy. 4. Again noted right upper lobe and bilateral lower lobe diffuse ground-glass parenchymal attenuation and septal interstitial thickening. There is slight worsening from prior exam. 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 Mild superimposed interstitial edema cannot be excluded. 5. Stable partially sclerotic lesion in T4 vertebral body. Electronically Signed   By: Lahoma Crocker M.D.   On: 11/07/2015 15:04   Nm Pet Image Restag (ps) Skull Base To Thigh  11/04/2015  CLINICAL DATA:  Subsequent Treatment strategy for lung cancer. Restaging examination. EXAM: NUCLEAR MEDICINE PET SKULL BASE TO THIGH TECHNIQUE: 8.43 mCi F-18 FDG was injected intravenously. Full-ring PET imaging was performed from the skull base to thigh after the radiotracer. CT data was obtained and used for attenuation correction and anatomic localization. FASTING BLOOD GLUCOSE:  Value: 111 mg/dl COMPARISON:  PET-CT 07/02/2015 FINDINGS: NECK No hypermetabolic lymph nodes in the neck. CHEST Previously noted complete collapse/ consolidation of the left upper lobe has resolved. In the central aspect of the left upper lobe there is a perihilar nodule which measures approximately 10 x 19 mm (image 62 of series 9) and demonstrates mild hypermetabolism (SUVmax = 4.7). Throughout the lungs bilaterally there is new multifocal ground-glass attenuation and septal thickening, most evident in the left lower lobe, diffusely hypermetabolic, most compatible with postradiation pneumonitis. No other definite new pulmonary nodules are noted. There is a background of mild centrilobular emphysema. No pleural effusions. Heart size is normal. There is no significant pericardial fluid, thickening or pericardial calcification. There is atherosclerosis of the thoracic aorta, the great vessels of the mediastinum and the coronary arteries,  including calcified atherosclerotic plaque in the left main, left anterior descending, left circumflex and right coronary arteries. The hypermetabolism associated with the left parahilar nodule is inseparable from some low level hypermetabolism in the left hilum. No other hypermetabolic mediastinal or right hilar adenopathy is noted. 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 Small amount of low-level hypermetabolism (SUVmax = 4.4) associated with the distal esophagus immediately above the gastroesophageal junction, favored to be physiologic. ABDOMEN/PELVIS No abnormal hypermetabolic activity within the liver, pancreas, adrenal glands, or spleen. No hypermetabolic lymph nodes in the abdomen or pelvis. Diffuse low attenuation throughout the hepatic parenchyma, compatible with hepatic steatosis. Extensive atherosclerosis throughout the abdominal and pelvic vasculature, without evidence of in addition, aneurysm. Normal appendix. SKELETON Several hypermetabolic areas are again noted throughout the axial and appendicular skeleton, most notably, in the left side of the T4 vertebral body there is a 9 x 18 mm lytic lesion (image 16 of series 7) which demonstrates low-level hypermetabolism (SUVmax = 4.7), significantly decreased compared to the prior study in both size and hypermetabolism. Another lesion is within the proximal right femoral diaphysis (SUVmax = 4.9), but demonstrates no corresponding abnormality on the CT portion of the examination. Several  other smaller areas of hypermetabolism are seen throughout the thoracic and lumbar spine, with no corresponding osseous abnormality on the CT portion of the examination. In addition, there is a healing fracture of the left sixth rib laterally which is nondisplaced. IMPRESSION: 1. Today's study demonstrates a positive response to therapy with significant regression of the previously noted left upper lobe mass, which currently is a 19 x  10 mm suprahilar nodule in the left upper lobe. Left upper lobe postobstructive consolidation/atelectasis seen on the prior study has also resolved. Ground-glass attenuation and hypermetabolism throughout the lungs bilaterally (most severe in the left lower lobe) is most compatible with evolving postradiation pneumonitis. 2. 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 There are multiple other small areas of hypermetabolism throughout the skeleton, which are nonspecific and demonstrate no corresponding CT abnormality. Attention on followup studies is recommended to exclude the possibility of additional metastatic lesions. 3. No other extra skeletal metastatic disease noted in the abdomen or pelvis. 4. Hepatic steatosis. 5. Atherosclerosis, including left main and 3 vessel coronary artery disease. Please note that although the presence of coronary artery calcium documents the presence of coronary artery disease, the severity of this disease and any potential stenosis cannot be assessed on this non-gated CT examination. Assessment for potential risk factor modification, dietary therapy or pharmacologic therapy may be warranted, if clinically indicated. Electronically Signed   By: Vinnie Langton M.D.   On: 11/04/2015 14:20   Dg Chest Port 1 View  11/25/2015  CLINICAL DATA:  Chest pain and shortness of breath. EXAM: PORTABLE CHEST 1 VIEW COMPARISON:  Chest radiographs dated 06/21/2015, chest CTA dated 11/07/2015 and PET-CT dated 11/04/2015. The FINDINGS: Interval patchy opacity in the lateral aspect of the left lower lung zone, obscuring the left heart border. Diffusely prominent interstitial markings with improvement compared to the most recent CT. The left upper lobe mass is poorly visualized. No visible pleural fluid. Unremarkable bones. IMPRESSION: 1. Interval left lateral lower lung airspace  opacity suspicious for pneumonia or postobstructive changes. 2. Diffuse interstitial lung disease, increased since 06/21/2015 and decreased since 11/07/2015. This could represent a combination of chronic interstitial lung disease, interstitial pneumonitis and/or lymphangitic spread of tumor. Electronically Signed   By: Claudie Revering M.D.   On: 11/25/2015 19:57    Khalil Szczepanik, DO  [facility] Pager [phone]  If 7PM-7AM, please contact night-coverage [email or web address] Password [email or web address] 11/26/2015, 8:32 AM   LOS: 1 day

## 2015-11-27 DIAGNOSIS — I10 Essential (primary) hypertension: Secondary | ICD-10-CM

## 2015-11-27 DIAGNOSIS — C3412 Malignant neoplasm of upper lobe, left bronchus or lung: Secondary | ICD-10-CM

## 2015-11-27 LAB — BASIC METABOLIC PANEL
Anion gap: 12 (ref 5–15)
BUN: 21 mg/dL — AB (ref 6–20)
CO2: 22 mmol/L (ref 22–32)
Calcium: 8.2 mg/dL — ABNORMAL LOW (ref 8.9–10.3)
Chloride: 103 mmol/L (ref 101–111)
Creatinine, Ser: 0.83 mg/dL (ref 0.44–1.00)
Glucose, Bld: 187 mg/dL — ABNORMAL HIGH (ref 65–99)
POTASSIUM: 3.7 mmol/L (ref 3.5–5.1)
SODIUM: 137 mmol/L (ref 135–145)

## 2015-11-27 LAB — GLUCOSE, CAPILLARY
GLUCOSE-CAPILLARY: 121 mg/dL — AB (ref 65–99)
GLUCOSE-CAPILLARY: 112 mg/dL — AB (ref 65–99)
GLUCOSE-CAPILLARY: 203 mg/dL — AB (ref 65–99)
Glucose-Capillary: 214 mg/dL — ABNORMAL HIGH (ref 65–99)

## 2015-11-27 LAB — RESPIRATORY VIRUS PANEL
ADENOVIRUS: NEGATIVE
Influenza A: NEGATIVE
Influenza B: NEGATIVE
Metapneumovirus: NEGATIVE
PARAINFLUENZA 2 A: NEGATIVE
Parainfluenza 1: NEGATIVE
Parainfluenza 3: NEGATIVE
RESPIRATORY SYNCYTIAL VIRUS A: NEGATIVE
RESPIRATORY SYNCYTIAL VIRUS B: NEGATIVE
RHINOVIRUS: NEGATIVE

## 2015-11-27 LAB — CBC
HEMATOCRIT: 31.9 % — AB (ref 36.0–46.0)
Hemoglobin: 10.5 g/dL — ABNORMAL LOW (ref 12.0–15.0)
MCH: 32.4 pg (ref 26.0–34.0)
MCHC: 32.9 g/dL (ref 30.0–36.0)
MCV: 98.5 fL (ref 78.0–100.0)
PLATELETS: 171 10*3/uL (ref 150–400)
RBC: 3.24 MIL/uL — ABNORMAL LOW (ref 3.87–5.11)
RDW: 15.2 % (ref 11.5–15.5)
WBC: 13.6 10*3/uL — AB (ref 4.0–10.5)

## 2015-11-27 LAB — LACTIC ACID, PLASMA
Lactic Acid, Venous: 2.9 mmol/L (ref 0.5–2.0)
Lactic Acid, Venous: 2.4 mmol/L (ref 0.5–2.0)

## 2015-11-27 LAB — HEMOGLOBIN A1C
Hgb A1c MFr Bld: 6.7 % — ABNORMAL HIGH (ref 4.8–5.6)
Mean Plasma Glucose: 146 mg/dL

## 2015-11-27 LAB — PROCALCITONIN

## 2015-11-27 MED ORDER — FUROSEMIDE 10 MG/ML IJ SOLN
40.0000 mg | Freq: Three times a day (TID) | INTRAMUSCULAR | Status: DC
  Administered 2015-11-27 – 2015-11-29 (×6): 40 mg via INTRAVENOUS
  Filled 2015-11-27 (×6): qty 4

## 2015-11-27 MED ORDER — FLUTICASONE PROPIONATE 50 MCG/ACT NA SUSP
2.0000 | Freq: Every day | NASAL | Status: DC
  Administered 2015-11-27 – 2015-12-02 (×6): 2 via NASAL
  Filled 2015-11-27: qty 16

## 2015-11-27 MED ORDER — METHYLPREDNISOLONE SODIUM SUCC 125 MG IJ SOLR
60.0000 mg | Freq: Four times a day (QID) | INTRAMUSCULAR | Status: DC
  Administered 2015-11-27 – 2015-11-28 (×5): 60 mg via INTRAVENOUS
  Filled 2015-11-27 (×5): qty 2

## 2015-11-27 MED ORDER — CONJ ESTROG-MEDROXYPROGEST ACE 0.625-2.5 MG PO TABS: 1.0000 | ORAL_TABLET | Freq: Every day | ORAL | Status: DC

## 2015-11-27 MED ORDER — ZOLPIDEM TARTRATE 5 MG PO TABS
5.0000 mg | ORAL_TABLET | Freq: Every evening | ORAL | Status: DC | PRN
  Administered 2015-11-27: 5 mg via ORAL
  Filled 2015-11-27: qty 1

## 2015-11-27 MED ORDER — SODIUM CHLORIDE 0.9 % IV BOLUS (SEPSIS)
500.0000 mL | Freq: Once | INTRAVENOUS | Status: AC
  Administered 2015-11-27: 500 mL via INTRAVENOUS

## 2015-11-27 MED ORDER — POTASSIUM CHLORIDE CRYS ER 20 MEQ PO TBCR
40.0000 meq | EXTENDED_RELEASE_TABLET | Freq: Two times a day (BID) | ORAL | Status: DC
  Administered 2015-11-27 – 2015-11-28 (×3): 40 meq via ORAL
  Filled 2015-11-27 (×3): qty 2

## 2015-11-27 MED ORDER — VERAPAMIL HCL ER 180 MG PO TBCR
180.0000 mg | EXTENDED_RELEASE_TABLET | Freq: Every day | ORAL | Status: DC
  Administered 2015-11-27 – 2015-12-02 (×6): 180 mg via ORAL
  Filled 2015-11-27 (×6): qty 1

## 2015-11-27 MED ORDER — POTASSIUM CHLORIDE 20 MEQ/15ML (10%) PO SOLN: 40.0000 meq | Freq: Two times a day (BID) | ORAL | Status: DC

## 2015-11-27 NOTE — Progress Notes (Signed)
Palliative Care consult received and chart reviewed. We will follow along and are happy to assist in the care of Carol Buchanan and provide extra support to the patient and family. Current providers are providing excellent primary palliative care services- will check in 5/4 and hopeful for improvement.  Lane Hacker, DO Palliative Medicine

## 2015-11-27 NOTE — Progress Notes (Signed)
PROGRESS NOTE    CHEYLA DUCHEMIN  TIR:[id number] DOB: 1961/06/26 DOA: 11/25/2015  PCP: Andria Frames, MD  Outpatient Specialists:  [facility]--[facility]--Neurology    Brief Narrative:  55 year old female with a history of COPD, metastatic non-small cell lung cancer on radiation and chemotherapy started February 2017 (last chemo tx 11/13/15), presented with one-week history of worsening shortness of breath and constipation. The patient also complained of fevers and chills with a temperature up to 102.50F at home.   She contacted her medical oncologist on 11/25/2015 and was prescribed cefdinir in the AM but as she did not improve, she came in to the ER by EMS on the same evening. Pox checked by her neighbor was in 29s. Given solumedrol and Atrovent by EMS.  Assessment & Plan:   Principal Problem:   Acute hypoxemic respiratory failure- h/o COPD and lung CA s/p radiation and on chemo - CT chest reveals known mass/ nodule in LUL which is unchanged, extensive interstitial opacities(increased from prior images) with sparing in LUL - uncertain at this time if this is an infection vs lymphangitic spread or side effect of chemo - PCCM asssiting with management - on 13 L nasal cannula -cont Prednisone, Zosyn, Nebs, Singulair - 2 D ECHO does not reveal evidence of underlying CHf  Active Problems:  Lactic acidosis Results for KRYSTINA, STRIETER (MRN [id number]) as of 11/27/2015 12:01  Ref. Range 11/25/2015 19:43 11/25/2015 22:42 11/26/2015 02:56 11/26/2015 05:15 11/27/2015 03:08  Lactic Acid, Venous Latest Ref Range: 0.5-2.0 mmol/L 4.15 (HH) 4.3 (HH) 3.6 (HH) 2.4 (HH) 2.9 (HH)     Primary cancer of left upper lobe of lung  - non- small cell, metastatic to T4 - diagnosed in 2/17 - s/p radiation and 4 cycles of Pembrolizumab by Dr Marin Olp - last chemo 4/19 with plans to repeat in 3 wks  T4 lytic lesion- Metastasis - no collapse in vertebra  DM (diabetes mellitus),  type 2  - last A1c 6.7 on 5/2  Essential hypertension - Verapamil on hold- can resume as BP elevated    DVT prophylaxis: Lovenox Code Status: DNR Family Communication:   Disposition Plan: follow in SDU while on high level of O2   Consultants:   PCCM  oncology  Procedures:   none  Antimicrobials:  Anti-infectives    Start     Dose/Rate Route Frequency Ordered Stop   11/26/15 0400  piperacillin-tazobactam (ZOSYN) IVPB 3.375 g  Status:  Discontinued     3.375 g 100 mL/hr over 30 Minutes Intravenous Every 8 hours 11/26/15 0203 11/26/15 0213   11/26/15 0400  piperacillin-tazobactam (ZOSYN) IVPB 3.375 g     3.375 g 12.5 mL/hr over 240 Minutes Intravenous Every 8 hours 11/26/15 0213     11/25/15 2015  vancomycin (VANCOCIN) IVPB 1000 mg/200 mL premix     1,000 mg 200 mL/hr over 60 Minutes Intravenous  Once 11/25/15 2001 11/25/15 2137   11/25/15 2015  piperacillin-tazobactam (ZOSYN) IVPB 3.375 g     3.375 g 100 mL/hr over 30 Minutes Intravenous  Once 11/25/15 2001 11/25/15 2051      Subjective: Dyspnea when she moves around. No cough or chest pain. No nausea or vomiting. Has a good appetite.   Objective: Filed Vitals:   11/27/15 0900 11/27/15 0919 11/27/15 1000 11/27/15 1100  BP:  138/78    Pulse: 103 91 101 100  Temp:      TempSrc:      Resp: '20 17 26 20  '$ Height:  Weight:      SpO2: 94% 93% 94% 94%    Intake/Output Summary (Last 24 hours) at 11/27/15 1154 Last data filed at 11/27/15 1100  Gross per 24 hour  Intake 1283.33 ml  Output   2650 ml  Net -1366.67 ml   Filed Weights   11/26/15 0209  Weight: 76.4 kg (168 lb 6.9 oz)    Examination: General exam: Appears comfortable  HEENT: PERRLA, oral mucosa moist, no sclera icterus or thrush Respiratory system: crackles in LLL- pulse ox 89% on 13 L O2 Cardiovascular system: S1 & S2 heard, RRR.  No murmurs  Gastrointestinal system: Abdomen soft, non-tender, nondistended. Normal bowel sound. No  organomegaly Central nervous system: Alert and oriented. No focal neurological deficits. Extremities: No cyanosis, clubbing or edema Skin: No rashes or ulcers Psychiatry:  Mood & affect appropriate.     Data Reviewed: I have personally reviewed following labs and imaging studies  CBC:  Recent Labs Lab 11/25/15 1936 11/25/15 2011 11/26/15 0254 11/27/15 0308  WBC 12.0*  --  10.1 13.6*  NEUTROABS 11.4*  --   --   --   HGB 12.4 13.6 11.2* 10.5*  HCT 37.1 40.0 33.2* 31.9*  MCV 96.1  --  96.5 98.5  PLT 201  --  176 202   Basic Metabolic Panel:  Recent Labs Lab 11/25/15 2011 11/25/15 2040 11/26/15 0254 11/27/15 0308  NA 131* 135 135 137  K 6.2* 4.1 4.3 3.7  CL 101 102 105 103  CO2  --  18* 18* 22  GLUCOSE 274* 286* 282* 187*  BUN 23* 16 14 21*  CREATININE 0.60 0.86 0.65 0.83  CALCIUM  --  9.2 8.8* 8.2*  MG  --   --  2.1  --   PHOS  --   --  3.1  --    GFR: Estimated Creatinine Clearance: 76.7 mL/min (by C-G formula based on Cr of 0.83). Liver Function Tests:  Recent Labs Lab 11/25/15 2040 11/26/15 0254  AST 40 37  ALT 31 27  ALKPHOS 83 74  BILITOT 1.1 0.6  PROT 7.3 6.8  ALBUMIN 3.5 3.2*   No results for input(s): LIPASE, AMYLASE in the last 168 hours. No results for input(s): AMMONIA in the last 168 hours. Coagulation Profile: No results for input(s): INR, PROTIME in the last 168 hours. Cardiac Enzymes: No results for input(s): CKTOTAL, CKMB, CKMBINDEX, TROPONINI in the last 168 hours. BNP (last 3 results) No results for input(s): PROBNP in the last 8760 hours. HbA1C:  Recent Labs  11/26/15 0254  HGBA1C 6.7*   CBG:  Recent Labs Lab 11/26/15 0728 11/26/15 1131 11/26/15 1701 11/26/15 2134 11/27/15 0742  GLUCAP 160* 181* 166* 190* 121*   Lipid Profile: No results for input(s): CHOL, HDL, LDLCALC, TRIG, CHOLHDL, LDLDIRECT in the last 72 hours. Thyroid Function Tests:  Recent Labs  11/26/15 0254  TSH 0.857   Anemia Panel: No results  for input(s): VITAMINB12, FOLATE, FERRITIN, TIBC, IRON, RETICCTPCT in the last 72 hours. Urine analysis:    Component Value Date/Time   COLORURINE YELLOW 11/26/2015 0203   APPEARANCEUR CLEAR 11/26/2015 0203   LABSPEC >1.046* 11/26/2015 0203   PHURINE 5.0 11/26/2015 0203   GLUCOSEU >1000* 11/26/2015 0203   HGBUR NEGATIVE 11/26/2015 0203   BILIRUBINUR NEGATIVE 11/26/2015 0203   KETONESUR NEGATIVE 11/26/2015 0203   PROTEINUR 30* 11/26/2015 0203   NITRITE NEGATIVE 11/26/2015 0203   LEUKOCYTESUR NEGATIVE 11/26/2015 0203   Sepsis Labs: '@LABRCNTIP'$ (procalcitonin:4,lacticidven:4)  ) Recent Results (from the  past 240 hour(s))  Blood culture (routine x 2)     Status: None (Preliminary result)   Collection Time: 11/25/15  8:07 PM  Result Value Ref Range Status   Specimen Description BLOOD RIGHT HAND  Final   Special Requests BOTTLES DRAWN AEROBIC AND ANAEROBIC 10 ML  Final   Culture   Final    NO GROWTH < 12 HOURS Performed at [facility]    Report Status PENDING  Incomplete  Blood culture (routine x 2)     Status: None (Preliminary result)   Collection Time: 11/25/15  8:07 PM  Result Value Ref Range Status   Specimen Description BLOOD LEFT HAND  Final   Special Requests BOTTLES DRAWN AEROBIC AND ANAEROBIC 10 ML  Final   Culture   Final    NO GROWTH < 12 HOURS Performed at [facility]    Report Status PENDING  Incomplete  MRSA PCR Screening     Status: None   Collection Time: 11/26/15  1:58 AM  Result Value Ref Range Status   MRSA by PCR NEGATIVE NEGATIVE Final    Comment:        The GeneXpert MRSA Assay (FDA approved for NASAL specimens only), is one component of a comprehensive MRSA colonization surveillance program. It is not intended to diagnose MRSA infection nor to guide or monitor treatment for MRSA infections.          Radiology Studies: Ct Angio Chest Pe W/cm &/or Wo Cm  11/25/2015  CLINICAL DATA:  Stage IV lung cancer, now with shortness of  breath. Evaluate pulmonary embolism. EXAM: CT ANGIOGRAPHY CHEST WITH CONTRAST TECHNIQUE: Multidetector CT imaging of the chest was performed using the standard protocol during bolus administration of intravenous contrast. Multiplanar CT image reconstructions and MIPs were obtained to evaluate the vascular anatomy. CONTRAST:  100 cc Isovue 370 COMPARISON:  Chest CT - 11/07/2015; 06/21/2015 PET-CT - 11/04/2015 FINDINGS: Vascular Findings: There is adequate opacification of the pulmonary arterial system with the main pulmonary artery measuring 236 Hounsfield units. No discrete filling defects are seen within the pulmonary arterial tree to suggest pulmonary embolism. There is unchanged malignant narrowing involving the left upper lobe pulmonary artery secondary to known left perihilar mass. Normal caliber of the main pulmonary artery. Cardiomegaly. No pericardial effusion. Mixed calcified and noncalcified atherosclerotic plaque with a normal caliber thoracic aorta. No definite thoracic aortic dissection on this nongated examination. Note is again made of an apparent right subclavian artery. The branch vessels of the aortic arch widely patent throughout their imaged course. Review of the MIP images confirms the above findings. ---------------------------------------------------------------------------------- Nonvascular Findings: Mediastinum/Lymph Nodes: Scattered mediastinal lymph nodes are numerous though individually not enlarged by size criteria with index precarinal lymph node measuring 0.7 cm in greatest short axis diameter (image 30, series 4) and index left infrahilar lymph node measuring 0.9 cm (image 37, series 4). No axillary lymphadenopathy Lungs/Pleura: Known slightly spiculated nodule/mass within in the medial aspect the left upper lobe is grossly unchanged, measuring approximately 3.0 x 1.7 cm in diameter (image 32, series 7) though note, exact measurements are difficult secondary to the irregular shape of  this nodule/mass. Worsening interstitial thickening throughout the remainder of the lungs with relative sparing of the left upper lobe. Worsening ill-defined areas of ground-glass within the bilateral lung bases. Air bronchograms are seen within the superior segment of the lingula. The central pulmonary airways are patent. Upper abdomen: Limited early arterial phase evaluation of the upper abdomen is normal. Musculoskeletal:  Known ill-defined lytic lesion involving the T4 vertebral body appears unchanged (sagittal image 82, series 9). Increased sclerosis involving the T5, T6 and T7 vertebral bodies is grossly unchanged. Regional soft tissues appear normal. Note is made of a peripherally calcified approximately 1.3 cm nodule within the caudal aspect the right lobe of the thyroid (image 11, series 4). IMPRESSION: 1. No evidence of pulmonary embolism. 2. Known nodule/mass with the medial aspect of the left upper lobe is grossly unchanged and compatible with provided history of lung cancer. 3. Worsening rather extensive interstitial opacities throughout the lungs with conspicuous sparing of the left upper lobe, progressed compared to PET-CT performed 11/04/2015 as well as more recently performed chest CT performed 11/07/2015 - findings are not specific with differential considerations include asymmetric pulmonary edema, atypical infection, post treatment pneumonitis, though note, lymphangitic spread of tumor could have a similar appearance. 4. Unchanged appearance of known lytic lesion involving the T4 vertebral body without associated collapse. Electronically Signed   By: Sandi Mariscal M.D.   On: 11/25/2015 21:52   Dg Chest Port 1 View  11/25/2015  CLINICAL DATA:  Chest pain and shortness of breath. EXAM: PORTABLE CHEST 1 VIEW COMPARISON:  Chest radiographs dated 06/21/2015, chest CTA dated 11/07/2015 and PET-CT dated 11/04/2015. The FINDINGS: Interval patchy opacity in the lateral aspect of the left lower lung zone,  obscuring the left heart border. Diffusely prominent interstitial markings with improvement compared to the most recent CT. The left upper lobe mass is poorly visualized. No visible pleural fluid. Unremarkable bones. IMPRESSION: 1. Interval left lateral lower lung airspace opacity suspicious for pneumonia or postobstructive changes. 2. Diffuse interstitial lung disease, increased since 06/21/2015 and decreased since 11/07/2015. This could represent a combination of chronic interstitial lung disease, interstitial pneumonitis and/or lymphangitic spread of tumor. Electronically Signed   By: Claudie Revering M.D.   On: 11/25/2015 19:57        Scheduled Meds: . DULoxetine  60 mg Oral Daily  . enoxaparin (LOVENOX) injection  40 mg Subcutaneous QHS  . guaiFENesin  600 mg Oral BID  . insulin aspart  0-15 Units Subcutaneous TID WC  . insulin aspart  0-5 Units Subcutaneous QHS  . ipratropium-albuterol  3 mL Nebulization Q6H  . montelukast  10 mg Oral QHS  . pantoprazole  40 mg Oral Daily  . piperacillin-tazobactam (ZOSYN)  IV  3.375 g Intravenous Q8H  . polyethylene glycol  17 g Oral BID  . predniSONE  50 mg Oral Q breakfast  . simvastatin  20 mg Oral q1800  . sodium chloride flush  3 mL Intravenous Q12H  . venlafaxine XR  150 mg Oral Daily   Continuous Infusions:    LOS: 2 days    Time spent in minutes: 48    Manuel Garcia, MD [facility] Pager: [email or web address] Password [email or web address] 11/27/2015, 11:54 AM

## 2015-11-27 NOTE — Consult Note (Signed)
PULMONARY / CRITICAL CARE MEDICINE   Name: Carol Buchanan MRN: [id number] DOB: 01/19/1961    ADMISSION DATE:  11/25/2015 CONSULTATION DATE:  11/27/2015   REFERRING MD:  Dr Shanon Brow Tat  CHIEF COMPLAINT:  Acute resp failure hypoxemic BRIEF - 55 year old female follows for copd nos with dr Lamonte Sakai but not on home o2 and Dr Marin Olp for metastatic non-small cell lung cancer. Currently on immunotherapy PD-1 antibody keytrudea. She was admitted 11/25/2015. She had worsening shortness of breath with the results of fever and chills with a temperature of 102 Fahrenheit at home. She was noticed to be hypoxemic at home and then admitted. She did not respond to outpatient cephalosporin. In the hospital CT angiogram ruled out pulmonary embolism but showed worsening diffuse groundglass opacities  Off note she has finished 4 cycles of ketyrida. PET scan 11/04/2015  showed a positive response with decrease in the left upper lobe mass according to the oncology notes. She also had resolution of lower lobe atelectasis but it did show groundglass opacities . This was new compared to CT scan at the time of diagnosis of cancer in end of 2016. It appears that she also had a CT angiogram chest 3 days after the PET scan on 11/07/2015 that continued to show groundglass opacities. Currently 11/25/2015 the ground glass opacities are worse.   Patient is now on 10 L nasal cannula and this is a new hypoxemia for her    SUBJECTIVE/OVERNIGHT/INTERVAL HX 11/27/15 - > on 13L [place]. Easy desats on transfers. ECHO - ef 65%, gr1 diast dysfn, trivial pericardial effusi only   VITAL SIGNS: BP 138/78 mmHg  Pulse 100  Temp(Src) 97.5 F (36.4 C) (Oral)  Resp 20  Ht '5\' 4"'$  (1.626 m)  Wt 76.4 kg (168 lb 6.9 oz)  BMI 28.90 kg/m2  SpO2 94%  HEMODYNAMICS:    VENTILATOR SETTINGS:    INTAKE / OUTPUT: I/O last 3 completed shifts: In: 2952.7 [P.O.:520; I.V.:581.7; Other:1000; IV Piggyback:851] Out: 2900 [Urine:2900]  PHYSICAL  EXAMINATION: Generalobese lady. Looks chronically unwell but fairly okay Neuro: Alert and oriented 3. Speech normal. Moves all 4 extremities.  HEENT:  Nasal cannula on. No elevated JVP. No neck nodes Cardiovascular:  Regular rate and rhythm. No murmurs  Lungs:  clear to auscultation bilaterally anteriorly but basal crackles present. Some inc wob and desat while talking Musculoskeletal:  Trace edema present both Skin exam: Intact in the exposed areas  ABS: PULMONARY  Recent Labs Lab 11/25/15 2011 11/25/15 2143  PHART  --  7.400  PCO2ART  --  27.0*  PO2ART  --  63.3*  HCO3  --  16.5*  TCO2 22 15.0  O2SAT  --  90.8    CBC  Recent Labs Lab 11/25/15 1936 11/25/15 2011 11/26/15 0254 11/27/15 0308  HGB 12.4 13.6 11.2* 10.5*  HCT 37.1 40.0 33.2* 31.9*  WBC 12.0*  --  10.1 13.6*  PLT 201  --  176 171    COAGULATION No results for input(s): INR in the last 168 hours.  CARDIAC  No results for input(s): TROPONINI in the last 168 hours. No results for input(s): PROBNP in the last 168 hours.   CHEMISTRY  Recent Labs Lab 11/25/15 2011 11/25/15 2040 11/26/15 0254 11/27/15 0308  NA 131* 135 135 137  K 6.2* 4.1 4.3 3.7  CL 101 102 105 103  CO2  --  18* 18* 22  GLUCOSE 274* 286* 282* 187*  BUN 23* 16 14 21*  CREATININE 0.60 0.86  0.65 0.83  CALCIUM  --  9.2 8.8* 8.2*  MG  --   --  2.1  --   PHOS  --   --  3.1  --    Estimated Creatinine Clearance: 76.7 mL/min (by C-G formula based on Cr of 0.83).   LIVER  Recent Labs Lab 11/25/15 2040 11/26/15 0254  AST 40 37  ALT 31 27  ALKPHOS 83 74  BILITOT 1.1 0.6  PROT 7.3 6.8  ALBUMIN 3.5 3.2*     INFECTIOUS  Recent Labs Lab 11/26/15 0254 11/26/15 0256 11/26/15 0515 11/27/15 0308  LATICACIDVEN  --  3.6* 2.4* 2.9*  PROCALCITON <0.10  --   --  <0.10     ENDOCRINE CBG (last 3)   Recent Labs  11/26/15 1701 11/26/15 2134 11/27/15 0742  GLUCAP 166* 190* 121*         IMAGING x48h  - image(s)  personally visualized  -   highlighted in bold Ct Angio Chest Pe W/cm &/or Wo Cm  11/25/2015  CLINICAL DATA:  Stage IV lung cancer, now with shortness of breath. Evaluate pulmonary embolism. EXAM: CT ANGIOGRAPHY CHEST WITH CONTRAST TECHNIQUE: Multidetector CT imaging of the chest was performed using the standard protocol during bolus administration of intravenous contrast. Multiplanar CT image reconstructions and MIPs were obtained to evaluate the vascular anatomy. CONTRAST:  100 cc Isovue 370 COMPARISON:  Chest CT - 11/07/2015; 06/21/2015 PET-CT - 11/04/2015 FINDINGS: Vascular Findings: There is adequate opacification of the pulmonary arterial system with the main pulmonary artery measuring 236 Hounsfield units. No discrete filling defects are seen within the pulmonary arterial tree to suggest pulmonary embolism. There is unchanged malignant narrowing involving the left upper lobe pulmonary artery secondary to known left perihilar mass. Normal caliber of the main pulmonary artery. Cardiomegaly. No pericardial effusion. Mixed calcified and noncalcified atherosclerotic plaque with a normal caliber thoracic aorta. No definite thoracic aortic dissection on this nongated examination. Note is again made of an apparent right subclavian artery. The branch vessels of the aortic arch widely patent throughout their imaged course. Review of the MIP images confirms the above findings. ---------------------------------------------------------------------------------- Nonvascular Findings: Mediastinum/Lymph Nodes: Scattered mediastinal lymph nodes are numerous though individually not enlarged by size criteria with index precarinal lymph node measuring 0.7 cm in greatest short axis diameter (image 30, series 4) and index left infrahilar lymph node measuring 0.9 cm (image 37, series 4). No axillary lymphadenopathy Lungs/Pleura: Known slightly spiculated nodule/mass within in the medial aspect the left upper lobe is grossly  unchanged, measuring approximately 3.0 x 1.7 cm in diameter (image 32, series 7) though note, exact measurements are difficult secondary to the irregular shape of this nodule/mass. Worsening interstitial thickening throughout the remainder of the lungs with relative sparing of the left upper lobe. Worsening ill-defined areas of ground-glass within the bilateral lung bases. Air bronchograms are seen within the superior segment of the lingula. The central pulmonary airways are patent. Upper abdomen: Limited early arterial phase evaluation of the upper abdomen is normal. Musculoskeletal: Known ill-defined lytic lesion involving the T4 vertebral body appears unchanged (sagittal image 82, series 9). Increased sclerosis involving the T5, T6 and T7 vertebral bodies is grossly unchanged. Regional soft tissues appear normal. Note is made of a peripherally calcified approximately 1.3 cm nodule within the caudal aspect the right lobe of the thyroid (image 11, series 4). IMPRESSION: 1. No evidence of pulmonary embolism. 2. Known nodule/mass with the medial aspect of the left upper lobe is grossly unchanged and compatible  with provided history of lung cancer. 3. Worsening rather extensive interstitial opacities throughout the lungs with conspicuous sparing of the left upper lobe, progressed compared to PET-CT performed 11/04/2015 as well as more recently performed chest CT performed 11/07/2015 - findings are not specific with differential considerations include asymmetric pulmonary edema, atypical infection, post treatment pneumonitis, though note, lymphangitic spread of tumor could have a similar appearance. 4. Unchanged appearance of known lytic lesion involving the T4 vertebral body without associated collapse. Electronically Signed   By: Sandi Mariscal M.D.   On: 11/25/2015 21:52   Dg Chest Port 1 View  11/25/2015  CLINICAL DATA:  Chest pain and shortness of breath. EXAM: PORTABLE CHEST 1 VIEW COMPARISON:  Chest radiographs  dated 06/21/2015, chest CTA dated 11/07/2015 and PET-CT dated 11/04/2015. The FINDINGS: Interval patchy opacity in the lateral aspect of the left lower lung zone, obscuring the left heart border. Diffusely prominent interstitial markings with improvement compared to the most recent CT. The left upper lobe mass is poorly visualized. No visible pleural fluid. Unremarkable bones. IMPRESSION: 1. Interval left lateral lower lung airspace opacity suspicious for pneumonia or postobstructive changes. 2. Diffuse interstitial lung disease, increased since 06/21/2015 and decreased since 11/07/2015. This could represent a combination of chronic interstitial lung disease, interstitial pneumonitis and/or lymphangitic spread of tumor. Electronically Signed   By: Claudie Revering M.D.   On: 11/25/2015 19:57          Anti-infectives    Start     Dose/Rate Route Frequency Ordered Stop   11/26/15 0400  piperacillin-tazobactam (ZOSYN) IVPB 3.375 g  Status:  Discontinued     3.375 g 100 mL/hr over 30 Minutes Intravenous Every 8 hours 11/26/15 0203 11/26/15 0213   11/26/15 0400  piperacillin-tazobactam (ZOSYN) IVPB 3.375 g     3.375 g 12.5 mL/hr over 240 Minutes Intravenous Every 8 hours 11/26/15 0213     11/25/15 2015  vancomycin (VANCOCIN) IVPB 1000 mg/200 mL premix     1,000 mg 200 mL/hr over 60 Minutes Intravenous  Once 11/25/15 2001 11/25/15 2137   11/25/15 2015  piperacillin-tazobactam (ZOSYN) IVPB 3.375 g     3.375 g 100 mL/hr over 30 Minutes Intravenous  Once 11/25/15 2001 11/25/15 2051       CODE    Code Status Orders        Start     Ordered   11/26/15 0204  Do not attempt resuscitation (DNR)   Continuous    Question Answer Comment  In the event of cardiac or respiratory ARREST Do not call a "code blue"   In the event of cardiac or respiratory ARREST Do not perform Intubation, CPR, defibrillation or ACLS   In the event of cardiac or respiratory ARREST Use medication by any route, position,  wound care, and other measures to relive pain and suffering. May use oxygen, suction and manual treatment of airway obstruction as needed for comfort.      11/26/15 0203    Code Status History    Date Active Date Inactive Code Status Order ID Comments User Context   This patient has a current code status but no historical code status.       ASSESSMENT / PLAN:  PULMONARY A: Acute hypoxemic respiratory failure with infiltrates -   - Differential diagnosis is pulmonary toxicity from PD-1 antibody (most likely) v lymphangitis carcinomatosis baseline problems  - Current worsening could be due to above getting worse versus superimposed viral/HCAP. Alternatively diastolic heart failure  3/0/16 -  I think she is somewhat worse.  CHF and resp virus looknig unlikely. HCAP looking yunlikely. Most likely [place] v lympantghitis. Poor prognosis  P:   Restart aggressive Diuresis - empiric to see if we can get some benegfit   high-dose steroids - restart IV (dc po) Broad antibiotics to ciontinue for now QHS BiPAP if she gets worse; no intubation  If no response to above, then candidate for surgical lung bx - she is high risk for complications for that. Reg: bronc/tbbx - will not withstand sedation + is not sensitive enough to make dx  METABOLIC A High persistesnt mild lactic acidosis. Unclear etiology  P Repeat lactic stat Dc singulair, dc estrogens If lactate getting worse -> try fluid challenge (unclear why worse)   If unimproved needs eol discussion   I updated her of above   The patient is critically ill with multiple organ systems failure and requires high complexity decision making for assessment and support, frequent evaluation and titration of therapies, application of advanced monitoring technologies and extensive interpretation of multiple databases.   Critical Care Time devoted to patient care services described in this note is  30  Minutes. This time reflects time of care  of this signee Dr Brand Males. This critical care time does not reflect procedure time, or teaching time or supervisory time of PA/NP/Med student/Med Resident etc but could involve care discussion time    Dr. Brand Males, M.D., [facility].C.P Pulmonary and Critical Care Medicine Staff Physician [place] [facility] Pager: [phone], If no answer or between  15:00h - 7:00h: call [phone]  11/27/2015 12:03 PM

## 2015-11-27 NOTE — Progress Notes (Signed)
Ms. Rembert might be a little better. Her oxygen saturation at rest is improving. However, her nurse says that when she moves around in gets up, she desaturates down to the 80s.  So for, her cultures are negative. There is no influenza or H1N1.  She's not coughing. Her appetite is good. She is on steroids. She is getting nebulizers. This seems to be helping her a little bit.  The really issue that we have is whether not this might be from the [place]. Pneumonitis is a very low risk with Beryle Flock but yet is some that we have to worry about. Lymphangitic tumor spread is also a possibility area  I think that she probably is going to end up needing a bronchoscopy and possibly a transbronchial biopsy.  A chest x-ray might be helpful tomorrow.  She has no nausea or vomiting. There is no rashes.  Her labs look okay. Her lactic acid is still on the high side at 2.9. Her renal function looks pretty stable. Potassium is okay. Her white cell count is 13.6. I suspect this probably is a little elevated from the steroids.  On exam, her vital signs were all stable. Her blood pressure is 133/58. Pulse is 104. Temperature 98.1. Lungs sound pretty good bilaterally. Cardiac exam slightly tachycardic but regular. Abdomen is soft.  I think we are still stuck with not knowing what is going on. Again, with these new immunotherapy treatments, we can see unusual side effects. If this is from the [facility], steroids really should be able to help.  We will continue to follow along. She is in incredibly capable and compassionate hands with the ICU staff.  Pete E.  Philippians 4:19

## 2015-11-27 NOTE — Progress Notes (Signed)
CRITICAL VALUE ALERT  Critical value received:  Lactic Acid: 2.4  Date of notification:  11/27/2015  Time of notification:  1448  Critical value read back:Yes.    Nurse who received alert:  Leanna Sato, RN  MD notified (1st page):  Marni Griffon, NP  Time of first page:  1322  Time MD responded:  1322

## 2015-11-27 NOTE — Progress Notes (Signed)
CRITICAL VALUE ALERT  Critical value received:  Lactic acid 2.9  Date of notification:  11/27/2015  Time of notification:  0350  Critical value read back:Yes.    Nurse who received alert:  Reynold Bowen  MD notified (1st page):  Schorr, Belenda Cruise, NP  Time of first page:  [phone]  Responding MD:  Chaney Malling, NP  Time MD responded:  [phone]

## 2015-11-28 ENCOUNTER — Ambulatory Visit: Payer: BLUE CROSS/BLUE SHIELD

## 2015-11-28 ENCOUNTER — Inpatient Hospital Stay (HOSPITAL_COMMUNITY): Payer: Medicare Other

## 2015-11-28 ENCOUNTER — Other Ambulatory Visit: Payer: BLUE CROSS/BLUE SHIELD

## 2015-11-28 ENCOUNTER — Ambulatory Visit: Payer: BLUE CROSS/BLUE SHIELD | Admitting: Family

## 2015-11-28 DIAGNOSIS — J441 Chronic obstructive pulmonary disease with (acute) exacerbation: Secondary | ICD-10-CM

## 2015-11-28 DIAGNOSIS — E1165 Type 2 diabetes mellitus with hyperglycemia: Secondary | ICD-10-CM

## 2015-11-28 LAB — GLUCOSE, CAPILLARY
GLUCOSE-CAPILLARY: 246 mg/dL — AB (ref 65–99)
GLUCOSE-CAPILLARY: 179 mg/dL — AB (ref 65–99)
GLUCOSE-CAPILLARY: 286 mg/dL — AB (ref 65–99)
Glucose-Capillary: 174 mg/dL — ABNORMAL HIGH (ref 65–99)

## 2015-11-28 LAB — MAGNESIUM: Magnesium: 2 mg/dL (ref 1.7–2.4)

## 2015-11-28 LAB — TROPONIN I: Troponin I: <0.03 ng/mL (ref <0.031)

## 2015-11-28 LAB — PHOSPHORUS: PHOSPHORUS: 4 mg/dL (ref 2.5–4.6)

## 2015-11-28 LAB — LACTIC ACID, PLASMA: LACTIC ACID, VENOUS: 2 mmol/L (ref 0.5–2.0)

## 2015-11-28 LAB — PROCALCITONIN

## 2015-11-28 MED ORDER — GABAPENTIN 300 MG PO CAPS
600.0000 mg | ORAL_CAPSULE | Freq: Every day | ORAL | Status: DC
  Filled 2015-11-28 (×4): qty 2

## 2015-11-28 MED ORDER — METHYLPREDNISOLONE SODIUM SUCC 40 MG IJ SOLR
40.0000 mg | Freq: Two times a day (BID) | INTRAMUSCULAR | Status: DC
  Administered 2015-11-28 – 2015-11-29 (×2): 40 mg via INTRAVENOUS
  Filled 2015-11-28 (×2): qty 1

## 2015-11-28 MED ORDER — DIAZEPAM 5 MG PO TABS
5.0000 mg | ORAL_TABLET | Freq: Four times a day (QID) | ORAL | Status: DC | PRN
  Administered 2015-11-28: 5 mg via ORAL
  Filled 2015-11-28 (×2): qty 1

## 2015-11-28 MED ORDER — PROPRANOLOL HCL 10 MG PO TABS
10.0000 mg | ORAL_TABLET | Freq: Three times a day (TID) | ORAL | Status: DC | PRN
  Administered 2015-12-01: 10 mg via ORAL
  Filled 2015-11-28 (×2): qty 1

## 2015-11-28 NOTE — Progress Notes (Signed)
PULMONARY / CRITICAL CARE MEDICINE   Name: Carol Buchanan MRN: [id number] DOB: May 21, 1961    ADMISSION DATE:  11/25/2015 CONSULTATION DATE:  11/28/2015   REFERRING MD:  Dr Shanon Brow Tat  CHIEF COMPLAINT:  Acute resp failure hypoxemic BRIEF - 55 year old female follows for copd nos with dr Lamonte Sakai but not on home o2 and Dr Marin Olp for metastatic non-small cell lung cancer. Currently on immunotherapy PD-1 antibody keytrudea. She was admitted 11/25/2015. She had worsening shortness of breath with the results of fever and chills with a temperature of 102 Fahrenheit at home. She was noticed to be hypoxemic at home and then admitted. She did not respond to outpatient cephalosporin. In the hospital CT angiogram ruled out pulmonary embolism but showed worsening diffuse groundglass opacities  Off note she has finished 4 cycles of ketyrida. PET scan 11/04/2015  showed a positive response with decrease in the left upper lobe mass according to the oncology notes. She also had resolution of lower lobe atelectasis but it did show groundglass opacities . This was new compared to CT scan at the time of diagnosis of cancer in end of 2016. It appears that she also had a CT angiogram chest 3 days after the PET scan on 11/07/2015 that continued to show groundglass opacities. Currently 11/25/2015 the ground glass opacities are worse.   Patient is now on 10 L nasal cannula and this is a new hypoxemia for her    SUBJECTIVE/OVERNIGHT/INTERVAL HX    VITAL SIGNS: BP 120/62 mmHg  Pulse 83  Temp(Src) 97.9 F (36.6 C) (Oral)  Resp 18  Ht '5\' 4"'$  (1.626 m)  Wt 168 lb 6.9 oz (76.4 kg)  BMI 28.90 kg/m2  SpO2 91% 13 liters  HEMODYNAMICS:    VENTILATOR SETTINGS:    INTAKE / OUTPUT: I/O last 3 completed shifts: In: 910 [P.O.:120; Other:40; IV Piggyback:750] Out: 5300 [Urine:5300]  PHYSICAL EXAMINATION: Generalobese lady. Looks chronically unwell but fairly okay Neuro: Alert and oriented 3. Speech normal.  Moves all 4 extremities.  HEENT:  Nasal cannula on. No elevated JVP. No neck nodes Cardiovascular:  Regular rate and rhythm. No murmurs  Lungs:  clear to auscultation bilaterally anteriorly but basal crackles present. Accessory muscles use persists  Musculoskeletal:  Trace edema present both Skin exam: Intact in the exposed areas  ABS: PULMONARY  Recent Labs Lab 11/25/15 2011 11/25/15 2143  PHART  --  7.400  PCO2ART  --  27.0*  PO2ART  --  63.3*  HCO3  --  16.5*  TCO2 22 15.0  O2SAT  --  90.8    CBC  Recent Labs Lab 11/25/15 1936 11/25/15 2011 11/26/15 0254 11/27/15 0308  HGB 12.4 13.6 11.2* 10.5*  HCT 37.1 40.0 33.2* 31.9*  WBC 12.0*  --  10.1 13.6*  PLT 201  --  176 171    COAGULATION No results for input(s): INR in the last 168 hours.  CARDIAC    Recent Labs Lab 11/28/15 0314  TROPONINI <0.03   No results for input(s): PROBNP in the last 168 hours.   CHEMISTRY  Recent Labs Lab 11/25/15 2011 11/25/15 2040 11/26/15 0254 11/27/15 0308 11/28/15 0314  NA 131* 135 135 137  --   K 6.2* 4.1 4.3 3.7  --   CL 101 102 105 103  --   CO2  --  18* 18* 22  --   GLUCOSE 274* 286* 282* 187*  --   BUN 23* 16 14 21*  --   CREATININE 0.60  0.86 0.65 0.83  --   CALCIUM  --  9.2 8.8* 8.2*  --   MG  --   --  2.1  --  2.0  PHOS  --   --  3.1  --  4.0   Estimated Creatinine Clearance: 76.7 mL/min (by C-G formula based on Cr of 0.83).   LIVER  Recent Labs Lab 11/25/15 2040 11/26/15 0254  AST 40 37  ALT 31 27  ALKPHOS 83 74  BILITOT 1.1 0.6  PROT 7.3 6.8  ALBUMIN 3.5 3.2*     INFECTIOUS  Recent Labs Lab 11/26/15 0254  11/27/15 0308 11/27/15 1238 11/28/15 0314  LATICACIDVEN  --   < > 2.9* 2.4* 2.0  PROCALCITON <0.10  --  <0.10  --  <0.10  < > = values in this interval not displayed.   ENDOCRINE CBG (last 3)   Recent Labs  11/27/15 1614 11/27/15 2005 11/28/15 0838  GLUCAP 214* 203* 174*         IMAGING x48h  - image(s)  personally visualized  -   highlighted in bold Dg Chest Port 1 View  11/28/2015  CLINICAL DATA:  Respiratory failure, hypoxia, history of lung malignancy, COPD, diabetes. EXAM: PORTABLE CHEST 1 VIEW COMPARISON:  Chest x-ray and chest CT scan of Nov 25, 2015 FINDINGS: The lungs are well-expanded. There is persistent subtle increased density in the right lower lung just above the hemidiaphragm. On the left the retrocardiac region is slightly less dense. There is no large pleural effusion and no pneumothorax. The cardiac silhouette is top-normal in size. The pulmonary vascularity is normal. The bony thorax is unremarkable. IMPRESSION: Slight interval improvement in the appearance of the interstitial processes at the right lung base and left mid and lower lung. There is no evidence of pulmonary edema. The known left upper lobe mass is not clearly evident on today's study. Electronically Signed   By: David  Martinique M.D.   On: 11/28/2015 07:36  PCXR w/ some improvement in aeration on 5/4 CODE    Code Status Orders        Start     Ordered   11/26/15 0204  Do not attempt resuscitation (DNR)   Continuous    Question Answer Comment  In the event of cardiac or respiratory ARREST Do not call a "code blue"   In the event of cardiac or respiratory ARREST Do not perform Intubation, CPR, defibrillation or ACLS   In the event of cardiac or respiratory ARREST Use medication by any route, position, wound care, and other measures to relive pain and suffering. May use oxygen, suction and manual treatment of airway obstruction as needed for comfort.      11/26/15 0203    Code Status History    Date Active Date Inactive Code Status Order ID Comments User Context   This patient has a current code status but no historical code status.       ASSESSMENT / PLAN:   Acute hypoxemic respiratory failure with infiltrates -   - Differential diagnosis is pulmonary toxicity from PD-1 antibody (most likely) v lymphangitis  carcinomatosis baseline problems  - Current worsening could be due to above getting worse versus superimposed viral/HCAP. Alternatively diastolic heart failure  5/4. Most likely [place] v lympantghitis. Poor prognosis but seems to be improving w/ mix of steroids abx and diuresis.  Plan:   Cont lasix as BUN/creatinine allow  high-dose steroids and we can consider decreasing dose in next 48hrs Broad antibiotics to ciontinue  for now (zosyn) QHS BiPAP if she gets worse; no intubation  If no response to above, then candidate for surgical lung bx - she is high risk for complications for that. Reg: bronc/tbbx - will not withstand sedation + is not sensitive enough to make dx   High persistesnt mild lactic acidosis. Unclear etiology-->likely WOB; trending down Plan No further eval   Erick Colace ACNP-BC [place] Pager # [phone] OR # [phone] if no answer  11/28/2015 11:38 AM

## 2015-11-28 NOTE — Progress Notes (Signed)
Inpatient Diabetes Program Recommendations  AACE/ADA: New Consensus Statement on Inpatient Glycemic Control (2015)  Target Ranges:  Prepandial:   less than 140 mg/dL      Peak postprandial:   less than 180 mg/dL (1-2 hours)      Critically ill patients:  140 - 180 mg/dL   Results for ANGELYN, OSTERBERG (MRN [id number]) as of 11/28/2015 09:45  Ref. Range 11/27/2015 07:42 11/27/2015 12:19 11/27/2015 16:14 11/27/2015 20:05  Glucose-Capillary Latest Ref Range: 65-99 mg/dL 121 (H) 112 (H) 214 (H) 203 (H)   Results for PAQUITA, PRINTY (MRN [id number]) as of 11/28/2015 09:45  Ref. Range 11/26/2015 02:54  Hemoglobin A1C Latest Ref Range: 4.8-5.6 % 6.7 (H)     Admit with: SOB  History: Lung Cancer, COPD  Current Insulin Orders: Novolog Moderate Correction Scale/ SSI (0-15 units) TID AC + HS      -Note IV Solumedrol 60 mg Q6 hours started yesterday at 2pm and Prednisone stopped.  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  Will follow up with pt today to see if she has any additional questions.  RNs to provide ongoing DM education with patient at bedside.     MD- Please consider starting Novolog Meal Coverage while patient getting steroids-  Novolog 3 units tid with meals (hold if pt eats <50% of meal)     --Will follow patient during hospitalization--  Wyn Quaker RN, MSN, CDE Diabetes Coordinator Inpatient Glycemic Control Team Team Pager: [phone] (8a-5p)

## 2015-11-28 NOTE — Progress Notes (Signed)
Upon entering patient's room for scheduled nebulizer treatment, pt found with bipap mask off.  Pt states that she can't do it anymore, and it makes her feel claustrophobic.  Pt placed back on 13L HFNC, spo2 91-93%.  RN aware.

## 2015-11-28 NOTE — Consult Note (Signed)
Palliative Consult received. I introduced myself to Carol Buchanan and explained the role of palliative care. While talking with her I question if she is having some neuropsychiatric side effects from the high dose steroids-her speech is extremely pressured and she has psycho-motor agitation. She is not hallucinating and she is overall pleasant-and very hungry. She complains of severe hot flashes and is asking about her Premarin. Most of this is from the steroids-but it can be quite severe. Severe emotional liability as well.  Patient has been researching hospice. She told me she wants to die at home. She is tired and worried about her mother. She doesn't know what to expect. She wants honest and direct information.We discussed uncertainty and focusing on one day at a time and QOL issues are first and foremost that we have little if any control over the dying part.  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 Now on high dose steroids.  1. Respiratory Failure on 13L O2 2. Steroid Induced Side effects: Neuropsychiatric predominant   Symptom Management Recommendations:  1. Diazepam q6 prn for emotional liability and agitation from steroids 2. I stopped Effexor, because the combination of Cymbalta and effexor in setting of high dose steroids can cause serotonin syndrome. She tells me she was not taking both at home. 3. For hot flashes probably best to avoid Premarin in general. Will try gabapentin QHS and tolerate as tolerated. 4. For her peripheral tremor will start low dose propanalol to mitigate some the cholinergic effects of the steroids.  Time: 1230-1:30 Total: 60 minutes Greater than 50%  of this time was spent counseling and coordinating care related to the above assessment and plan.  Carol Hacker, DO Palliative Medicine

## 2015-11-28 NOTE — Progress Notes (Signed)
Pt states that she is unable to tolerated the BIPAP during the night.  Pt wishes to use O2 via [place] throughout the night. Pt to notify RT if she changes her mind.  RT to monitor and assess as needed.

## 2015-11-28 NOTE — Progress Notes (Signed)
PROGRESS NOTE    Carol Buchanan  PIR:[id number] DOB: 1960/08/22 DOA: 11/25/2015  PCP: Andria Frames, MD  Outpatient Specialists:  Lottie Rater By[facility]--P[facility]--[facility]--Neurology    Brief Narrative:  55 year old female with a history of COPD, metastatic non-small cell lung cancer on radiation and chemotherapy started February 2017 (last chemo tx 11/13/15), presented with one-week history of worsening shortness of breath and constipation. The patient also complained of fevers and chills with a temperature up to 102.37F at home.   She contacted her medical oncologist on 11/25/2015 and was prescribed cefdinir in the AM but as she did not improve, she came in to the ER by EMS on the same evening. Pox checked by her neighbor was in 66s. Given solumedrol and Atrovent by EMS.  Assessment & Plan:   Principal Problem:   Acute hypoxemic respiratory failure- h/o COPD and lung CA s/p radiation and on chemo - CT chest reveals known mass/ nodule in LUL which is unchanged, extensive interstitial opacities(increased from prior images) with sparing in LUL - uncertain at this time if this is an infection vs lymphangitic spread or side effect of chemo - PCCM asssiting with management - still on 13 L nasal cannula - spoke with PPCM- high on the differential is toxicity from chemo vs lymphangitic spread of cancer -cont Prednisone, Zosyn, Nebs, Singulair - 2 D ECHO does not reveal evidence of underlying CHF  Active Problems:  Lactic acidosis Due to above- resolved  Primary cancer of left upper lobe of lung  - non- small cell, metastatic to T4 - diagnosed in 2/17 - s/p radiation and 4 cycles of Pembrolizumab by Dr Marin Olp - last chemo 4/19 with plans to repeat in 3 wks  T4 lytic lesion- Metastasis - no collapse in vertebra  DM (diabetes mellitus), type 2  - last A1c 6.7 on 5/2  Essential hypertension - Verapamil     DVT prophylaxis: Lovenox Code Status: DNR Family  Communication:   Disposition Plan:  SDU while on high level of O2   Consultants:   PCCM  oncology  Procedures:   none  Antimicrobials:  Anti-infectives    Start     Dose/Rate Route Frequency Ordered Stop   11/26/15 0400  piperacillin-tazobactam (ZOSYN) IVPB 3.375 g  Status:  Discontinued     3.375 g 100 mL/hr over 30 Minutes Intravenous Every 8 hours 11/26/15 0203 11/26/15 0213   11/26/15 0400  piperacillin-tazobactam (ZOSYN) IVPB 3.375 g     3.375 g 12.5 mL/hr over 240 Minutes Intravenous Every 8 hours 11/26/15 0213     11/25/15 2015  vancomycin (VANCOCIN) IVPB 1000 mg/200 mL premix     1,000 mg 200 mL/hr over 60 Minutes Intravenous  Once 11/25/15 2001 11/25/15 2137   11/25/15 2015  piperacillin-tazobactam (ZOSYN) IVPB 3.375 g     3.375 g 100 mL/hr over 30 Minutes Intravenous  Once 11/25/15 2001 11/25/15 2051      Subjective: Still has severe dyspnea when she moves around. No cough or chest pain. No nausea or vomiting. Has a good appetite.   Objective: Filed Vitals:   11/28/15 0300 11/28/15 0400 11/28/15 0800 11/28/15 0952  BP:  117/76    Pulse:  92    Temp: 98.5 F (36.9 C)  97.9 F (36.6 C)   TempSrc: Oral  Oral   Resp:  19    Height:      Weight:      SpO2:  96%  91%    Intake/Output Summary (Last  24 hours) at 11/28/15 1018 Last data filed at 11/28/15 0900  Gross per 24 hour  Intake    180 ml  Output   4675 ml  Net  -4495 ml   Filed Weights   11/26/15 0209  Weight: 76.4 kg (168 lb 6.9 oz)    Examination: General exam: Appears comfortable  HEENT: PERRLA, oral mucosa moist, no sclera icterus or thrush Respiratory system: crackles in LLL- pulse ox 94% on 13 L O2 Cardiovascular system: S1 & S2 heard, RRR.  No murmurs  Gastrointestinal system: Abdomen soft, non-tender, nondistended. Normal bowel sound. No organomegaly Central nervous system: Alert and oriented. No focal neurological deficits. Extremities: No cyanosis, clubbing or edema Skin: No  rashes or ulcers Psychiatry:  Mood & affect appropriate.     Data Reviewed: I have personally reviewed following labs and imaging studies  CBC:  Recent Labs Lab 11/25/15 1936 11/25/15 2011 11/26/15 0254 11/27/15 0308  WBC 12.0*  --  10.1 13.6*  NEUTROABS 11.4*  --   --   --   HGB 12.4 13.6 11.2* 10.5*  HCT 37.1 40.0 33.2* 31.9*  MCV 96.1  --  96.5 98.5  PLT 201  --  176 941   Basic Metabolic Panel:  Recent Labs Lab 11/25/15 2011 11/25/15 2040 11/26/15 0254 11/27/15 0308 11/28/15 0314  NA 131* 135 135 137  --   K 6.2* 4.1 4.3 3.7  --   CL 101 102 105 103  --   CO2  --  18* 18* 22  --   GLUCOSE 274* 286* 282* 187*  --   BUN 23* 16 14 21*  --   CREATININE 0.60 0.86 0.65 0.83  --   CALCIUM  --  9.2 8.8* 8.2*  --   MG  --   --  2.1  --  2.0  PHOS  --   --  3.1  --  4.0   GFR: Estimated Creatinine Clearance: 76.7 mL/min (by C-G formula based on Cr of 0.83). Liver Function Tests:  Recent Labs Lab 11/25/15 2040 11/26/15 0254  AST 40 37  ALT 31 27  ALKPHOS 83 74  BILITOT 1.1 0.6  PROT 7.3 6.8  ALBUMIN 3.5 3.2*   No results for input(s): LIPASE, AMYLASE in the last 168 hours. No results for input(s): AMMONIA in the last 168 hours. Coagulation Profile: No results for input(s): INR, PROTIME in the last 168 hours. Cardiac Enzymes:  Recent Labs Lab 11/28/15 0314  TROPONINI <0.03   BNP (last 3 results) No results for input(s): PROBNP in the last 8760 hours. HbA1C:  Recent Labs  11/26/15 0254  HGBA1C 6.7*   CBG:  Recent Labs Lab 11/27/15 0742 11/27/15 1219 11/27/15 1614 11/27/15 2005 11/28/15 0838  GLUCAP 121* 112* 214* 203* 174*   Lipid Profile: No results for input(s): CHOL, HDL, LDLCALC, TRIG, CHOLHDL, LDLDIRECT in the last 72 hours. Thyroid Function Tests:  Recent Labs  11/26/15 0254  TSH 0.857   Anemia Panel: No results for input(s): VITAMINB12, FOLATE, FERRITIN, TIBC, IRON, RETICCTPCT in the last 72 hours. Urine analysis:      Component Value Date/Time   COLORURINE YELLOW 11/26/2015 0203   APPEARANCEUR CLEAR 11/26/2015 0203   LABSPEC >1.046* 11/26/2015 0203   PHURINE 5.0 11/26/2015 0203   GLUCOSEU >1000* 11/26/2015 0203   HGBUR NEGATIVE 11/26/2015 0203   BILIRUBINUR NEGATIVE 11/26/2015 0203   KETONESUR NEGATIVE 11/26/2015 0203   PROTEINUR 30* 11/26/2015 0203   NITRITE NEGATIVE 11/26/2015 0203   LEUKOCYTESUR  NEGATIVE 11/26/2015 0203   Sepsis Labs: '@LABRCNTIP'$ (procalcitonin:4,lacticidven:4)  ) Recent Results (from the past 240 hour(s))  Blood culture (routine x 2)     Status: None (Preliminary result)   Collection Time: 11/25/15  8:07 PM  Result Value Ref Range Status   Specimen Description BLOOD RIGHT HAND  Final   Special Requests BOTTLES DRAWN AEROBIC AND ANAEROBIC 10 ML  Final   Culture   Final    NO GROWTH 2 DAYS Performed at [facility]    Report Status PENDING  Incomplete  Blood culture (routine x 2)     Status: None (Preliminary result)   Collection Time: 11/25/15  8:07 PM  Result Value Ref Range Status   Specimen Description BLOOD LEFT HAND  Final   Special Requests BOTTLES DRAWN AEROBIC AND ANAEROBIC 10 ML  Final   Culture   Final    NO GROWTH 2 DAYS Performed at [facility]    Report Status PENDING  Incomplete  MRSA PCR Screening     Status: None   Collection Time: 11/26/15  1:58 AM  Result Value Ref Range Status   MRSA by PCR NEGATIVE NEGATIVE Final    Comment:        The GeneXpert MRSA Assay (FDA approved for NASAL specimens only), is one component of a comprehensive MRSA colonization surveillance program. It is not intended to diagnose MRSA infection nor to guide or monitor treatment for MRSA infections.   Respiratory virus panel     Status: None   Collection Time: 11/26/15  2:19 AM  Result Value Ref Range Status   Respiratory Syncytial Virus A Negative Negative Final   Respiratory Syncytial Virus B Negative Negative Final   Influenza A Negative  Negative Final   Influenza B Negative Negative Final   Parainfluenza 1 Negative Negative Final   Parainfluenza 2 Negative Negative Final   Parainfluenza 3 Negative Negative Final   Metapneumovirus Negative Negative Final   Rhinovirus Negative Negative Final   Adenovirus Negative Negative Final    Comment: (NOTE) Performed At: [facility] [place], [place] [id number] Lindon Romp MD BP:[id number]          Radiology Studies: Dg Chest Port 1 View  11/28/2015  CLINICAL DATA:  Respiratory failure, hypoxia, history of lung malignancy, COPD, diabetes. EXAM: PORTABLE CHEST 1 VIEW COMPARISON:  Chest x-ray and chest CT scan of Nov 25, 2015 FINDINGS: The lungs are well-expanded. There is persistent subtle increased density in the right lower lung just above the hemidiaphragm. On the left the retrocardiac region is slightly less dense. There is no large pleural effusion and no pneumothorax. The cardiac silhouette is top-normal in size. The pulmonary vascularity is normal. The bony thorax is unremarkable. IMPRESSION: Slight interval improvement in the appearance of the interstitial processes at the right lung base and left mid and lower lung. There is no evidence of pulmonary edema. The known left upper lobe mass is not clearly evident on today's study. Electronically Signed   By: David  Martinique M.D.   On: 11/28/2015 07:36        Scheduled Meds: . DULoxetine  60 mg Oral Daily  . enoxaparin (LOVENOX) injection  40 mg Subcutaneous QHS  . fluticasone  2 spray Each Nare Daily  . furosemide  40 mg Intravenous Q8H  . insulin aspart  0-15 Units Subcutaneous TID WC  . insulin aspart  0-5 Units Subcutaneous QHS  . ipratropium-albuterol  3 mL Nebulization Q6H  . methylPREDNISolone (  SOLU-MEDROL) injection  60 mg Intravenous Q6H  . pantoprazole  40 mg Oral Daily  . piperacillin-tazobactam (ZOSYN)  IV  3.375 g Intravenous Q8H  . polyethylene glycol  17 g Oral BID  . potassium  chloride  40 mEq Oral BID  . sodium chloride flush  3 mL Intravenous Q12H  . venlafaxine XR  150 mg Oral Daily  . verapamil  180 mg Oral Daily   Continuous Infusions:    LOS: 3 days    Time spent in minutes: [place], MD [facility] Pager: [email or web address] Password [email or web address] 11/28/2015, 10:18 AM

## 2015-11-28 NOTE — Progress Notes (Signed)
Pt currently not on BIPAP. Pt on 13 LPM HFNC. No distress noted at this time.

## 2015-11-29 ENCOUNTER — Ambulatory Visit: Payer: BLUE CROSS/BLUE SHIELD

## 2015-11-29 ENCOUNTER — Ambulatory Visit: Payer: BLUE CROSS/BLUE SHIELD | Admitting: Hematology & Oncology

## 2015-11-29 ENCOUNTER — Other Ambulatory Visit: Payer: BLUE CROSS/BLUE SHIELD

## 2015-11-29 DIAGNOSIS — G629 Polyneuropathy, unspecified: Secondary | ICD-10-CM

## 2015-11-29 LAB — BASIC METABOLIC PANEL
Anion gap: 15 (ref 5–15)
BUN: 31 mg/dL — AB (ref 6–20)
CALCIUM: 9.3 mg/dL (ref 8.9–10.3)
CO2: 27 mmol/L (ref 22–32)
CREATININE: 0.92 mg/dL (ref 0.44–1.00)
Chloride: 97 mmol/L — ABNORMAL LOW (ref 101–111)
Glucose, Bld: 187 mg/dL — ABNORMAL HIGH (ref 65–99)
Potassium: 4 mmol/L (ref 3.5–5.1)
SODIUM: 139 mmol/L (ref 135–145)

## 2015-11-29 LAB — CBC
HCT: 36.3 % (ref 36.0–46.0)
Hemoglobin: 11.9 g/dL — ABNORMAL LOW (ref 12.0–15.0)
MCH: 32.2 pg (ref 26.0–34.0)
MCHC: 32.8 g/dL (ref 30.0–36.0)
MCV: 98.4 fL (ref 78.0–100.0)
PLATELETS: 227 10*3/uL (ref 150–400)
RBC: 3.69 MIL/uL — ABNORMAL LOW (ref 3.87–5.11)
RDW: 14.9 % (ref 11.5–15.5)
WBC: 13.5 10*3/uL — ABNORMAL HIGH (ref 4.0–10.5)

## 2015-11-29 LAB — MAGNESIUM: MAGNESIUM: 2.4 mg/dL (ref 1.7–2.4)

## 2015-11-29 LAB — GLUCOSE, CAPILLARY
GLUCOSE-CAPILLARY: 194 mg/dL — AB (ref 65–99)
GLUCOSE-CAPILLARY: 181 mg/dL — AB (ref 65–99)
Glucose-Capillary: 238 mg/dL — ABNORMAL HIGH (ref 65–99)
Glucose-Capillary: 252 mg/dL — ABNORMAL HIGH (ref 65–99)

## 2015-11-29 LAB — PHOSPHORUS: PHOSPHORUS: 3.7 mg/dL (ref 2.5–4.6)

## 2015-11-29 MED ORDER — METHYLPREDNISOLONE SODIUM SUCC 125 MG IJ SOLR
60.0000 mg | Freq: Three times a day (TID) | INTRAMUSCULAR | Status: DC
  Administered 2015-11-29 – 2015-11-30 (×2): 60 mg via INTRAVENOUS
  Filled 2015-11-29 (×2): qty 2

## 2015-11-29 MED ORDER — CETYLPYRIDINIUM CHLORIDE 0.05 % MT LIQD
7.0000 mL | Freq: Two times a day (BID) | OROMUCOSAL | Status: DC
  Administered 2015-12-01: 7 mL via OROMUCOSAL

## 2015-11-29 MED ORDER — BISACODYL 5 MG PO TBEC
10.0000 mg | DELAYED_RELEASE_TABLET | Freq: Once | ORAL | Status: AC
  Administered 2015-11-29: 10 mg via ORAL
  Filled 2015-11-29: qty 2

## 2015-11-29 NOTE — Progress Notes (Signed)
Date:  Nov 29, 2015 Chart reviewed for concurrent status and case management needs. Will continue to follow patient for changes and needs: iv solumedrol and nebs, [address] BSN, [place], [place]

## 2015-11-29 NOTE — Progress Notes (Signed)
PROGRESS NOTE    Carol Buchanan  YYQ:[id number] DOB: 06-25-1961 DOA: 11/25/2015  PCP: Andria Frames, MD  Outpatient Specialists:  Lottie Rater Byrum--Pulmonary [place]--[facility]--Neurology    Brief Narrative:  55 year old female with a history of COPD, metastatic non-small cell lung cancer on radiation and chemotherapy started February 2017 (last chemo tx 11/13/15), presented with one-week history of worsening shortness of breath and constipation. The patient also complained of fevers and chills with a temperature up to 102.76F at home.   She contacted her medical oncologist on 11/25/2015 and was prescribed cefdinir in the AM but as she did not improve, she came in to the ER by EMS on the same evening. Pox checked by her neighbor was in 93s. Given solumedrol and Atrovent by EMS.  Assessment & Plan:   Principal Problem:   Acute hypoxemic respiratory failure- h/o COPD and lung CA s/p radiation and on chemo - CT chest reveals known mass/ nodule in LUL which is unchanged, extensive interstitial opacities(increased from prior images) with sparing in LUL - uncertain at this time if this is an infection vs lymphangitic spread or side effect of chemo - PCCM asssiting with management - still on 13 L nasal cannula- condition unchanged - spoke with PPCM- high on the differential is toxicity from chemo vs lymphangitic spread of cancer -cont Prednisone, Zosyn, Nebs, Singulair - 2 D ECHO does not reveal evidence of underlying CHF  Active Problems:  Lactic acidosis Due to above- resolved  Primary cancer of left upper lobe of lung  - non- small cell, metastatic to T4 - diagnosed in 2/17 - s/p radiation and 4 cycles of Pembrolizumab by Dr Marin Olp - last chemo 4/19 with plans to repeat in 3 wks - appreciate palliative care eval  T4 lytic lesion- Metastasis - no collapse in vertebra  DM (diabetes mellitus), type 2  - last A1c 6.7 on 5/2  Essential hypertension - Verapamil     Tremor/ anxiety from steroids - Propranolol and PRN diazepam started by palliative care  DVT prophylaxis: Lovenox Code Status: DNR Family Communication:   Disposition Plan:  SDU while on high level of O2   Consultants:   PCCM  oncology  Procedures:   none  Antimicrobials:  Anti-infectives    Start     Dose/Rate Route Frequency Ordered Stop   11/26/15 0400  piperacillin-tazobactam (ZOSYN) IVPB 3.375 g  Status:  Discontinued     3.375 g 100 mL/hr over 30 Minutes Intravenous Every 8 hours 11/26/15 0203 11/26/15 0213   11/26/15 0400  piperacillin-tazobactam (ZOSYN) IVPB 3.375 g  Status:  Discontinued     3.375 g 12.5 mL/hr over 240 Minutes Intravenous Every 8 hours 11/26/15 0213 11/28/15 1216   11/25/15 2015  vancomycin (VANCOCIN) IVPB 1000 mg/200 mL premix     1,000 mg 200 mL/hr over 60 Minutes Intravenous  Once 11/25/15 2001 11/25/15 2137   11/25/15 2015  piperacillin-tazobactam (ZOSYN) IVPB 3.375 g     3.375 g 100 mL/hr over 30 Minutes Intravenous  Once 11/25/15 2001 11/25/15 2051      Subjective: No BM since admission. Does not want dulcolax suppository but ok with oral dulcolax. States tremor of her hands is better. Still has severe dyspnea when she moves around. No cough or chest pain. No nausea or vomiting. Has a good appetite.   Objective: Filed Vitals:   11/29/15 0700 11/29/15 0800 11/29/15 0910 11/29/15 0912  BP:  127/65    Pulse:  95    Temp: 98.1  F (36.7 C) 97.7 F (36.5 C)    TempSrc: Oral Oral    Resp:  14    Height:      Weight:  74.9 kg (165 lb 2 oz)    SpO2:  94% 95% 95%    Intake/Output Summary (Last 24 hours) at 11/29/15 1104 Last data filed at 11/29/15 1018  Gross per 24 hour  Intake    860 ml  Output   1600 ml  Net   -740 ml   Filed Weights   11/26/15 0209 11/29/15 0800  Weight: 76.4 kg (168 lb 6.9 oz) 74.9 kg (165 lb 2 oz)    Examination: General exam: Appears comfortable  HEENT: PERRLA, oral mucosa moist, no sclera icterus  or thrush Respiratory system:  pulse ox 94% on 13 L O2 Cardiovascular system: S1 & S2 heard, RRR.  No murmurs  Gastrointestinal system: Abdomen soft, non-tender, nondistended. Normal bowel sound. No organomegaly Central nervous system: Alert and oriented. No focal neurological deficits. Extremities: No cyanosis, clubbing or edema Skin: No rashes or ulcers Psychiatry:  Mood & affect appropriate.     Data Reviewed: I have personally reviewed following labs and imaging studies  CBC:  Recent Labs Lab 11/25/15 1936 11/25/15 2011 11/26/15 0254 11/27/15 0308 11/29/15 0310  WBC 12.0*  --  10.1 13.6* 13.5*  NEUTROABS 11.4*  --   --   --   --   HGB 12.4 13.6 11.2* 10.5* 11.9*  HCT 37.1 40.0 33.2* 31.9* 36.3  MCV 96.1  --  96.5 98.5 98.4  PLT 201  --  176 171 350   Basic Metabolic Panel:  Recent Labs Lab 11/25/15 2011 11/25/15 2040 11/26/15 0254 11/27/15 0308 11/28/15 0314 11/29/15 0310  NA 131* 135 135 137  --  139  K 6.2* 4.1 4.3 3.7  --  4.0  CL 101 102 105 103  --  97*  CO2  --  18* 18* 22  --  27  GLUCOSE 274* 286* 282* 187*  --  187*  BUN 23* 16 14 21*  --  31*  CREATININE 0.60 0.86 0.65 0.83  --  0.92  CALCIUM  --  9.2 8.8* 8.2*  --  9.3  MG  --   --  2.1  --  2.0 2.4  PHOS  --   --  3.1  --  4.0 3.7   GFR: Estimated Creatinine Clearance: 68.5 mL/min (by C-G formula based on Cr of 0.92). Liver Function Tests:  Recent Labs Lab 11/25/15 2040 11/26/15 0254  AST 40 37  ALT 31 27  ALKPHOS 83 74  BILITOT 1.1 0.6  PROT 7.3 6.8  ALBUMIN 3.5 3.2*   No results for input(s): LIPASE, AMYLASE in the last 168 hours. No results for input(s): AMMONIA in the last 168 hours. Coagulation Profile: No results for input(s): INR, PROTIME in the last 168 hours. Cardiac Enzymes:  Recent Labs Lab 11/28/15 0314  TROPONINI <0.03   BNP (last 3 results) No results for input(s): PROBNP in the last 8760 hours. HbA1C: No results for input(s): HGBA1C in the last 72  hours. CBG:  Recent Labs Lab 11/28/15 0838 11/28/15 1144 11/28/15 1630 11/28/15 2135 11/29/15 0813  GLUCAP 174* 246* 179* 286* 194*   Lipid Profile: No results for input(s): CHOL, HDL, LDLCALC, TRIG, CHOLHDL, LDLDIRECT in the last 72 hours. Thyroid Function Tests: No results for input(s): TSH, T4TOTAL, FREET4, T3FREE, THYROIDAB in the last 72 hours. Anemia Panel: No results for input(s): VITAMINB12, FOLATE,  FERRITIN, TIBC, IRON, RETICCTPCT in the last 72 hours. Urine analysis:    Component Value Date/Time   COLORURINE YELLOW 11/26/2015 0203   APPEARANCEUR CLEAR 11/26/2015 0203   LABSPEC >1.046* 11/26/2015 0203   PHURINE 5.0 11/26/2015 0203   GLUCOSEU >1000* 11/26/2015 0203   HGBUR NEGATIVE 11/26/2015 0203   BILIRUBINUR NEGATIVE 11/26/2015 0203   KETONESUR NEGATIVE 11/26/2015 0203   PROTEINUR 30* 11/26/2015 0203   NITRITE NEGATIVE 11/26/2015 0203   LEUKOCYTESUR NEGATIVE 11/26/2015 0203   Sepsis Labs: '@LABRCNTIP'$ (procalcitonin:4,lacticidven:4)  ) Recent Results (from the past 240 hour(s))  Blood culture (routine x 2)     Status: None (Preliminary result)   Collection Time: 11/25/15  8:07 PM  Result Value Ref Range Status   Specimen Description BLOOD RIGHT HAND  Final   Special Requests BOTTLES DRAWN AEROBIC AND ANAEROBIC 10 ML  Final   Culture   Final    NO GROWTH 3 DAYS Performed at [facility]    Report Status PENDING  Incomplete  Blood culture (routine x 2)     Status: None (Preliminary result)   Collection Time: 11/25/15  8:07 PM  Result Value Ref Range Status   Specimen Description BLOOD LEFT HAND  Final   Special Requests BOTTLES DRAWN AEROBIC AND ANAEROBIC 10 ML  Final   Culture   Final    NO GROWTH 3 DAYS Performed at [facility]    Report Status PENDING  Incomplete  MRSA PCR Screening     Status: None   Collection Time: 11/26/15  1:58 AM  Result Value Ref Range Status   MRSA by PCR NEGATIVE NEGATIVE Final    Comment:        The  GeneXpert MRSA Assay (FDA approved for NASAL specimens only), is one component of a comprehensive MRSA colonization surveillance program. It is not intended to diagnose MRSA infection nor to guide or monitor treatment for MRSA infections.   Respiratory virus panel     Status: None   Collection Time: 11/26/15  2:19 AM  Result Value Ref Range Status   Respiratory Syncytial Virus A Negative Negative Final   Respiratory Syncytial Virus B Negative Negative Final   Influenza A Negative Negative Final   Influenza B Negative Negative Final   Parainfluenza 1 Negative Negative Final   Parainfluenza 2 Negative Negative Final   Parainfluenza 3 Negative Negative Final   Metapneumovirus Negative Negative Final   Rhinovirus Negative Negative Final   Adenovirus Negative Negative Final    Comment: (NOTE) Performed At: [facility] [place], [place] [id number] Lindon Romp MD VH:[id number]          Radiology Studies: Dg Chest Port 1 View  11/28/2015  CLINICAL DATA:  Respiratory failure, hypoxia, history of lung malignancy, COPD, diabetes. EXAM: PORTABLE CHEST 1 VIEW COMPARISON:  Chest x-ray and chest CT scan of Nov 25, 2015 FINDINGS: The lungs are well-expanded. There is persistent subtle increased density in the right lower lung just above the hemidiaphragm. On the left the retrocardiac region is slightly less dense. There is no large pleural effusion and no pneumothorax. The cardiac silhouette is top-normal in size. The pulmonary vascularity is normal. The bony thorax is unremarkable. IMPRESSION: Slight interval improvement in the appearance of the interstitial processes at the right lung base and left mid and lower lung. There is no evidence of pulmonary edema. The known left upper lobe mass is not clearly evident on today's study. Electronically Signed   By: Shanon Brow  Martinique M.D.   On: 11/28/2015 07:36        Scheduled Meds: . DULoxetine  60 mg Oral Daily  .  enoxaparin (LOVENOX) injection  40 mg Subcutaneous QHS  . fluticasone  2 spray Each Nare Daily  . gabapentin  600 mg Oral QHS  . insulin aspart  0-15 Units Subcutaneous TID WC  . insulin aspart  0-5 Units Subcutaneous QHS  . ipratropium-albuterol  3 mL Nebulization Q6H  . methylPREDNISolone (SOLU-MEDROL) injection  40 mg Intravenous Q12H  . pantoprazole  40 mg Oral Daily  . polyethylene glycol  17 g Oral BID  . sodium chloride flush  3 mL Intravenous Q12H  . verapamil  180 mg Oral Daily   Continuous Infusions:    LOS: 4 days    Time spent in minutes: [place], MD [facility] Pager: [email or web address] Password [email or web address] 11/29/2015, 11:04 AM

## 2015-11-29 NOTE — Progress Notes (Signed)
Initial Nutrition Assessment  DOCUMENTATION CODES:   Not applicable  INTERVENTION:  - Continue to monitor for additional education-related needs - RD will monitor for additional needs at follow-up  NUTRITION DIAGNOSIS:   Altered nutrition lab value related to cancer and cancer related treatments, other (see comment) (high-dose steroids) as evidenced by other (see comment) (HgbA1c: 6.7%).  GOAL:   Patient will meet greater than or equal to 90% of their needs  MONITOR:   PO intake, Weight trends, Labs, I & O's  REASON FOR ASSESSMENT:   Consult Diet education  ASSESSMENT:   55 y.o. female with medical history significant of metastatic poorly differentiated carcinoma left lung status post radiation therapy  Pt seen for consult for diet education related to steroid-induced hyperglycemia with HgbA1c on 11/25/15 of 6.7%. Pt states that she has been on high-dose steroids for 3 months following cancer dx in November 2016. During the past 3 months she has experienced ravenous appetite and associated 10 lb weight gain, per her report. Prior to starting steroids she had no issues related to blood sugar/glycemic control but has now required insulin injections BID or TID. Pt states that she has not been provided with diet education prior to today. She states that appetite today has been good and that for lunch she had corn, collard greens, and pinto beans and feels satisfied at this time.   She states that she is vaguely familiar with carbohydrates so this topic was discussed as far as mechanism and foods that contain carbohydrates. Provided pt with handout outlining low carbohydrate and no carbohydrate foods and reviewed this with her. Also provided her with handout outlining high protein foods and reviewed this. Encouraged pt to consume protein at each meal and snack to elicit improvement in satiety.   Pt very appreciative of information and handouts and states plan to focus on healthier eating  after d/c; she states she was previously "pigging out" on items such as potato chips and Cheetos. Re-iterated the role of protein with each meal and snack to assist in not over eating.   RD will continue to follow until d/c and monitor for additional needs. Medications reviewed; 60 mg IV Solu-medrol every 8 hours, sliding scale Novolog, . Labs reviewed; CBGs: 174-286 mg/dL today, Cl: 97 mmol/L, BUN: 31 mg/dL.   Diet Order:  Diet Carb Modified Fluid consistency:: Thin; Room service appropriate?: Yes  Skin:  Reviewed, no issues  Last BM:  5/1  Height:   Ht Readings from Last 1 Encounters:  11/26/15 '5\' 4"'$  (1.626 m)    Weight:   Wt Readings from Last 1 Encounters:  11/29/15 165 lb 2 oz (74.9 kg)    Ideal Body Weight:  54.54 kg (kg)  BMI:  Body mass index is 28.33 kg/(m^2).  Estimated Nutritional Needs:   Kcal:  2250-2470 (30-33 kcal/kg)  Protein:  105-120 grams (1.4-1.6 grams/kg)  Fluid:  >/= 2 L/day  EDUCATION NEEDS:   Education needs addressed    Jarome Matin, RD, LDN Inpatient Clinical Dietitian Pager # [phone] After hours/weekend pager # [phone]

## 2015-11-29 NOTE — Progress Notes (Signed)
PULMONARY / CRITICAL CARE MEDICINE   Name: Carol Buchanan MRN: [id number] DOB: January 03, 1961    ADMISSION DATE:  11/25/2015 CONSULTATION DATE:  11/29/2015   REFERRING MD:  Dr Shanon Brow Tat  CHIEF COMPLAINT:  Acute resp failure hypoxemic BRIEF - 55 year old female follows for copd nos with dr Lamonte Sakai but not on home o2 and Dr Marin Olp for metastatic non-small cell lung cancer. Currently on immunotherapy PD-1 antibody keytrudea. She was admitted 11/25/2015. She had worsening shortness of breath with the results of fever and chills with a temperature of 102 Fahrenheit at home. She was noticed to be hypoxemic at home and then admitted. She did not respond to outpatient cephalosporin. In the hospital CT angiogram ruled out pulmonary embolism but showed worsening diffuse groundglass opacities  Off note she has finished 4 cycles of ketyrida. PET scan 11/04/2015  showed a positive response with decrease in the left upper lobe mass according to the oncology notes. She also had resolution of lower lobe atelectasis but it did show groundglass opacities . This was new compared to CT scan at the time of diagnosis of cancer in end of 2016. It appears that she also had a CT angiogram chest 3 days after the PET scan on 11/07/2015 that continued to show groundglass opacities. Currently 11/25/2015 the ground glass opacities are worse.   Patient is now on 13 L nasal cannula and this is a new hypoxemia for her    SUBJECTIVE/OVERNIGHT/INTERVAL HX 5.5.17 - feeling better. Down to 92 NL [place]. Sitting on chair andnot desats while talking or reading news paper   VITAL SIGNS: BP 127/65 mmHg  Pulse 95  Temp(Src) 97.7 F (36.5 C) (Oral)  Resp 14  Ht '5\' 4"'$  (1.626 m)  Wt 74.9 kg (165 lb 2 oz)  BMI 28.33 kg/m2  SpO2 95% 10 liters  HEMODYNAMICS:    VENTILATOR SETTINGS:    INTAKE / OUTPUT: I/O last 3 completed shifts: In: 600 [P.O.:480; Other:20; IV Piggyback:100] Out: 3700 [Urine:3700]  PHYSICAL  EXAMINATION: Generalobese lady. Looks chronically unwell but fairly okay Neuro: Alert and oriented 3. Speech normal. Moves all 4 extremities.  HEENT:  Nasal cannula on. No elevated JVP. No neck nodes Cardiovascular:  Regular rate and rhythm. No murmurs  Lungs:  clear to auscultation bilaterally anteriorly but basal crackles present. Accessory muscles use is largely improved  Musculoskeletal:  Trace edema present both Skin exam: Intact in the exposed areas  ABS: PULMONARY  Recent Labs Lab 11/25/15 2011 11/25/15 2143  PHART  --  7.400  PCO2ART  --  27.0*  PO2ART  --  63.3*  HCO3  --  16.5*  TCO2 22 15.0  O2SAT  --  90.8    CBC  Recent Labs Lab 11/26/15 0254 11/27/15 0308 11/29/15 0310  HGB 11.2* 10.5* 11.9*  HCT 33.2* 31.9* 36.3  WBC 10.1 13.6* 13.5*  PLT 176 171 227    COAGULATION No results for input(s): INR in the last 168 hours.  CARDIAC    Recent Labs Lab 11/28/15 0314  TROPONINI <0.03   No results for input(s): PROBNP in the last 168 hours.   CHEMISTRY  Recent Labs Lab 11/25/15 2011 11/25/15 2040 11/26/15 0254 11/27/15 0308 11/28/15 0314 11/29/15 0310  NA 131* 135 135 137  --  139  K 6.2* 4.1 4.3 3.7  --  4.0  CL 101 102 105 103  --  97*  CO2  --  18* 18* 22  --  27  GLUCOSE 274* 286*  282* 187*  --  187*  BUN 23* 16 14 21*  --  31*  CREATININE 0.60 0.86 0.65 0.83  --  0.92  CALCIUM  --  9.2 8.8* 8.2*  --  9.3  MG  --   --  2.1  --  2.0 2.4  PHOS  --   --  3.1  --  4.0 3.7   Estimated Creatinine Clearance: 68.5 mL/min (by C-G formula based on Cr of 0.92).   LIVER  Recent Labs Lab 11/25/15 2040 11/26/15 0254  AST 40 37  ALT 31 27  ALKPHOS 83 74  BILITOT 1.1 0.6  PROT 7.3 6.8  ALBUMIN 3.5 3.2*     INFECTIOUS  Recent Labs Lab 11/26/15 0254  11/27/15 0308 11/27/15 1238 11/28/15 0314  LATICACIDVEN  --   < > 2.9* 2.4* 2.0  PROCALCITON <0.10  --  <0.10  --  <0.10  < > = values in this interval not  displayed.   ENDOCRINE CBG (last 3)   Recent Labs  11/28/15 1630 11/28/15 2135 11/29/15 0813  GLUCAP 179* 286* 194*         IMAGING x48h  - image(s) personally visualized  -   highlighted in bold Dg Chest Port 1 View  11/28/2015  CLINICAL DATA:  Respiratory failure, hypoxia, history of lung malignancy, COPD, diabetes. EXAM: PORTABLE CHEST 1 VIEW COMPARISON:  Chest x-ray and chest CT scan of Nov 25, 2015 FINDINGS: The lungs are well-expanded. There is persistent subtle increased density in the right lower lung just above the hemidiaphragm. On the left the retrocardiac region is slightly less dense. There is no large pleural effusion and no pneumothorax. The cardiac silhouette is top-normal in size. The pulmonary vascularity is normal. The bony thorax is unremarkable. IMPRESSION: Slight interval improvement in the appearance of the interstitial processes at the right lung base and left mid and lower lung. There is no evidence of pulmonary edema. The known left upper lobe mass is not clearly evident on today's study. Electronically Signed   By: David  Martinique M.D.   On: 11/28/2015 07:36  PCXR w/ some improvement in aeration on 5/4 CODE    Code Status Orders        Start     Ordered   11/26/15 0204  Do not attempt resuscitation (DNR)   Continuous    Question Answer Comment  In the event of cardiac or respiratory ARREST Do not call a "code blue"   In the event of cardiac or respiratory ARREST Do not perform Intubation, CPR, defibrillation or ACLS   In the event of cardiac or respiratory ARREST Use medication by any route, position, wound care, and other measures to relive pain and suffering. May use oxygen, suction and manual treatment of airway obstruction as needed for comfort.      11/26/15 0203    Code Status History    Date Active Date Inactive Code Status Order ID Comments User Context   This patient has a current code status but no historical code status.      Anti-infectives    Start     Dose/Rate Route Frequency Ordered Stop   11/26/15 0400  piperacillin-tazobactam (ZOSYN) IVPB 3.375 g  Status:  Discontinued     3.375 g 100 mL/hr over 30 Minutes Intravenous Every 8 hours 11/26/15 0203 11/26/15 0213   11/26/15 0400  piperacillin-tazobactam (ZOSYN) IVPB 3.375 g  Status:  Discontinued     3.375 g 12.5 mL/hr over 240 Minutes Intravenous  Every 8 hours 11/26/15 0213 11/28/15 1216   11/25/15 2015  vancomycin (VANCOCIN) IVPB 1000 mg/200 mL premix     1,000 mg 200 mL/hr over 60 Minutes Intravenous  Once 11/25/15 2001 11/25/15 2137   11/25/15 2015  piperacillin-tazobactam (ZOSYN) IVPB 3.375 g     3.375 g 100 mL/hr over 30 Minutes Intravenous  Once 11/25/15 2001 11/25/15 2051       ASSESSMENT / PLAN:   Acute hypoxemic respiratory failure with infiltrates -   - Differential diagnosis is pulmonary toxicity from PD-1 antibody (most likely) v lymphangitis carcinomatosis baseline problems  - Current worsening could be due to above getting worse versus superimposed viral/HCAP. Alternatively diastolic heart failure  5/5. Most likely [place] v lympantghitis. Given improvement might be former  Plan:   Dc lasix (BUn rising) high-dose steroids - increase back to '60mg'$  Q8h Monitor off abx QHS BiPAP if she gets worse; no intubation  If no response to above, then candidate for surgical lung bx - she is high risk for complications for that. Reg: bronc/tbbx - will not withstand sedation + is not sensitive enough to make dx   Dr. Brand Males, M.D., [facility].C.P Pulmonary and Critical Care Medicine Staff Physician [facility] Pager: [phone], If no answer or between  15:00h - 7:00h: call [phone]  11/29/2015 12:14 PM

## 2015-11-30 ENCOUNTER — Inpatient Hospital Stay (HOSPITAL_COMMUNITY): Payer: Medicare Other

## 2015-11-30 DIAGNOSIS — C3410 Malignant neoplasm of upper lobe, unspecified bronchus or lung: Secondary | ICD-10-CM

## 2015-11-30 DIAGNOSIS — J189 Pneumonia, unspecified organism: Secondary | ICD-10-CM | POA: Insufficient documentation

## 2015-11-30 LAB — CULTURE, BLOOD (ROUTINE X 2)
CULTURE: NO GROWTH
Culture: NO GROWTH

## 2015-11-30 LAB — GLUCOSE, CAPILLARY
GLUCOSE-CAPILLARY: 152 mg/dL — AB (ref 65–99)
GLUCOSE-CAPILLARY: 248 mg/dL — AB (ref 65–99)
Glucose-Capillary: 159 mg/dL — ABNORMAL HIGH (ref 65–99)
Glucose-Capillary: 113 mg/dL — ABNORMAL HIGH (ref 65–99)

## 2015-11-30 LAB — PHOSPHORUS: Phosphorus: 4 mg/dL (ref 2.5–4.6)

## 2015-11-30 LAB — MAGNESIUM: MAGNESIUM: 2.4 mg/dL (ref 1.7–2.4)

## 2015-11-30 MED ORDER — BISACODYL 10 MG RE SUPP
10.0000 mg | Freq: Once | RECTAL | Status: AC
  Administered 2015-11-30: 10 mg via RECTAL
  Filled 2015-11-30: qty 1

## 2015-11-30 MED ORDER — INSULIN ASPART 100 UNIT/ML ~~LOC~~ SOLN
3.0000 [IU] | Freq: Three times a day (TID) | SUBCUTANEOUS | Status: DC
  Administered 2015-11-30 – 2015-12-02 (×6): 3 [IU] via SUBCUTANEOUS

## 2015-11-30 MED ORDER — METHYLPREDNISOLONE SODIUM SUCC 125 MG IJ SOLR
60.0000 mg | Freq: Two times a day (BID) | INTRAMUSCULAR | Status: DC
  Administered 2015-11-30 – 2015-12-02 (×4): 60 mg via INTRAVENOUS
  Filled 2015-11-30: qty 0.96
  Filled 2015-11-30 (×2): qty 2
  Filled 2015-11-30 (×3): qty 0.96

## 2015-11-30 NOTE — Progress Notes (Signed)
PROGRESS NOTE    Carol Buchanan  DJT:[id number] DOB: 13-Jan-1961 DOA: 11/25/2015  PCP: Andria Frames, MD  Outpatient Specialists:  [facility]--Pulmonary [place]--[facility]--Neurology    Brief Narrative:  55 year old female with a history of COPD, metastatic non-small cell lung cancer on radiation and chemotherapy started February 2017 (last chemo tx 11/13/15), presented with one-week history of worsening shortness of breath and constipation. The patient also complained of fevers and chills with a temperature up to 102.35F at home.   She contacted her medical oncologist on 11/25/2015 and was prescribed cefdinir in the AM but as she did not improve, she came in to the ER by EMS on the same evening. Pox checked by her neighbor was in 53s. Given solumedrol and Atrovent by EMS.  Assessment & Plan:   Principal Problem:   Acute hypoxemic respiratory failure- h/o COPD and lung CA s/p radiation and on chemo - CT chest reveals known mass/ nodule in LUL which is unchanged, extensive interstitial opacities(increased from prior images) with sparing in LUL - PCCM asssiting with management - weaned down to 6 L nasal cannula- moving around a bit more- likely has toxicity from chemo which is improving with steroids  - 2 D ECHO does not reveal evidence of underlying CHF- has diuresed 6 L - Resp viral panel negative - management per pulmonary  Active Problems:  Lactic acidosis Due to above- resolved  Primary cancer of left upper lobe of lung  - non- small cell, metastatic to T4 - diagnosed in 2/17 - s/p radiation and 4 cycles of Pembrolizumab by Dr Marin Olp - last chemo 4/19 with plans to repeat in 3 wks - appreciate palliative care eval  T4 lytic lesion- Metastasis - no collapse in vertebra  DM (diabetes mellitus), type 2  - last A1c 6.7 on 5/2  Essential hypertension - Verapamil    Tremor/ anxiety from steroids - Propranolol and PRN diazepam started by palliative  care  DVT prophylaxis: Lovenox Code Status: DNR Family Communication:   Disposition Plan:  SDU while on high level of O2   Consultants:   PCCM  oncology  Procedures:   none  Antimicrobials:  Anti-infectives    Start     Dose/Rate Route Frequency Ordered Stop   11/26/15 0400  piperacillin-tazobactam (ZOSYN) IVPB 3.375 g  Status:  Discontinued     3.375 g 100 mL/hr over 30 Minutes Intravenous Every 8 hours 11/26/15 0203 11/26/15 0213   11/26/15 0400  piperacillin-tazobactam (ZOSYN) IVPB 3.375 g  Status:  Discontinued     3.375 g 12.5 mL/hr over 240 Minutes Intravenous Every 8 hours 11/26/15 0213 11/28/15 1216   11/25/15 2015  vancomycin (VANCOCIN) IVPB 1000 mg/200 mL premix     1,000 mg 200 mL/hr over 60 Minutes Intravenous  Once 11/25/15 2001 11/25/15 2137   11/25/15 2015  piperacillin-tazobactam (ZOSYN) IVPB 3.375 g     3.375 g 100 mL/hr over 30 Minutes Intravenous  Once 11/25/15 2001 11/25/15 2051      Subjective: No BM with oral dulcolax. OK with dulcolax suppository today. Breathing is a bit better. No new complaints.   Objective: Filed Vitals:   11/30/15 0900 11/30/15 1000 11/30/15 1100 11/30/15 1110  BP:      Pulse: 100 95 116 92  Temp:      TempSrc:      Resp: '19 19 31 17  '$ Height:      Weight:      SpO2: 92% 95% 84% 92%  Intake/Output Summary (Last 24 hours) at 11/30/15 1234 Last data filed at 11/30/15 1100  Gross per 24 hour  Intake   1070 ml  Output   1600 ml  Net   -530 ml   Filed Weights   11/26/15 0209 11/29/15 0800 11/30/15 0500  Weight: 76.4 kg (168 lb 6.9 oz) 74.9 kg (165 lb 2 oz) 75.2 kg (165 lb 12.6 oz)    Examination: General exam: Appears comfortable  HEENT: PERRLA, oral mucosa moist, no sclera icterus or thrush Respiratory system:  pulse ox 94% on 13 L O2 Cardiovascular system: S1 & S2 heard, RRR.  No murmurs  Gastrointestinal system: Abdomen soft, non-tender, nondistended. Normal bowel sound. No organomegaly Central nervous  system: Alert and oriented. No focal neurological deficits. Extremities: No cyanosis, clubbing or edema Skin: No rashes or ulcers Psychiatry:  Mood & affect appropriate.     Data Reviewed: I have personally reviewed following labs and imaging studies  CBC:  Recent Labs Lab 11/25/15 1936 11/25/15 2011 11/26/15 0254 11/27/15 0308 11/29/15 0310  WBC 12.0*  --  10.1 13.6* 13.5*  NEUTROABS 11.4*  --   --   --   --   HGB 12.4 13.6 11.2* 10.5* 11.9*  HCT 37.1 40.0 33.2* 31.9* 36.3  MCV 96.1  --  96.5 98.5 98.4  PLT 201  --  176 171 542   Basic Metabolic Panel:  Recent Labs Lab 11/25/15 2011 11/25/15 2040 11/26/15 0254 11/27/15 0308 11/28/15 0314 11/29/15 0310 11/30/15 0322  NA 131* 135 135 137  --  139  --   K 6.2* 4.1 4.3 3.7  --  4.0  --   CL 101 102 105 103  --  97*  --   CO2  --  18* 18* 22  --  27  --   GLUCOSE 274* 286* 282* 187*  --  187*  --   BUN 23* 16 14 21*  --  31*  --   CREATININE 0.60 0.86 0.65 0.83  --  0.92  --   CALCIUM  --  9.2 8.8* 8.2*  --  9.3  --   MG  --   --  2.1  --  2.0 2.4 2.4  PHOS  --   --  3.1  --  4.0 3.7 4.0   GFR: Estimated Creatinine Clearance: 68.6 mL/min (by C-G formula based on Cr of 0.92). Liver Function Tests:  Recent Labs Lab 11/25/15 2040 11/26/15 0254  AST 40 37  ALT 31 27  ALKPHOS 83 74  BILITOT 1.1 0.6  PROT 7.3 6.8  ALBUMIN 3.5 3.2*   No results for input(s): LIPASE, AMYLASE in the last 168 hours. No results for input(s): AMMONIA in the last 168 hours. Coagulation Profile: No results for input(s): INR, PROTIME in the last 168 hours. Cardiac Enzymes:  Recent Labs Lab 11/28/15 0314  TROPONINI <0.03   BNP (last 3 results) No results for input(s): PROBNP in the last 8760 hours. HbA1C: No results for input(s): HGBA1C in the last 72 hours. CBG:  Recent Labs Lab 11/29/15 0813 11/29/15 1141 11/29/15 1653 11/29/15 2110 11/30/15 0805  GLUCAP 194* 181* 238* 252* 159*   Lipid Profile: No results for  input(s): CHOL, HDL, LDLCALC, TRIG, CHOLHDL, LDLDIRECT in the last 72 hours. Thyroid Function Tests: No results for input(s): TSH, T4TOTAL, FREET4, T3FREE, THYROIDAB in the last 72 hours. Anemia Panel: No results for input(s): VITAMINB12, FOLATE, FERRITIN, TIBC, IRON, RETICCTPCT in the last 72 hours. Urine analysis:  Component Value Date/Time   COLORURINE YELLOW 11/26/2015 0203   APPEARANCEUR CLEAR 11/26/2015 0203   LABSPEC >1.046* 11/26/2015 0203   PHURINE 5.0 11/26/2015 0203   GLUCOSEU >1000* 11/26/2015 0203   HGBUR NEGATIVE 11/26/2015 0203   BILIRUBINUR NEGATIVE 11/26/2015 0203   KETONESUR NEGATIVE 11/26/2015 0203   PROTEINUR 30* 11/26/2015 0203   NITRITE NEGATIVE 11/26/2015 0203   LEUKOCYTESUR NEGATIVE 11/26/2015 0203   Sepsis Labs: '@LABRCNTIP'$ (procalcitonin:4,lacticidven:4)  ) Recent Results (from the past 240 hour(s))  Blood culture (routine x 2)     Status: None (Preliminary result)   Collection Time: 11/25/15  8:07 PM  Result Value Ref Range Status   Specimen Description BLOOD RIGHT HAND  Final   Special Requests BOTTLES DRAWN AEROBIC AND ANAEROBIC 10 ML  Final   Culture   Final    NO GROWTH 4 DAYS Performed at [facility]    Report Status PENDING  Incomplete  Blood culture (routine x 2)     Status: None (Preliminary result)   Collection Time: 11/25/15  8:07 PM  Result Value Ref Range Status   Specimen Description BLOOD LEFT HAND  Final   Special Requests BOTTLES DRAWN AEROBIC AND ANAEROBIC 10 ML  Final   Culture   Final    NO GROWTH 4 DAYS Performed at [facility]    Report Status PENDING  Incomplete  MRSA PCR Screening     Status: None   Collection Time: 11/26/15  1:58 AM  Result Value Ref Range Status   MRSA by PCR NEGATIVE NEGATIVE Final    Comment:        The GeneXpert MRSA Assay (FDA approved for NASAL specimens only), is one component of a comprehensive MRSA colonization surveillance program. It is not intended to diagnose  MRSA infection nor to guide or monitor treatment for MRSA infections.   Respiratory virus panel     Status: None   Collection Time: 11/26/15  2:19 AM  Result Value Ref Range Status   Respiratory Syncytial Virus A Negative Negative Final   Respiratory Syncytial Virus B Negative Negative Final   Influenza A Negative Negative Final   Influenza B Negative Negative Final   Parainfluenza 1 Negative Negative Final   Parainfluenza 2 Negative Negative Final   Parainfluenza 3 Negative Negative Final   Metapneumovirus Negative Negative Final   Rhinovirus Negative Negative Final   Adenovirus Negative Negative Final    Comment: (NOTE) Performed At: [facility] [place], [place] [id number] Lindon Romp MD RK:[id number]          Radiology Studies: Dg Chest Port 1 View  11/30/2015  CLINICAL DATA:  Shortness of breath.  History of lung cancer EXAM: PORTABLE CHEST 1 VIEW COMPARISON:  11/28/2015 FINDINGS: Unchanged diffuse interstitial opacity with asymmetric involvement in the left mid and basilar lung. Normal heart size. No effusion or pneumothorax. IMPRESSION: Diffuse interstitial and ground-glass lung opacity by 11/25/2015 CT. Stable appearance since that time. Electronically Signed   By: Monte Fantasia M.D.   On: 11/30/2015 08:23        Scheduled Meds: . antiseptic oral rinse  7 mL Mouth Rinse BID  . DULoxetine  60 mg Oral Daily  . enoxaparin (LOVENOX) injection  40 mg Subcutaneous QHS  . fluticasone  2 spray Each Nare Daily  . gabapentin  600 mg Oral QHS  . insulin aspart  0-15 Units Subcutaneous TID WC  . insulin aspart  0-5 Units Subcutaneous QHS  . insulin aspart  3 Units Subcutaneous TID WC  . ipratropium-albuterol  3 mL Nebulization Q6H  . methylPREDNISolone (SOLU-MEDROL) injection  60 mg Intravenous Q12H  . pantoprazole  40 mg Oral Daily  . polyethylene glycol  17 g Oral BID  . sodium chloride flush  3 mL Intravenous Q12H  . verapamil  180 mg  Oral Daily   Continuous Infusions:    LOS: 5 days    Time spent in minutes: Willis, MD [facility] Pager: [email or web address] Password [facility] 11/30/2015, 12:34 PM

## 2015-11-30 NOTE — Progress Notes (Signed)
PULMONARY / CRITICAL CARE MEDICINE   Name: Carol Buchanan MRN: [id number] DOB: 07/08/1961    ADMISSION DATE:  11/25/2015 CONSULTATION DATE:  11/30/2015   REFERRING MD:  Dr Shanon Brow Tat  CHIEF COMPLAINT:  Acute resp failure hypoxemic BRIEF - 55 year old female follows for copd nos with dr Lamonte Sakai but not on home o2 and Dr Marin Olp for metastatic non-small cell lung cancer. Currently on immunotherapy PD-1 antibody keytrudea. She was admitted 11/25/2015. She had worsening shortness of breath with the results of fever and chills with a temperature of 102 Fahrenheit at home. She was noticed to be hypoxemic at home and then admitted. She did not respond to outpatient cephalosporin. In the hospital CT angiogram ruled out pulmonary embolism but showed worsening diffuse groundglass opacities  Off note she has finished 4 cycles of ketyrida. PET scan 11/04/2015  showed a positive response with decrease in the left upper lobe mass according to the oncology notes. She also had resolution of lower lobe atelectasis but it did show groundglass opacities . This was new compared to CT scan at the time of diagnosis of cancer in end of 2016. It appears that she also had a CT angiogram chest 3 days after the PET scan on 11/07/2015 that continued to show groundglass opacities. Currently 11/25/2015 the ground glass opacities are worse.    SUBJECTIVE/OVERNIGHT/INTERVAL HX Has continued to improve, now on 4L/min [place] and comfortable.    VITAL SIGNS: BP 122/67 mmHg  Pulse 93  Temp(Src) 98.2 F (36.8 C) (Oral)  Resp 18  Ht '5\' 4"'$  (1.626 m)  Wt 75.2 kg (165 lb 12.6 oz)  BMI 28.44 kg/m2  SpO2 95% 10 liters  HEMODYNAMICS:    VENTILATOR SETTINGS: Vent Mode:  [-]  FiO2 (%):  [95 %] 95 %  INTAKE / OUTPUT: I/O last 3 completed shifts: In: 2952 [P.O.:1790; Other:20] Out: 2500 [Urine:2500]  PHYSICAL EXAMINATION: General obese lady. Awake and comfortable Neuro: Alert and oriented 3. Speech normal. Moves all 4  extremities.  HEENT:  Nasal cannula on. No elevated JVP. No neck nodes Cardiovascular:  Regular rate and rhythm. No murmurs  Lungs:  clear to auscultation bilaterally anteriorly but basal crackles present. Accessory muscles use is largely improved  Musculoskeletal:  Trace edema present both Skin exam: Intact in the exposed areas  ABS: PULMONARY  Recent Labs Lab 11/25/15 2011 11/25/15 2143  PHART  --  7.400  PCO2ART  --  27.0*  PO2ART  --  63.3*  HCO3  --  16.5*  TCO2 22 15.0  O2SAT  --  90.8    CBC  Recent Labs Lab 11/26/15 0254 11/27/15 0308 11/29/15 0310  HGB 11.2* 10.5* 11.9*  HCT 33.2* 31.9* 36.3  WBC 10.1 13.6* 13.5*  PLT 176 171 227    COAGULATION No results for input(s): INR in the last 168 hours.  CARDIAC    Recent Labs Lab 11/28/15 0314  TROPONINI <0.03   No results for input(s): PROBNP in the last 168 hours.   CHEMISTRY  Recent Labs Lab 11/25/15 2011 11/25/15 2040 11/26/15 0254 11/27/15 0308 11/28/15 0314 11/29/15 0310 11/30/15 0322  NA 131* 135 135 137  --  139  --   K 6.2* 4.1 4.3 3.7  --  4.0  --   CL 101 102 105 103  --  97*  --   CO2  --  18* 18* 22  --  27  --   GLUCOSE 274* 286* 282* 187*  --  187*  --  BUN 23* 16 14 21*  --  31*  --   CREATININE 0.60 0.86 0.65 0.83  --  0.92  --   CALCIUM  --  9.2 8.8* 8.2*  --  9.3  --   MG  --   --  2.1  --  2.0 2.4 2.4  PHOS  --   --  3.1  --  4.0 3.7 4.0   Estimated Creatinine Clearance: 68.6 mL/min (by C-G formula based on Cr of 0.92).   LIVER  Recent Labs Lab 11/25/15 2040 11/26/15 0254  AST 40 37  ALT 31 27  ALKPHOS 83 74  BILITOT 1.1 0.6  PROT 7.3 6.8  ALBUMIN 3.5 3.2*     INFECTIOUS  Recent Labs Lab 11/26/15 0254  11/27/15 0308 11/27/15 1238 11/28/15 0314  LATICACIDVEN  --   < > 2.9* 2.4* 2.0  PROCALCITON <0.10  --  <0.10  --  <0.10  < > = values in this interval not displayed.   ENDOCRINE CBG (last 3)   Recent Labs  11/29/15 1653 11/29/15 2110  11/30/15 0805  GLUCAP 238* 252* 159*         IMAGING x48h  - image(s) personally visualized  -   highlighted in bold Dg Chest Port 1 View  11/30/2015  CLINICAL DATA:  Shortness of breath.  History of lung cancer EXAM: PORTABLE CHEST 1 VIEW COMPARISON:  11/28/2015 FINDINGS: Unchanged diffuse interstitial opacity with asymmetric involvement in the left mid and basilar lung. Normal heart size. No effusion or pneumothorax. IMPRESSION: Diffuse interstitial and ground-glass lung opacity by 11/25/2015 CT. Stable appearance since that time. Electronically Signed   By: Monte Fantasia M.D.   On: 11/30/2015 08:23  PCXR w/ some improvement in aeration on 5/4 CODE    Code Status Orders        Start     Ordered   11/26/15 0204  Do not attempt resuscitation (DNR)   Continuous    Question Answer Comment  In the event of cardiac or respiratory ARREST Do not call a "code blue"   In the event of cardiac or respiratory ARREST Do not perform Intubation, CPR, defibrillation or ACLS   In the event of cardiac or respiratory ARREST Use medication by any route, position, wound care, and other measures to relive pain and suffering. May use oxygen, suction and manual treatment of airway obstruction as needed for comfort.      11/26/15 0203    Code Status History    Date Active Date Inactive Code Status Order ID Comments User Context   This patient has a current code status but no historical code status.     Anti-infectives    Start     Dose/Rate Route Frequency Ordered Stop   11/26/15 0400  piperacillin-tazobactam (ZOSYN) IVPB 3.375 g  Status:  Discontinued     3.375 g 100 mL/hr over 30 Minutes Intravenous Every 8 hours 11/26/15 0203 11/26/15 0213   11/26/15 0400  piperacillin-tazobactam (ZOSYN) IVPB 3.375 g  Status:  Discontinued     3.375 g 12.5 mL/hr over 240 Minutes Intravenous Every 8 hours 11/26/15 0213 11/28/15 1216   11/25/15 2015  vancomycin (VANCOCIN) IVPB 1000 mg/200 mL premix     1,000  mg 200 mL/hr over 60 Minutes Intravenous  Once 11/25/15 2001 11/25/15 2137   11/25/15 2015  piperacillin-tazobactam (ZOSYN) IVPB 3.375 g     3.375 g 100 mL/hr over 30 Minutes Intravenous  Once 11/25/15 2001 11/25/15 2051  ASSESSMENT / PLAN:   Acute hypoxemic respiratory failure with infiltrates -   - Differential diagnosis is pulmonary toxicity from PD-1 antibody (most likely) v lymphangitis carcinomatosis baseline problems  - Consider component superimposed viral/HCAP. Alternatively diastolic heart failure   Plan:   Could diurese further if any decline in resp status high-dose steroids - should be able to begin to taper Monitor off abx  We will check on her on Monday. Call if we can assist sooner.     Baltazar Apo, MD, PhD 11/30/2015, 10:20 AM [facility] [phone] or if no answer [phone]

## 2015-11-30 NOTE — Progress Notes (Signed)
Carol Buchanan looks good this morning. She feels better. Her oxygen level seems be getting a little better. She is out of bed to chair a lot yesterday.  She probably can have her steroids come back a little bit.  She does have another chest x-ray to see how this looks.  She's not coughing up anything. She's having no pain. Having no diarrhea.  On her physical exam, her vital signs are stable. Blood pressure is 123/54. Pulse is 85. Temperature 97.4. Head and neck exam shows no ocular or oral lesions. There is no thrush. Lungs show very good air movement bilaterally. No wheezes are noted. Cardiac exam regular rate and rhythm with no murmurs, rubs or bruits. Abdomen is soft. Bowel sounds are present. Extremities shows no clubbing, cyanosis or edema.  Is still not clear as to what the etiology of this lung inflammation is. I don't know if this is infectious or toxicity from the pembrolizumab or even malignancy. All of her viral studies came back normal. Blood cultures are normal.  She had an echocardiogram. This was normal.  We will see her another chest x-ray looks like.  Again we will see back in back on her steroids.  I would like to think that she will be moved out of the ICU today.  I am incredibly grateful for the outstanding care that she is gotten from the staff in the ICU!!  Lum Keas

## 2015-12-01 DIAGNOSIS — C3410 Malignant neoplasm of upper lobe, unspecified bronchus or lung: Secondary | ICD-10-CM

## 2015-12-01 DIAGNOSIS — J189 Pneumonia, unspecified organism: Principal | ICD-10-CM

## 2015-12-01 LAB — GLUCOSE, CAPILLARY
GLUCOSE-CAPILLARY: 146 mg/dL — AB (ref 65–99)
Glucose-Capillary: 106 mg/dL — ABNORMAL HIGH (ref 65–99)
Glucose-Capillary: 234 mg/dL — ABNORMAL HIGH (ref 65–99)
Glucose-Capillary: 250 mg/dL — ABNORMAL HIGH (ref 65–99)

## 2015-12-01 LAB — PHOSPHORUS: PHOSPHORUS: 4.5 mg/dL (ref 2.5–4.6)

## 2015-12-01 LAB — MAGNESIUM: MAGNESIUM: 2.6 mg/dL — AB (ref 1.7–2.4)

## 2015-12-01 NOTE — Evaluation (Signed)
Physical Therapy Evaluation Patient Details Name: Carol Buchanan MRN: [id number] DOB: [birth year] Today's Date: 12/01/2015   History of Present Illness  55 year old female with a history of COPD, metastatic non-small cell lung cancer on radiation and chemotherapy started February 2017 (last chemo tx 11/13/15), presented with one-week history of worsening shortness of breath and constipation  Clinical Impression  Patient evaluated by Physical Therapy with no further acute PT needs identified. All education has been completed and the patient has no further questions.  See below for any follow-up Physical Therapy or equipment needs. PT is signing off. Thank you for this referral. Pt on 5L O2 at rest;  amb 360' on 6L O2, ambulatory sats down to 86%, recovered to >92% with standing rest and pursed lip breathing; no further needs;  Recommend pt  amb with nursing staff, will need sats monitored but does not need further PT    Follow Up Recommendations No PT follow up    Equipment Recommendations  None recommended by PT    Recommendations for Other Services       Precautions / Restrictions        Mobility  Bed Mobility               General bed mobility comments: pt in chair  Transfers Overall transfer level: Independent Equipment used: None Transfers: Sit to/from Stand              Ambulation/Gait Ambulation/Gait assistance: Independent Ambulation Distance (Feet): 360 Feet Assistive device: None Gait Pattern/deviations: WFL(Within Functional Limits)        Stairs            Wheelchair Mobility    Modified Rankin (Stroke Patients Only)       Balance Overall balance assessment: Independent (for activities tested)                                           Pertinent Vitals/Pain Pain Assessment: No/denies pain    Home Living Family/patient expects to be discharged to:: Private residence Living Arrangements: Parent (lives with  mother) Available Help at Discharge: Family             Additional Comments: pt states she and her Mom are independent and they help each other    Prior Function Level of Independence: Independent               Hand Dominance        Extremity/Trunk Assessment   Upper Extremity Assessment: Overall WFL for tasks assessed           Lower Extremity Assessment: Overall WFL for tasks assessed         Communication   Communication: No difficulties  Cognition Arousal/Alertness: Awake/alert Behavior During Therapy: WFL for tasks assessed/performed Overall Cognitive Status: Within Functional Limits for tasks assessed                      General Comments      Exercises        Assessment/Plan    PT Assessment Patent does not need any further PT services  PT Diagnosis Difficulty walking   PT Problem List    PT Treatment Interventions     PT Goals (Current goals can be found in the Care Plan section) Acute Rehab PT Goals PT Goal Formulation: All assessment and education complete, DC therapy  Frequency     Barriers to discharge        Co-evaluation               End of Session   Activity Tolerance: Patient tolerated treatment well Patient left: with call bell/phone within reach;in chair           Time: 5364-6803 PT Time Calculation (min) (ACUTE ONLY): 22 min   Charges:   PT Evaluation $PT Eval Low Complexity: 1 Procedure     PT G Codes:        Quadarius Henton 12-14-15, 3:19 PM

## 2015-12-01 NOTE — Progress Notes (Signed)
PROGRESS NOTE    Carol Buchanan  QTM:[id number] DOB: 1961/03/02 DOA: 11/25/2015  PCP: Andria Frames, MD  Outpatient Specialists:  [facility]--P[facility] [place]--[facility]--Neurology    Brief Narrative:  55 year old female with a history of COPD, metastatic non-small cell lung cancer on radiation and chemotherapy started February 2017 (last chemo tx 11/13/15), presented with one-week history of worsening shortness of breath and constipation. The patient also complained of fevers and chills with a temperature up to 102.60F at home.   She contacted her medical oncologist on 11/25/2015 and was prescribed cefdinir in the AM but as she did not improve, she came in to the ER by EMS on the same evening. Pox checked by her neighbor was in 81s. Given solumedrol and Atrovent by EMS.  Assessment & Plan:   Principal Problem:   Acute hypoxemic respiratory failure- h/o COPD and lung CA s/p radiation and on chemo - CT chest reveals known mass/ nodule in LUL which is unchanged, extensive interstitial opacities(increased from prior images) with sparing in LUL - PCCM asssiting with management - weaned down to 5 L nasal cannula- moving around a bit more- likely has toxicity from chemo which is improving with steroids  - 2 D ECHO does not reveal evidence of underlying CHF- has diuresed 6 L - Resp viral panel negative - management per pulmonary  Active Problems:  Lactic acidosis Due to above- resolved  Primary cancer of left upper lobe of lung  - non- small cell, metastatic to T4 - diagnosed in 2/17 - s/p radiation and 4 cycles of Pembrolizumab by Dr Marin Olp - last chemo 4/19 with plans to repeat in 3 wks - appreciate palliative care eval  T4 lytic lesion- Metastasis - no collapse in vertebra  DM (diabetes mellitus), type 2  - last A1c 6.7 on 5/2  Essential hypertension - Verapamil    Tremor/ anxiety from steroids - Propranolol and PRN diazepam started by palliative  care  DVT prophylaxis: Lovenox Code Status: DNR Family Communication:   Disposition Plan: tx to med surg   Consultants:   PCCM  oncology  Procedures:   none  Antimicrobials:  Anti-infectives    Start     Dose/Rate Route Frequency Ordered Stop   11/26/15 0400  piperacillin-tazobactam (ZOSYN) IVPB 3.375 g  Status:  Discontinued     3.375 g 100 mL/hr over 30 Minutes Intravenous Every 8 hours 11/26/15 0203 11/26/15 0213   11/26/15 0400  piperacillin-tazobactam (ZOSYN) IVPB 3.375 g  Status:  Discontinued     3.375 g 12.5 mL/hr over 240 Minutes Intravenous Every 8 hours 11/26/15 0213 11/28/15 1216   11/25/15 2015  vancomycin (VANCOCIN) IVPB 1000 mg/200 mL premix     1,000 mg 200 mL/hr over 60 Minutes Intravenous  Once 11/25/15 2001 11/25/15 2137   11/25/15 2015  piperacillin-tazobactam (ZOSYN) IVPB 3.375 g     3.375 g 100 mL/hr over 30 Minutes Intravenous  Once 11/25/15 2001 11/25/15 2051      Subjective: Had BM with dulcolax suppository- no new complaints.   Objective: Filed Vitals:   12/01/15 0400 12/01/15 0500 12/01/15 0800 12/01/15 0827  BP: 124/68  132/105   Pulse: 78 75 107   Temp: 98.4 F (36.9 C)  97.6 F (36.4 C)   TempSrc: Oral  Oral   Resp: 16 17 40   Height:      Weight:  77.6 kg (171 lb 1.2 oz)    SpO2: 96% 98% 92% 99%    Intake/Output Summary (Last  24 hours) at 12/01/15 1049 Last data filed at 12/01/15 0900  Gross per 24 hour  Intake   1203 ml  Output    600 ml  Net    603 ml   Filed Weights   11/29/15 0800 11/30/15 0500 12/01/15 0500  Weight: 74.9 kg (165 lb 2 oz) 75.2 kg (165 lb 12.6 oz) 77.6 kg (171 lb 1.2 oz)    Examination: General exam: Appears comfortable  HEENT: PERRLA, oral mucosa moist, no sclera icterus or thrush Respiratory system:  pulse ox 94% on 5 L O2 Cardiovascular system: S1 & S2 heard, RRR.  No murmurs  Gastrointestinal system: Abdomen soft, non-tender, nondistended. Normal bowel sound. No organomegaly Central nervous  system: Alert and oriented. No focal neurological deficits. Extremities: No cyanosis, clubbing or edema Skin: No rashes or ulcers Psychiatry:  Mood & affect appropriate.     Data Reviewed: I have personally reviewed following labs and imaging studies  CBC:  Recent Labs Lab 11/25/15 1936 11/25/15 2011 11/26/15 0254 11/27/15 0308 11/29/15 0310  WBC 12.0*  --  10.1 13.6* 13.5*  NEUTROABS 11.4*  --   --   --   --   HGB 12.4 13.6 11.2* 10.5* 11.9*  HCT 37.1 40.0 33.2* 31.9* 36.3  MCV 96.1  --  96.5 98.5 98.4  PLT 201  --  176 171 536   Basic Metabolic Panel:  Recent Labs Lab 11/25/15 2011 11/25/15 2040 11/26/15 0254 11/27/15 0308 11/28/15 0314 11/29/15 0310 11/30/15 0322 12/01/15 0331  NA 131* 135 135 137  --  139  --   --   K 6.2* 4.1 4.3 3.7  --  4.0  --   --   CL 101 102 105 103  --  97*  --   --   CO2  --  18* 18* 22  --  27  --   --   GLUCOSE 274* 286* 282* 187*  --  187*  --   --   BUN 23* 16 14 21*  --  31*  --   --   CREATININE 0.60 0.86 0.65 0.83  --  0.92  --   --   CALCIUM  --  9.2 8.8* 8.2*  --  9.3  --   --   MG  --   --  2.1  --  2.0 2.4 2.4 2.6*  PHOS  --   --  3.1  --  4.0 3.7 4.0 4.5   GFR: Estimated Creatinine Clearance: 69.7 mL/min (by C-G formula based on Cr of 0.92). Liver Function Tests:  Recent Labs Lab 11/25/15 2040 11/26/15 0254  AST 40 37  ALT 31 27  ALKPHOS 83 74  BILITOT 1.1 0.6  PROT 7.3 6.8  ALBUMIN 3.5 3.2*   No results for input(s): LIPASE, AMYLASE in the last 168 hours. No results for input(s): AMMONIA in the last 168 hours. Coagulation Profile: No results for input(s): INR, PROTIME in the last 168 hours. Cardiac Enzymes:  Recent Labs Lab 11/28/15 0314  TROPONINI <0.03   BNP (last 3 results) No results for input(s): PROBNP in the last 8760 hours. HbA1C: No results for input(s): HGBA1C in the last 72 hours. CBG:  Recent Labs Lab 11/30/15 0805 11/30/15 1246 11/30/15 1657 11/30/15 2059 12/01/15 0813   GLUCAP 159* 113* 152* 248* 146*   Lipid Profile: No results for input(s): CHOL, HDL, LDLCALC, TRIG, CHOLHDL, LDLDIRECT in the last 72 hours. Thyroid Function Tests: No results for input(s): TSH, T4TOTAL, FREET4,  T3FREE, THYROIDAB in the last 72 hours. Anemia Panel: No results for input(s): VITAMINB12, FOLATE, FERRITIN, TIBC, IRON, RETICCTPCT in the last 72 hours. Urine analysis:    Component Value Date/Time   COLORURINE YELLOW 11/26/2015 0203   APPEARANCEUR CLEAR 11/26/2015 0203   LABSPEC >1.046* 11/26/2015 0203   PHURINE 5.0 11/26/2015 0203   GLUCOSEU >1000* 11/26/2015 0203   HGBUR NEGATIVE 11/26/2015 0203   BILIRUBINUR NEGATIVE 11/26/2015 0203   KETONESUR NEGATIVE 11/26/2015 0203   PROTEINUR 30* 11/26/2015 0203   NITRITE NEGATIVE 11/26/2015 0203   LEUKOCYTESUR NEGATIVE 11/26/2015 0203   Sepsis Labs: '@LABRCNTIP'$ (procalcitonin:4,lacticidven:4)  ) Recent Results (from the past 240 hour(s))  Blood culture (routine x 2)     Status: None   Collection Time: 11/25/15  8:07 PM  Result Value Ref Range Status   Specimen Description BLOOD RIGHT HAND  Final   Special Requests BOTTLES DRAWN AEROBIC AND ANAEROBIC 10 ML  Final   Culture   Final    NO GROWTH 5 DAYS Performed at [facility]    Report Status 11/30/2015 FINAL  Final  Blood culture (routine x 2)     Status: None   Collection Time: 11/25/15  8:07 PM  Result Value Ref Range Status   Specimen Description BLOOD LEFT HAND  Final   Special Requests BOTTLES DRAWN AEROBIC AND ANAEROBIC 10 ML  Final   Culture   Final    NO GROWTH 5 DAYS Performed at [facility]    Report Status 11/30/2015 FINAL  Final  MRSA PCR Screening     Status: None   Collection Time: 11/26/15  1:58 AM  Result Value Ref Range Status   MRSA by PCR NEGATIVE NEGATIVE Final    Comment:        The GeneXpert MRSA Assay (FDA approved for NASAL specimens only), is one component of a comprehensive MRSA colonization surveillance program. It  is not intended to diagnose MRSA infection nor to guide or monitor treatment for MRSA infections.   Respiratory virus panel     Status: None   Collection Time: 11/26/15  2:19 AM  Result Value Ref Range Status   Respiratory Syncytial Virus A Negative Negative Final   Respiratory Syncytial Virus B Negative Negative Final   Influenza A Negative Negative Final   Influenza B Negative Negative Final   Parainfluenza 1 Negative Negative Final   Parainfluenza 2 Negative Negative Final   Parainfluenza 3 Negative Negative Final   Metapneumovirus Negative Negative Final   Rhinovirus Negative Negative Final   Adenovirus Negative Negative Final    Comment: (NOTE) Performed At: [facility] [place], [place] [id number] Lindon Romp MD [id number]          Radiology Studies: Dg Chest Port 1 View  11/30/2015  CLINICAL DATA:  Shortness of breath.  History of lung cancer EXAM: PORTABLE CHEST 1 VIEW COMPARISON:  11/28/2015 FINDINGS: Unchanged diffuse interstitial opacity with asymmetric involvement in the left mid and basilar lung. Normal heart size. No effusion or pneumothorax. IMPRESSION: Diffuse interstitial and ground-glass lung opacity by 11/25/2015 CT. Stable appearance since that time. Electronically Signed   By: Monte Fantasia M.D.   On: 11/30/2015 08:23        Scheduled Meds: . antiseptic oral rinse  7 mL Mouth Rinse BID  . DULoxetine  60 mg Oral Daily  . enoxaparin (LOVENOX) injection  40 mg Subcutaneous QHS  . fluticasone  2 spray Each Nare Daily  . gabapentin  600  mg Oral QHS  . insulin aspart  0-15 Units Subcutaneous TID WC  . insulin aspart  0-5 Units Subcutaneous QHS  . insulin aspart  3 Units Subcutaneous TID WC  . ipratropium-albuterol  3 mL Nebulization Q6H  . methylPREDNISolone (SOLU-MEDROL) injection  60 mg Intravenous Q12H  . pantoprazole  40 mg Oral Daily  . polyethylene glycol  17 g Oral BID  . sodium chloride flush  3 mL Intravenous  Q12H  . verapamil  180 mg Oral Daily   Continuous Infusions:    LOS: 6 days    Time spent in minutes: [place], MD [facility] Pager: [email or web address] Password [email or web address] 12/01/2015, 10:49 AM

## 2015-12-02 ENCOUNTER — Ambulatory Visit: Payer: BLUE CROSS/BLUE SHIELD | Admitting: Hematology & Oncology

## 2015-12-02 ENCOUNTER — Ambulatory Visit: Payer: BLUE CROSS/BLUE SHIELD

## 2015-12-02 ENCOUNTER — Other Ambulatory Visit: Payer: BLUE CROSS/BLUE SHIELD

## 2015-12-02 DIAGNOSIS — C3492 Malignant neoplasm of unspecified part of left bronchus or lung: Secondary | ICD-10-CM

## 2015-12-02 DIAGNOSIS — J8489 Other specified interstitial pulmonary diseases: Secondary | ICD-10-CM

## 2015-12-02 DIAGNOSIS — R739 Hyperglycemia, unspecified: Secondary | ICD-10-CM

## 2015-12-02 DIAGNOSIS — J449 Chronic obstructive pulmonary disease, unspecified: Secondary | ICD-10-CM

## 2015-12-02 LAB — CBC
HCT: 34 % — ABNORMAL LOW (ref 36.0–46.0)
Hemoglobin: 11.3 g/dL — ABNORMAL LOW (ref 12.0–15.0)
MCH: 31.9 pg (ref 26.0–34.0)
MCHC: 33.2 g/dL (ref 30.0–36.0)
MCV: 96 fL (ref 78.0–100.0)
PLATELETS: 297 10*3/uL (ref 150–400)
RBC: 3.54 MIL/uL — AB (ref 3.87–5.11)
RDW: 14 % (ref 11.5–15.5)
WBC: 9.6 10*3/uL (ref 4.0–10.5)

## 2015-12-02 LAB — BASIC METABOLIC PANEL
ANION GAP: 10 (ref 5–15)
BUN: 26 mg/dL — ABNORMAL HIGH (ref 6–20)
CALCIUM: 8.5 mg/dL — AB (ref 8.9–10.3)
CO2: 27 mmol/L (ref 22–32)
CREATININE: 0.78 mg/dL (ref 0.44–1.00)
Chloride: 101 mmol/L (ref 101–111)
GFR calc non Af Amer: >60 mL/min (ref >60)
Glucose, Bld: 197 mg/dL — ABNORMAL HIGH (ref 65–99)
Potassium: 4.4 mmol/L (ref 3.5–5.1)
SODIUM: 138 mmol/L (ref 135–145)

## 2015-12-02 LAB — GLUCOSE, CAPILLARY
GLUCOSE-CAPILLARY: 175 mg/dL — AB (ref 65–99)
Glucose-Capillary: 187 mg/dL — ABNORMAL HIGH (ref 65–99)

## 2015-12-02 MED ORDER — PROPRANOLOL HCL 10 MG PO TABS: 10.0000 mg | ORAL_TABLET | Freq: Three times a day (TID) | ORAL | Status: DC | PRN

## 2015-12-02 MED ORDER — PREDNISONE 20 MG PO TABS: 40.0000 mg | ORAL_TABLET | Freq: Every day | ORAL | Status: DC

## 2015-12-02 MED ORDER — PREDNISONE 20 MG PO TABS
40.0000 mg | ORAL_TABLET | Freq: Every day | ORAL | Status: DC
  Administered 2015-12-02: 40 mg via ORAL
  Filled 2015-12-02 (×2): qty 2

## 2015-12-02 MED ORDER — FREESTYLE SYSTEM KIT: 1.0000 | PACK | Status: DC | PRN

## 2015-12-02 MED ORDER — OMEPRAZOLE 20 MG PO CPDR: 20.0000 mg | DELAYED_RELEASE_CAPSULE | Freq: Every day | ORAL | Status: AC

## 2015-12-02 MED ORDER — GLIPIZIDE 5 MG PO TABS: 5.0000 mg | ORAL_TABLET | Freq: Two times a day (BID) | ORAL | Status: AC

## 2015-12-02 NOTE — Care Management Note (Signed)
Case Management Note  Patient Details  Name: ARLEIGH DICOLA MRN: [id number] Date of Birth: [birth year]  Subjective/Objective:   Admitted with SOB, pmh history of COPD, metastatic non-small cell lung cancer                  Action/Plan: Discharge planning, patient will need home O2 at d/c. Contacted [facility] to provide travel tank and arrange home O2 delivery.   Expected Discharge Date:   (unknown)               Expected Discharge Plan:  Home/Self Care  In-House Referral:  NA  Discharge planning Services  CM Consult  Post Acute Care Choice:  NA Choice offered to:  NA  DME Arranged:  Oxygen DME Agency:  [place]:  NA [place] Agency:  NA  Status of Service:  Completed, signed off  Medicare Important Message Given:    Date Medicare IM Given:    Medicare IM give by:    Date Additional Medicare IM Given:    Additional Medicare Important Message give by:     If discussed at [place] of Stay Meetings, dates discussed:    Additional Comments:  Guadalupe Maple, RN 12/02/2015, 10:13 AM [phone]

## 2015-12-02 NOTE — Progress Notes (Signed)
This downpour is now up on 5 E. She was transferred over the weekend.  Her oxygen requirements are coming down slowly. She is on 5 L of oxygen. She is ambulating. She still does desaturate.  Her chest x-ray Saturday and looked about the same. Compared to when she was admitted, it does appear to be better.  Is still not clear as to what the etiology of this interstitial pneumonitis is. I suppose it could still be the [facility]. I don't think its malignant. I'm not sure if this some type of infectious source.  She feels well. She's having no problems breathing. He's having no wheezing. There is no cough. She's had no bleeding. She's had no fever. Her appetite is picking up.  I will switch over to oral steroids now. I'll get her on prednisone at 40 mg.  Her labs looked okay. Potassium 4.4. Her blood sugar is up because of the steroids. Her white cell count is 9.6. Hemoglobin 11.3.  She is afebrile. Her blood pressure is 140/75. Her pulse is 82. Temperature 97.9. Her lungs sound great. She has no wheezes. No rhonchi. She has great air movement bilaterally. Cardiac exam regular rate and rhythm with no murmurs, rubs or bruits. Abdomen is soft. Extremities shows no clubbing, cyanosis or edema.  Since she is asymptomatic with this, I think we probably hold on an invasive bronchoscopy. I will get her onto oral steroids.  She will definitely need oxygen at home.  She is here tomorrow, she definitely will need another chest x-ray.  I very much appreciate the outstanding care that she is getting from everybody.  Carol Buchanan 33:3

## 2015-12-02 NOTE — Progress Notes (Signed)
SATURATION QUALIFICATIONS: (This note is used to comply with regulatory documentation for home oxygen)  Patient Saturations on Room Air at Rest = <89%  Patient Saturations on Room Air while Ambulating = <86%  Patient Saturations on 6 Liters of oxygen while Ambulating = 89%  Please briefly explain why patient needs home oxygen:  Patient needs oxygen to maintain adequate oxygen saturation levels.  Patient unable to tolerate being off oxygen, and needed 6 liters in order to even keep patient close to 89 percent.  Due to patient's oxygen levels dropping to unsafe levels, lowest values were not recorded, and oxygen placed back on patient .

## 2015-12-02 NOTE — Progress Notes (Signed)
PULMONARY / CRITICAL CARE MEDICINE   Name: Carol Buchanan MRN: [id number] DOB: 1961/02/02    ADMISSION DATE:  11/25/2015 CONSULTATION DATE:  12/02/2015   REFERRING MD:  Dr Shanon Brow Tat  CHIEF COMPLAINT:  Acute resp failure hypoxemic BRIEF: [facility] office patient with COPD, has a history of metastatic non-small cell lung cancer admitted with infiltrates possibly related to immunotherapy for her lung cancer.     SUBJECTIVE/OVERNIGHT/INTERVAL HX Feels much better Walked with PT easily Oxygen needs still relatively high   VITAL SIGNS: BP 142/75 mmHg  Pulse 82  Temp(Src) 97.9 F (36.6 C) (Oral)  Resp 16  Ht '5\' 4"'$  (1.626 m)  Wt 168 lb 10.4 oz (76.5 kg)  BMI 28.93 kg/m2  SpO2 89% 10 liters  HEMODYNAMICS:    VENTILATOR SETTINGS:    INTAKE / OUTPUT: I/O last 3 completed shifts: In: 723 [P.O.:720; I.V.:3] Out: 600 [Urine:600]  PHYSICAL EXAMINATION:  Gen: well appearing in chair HENT : NCAT, OP clear, red rash face PULM: Crackles left base, otherwise clear, normal effort CV: RRR, no mgr GI: BS+, soft, nontender MSK: normal bulk and tone Neuro: A&Ox3, maew  ABS: PULMONARY  Recent Labs Lab 11/25/15 2011 11/25/15 2143  PHART  --  7.400  PCO2ART  --  27.0*  PO2ART  --  63.3*  HCO3  --  16.5*  TCO2 22 15.0  O2SAT  --  90.8    CBC  Recent Labs Lab 11/27/15 0308 11/29/15 0310 12/02/15 0535  HGB 10.5* 11.9* 11.3*  HCT 31.9* 36.3 34.0*  WBC 13.6* 13.5* 9.6  PLT 171 227 297    COAGULATION No results for input(s): INR in the last 168 hours.  CARDIAC    Recent Labs Lab 11/28/15 0314  TROPONINI <0.03   No results for input(s): PROBNP in the last 168 hours.   CHEMISTRY  Recent Labs Lab 11/25/15 2040 11/26/15 0254 11/27/15 0308 11/28/15 0938 11/29/15 0310 11/30/15 0322 12/01/15 0331 12/02/15 0535  NA 135 135 137  --  139  --   --  138  K 4.1 4.3 3.7  --  4.0  --   --  4.4  CL 102 105 103  --  97*  --   --  101  CO2 18* 18* 22  --  27  --    --  27  GLUCOSE 286* 282* 187*  --  187*  --   --  197*  BUN 16 14 21*  --  31*  --   --  26*  CREATININE 0.86 0.65 0.83  --  0.92  --   --  0.78  CALCIUM 9.2 8.8* 8.2*  --  9.3  --   --  8.5*  MG  --  2.1  --  2.0 2.4 2.4 2.6*  --   PHOS  --  3.1  --  4.0 3.7 4.0 4.5  --    Estimated Creatinine Clearance: 79.5 mL/min (by C-G formula based on Cr of 0.78).   LIVER  Recent Labs Lab 11/25/15 2040 11/26/15 0254  AST 40 37  ALT 31 27  ALKPHOS 83 74  BILITOT 1.1 0.6  PROT 7.3 6.8  ALBUMIN 3.5 3.2*     INFECTIOUS  Recent Labs Lab 11/26/15 0254  11/27/15 0308 11/27/15 1238 11/28/15 0314  LATICACIDVEN  --   < > 2.9* 2.4* 2.0  PROCALCITON <0.10  --  <0.10  --  <0.10  < > = values in this interval not displayed.   ENDOCRINE  CBG (last 3)   Recent Labs  12/01/15 1736 12/01/15 2221 12/02/15 0739  GLUCAP 234* 250* 175*   CXR images personally reviewed> persistent interstitial opacities improving, left base consolidation/atelectasis unchanged    ASSESSMENT / PLAN:   Acute hypoxemic respiratory failure with infiltrates : Differential diagnosis is pulmonary toxicity from PD-1 antibody (most likely) v lymphangitis carcinomatosis baseline problems I doubt there is another process here.  She is stable clinically, walking around, etc.  She could go home, she will just need high O2 requirements and plan to continue taking prednisone.    Plan:   Would continue prednisone '40mg'$  daily until seen in the office by either one of our NPs or Dr. Lamonte Sakai.   Continue O2, will need it at home Will need repeat CXR when she comes back.  Roselie Awkward, MD [facility] Pager: [phone] Cell: [phone] After 3pm or if no response, call [phone]

## 2015-12-02 NOTE — Progress Notes (Signed)
Patient given discharge instructions, and verbalized an understanding of all discharge instructions.  Patient agrees with discharge plan, and is being discharged in stable medical condition.  Patient given transportation via wheelchair. 

## 2015-12-02 NOTE — Discharge Instructions (Signed)
Check your blood sugar prior to breakfast and dinner and keep a log to show your doctor- if sugars persist > 300, call your doctor right away. Use the lowest effective dose of Ibuprofen as needed for pain in joints  Diabetes Mellitus and Food It is important for you to manage your blood sugar (glucose) level. Your blood glucose level can be greatly affected by what you eat. Eating healthier foods in the appropriate amounts throughout the day at about the same time each day will help you control your blood glucose level. It can also help slow or prevent worsening of your diabetes mellitus. Healthy eating may even help you improve the level of your blood pressure and reach or maintain a healthy weight.  General recommendations for healthful eating and cooking habits include:  Eating meals and snacks regularly. Avoid going long periods of time without eating to lose weight.  Eating a diet that consists mainly of plant-based foods, such as fruits, vegetables, nuts, legumes, and whole grains.  Using low-heat cooking methods, such as baking, instead of high-heat cooking methods, such as deep frying. Work with your dietitian to make sure you understand how to use the Nutrition Facts information on food labels. HOW CAN FOOD AFFECT ME? Carbohydrates Carbohydrates affect your blood glucose level more than any other type of food. Your dietitian will help you determine how many carbohydrates to eat at each meal and teach you how to count carbohydrates. Counting carbohydrates is important to keep your blood glucose at a healthy level, especially if you are using insulin or taking certain medicines for diabetes mellitus. Alcohol Alcohol can cause sudden decreases in blood glucose (hypoglycemia), especially if you use insulin or take certain medicines for diabetes mellitus. Hypoglycemia can be a life-threatening condition. Symptoms of hypoglycemia (sleepiness, dizziness, and disorientation) are similar to symptoms  of having too much alcohol.  If your health care provider has given you approval to drink alcohol, do so in moderation and use the following guidelines:  Women should not have more than one drink per day, and men should not have more than two drinks per day. One drink is equal to:  12 oz of beer.  5 oz of wine.  1 oz of hard liquor.  Do not drink on an empty stomach.  Keep yourself hydrated. Have water, diet soda, or unsweetened iced tea.  Regular soda, juice, and other mixers might contain a lot of carbohydrates and should be counted. WHAT FOODS ARE NOT RECOMMENDED? As you make food choices, it is important to remember that all foods are not the same. Some foods have fewer nutrients per serving than other foods, even though they might have the same number of calories or carbohydrates. It is difficult to get your body what it needs when you eat foods with fewer nutrients. Examples of foods that you should avoid that are high in calories and carbohydrates but low in nutrients include:  Trans fats (most processed foods list trans fats on the Nutrition Facts label).  Regular soda.  Juice.  Candy.  Sweets, such as cake, pie, doughnuts, and cookies.  Fried foods. WHAT FOODS CAN I EAT? Eat nutrient-rich foods, which will nourish your body and keep you healthy. The food you should eat also will depend on several factors, including:  The calories you need.  The medicines you take.  Your weight.  Your blood glucose level.  Your blood pressure level.  Your cholesterol level. You should eat a variety of foods, including:  Protein.  Lean cuts of meat.  Proteins low in saturated fats, such as fish, egg whites, and beans. Avoid processed meats.  Fruits and vegetables.  Fruits and vegetables that may help control blood glucose levels, such as apples, mangoes, and yams.  Dairy products.  Choose fat-free or low-fat dairy products, such as milk, yogurt, and cheese.  Grains,  bread, pasta, and rice.  Choose whole grain products, such as multigrain bread, whole oats, and brown rice. These foods may help control blood pressure.  Fats.  Foods containing healthful fats, such as nuts, avocado, olive oil, canola oil, and fish. DOES EVERYONE WITH DIABETES MELLITUS HAVE THE SAME MEAL PLAN? Because every person with diabetes mellitus is different, there is not one meal plan that works for everyone. It is very important that you meet with a dietitian who will help you create a meal plan that is just right for you.   This information is not intended to replace advice given to you by your health care provider. Make sure you discuss any questions you have with your health care provider.   Document Released: 04/09/2005 Document Revised: 08/03/2014 Document Reviewed: 06/09/2013 Elsevier Interactive Patient Education [employer].

## 2015-12-02 NOTE — Discharge Summary (Addendum)
Physician Discharge Summary  Carol Buchanan AVW:[id number] DOB: Jan 15, 1961 DOA: 11/25/2015  PCP: Carol Frames, MD  Admit date: 11/25/2015 Discharge date: 12/02/2015  Time spent: 60 minutes  Recommendations for Outpatient Follow-up:  1. Dr Lamonte Sakai to wean Prednisone 2. Can drop Prilosec to daily once Prednisone weaned 3. Follow blood glucose closely while on Prednisone- she will write her sugars down and take to PCP  Discharge Condition: stable    Discharge Diagnoses:  Principal Problem:   Acute hypoxemic respiratory failure /  Pneumonitis Active Problems:   COPD (chronic obstructive pulmonary disease)    Neuropathy (HCC)   Primary lung cancer with metastasis from lung to other site [facility])   DM (diabetes mellitus), type 2, uncontrolled  / Hyperglycemia due to steroids   Essential hypertension   History of present illness:  55 year old female with a history of COPD, metastatic non-small cell lung cancer on radiation and chemotherapy started February 2017 (last chemo tx 11/13/15), presented with one-week history of worsening shortness of breath and constipation. The patient also complained of fevers and chills with a temperature up to 102.33F at home. She contacted her medical oncologist on 11/25/2015 and was prescribed cefdinir in the AM but as she did not improve, she came in to the ER by EMS on the same evening. Pox checked by her neighbor was in 60s. Given solumedrol and Atrovent by EMS.  Hospital Course:  Acute hypoxemic respiratory failure- h/o COPD and lung CA s/p radiation and on chemo - follows with Dr Lamonte Sakai for COPD - CT chest reveals known mass/ nodule in LUL which is unchanged, extensive interstitial opacities(increased from prior images) with sparing in LUL - 2 D ECHO does not reveal evidence of underlying CHF- has diuresed 6 L - Resp viral panel negative - - PCCM asssiting with management-PCCM initially recommend we call palliative care as she was not improving- based  upon work up and subsequent improvement on steroids, it appears she has a pneumonitis (possibly due to her chemo- Keytruda)-  Dr Marin Olp to decide on further chemo -she was on 13 L of Hi flow O2 for days-  weaned down to 5 L nasal cannula - ambulating well now- able to go up and down the hall yesterday and today- will need 6 L on exertion as Po2 drops to 89%  Lactic acidosis Due to above- resolved  Primary cancer of left upper lobe of lung  - non- small cell, metastatic to T4 - diagnosed in 2/17 - s/p radiation and 4 cycles of Pembrolizumab by Dr Marin Olp - last chemo 4/19 with plans to repeat in 3 wks - Dr Marin Olp has been following during the hospital stay  T4 lytic lesion- Metastasis - no collapse in vertebra  DM (diabetes mellitus), type 2  - last A1c 6.7 on 5/2 - sugars have been high due to IV steroids- will d/c home on low dose glipizide and a glucometer to check her glucose BID prior to breakfast and dinner- I have asked her to review these with her PCP and decide on further adjustment of medications - information regarding low carb diet given  Arthritis in knee - limit Ibuprofen (was on 800 mg PRN) and d/c Meloxicam 15 mg daily to prevent gastritis as she is on high dose steroids - she states pain is mostly when she walks on her treadmill - I have advised her to use the lowest effective dose of Ibuprofen at this time  Hot flashes - stopped Prempro due to high risk for DVT  with her cancer and high risk for heart disease with radiation to her chest - she understands - no hot flashes occuring in the hospital  Essential hypertension - Verapamil   Tremor/ anxiety from steroids - PRN Propranolol started by palliative care which she would like to take at home - cont Ativan as needed as well  Procedures:  none  Consultations:  PCCM  Oncology  Palliative care   Discharge Exam: Filed Weights   11/30/15 0500 12/01/15 0500 12/02/15 0647  Weight: 75.2 kg (165 lb 12.6  oz) 77.6 kg (171 lb 1.2 oz) 76.5 kg (168 lb 10.4 oz)   Filed Vitals:   12/02/15 0106 12/02/15 0647  BP: 132/73 142/75  Pulse: 83 82  Temp: 98.1 F (36.7 C) 97.9 F (36.6 C)  Resp: 16     General: AAO x 3, no distress Cardiovascular: RRR, no murmurs  Respiratory: clear to auscultation bilaterally GI: soft, non-tender, non-distended, bowel sound positive  Discharge Instructions You were cared for by a hospitalist during your hospital stay. If you have any questions about your discharge medications or the care you received while you were in the hospital after you are discharged, you can call the unit and asked to speak with the hospitalist on call if the hospitalist that took care of you is not available. Once you are discharged, your primary care physician will handle any further medical issues. Please note that NO REFILLS for any discharge medications will be authorized once you are discharged, as it is imperative that you return to your primary care physician (or establish a relationship with a primary care physician if you do not have one) for your aftercare needs so that they can reassess your need for medications and monitor your lab values.      Discharge Instructions    Discharge instructions    Complete by:  As directed   Low sodium heart healthy, low carbohydrate diet     Increase activity slowly    Complete by:  As directed             Medication List    STOP taking these medications        cefdinir 300 MG capsule  Commonly known as:  OMNICEF     dexamethasone 4 MG tablet  Commonly known as:  DECADRON     ibuprofen 800 MG tablet  Commonly known as:  ADVIL,MOTRIN     meloxicam 15 MG tablet  Commonly known as:  MOBIC     ondansetron 4 MG tablet  Commonly known as:  ZOFRAN     PREMPRO 0.625-2.5 MG tablet  Generic drug:  estrogen (conjugated)-medroxyprogesterone      TAKE these medications        Calcium Carbonate-Vit D-Min 1200-1000 MG-UNIT Chew  Chew 1  tablet by mouth daily.     CENTRUM SILVER PO  Take 1 tablet by mouth daily.     DULoxetine 60 MG capsule  Commonly known as:  CYMBALTA  Take 60 mg by mouth daily. Reported on 08/06/2015     fluticasone 50 MCG/ACT nasal spray  Commonly known as:  FLONASE  Place 2 sprays into both nostrils daily.     glipiZIDE 5 MG tablet  Commonly known as:  GLUCOTROL  Take 1 tablet (5 mg total) by mouth 2 (two) times daily before a meal.     glucose monitoring kit monitoring kit  1 each by Does not apply route as needed for other. Dispense any model that is  covered- dispense testing supplies for Q AC/ HS accuchecks- 1 month supply with one refil.     LORazepam 0.5 MG tablet  Commonly known as:  ATIVAN  Take 1 tablet (0.5 mg total) by mouth every 6 (six) hours as needed (Nausea or vomiting).     montelukast 10 MG tablet  Commonly known as:  SINGULAIR  Take 1 tablet (10 mg total) by mouth at bedtime.     omeprazole 20 MG capsule  Commonly known as:  PRILOSEC  Take 1 capsule (20 mg total) by mouth daily.     polyethylene glycol powder powder  Commonly known as:  MIRALAX  Take 17 g by mouth 2 (two) times daily.     predniSONE 20 MG tablet  Commonly known as:  DELTASONE  Take 2 tablets (40 mg total) by mouth daily with breakfast.     PROAIR HFA 108 (90 Base) MCG/ACT inhaler  Generic drug:  albuterol  inhale 1 puff every 4 hours if needed for shortness of breath     prochlorperazine 10 MG tablet  Commonly known as:  COMPAZINE  Take 1 tablet (10 mg total) by mouth every 6 (six) hours as needed (Nausea or vomiting).     propranolol 10 MG tablet  Commonly known as:  INDERAL  Take 1 tablet (10 mg total) by mouth 3 (three) times daily as needed (TREMOR, AGITATION).     simvastatin 20 MG tablet  Commonly known as:  ZOCOR  Take 20 mg by mouth daily.     Tiotropium Bromide-Olodaterol 2.5-2.5 MCG/ACT Aers  Commonly known as:  STIOLTO RESPIMAT  Inhale 2 puffs into the lungs daily.      verapamil 180 MG (CO) 24 hr tablet  Commonly known as:  COVERA HS  Take 180 mg by mouth daily.     vitamin C 1000 MG tablet  Take 1,000 mg by mouth daily.     vitamin E 400 UNIT capsule  Take 400 Units by mouth daily.       No Known Allergies Follow-up Information    Follow up with Carol Frames, MD In 1 week.   Specialty:  Family Medicine   Why:  for your sugars   Contact information:   [place] [address] [phone]       Follow up with [facility].   Why:  oxygen   Contact information:   [address] [place] [address] [phone]        The results of significant diagnostics from this hospitalization (including imaging, microbiology, ancillary and laboratory) are listed below for reference.    Significant Diagnostic Studies: Ct Angio Chest Pe W/cm &/or Wo Cm  11/25/2015  CLINICAL DATA:  Stage IV lung cancer, now with shortness of breath. Evaluate pulmonary embolism. EXAM: CT ANGIOGRAPHY CHEST WITH CONTRAST TECHNIQUE: Multidetector CT imaging of the chest was performed using the standard protocol during bolus administration of intravenous contrast. Multiplanar CT image reconstructions and MIPs were obtained to evaluate the vascular anatomy. CONTRAST:  100 cc Isovue 370 COMPARISON:  Chest CT - 11/07/2015; 06/21/2015 PET-CT - 11/04/2015 FINDINGS: Vascular Findings: There is adequate opacification of the pulmonary arterial system with the main pulmonary artery measuring 236 Hounsfield units. No discrete filling defects are seen within the pulmonary arterial tree to suggest pulmonary embolism. There is unchanged malignant narrowing involving the left upper lobe pulmonary artery secondary to known left perihilar mass. Normal caliber of the main pulmonary artery. Cardiomegaly. No pericardial  effusion. Mixed calcified and noncalcified atherosclerotic plaque with a normal caliber thoracic aorta. No definite thoracic aortic dissection on  this nongated examination. Note is again made of an apparent right subclavian artery. The branch vessels of the aortic arch widely patent throughout their imaged course. Review of the MIP images confirms the above findings. ---------------------------------------------------------------------------------- Nonvascular Findings: Mediastinum/Lymph Nodes: Scattered mediastinal lymph nodes are numerous though individually not enlarged by size criteria with index precarinal lymph node measuring 0.7 cm in greatest short axis diameter (image 30, series 4) and index left infrahilar lymph node measuring 0.9 cm (image 37, series 4). No axillary lymphadenopathy Lungs/Pleura: Known slightly spiculated nodule/mass within in the medial aspect the left upper lobe is grossly unchanged, measuring approximately 3.0 x 1.7 cm in diameter (image 32, series 7) though note, exact measurements are difficult secondary to the irregular shape of this nodule/mass. Worsening interstitial thickening throughout the remainder of the lungs with relative sparing of the left upper lobe. Worsening ill-defined areas of ground-glass within the bilateral lung bases. Air bronchograms are seen within the superior segment of the lingula. The central pulmonary airways are patent. Upper abdomen: Limited early arterial phase evaluation of the upper abdomen is normal. Musculoskeletal: Known ill-defined lytic lesion involving the T4 vertebral body appears unchanged (sagittal image 82, series 9). Increased sclerosis involving the T5, T6 and T7 vertebral bodies is grossly unchanged. Regional soft tissues appear normal. Note is made of a peripherally calcified approximately 1.3 cm nodule within the caudal aspect the right lobe of the thyroid (image 11, series 4). IMPRESSION: 1. No evidence of pulmonary embolism. 2. Known nodule/mass with the medial aspect of the left upper lobe is grossly unchanged and compatible with provided history of lung cancer. 3. Worsening  rather extensive interstitial opacities throughout the lungs with conspicuous sparing of the left upper lobe, progressed compared to PET-CT performed 11/04/2015 as well as more recently performed chest CT performed 11/07/2015 - findings are not specific with differential considerations include asymmetric pulmonary edema, atypical infection, post treatment pneumonitis, though note, lymphangitic spread of tumor could have a similar appearance. 4. Unchanged appearance of known lytic lesion involving the T4 vertebral body without associated collapse. Electronically Signed   By: Sandi Mariscal M.D.   On: 11/25/2015 21:52   Ct Angio Chest Pe W/cm &/or Wo Cm  11/07/2015  CLINICAL DATA:  Worsening shortness of breath, tachycardia, history of left lung cancer EXAM: CT ANGIOGRAPHY CHEST WITH CONTRAST TECHNIQUE: Multidetector CT imaging of the chest was performed using the standard protocol during bolus administration of intravenous contrast. Multiplanar CT image reconstructions and MIPs were obtained to evaluate the vascular anatomy. CONTRAST:  100 cc Isovue COMPARISON:  11/04/2015 FINDINGS: Images of the thoracic inlet shows stable peripheral calcified nodule in right lobe of thyroid gland measures 1.2 cm. The study is of excellent technical quality. No pulmonary embolus is noted. Again noted left upper lobe perihilar nodule stable from prior exam. Mild peripheral postobstructive consolidation in left upper lobe anteromedially is stable. Again noted left upper lobe and bilateral lower lobe multifocal ground-glass parenchymal attenuation and interstitial septal thickening consistent with postradiation pneumonitis. There is slight worsening from prior exam. Mild superimposed interstitial edema cannot be excluded. Stable mediastinal and hilar adenopathy. Central thoracic aorta is unremarkable. Heart size within normal limits. Mild thickening of distal esophageal wall suspicious for gastroesophageal reflux disease. The  visualized upper abdomen shows no adrenal gland mass. Stable mild sclerotic lesion T4 vertebral body. Degenerative changes mid thoracic spine again noted. Review of  the MIP images confirms the above findings. IMPRESSION: 1. There is no evidence of pulmonary embolus. 2. Stable left upper lobe suprahilar nodule/residual mass without significant change from prior exam. Measures about 1.8 cm. Mild postobstructive changes in left upper lobe anteromedial are stable. 3. Stable mediastinal and hilar adenopathy. 4. Again noted right upper lobe and bilateral lower lobe diffuse ground-glass parenchymal attenuation and septal interstitial thickening. There is slight worsening from prior exam. 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 Mild superimposed interstitial edema cannot be excluded. 5. Stable partially sclerotic lesion in T4 vertebral body. Electronically Signed   By: Lahoma Crocker M.D.   On: 11/07/2015 15:04   Nm Pet Image Restag (ps) Skull Base To Thigh  11/04/2015  CLINICAL DATA:  Subsequent Treatment strategy for lung cancer. Restaging examination. EXAM: NUCLEAR MEDICINE PET SKULL BASE TO THIGH TECHNIQUE: 8.43 mCi F-18 FDG was injected intravenously. Full-ring PET imaging was performed from the skull base to thigh after the radiotracer. CT data was obtained and used for attenuation correction and anatomic localization. FASTING BLOOD GLUCOSE:  Value: 111 mg/dl COMPARISON:  PET-CT 07/02/2015 FINDINGS: NECK No hypermetabolic lymph nodes in the neck. CHEST Previously noted complete collapse/ consolidation of the left upper lobe has resolved. In the central aspect of the left upper lobe there is a perihilar nodule which measures approximately 10 x 19 mm (image 62 of series 9) and demonstrates mild hypermetabolism (SUVmax = 4.7). Throughout the lungs bilaterally there is new multifocal ground-glass attenuation and septal thickening, most evident in the left lower lobe, diffusely hypermetabolic, most  compatible with postradiation pneumonitis. No other definite new pulmonary nodules are noted. There is a background of mild centrilobular emphysema. No pleural effusions. Heart size is normal. There is no significant pericardial fluid, thickening or pericardial calcification. There is atherosclerosis of the thoracic aorta, the great vessels of the mediastinum and the coronary arteries, including calcified atherosclerotic plaque in the left main, left anterior descending, left circumflex and right coronary arteries. The hypermetabolism associated with the left parahilar nodule is inseparable from some low level hypermetabolism in the left hilum. No other hypermetabolic mediastinal or right hilar adenopathy is noted. 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 Small amount of low-level hypermetabolism (SUVmax = 4.4) associated with the distal esophagus immediately above the gastroesophageal junction, favored to be physiologic. ABDOMEN/PELVIS No abnormal hypermetabolic activity within the liver, pancreas, adrenal glands, or spleen. No hypermetabolic lymph nodes in the abdomen or pelvis. Diffuse low attenuation throughout the hepatic parenchyma, compatible with hepatic steatosis. Extensive atherosclerosis throughout the abdominal and pelvic vasculature, without evidence of in addition, aneurysm. Normal appendix. SKELETON Several hypermetabolic areas are again noted throughout the axial and appendicular skeleton, most notably, in the left side of the T4 vertebral body there is a 9 x 18 mm lytic lesion (image 16 of series 7) which demonstrates low-level hypermetabolism (SUVmax = 4.7), significantly decreased compared to the prior study in both size and hypermetabolism. Another lesion is within the proximal right femoral diaphysis (SUVmax = 4.9), but demonstrates no corresponding abnormality on the CT portion of the examination. Several other smaller areas of hypermetabolism are seen  throughout the thoracic and lumbar spine, with no corresponding osseous abnormality on the CT portion of the examination. In addition, there is a healing fracture of the left sixth rib laterally which is nondisplaced. IMPRESSION: 1. Today's study demonstrates a positive response to therapy with significant regression of the previously noted left upper lobe mass, which currently is a 19 x  10 mm suprahilar nodule in the left upper lobe. Left upper lobe postobstructive consolidation/atelectasis seen on the prior study has also resolved. Ground-glass attenuation and hypermetabolism throughout the lungs bilaterally (most severe in the left lower lobe) is most compatible with evolving postradiation pneumonitis. 2. 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 There are multiple other small areas of hypermetabolism throughout the skeleton, which are nonspecific and demonstrate no corresponding CT abnormality. Attention on followup studies is recommended to exclude the possibility of additional metastatic lesions. 3. No other extra skeletal metastatic disease noted in the abdomen or pelvis. 4. Hepatic steatosis. 5. Atherosclerosis, including left main and 3 vessel coronary artery disease. Please note that although the presence of coronary artery calcium documents the presence of coronary artery disease, the severity of this disease and any potential stenosis cannot be assessed on this non-gated CT examination. Assessment for potential risk factor modification, dietary therapy or pharmacologic therapy may be warranted, if clinically indicated. Electronically Signed   By: Vinnie Langton M.D.   On: 11/04/2015 14:20   Dg Chest Port 1 View  11/30/2015  CLINICAL DATA:  Shortness of breath.  History of lung cancer EXAM: PORTABLE CHEST 1 VIEW COMPARISON:  11/28/2015 FINDINGS: Unchanged diffuse interstitial  opacity with asymmetric involvement in the left mid and basilar lung. Normal heart size. No effusion or pneumothorax. IMPRESSION: Diffuse interstitial and ground-glass lung opacity by 11/25/2015 CT. Stable appearance since that time. Electronically Signed   By: Monte Fantasia M.D.   On: 11/30/2015 08:23   Dg Chest Port 1 View  11/28/2015  CLINICAL DATA:  Respiratory failure, hypoxia, history of lung malignancy, COPD, diabetes. EXAM: PORTABLE CHEST 1 VIEW COMPARISON:  Chest x-ray and chest CT scan of Nov 25, 2015 FINDINGS: The lungs are well-expanded. There is persistent subtle increased density in the right lower lung just above the hemidiaphragm. On the left the retrocardiac region is slightly less dense. There is no large pleural effusion and no pneumothorax. The cardiac silhouette is top-normal in size. The pulmonary vascularity is normal. The bony thorax is unremarkable. IMPRESSION: Slight interval improvement in the appearance of the interstitial processes at the right lung base and left mid and lower lung. There is no evidence of pulmonary edema. The known left upper lobe mass is not clearly evident on today's study. Electronically Signed   By: David  Martinique M.D.   On: 11/28/2015 07:36   Dg Chest Port 1 View  11/25/2015  CLINICAL DATA:  Chest pain and shortness of breath. EXAM: PORTABLE CHEST 1 VIEW COMPARISON:  Chest radiographs dated 06/21/2015, chest CTA dated 11/07/2015 and PET-CT dated 11/04/2015. The FINDINGS: Interval patchy opacity in the lateral aspect of the left lower lung zone, obscuring the left heart border. Diffusely prominent interstitial markings with improvement compared to the most recent CT. The left upper lobe mass is poorly visualized. No visible pleural fluid. Unremarkable bones. IMPRESSION: 1. Interval left lateral lower lung airspace opacity suspicious for pneumonia or postobstructive changes. 2. Diffuse interstitial lung disease, increased since 06/21/2015 and decreased since  11/07/2015. This could represent a combination of chronic interstitial lung disease, interstitial pneumonitis and/or lymphangitic spread of tumor. Electronically Signed   By: Claudie Revering M.D.   On: 11/25/2015 19:57    Microbiology: Recent Results (from the past 240 hour(s))  Blood culture (routine x 2)     Status: None   Collection Time: 11/25/15  8:07 PM  Result Value Ref Range Status   Specimen Description BLOOD RIGHT HAND  Final   Special Requests BOTTLES DRAWN AEROBIC AND ANAEROBIC 10 ML  Final   Culture   Final    NO GROWTH 5 DAYS Performed at [facility]    Report Status 11/30/2015 FINAL  Final  Blood culture (routine x 2)     Status: None   Collection Time: 11/25/15  8:07 PM  Result Value Ref Range Status   Specimen Description BLOOD LEFT HAND  Final   Special Requests BOTTLES DRAWN AEROBIC AND ANAEROBIC 10 ML  Final   Culture   Final    NO GROWTH 5 DAYS Performed at [facility]    Report Status 11/30/2015 FINAL  Final  MRSA PCR Screening     Status: None   Collection Time: 11/26/15  1:58 AM  Result Value Ref Range Status   MRSA by PCR NEGATIVE NEGATIVE Final    Comment:        The GeneXpert MRSA Assay (FDA approved for NASAL specimens only), is one component of a comprehensive MRSA colonization surveillance program. It is not intended to diagnose MRSA infection nor to guide or monitor treatment for MRSA infections.   Respiratory virus panel     Status: None   Collection Time: 11/26/15  2:19 AM  Result Value Ref Range Status   Respiratory Syncytial Virus A Negative Negative Final   Respiratory Syncytial Virus B Negative Negative Final   Influenza A Negative Negative Final   Influenza B Negative Negative Final   Parainfluenza 1 Negative Negative Final   Parainfluenza 2 Negative Negative Final   Parainfluenza 3 Negative Negative Final   Metapneumovirus Negative Negative Final   Rhinovirus Negative Negative Final   Adenovirus Negative Negative  Final    Comment: (NOTE) Performed At: [facility] [place], [place] [id number] Lindon Romp MD UX:[id number]      Labs: Basic Metabolic Panel:  Recent Labs Lab 11/25/15 2040 11/26/15 0254 11/27/15 0308 11/28/15 2542 11/29/15 0310 11/30/15 0322 12/01/15 0331 12/02/15 0535  NA 135 135 137  --  139  --   --  138  K 4.1 4.3 3.7  --  4.0  --   --  4.4  CL 102 105 103  --  97*  --   --  101  CO2 18* 18* 22  --  27  --   --  27  GLUCOSE 286* 282* 187*  --  187*  --   --  197*  BUN 16 14 21*  --  31*  --   --  26*  CREATININE 0.86 0.65 0.83  --  0.92  --   --  0.78  CALCIUM 9.2 8.8* 8.2*  --  9.3  --   --  8.5*  MG  --  2.1  --  2.0 2.4 2.4 2.6*  --   PHOS  --  3.1  --  4.0 3.7 4.0 4.5  --    Liver Function Tests:  Recent Labs Lab 11/25/15 2040 11/26/15 0254  AST 40 37  ALT 31 27  ALKPHOS 83 74  BILITOT 1.1 0.6  PROT 7.3 6.8  ALBUMIN 3.5 3.2*   No results for input(s): LIPASE, AMYLASE in the last 168 hours. No results for input(s): AMMONIA in the last 168 hours. CBC:  Recent Labs Lab 11/25/15 1936 11/25/15 2011 11/26/15 0254 11/27/15 0308 11/29/15 0310 12/02/15 0535  WBC 12.0*  --  10.1  13.6* 13.5* 9.6  NEUTROABS 11.4*  --   --   --   --   --   HGB 12.4 13.6 11.2* 10.5* 11.9* 11.3*  HCT 37.1 40.0 33.2* 31.9* 36.3 34.0*  MCV 96.1  --  96.5 98.5 98.4 96.0  PLT 201  --  176 171 227 297   Cardiac Enzymes:  Recent Labs Lab 11/28/15 0314  TROPONINI <0.03   BNP: BNP (last 3 results) No results for input(s): BNP in the last 8760 hours.  ProBNP (last 3 results) No results for input(s): PROBNP in the last 8760 hours.  CBG:  Recent Labs Lab 12/01/15 0813 12/01/15 1204 12/01/15 1736 12/01/15 2221 12/02/15 0739  GLUCAP 146* 106* 234* 250* 175*       SignedDebbe Odea, MD [facility] 12/02/2015, 10:23 AM

## 2015-12-02 NOTE — Progress Notes (Signed)
[facility]    [facility] is providing the following services: Home Oxygen  If patient discharges after hours, please call [phone].   Linward Headland 12/02/2015, 11:09 AM

## 2015-12-04 ENCOUNTER — Telehealth: Payer: Self-pay | Admitting: Hematology & Oncology

## 2015-12-04 NOTE — Telephone Encounter (Signed)
Patient called yesterday and cx 12/05/15 apt and then asked to be transferred to Rn.  Patient was transferred to [facility]

## 2015-12-05 ENCOUNTER — Other Ambulatory Visit: Payer: BLUE CROSS/BLUE SHIELD

## 2015-12-05 ENCOUNTER — Ambulatory Visit: Payer: BLUE CROSS/BLUE SHIELD

## 2015-12-05 ENCOUNTER — Ambulatory Visit: Payer: BLUE CROSS/BLUE SHIELD | Admitting: Family

## 2015-12-13 ENCOUNTER — Telehealth: Payer: Self-pay | Admitting: Emergency Medicine

## 2015-12-13 ENCOUNTER — Ambulatory Visit (INDEPENDENT_AMBULATORY_CARE_PROVIDER_SITE_OTHER): Payer: BLUE CROSS/BLUE SHIELD | Admitting: Adult Health

## 2015-12-13 ENCOUNTER — Ambulatory Visit (INDEPENDENT_AMBULATORY_CARE_PROVIDER_SITE_OTHER)
Admission: RE | Admit: 2015-12-13 | Discharge: 2015-12-13 | Disposition: A | Discharge status: Home / Self Care | Payer: BLUE CROSS/BLUE SHIELD | Source: Ambulatory Visit | Attending: Adult Health | Admitting: Adult Health

## 2015-12-13 ENCOUNTER — Other Ambulatory Visit: Payer: BLUE CROSS/BLUE SHIELD

## 2015-12-13 ENCOUNTER — Ambulatory Visit: Payer: BLUE CROSS/BLUE SHIELD | Admitting: Hematology & Oncology

## 2015-12-13 ENCOUNTER — Encounter: Payer: Self-pay | Admitting: Adult Health

## 2015-12-13 ENCOUNTER — Ambulatory Visit: Payer: BLUE CROSS/BLUE SHIELD

## 2015-12-13 VITALS — BP 126/86 | HR 79 | Temp 97.7°F | Ht 64.0 in | Wt 172.0 lb

## 2015-12-13 DIAGNOSIS — C3490 Malignant neoplasm of unspecified part of unspecified bronchus or lung: Secondary | ICD-10-CM | POA: Diagnosis not present

## 2015-12-13 DIAGNOSIS — J189 Pneumonia, unspecified organism: Secondary | ICD-10-CM

## 2015-12-13 DIAGNOSIS — J9611 Chronic respiratory failure with hypoxia: Secondary | ICD-10-CM

## 2015-12-13 DIAGNOSIS — J9610 Chronic respiratory failure, unspecified whether with hypoxia or hypercapnia: Secondary | ICD-10-CM | POA: Insufficient documentation

## 2015-12-13 NOTE — Patient Instructions (Addendum)
Decrease Prednisone '30mg'$  daily for 2 weeks and then hold at '20mg'$  daily .  Decrease Oxygen 4l/m .  follow up Dr. Lamonte Sakai  In 3 weeks and As needed   Saline nasal spray and gel As needed   .Please contact office for sooner follow up if symptoms do not improve or worsen or seek emergency care

## 2015-12-13 NOTE — Assessment & Plan Note (Signed)
O2 demands decreased  Can decrease O2 at 4l/m   Plan  Decrease Prednisone '30mg'$  daily for 2 weeks and then hold at '20mg'$  daily .  Decrease Oxygen 4l/m .  follow up Dr. Lamonte Sakai  In 3 weeks and As needed   Saline nasal spray and gel As needed   .Please contact office for sooner follow up if symptoms do not improve or worsen or seek emergency care

## 2015-12-13 NOTE — Telephone Encounter (Signed)
Spoke with pt. She has been scheduled with RB on 01/22/16 at 3pm. Nothing further was needed.

## 2015-12-13 NOTE — Progress Notes (Signed)
Subjective:    Patient ID: Carol Buchanan, female    DOB: 05-Jan-1961, 55 y.o.   MRN: [id number]  HPI 55 yo female Former smoker with COPD and metastatic non-small cell lung cancer (dx 08/2015 ) . She is followed by oncology on chemo and XRT .   12/13/2015 [facility] follow up  Turns for a post hospital follow-up. Patient was recently admitted for acute hypoxic respiratory failure with pneumonitis. Patient has recently undergone chemotherapy and radiation with last chemotherapy treatment 11/13/2015. CT chest showed a mass nodule in the left lower lobe which was unchanged. An extensive interstitial opacities . Felt to have possible pneumonitis from [facility].  She has a T4 lytic lesion.  Started on o2 in hospital , required high flow. Discharged on 6 ll/m .  Today in office O2 sats >90% walking and at rest on 4l/m .  CXR today showed Left hilum density , little changes.  Sees Oncology 12/26/15 .       Past Medical History  Diagnosis Date  . Hypertension   . Hyperlipidemia   . Sinus trouble   . Neuropathy ([place])   . COPD (chronic obstructive pulmonary disease) ([place])   . Primary lung cancer with metastasis from lung to other site [facility]) 07/26/2015  . Radiation 08/06/15-08/23/15    left lung mass, T4 spinal metastasis and left rib metastasis 35 gray  . DM (diabetes mellitus), type 2, uncontrolled ([place]) 11/26/2015   Current Outpatient Prescriptions on File Prior to Visit  Medication Sig Dispense Refill  . Ascorbic Acid (VITAMIN C) 1000 MG tablet Take 1,000 mg by mouth daily.    . Calcium Carbonate-Vit D-Min 1200-1000 MG-UNIT CHEW Chew 1 tablet by mouth daily.    . DULoxetine (CYMBALTA) 60 MG capsule Take 60 mg by mouth daily. Reported on 08/06/2015  0  . fluticasone (FLONASE) 50 MCG/ACT nasal spray Place 2 sprays into both nostrils daily. 16 g 2  . glipiZIDE (GLUCOTROL) 5 MG tablet Take 1 tablet (5 mg total) by mouth 2 (two) times daily before a meal. 60 tablet 0  . glucose monitoring  kit (FREESTYLE) monitoring kit 1 each by Does not apply route as needed for other. Dispense any model that is covered- dispense testing supplies for Q AC/ HS accuchecks- 1 month supply with one refil. 1 each 1  . LORazepam (ATIVAN) 0.5 MG tablet Take 1 tablet (0.5 mg total) by mouth every 6 (six) hours as needed (Nausea or vomiting). (Patient taking differently: Take 0.5 mg by mouth at bedtime. ) 30 tablet 0  . montelukast (SINGULAIR) 10 MG tablet Take 1 tablet (10 mg total) by mouth at bedtime. 30 tablet 1  . Multiple Vitamins-Minerals (CENTRUM SILVER PO) Take 1 tablet by mouth daily.     Marland Kitchen omeprazole (PRILOSEC) 20 MG capsule Take 1 capsule (20 mg total) by mouth daily. 60 capsule 0  . polyethylene glycol powder (MIRALAX) powder Take 17 g by mouth 2 (two) times daily. 255 g 0  . predniSONE (DELTASONE) 20 MG tablet Take 2 tablets (40 mg total) by mouth daily with breakfast. 80 tablet 0  . PROAIR HFA 108 (90 BASE) MCG/ACT inhaler inhale 1 puff every 4 hours if needed for shortness of breath 8.5 Inhaler 6  . prochlorperazine (COMPAZINE) 10 MG tablet Take 1 tablet (10 mg total) by mouth every 6 (six) hours as needed (Nausea or vomiting). 30 tablet 1  . propranolol (INDERAL) 10 MG tablet Take 1 tablet (10 mg total) by mouth 3 (three)  times daily as needed (TREMOR, AGITATION). 30 tablet 0  . simvastatin (ZOCOR) 20 MG tablet Take 20 mg by mouth daily.     . Tiotropium Bromide-Olodaterol (STIOLTO RESPIMAT) 2.5-2.5 MCG/ACT AERS Inhale 2 puffs into the lungs daily. 1 Inhaler 5  . verapamil (COVERA HS) 180 MG (CO) 24 hr tablet Take 180 mg by mouth daily.     . vitamin E 400 UNIT capsule Take 400 Units by mouth daily.     No current facility-administered medications on file prior to visit.      Review of Systems Constitutional:   No  weight loss, night sweats,  Fevers, chills, + fatigue, or  lassitude.  HEENT:   No headaches,  Difficulty swallowing,  Tooth/dental problems, or  Sore throat,                 No sneezing, itching, ear ache, nasal congestion, post nasal drip,   CV:  No chest pain,  Orthopnea, PND, swelling in lower extremities, anasarca, dizziness, palpitations, syncope.   GI  No heartburn, indigestion, abdominal pain, nausea, vomiting, diarrhea, change in bowel habits, loss of appetite, bloody stools.   Resp:    No chest wall deformity  Skin: no rash or lesions.  GU: no dysuria, change in color of urine, no urgency or frequency.  No flank pain, no hematuria   MS:  No joint pain or swelling.  No decreased range of motion.  No back pain.  Psych:  No change in mood or affect. No depression or anxiety.  No memory loss.         Objective:   Physical Exam  Filed Vitals:   12/13/15 1523  BP: 126/86  Pulse: 79  Temp: 97.7 F (36.5 C)  TempSrc: Oral  Height: _0  (1.626 m)  Weight: 172 lb (78.019 kg)  SpO2: 97%   GEN: A/Ox3; pleasant , NAD, on O2   HEENT:  [place]/AT,  EACs-clear, TMs-wnl, NOSE-clear, THROAT-clear, no lesions, no postnasal drip or exudate noted.   NECK:  Supple w/ fair ROM; no JVD; normal carotid impulses w/o bruits; no thyromegaly or nodules palpated; no lymphadenopathy.  RESP : decreased BS in bases , no accessory muscle use, no dullness to percussion  CARD:  RRR, no m/r/g  , no peripheral edema, pulses intact, no cyanosis or clubbing.  GI:   Soft & nt; nml bowel sounds; no organomegaly or masses detected.  Musco: Warm bil, no deformities or joint swelling noted.   Neuro: alert, no focal deficits noted.    Skin: Warm, no lesions or rashes  Tammy Parrett NP-C  [facility]  12/13/2015       Assessment & Plan:

## 2015-12-13 NOTE — Assessment & Plan Note (Signed)
Possible Pneumonitis from [facility] -clinically improving  Consider repeat ESR on return  Slow pred taper   Plan    Decrease Prednisone 41m daily for 2 weeks and then hold at 220mdaily .  Decrease Oxygen 4l/m .  follow up Dr. ByLamonte SakaiIn 3 weeks and As needed   Saline nasal spray and gel As needed   .Please contact office for sooner follow up if symptoms do not improve or worsen or seek emergency care

## 2015-12-13 NOTE — Assessment & Plan Note (Signed)
Cont follow up with DR. Ennever as planned in 2 weeks.

## 2015-12-19 ENCOUNTER — Ambulatory Visit: Payer: BLUE CROSS/BLUE SHIELD | Admitting: Family

## 2015-12-19 ENCOUNTER — Ambulatory Visit: Payer: BLUE CROSS/BLUE SHIELD

## 2015-12-19 ENCOUNTER — Other Ambulatory Visit: Payer: BLUE CROSS/BLUE SHIELD

## 2015-12-20 ENCOUNTER — Ambulatory Visit: Payer: BLUE CROSS/BLUE SHIELD | Admitting: Hematology & Oncology

## 2015-12-20 ENCOUNTER — Ambulatory Visit: Payer: BLUE CROSS/BLUE SHIELD

## 2015-12-20 ENCOUNTER — Other Ambulatory Visit: Payer: BLUE CROSS/BLUE SHIELD

## 2015-12-24 ENCOUNTER — Emergency Department (HOSPITAL_COMMUNITY): Payer: BLUE CROSS/BLUE SHIELD

## 2015-12-24 ENCOUNTER — Encounter (HOSPITAL_COMMUNITY): Payer: Self-pay | Admitting: Emergency Medicine

## 2015-12-24 ENCOUNTER — Telehealth: Payer: Self-pay | Admitting: *Deleted

## 2015-12-24 ENCOUNTER — Emergency Department (HOSPITAL_COMMUNITY)
Admission: EM | Admit: 2015-12-24 | Discharge: 2015-12-24 | Disposition: A | Discharge status: Home / Self Care | Payer: BLUE CROSS/BLUE SHIELD | Attending: Emergency Medicine | Admitting: Emergency Medicine

## 2015-12-24 ENCOUNTER — Other Ambulatory Visit: Payer: Self-pay | Admitting: Family

## 2015-12-24 DIAGNOSIS — Z79899 Other long term (current) drug therapy: Secondary | ICD-10-CM | POA: Insufficient documentation

## 2015-12-24 DIAGNOSIS — I10 Essential (primary) hypertension: Secondary | ICD-10-CM | POA: Diagnosis not present

## 2015-12-24 DIAGNOSIS — Z7984 Long term (current) use of oral hypoglycemic drugs: Secondary | ICD-10-CM | POA: Diagnosis not present

## 2015-12-24 DIAGNOSIS — Z87891 Personal history of nicotine dependence: Secondary | ICD-10-CM | POA: Diagnosis not present

## 2015-12-24 DIAGNOSIS — J449 Chronic obstructive pulmonary disease, unspecified: Secondary | ICD-10-CM | POA: Insufficient documentation

## 2015-12-24 DIAGNOSIS — E1140 Type 2 diabetes mellitus with diabetic neuropathy, unspecified: Secondary | ICD-10-CM | POA: Diagnosis not present

## 2015-12-24 DIAGNOSIS — Z85118 Personal history of other malignant neoplasm of bronchus and lung: Secondary | ICD-10-CM | POA: Diagnosis not present

## 2015-12-24 DIAGNOSIS — K625 Hemorrhage of anus and rectum: Secondary | ICD-10-CM | POA: Insufficient documentation

## 2015-12-24 LAB — CBC
HCT: 33 % — ABNORMAL LOW (ref 36.0–46.0)
Hemoglobin: 10.8 g/dL — ABNORMAL LOW (ref 12.0–15.0)
MCH: 33.5 pg (ref 26.0–34.0)
MCHC: 32.7 g/dL (ref 30.0–36.0)
MCV: 102.5 fL — AB (ref 78.0–100.0)
PLATELETS: 268 10*3/uL (ref 150–400)
RBC: 3.22 MIL/uL — AB (ref 3.87–5.11)
RDW: 17 % — ABNORMAL HIGH (ref 11.5–15.5)
WBC: 8.4 10*3/uL (ref 4.0–10.5)

## 2015-12-24 LAB — COMPREHENSIVE METABOLIC PANEL
ALBUMIN: 4.1 g/dL (ref 3.5–5.0)
ALK PHOS: 52 U/L (ref 38–126)
ALT: 39 U/L (ref 14–54)
AST: 32 U/L (ref 15–41)
Anion gap: 14 (ref 5–15)
BUN: 17 mg/dL (ref 6–20)
CALCIUM: 9 mg/dL (ref 8.9–10.3)
CHLORIDE: 103 mmol/L (ref 101–111)
CO2: 20 mmol/L — AB (ref 22–32)
CREATININE: 1 mg/dL (ref 0.44–1.00)
GFR calc non Af Amer: >60 mL/min (ref >60)
GLUCOSE: 211 mg/dL — AB (ref 65–99)
Potassium: 4.1 mmol/L (ref 3.5–5.1)
SODIUM: 137 mmol/L (ref 135–145)
Total Bilirubin: 0.6 mg/dL (ref 0.3–1.2)
Total Protein: 6.7 g/dL (ref 6.5–8.1)

## 2015-12-24 LAB — TYPE AND SCREEN
ABO/RH(D): O POS
Antibody Screen: NEGATIVE

## 2015-12-24 LAB — POC OCCULT BLOOD, ED: FECAL OCCULT BLD: POSITIVE — AB

## 2015-12-24 LAB — HEMOGLOBIN AND HEMATOCRIT, BLOOD
HCT: 31.1 % — ABNORMAL LOW (ref 36.0–46.0)
HEMOGLOBIN: 10.6 g/dL — AB (ref 12.0–15.0)

## 2015-12-24 NOTE — Telephone Encounter (Signed)
Patient's mother calling stating that patient has had rectal bleeding since Thursday. Approximately twice a day she has had bleeding from the rectum when she has a BM. She's had no recent constipation. Today however, patient had a bleeding episode unrelated to bowel movements. The mother also reports increase shortness of breath, despite being on 4L O2 per [place], and increased facial swelling.  Reviewed all of the above with Laverna Peace NP and she would like patient to be seen in the ED for GI bleed workup. Spoke with mother, who agrees to take patient to the ED at [facility].

## 2015-12-24 NOTE — ED Notes (Signed)
Per pt, states rectal bleeding since last Thursday-states large amount of blood in stool this am

## 2015-12-24 NOTE — ED Notes (Signed)
Patient states she has had ongoing rectal bleeding since prior to her most recent hospitalization on 11/25/2015. Patient states it has become for frequent since Thursday. She is not currently undergoing chemo treatments, last was April 19. Patient reports feeling more SOB with activity over the past week. Patient wears 4LNC at home. Patient is A&Ox4, denies any new pain, reports chronic back pain. Patient mother at bedside.

## 2015-12-24 NOTE — ED Notes (Signed)
Entered patient room, pharm tech at bedside to review meds. POC occult blood card left at bedside.

## 2015-12-24 NOTE — Discharge Instructions (Signed)

## 2015-12-24 NOTE — ED Notes (Signed)
Patient reports she is more comfortable sitting in the chair than laying in the bed. Patient requested to ambulate to restroom, was given rolling O2 tank. Patient accompanied by her mother to restroom. Patient declined w/c that was offered for transportation.

## 2015-12-24 NOTE — ED Provider Notes (Signed)
CSN: [id number]     Arrival date & time 12/24/15  1536 History   First MD Initiated Contact with Patient 12/24/15 1609     Chief Complaint  Patient presents with  . Rectal Bleeding    Carol Buchanan is a 55 y.o. female with a history of Non-small cell lung cancer, COPD, diabetes on chemotherapy and radiation who presents to the ED complaining of blood in her stool for the past month. She reports she has been having bright red blood in stool with bowel movements for almost a month. She reports blood only in her stool with bowel movements. She reports today she had the urge to have a bowel movement and noticed this was only blood and no BM. She reports feeling constipated lately until today she had just blood during BM. She reports only bright red blood. She is followed by Dr. Marin Olp for oncology. She reports she has been feeling more short of breath recently and had had periods of lightheadedness over the past several days. None currently.  She is on 4 L via [place] at home. She denies history of GI bleed. She denies abdominal pain.  She denies fevers, increasing cough, abdominal pain, nausea, vomiting, urinary symptoms, hematuria, rectal pain or rashes. She is not on anticoagulants.  Patient is a 55 y.o. female presenting with hematochezia. The history is provided by the patient and medical records. No language interpreter was used.  Rectal Bleeding Associated symptoms: light-headedness   Associated symptoms: no abdominal pain, no dizziness, no fever and no vomiting     Past Medical History  Diagnosis Date  . Hypertension   . Hyperlipidemia   . Sinus trouble   . Neuropathy ([place])   . COPD (chronic obstructive pulmonary disease) ([place])   . Primary lung cancer with metastasis from lung to other site [facility]) 07/26/2015  . Radiation 08/06/15-08/23/15    left lung mass, T4 spinal metastasis and left rib metastasis 35 gray  . DM (diabetes mellitus), type 2, uncontrolled ([place]) 11/26/2015   Past Surgical  History  Procedure Laterality Date  . Tubal ligation  1994  . Back surgery  2015  . Video bronchoscopy Bilateral 07/01/2015    Procedure: VIDEO BRONCHOSCOPY WITHOUT FLUORO;  Surgeon: Collene Gobble, MD;  Location: [facility];  Service: Cardiopulmonary;  Laterality: Bilateral;   Family History  Problem Relation Age of Onset  . Heart disease Father   . Heart attack Father   . Arthritis Mother    Social History  Substance Use Topics  . Smoking status: Former Smoker -- 1.50 packs/day for 35 years    Types: Cigarettes    Quit date: 01/26/2011  . Smokeless tobacco: Never Used  . Alcohol Use: 1.2 oz/week    2 Standard drinks or equivalent per week     Comment: on weekends   OB History    No data available     Review of Systems  Constitutional: Negative for fever and chills.  HENT: Negative for congestion and sore throat.   Eyes: Negative for visual disturbance.  Respiratory: Positive for shortness of breath. Negative for cough, chest tightness and wheezing.   Cardiovascular: Negative for chest pain.  Gastrointestinal: Positive for blood in stool and hematochezia. Negative for nausea, vomiting, abdominal pain, diarrhea, abdominal distention and rectal pain.  Genitourinary: Negative for dysuria, frequency, hematuria and difficulty urinating.  Musculoskeletal: Negative for back pain and neck pain.  Skin: Negative for rash.  Neurological: Positive for light-headedness. Negative for dizziness, syncope, weakness, numbness  and headaches.      Allergies  Review of patient's allergies indicates no known allergies.  Home Medications   Prior to Admission medications   Medication Sig Start Date End Date Taking? Authorizing Provider  Ascorbic Acid (VITAMIN C) 1000 MG tablet Take 1,000 mg by mouth daily.   Yes Historical Provider, MD  Calcium Carbonate-Vit D-Min 1200-1000 MG-UNIT CHEW Chew 1 tablet by mouth daily.   Yes Historical Provider, MD  DULoxetine (CYMBALTA) 60 MG capsule Take  60 mg by mouth daily. Reported on 08/06/2015 08/02/15  Yes Historical Provider, MD  fluticasone (FLONASE) 50 MCG/ACT nasal spray Place 2 sprays into both nostrils daily. 08/29/15  Yes Collene Gobble, MD  glipiZIDE (GLUCOTROL) 5 MG tablet Take 1 tablet (5 mg total) by mouth 2 (two) times daily before a meal. 12/02/15  Yes Debbe Odea, MD  LORazepam (ATIVAN) 0.5 MG tablet take 1 tablet by mouth every 6 hours if needed for nausea or vomiting 12/24/15  Yes Eliezer Bottom, NP  montelukast (SINGULAIR) 10 MG tablet take 1 tablet by mouth at bedtime 12/24/15  Yes Eliezer Bottom, NP  Multiple Vitamins-Minerals (CENTRUM SILVER PO) Take 1 tablet by mouth daily.    Yes Historical Provider, MD  omeprazole (PRILOSEC) 20 MG capsule Take 1 capsule (20 mg total) by mouth daily. 12/02/15  Yes Debbe Odea, MD  predniSONE (DELTASONE) 20 MG tablet Take 2 tablets (40 mg total) by mouth daily with breakfast. Patient taking differently: Take 30 mg by mouth daily with breakfast.  12/02/15  Yes Debbe Odea, MD  PROAIR HFA 108 (90 BASE) MCG/ACT inhaler inhale 1 puff every 4 hours if needed for shortness of breath 05/13/15  Yes Collene Gobble, MD  prochlorperazine (COMPAZINE) 10 MG tablet Take 1 tablet (10 mg total) by mouth every 6 (six) hours as needed (Nausea or vomiting). 08/30/15  Yes Volanda Napoleon, MD  propranolol (INDERAL) 10 MG tablet Take 1 tablet (10 mg total) by mouth 3 (three) times daily as needed (TREMOR, AGITATION). 12/02/15  Yes Debbe Odea, MD  simvastatin (ZOCOR) 20 MG tablet Take 20 mg by mouth daily.    Yes Historical Provider, MD  Tiotropium Bromide-Olodaterol (STIOLTO RESPIMAT) 2.5-2.5 MCG/ACT AERS Inhale 2 puffs into the lungs daily. 08/29/15  Yes Collene Gobble, MD  verapamil (COVERA HS) 180 MG (CO) 24 hr tablet Take 180 mg by mouth daily.    Yes Historical Provider, MD  vitamin E 400 UNIT capsule Take 400 Units by mouth daily.   Yes Historical Provider, MD  glucose monitoring kit (FREESTYLE) monitoring kit 1  each by Does not apply route as needed for other. Dispense any model that is covered- dispense testing supplies for Q AC/ HS accuchecks- 1 month supply with one refil. 12/02/15   Debbe Odea, MD  polyethylene glycol powder (MIRALAX) powder Take 17 g by mouth 2 (two) times daily. Patient not taking: Reported on 12/24/2015 11/25/15   Volanda Napoleon, MD   BP 138/84 mmHg  Pulse 94  Temp(Src) 97.9 F (36.6 C) (Oral)  Resp 20  SpO2 95% Physical Exam  Constitutional: She is oriented to person, place, and time. She appears well-developed and well-nourished. No distress.  Nontoxic appearing.  HENT:  Head: Normocephalic and atraumatic.  Mouth/Throat: Oropharynx is clear and moist.  Eyes: Conjunctivae are normal. Pupils are equal, round, and reactive to light. Right eye exhibits no discharge. Left eye exhibits no discharge.  Neck: Neck supple.  Cardiovascular: Normal rate, regular rhythm, normal heart  sounds and intact distal pulses.  Exam reveals no gallop and no friction rub.   No murmur heard. Pulmonary/Chest: Effort normal and breath sounds normal. No respiratory distress. She has no wheezes. She has no rales.  Respirations of 22. Lungs clear to auscultation bilaterally. Patient on 4 L via nasal cannula.  Abdominal: Soft. Bowel sounds are normal. She exhibits no distension. There is no tenderness. There is no guarding.  Abdomen is soft and nontender to palpation.  Genitourinary: Guaiac positive stool.  Rectal exam performed by me with female RN chaperone. There are some small external hemorrhoids noted. No thrombosed hemorrhoids noted. Anal sphincter tone is normal. Patient has dark black stool on rectal exam.   Musculoskeletal: She exhibits no edema.  Lymphadenopathy:    She has no cervical adenopathy.  Neurological: She is alert and oriented to person, place, and time. Coordination normal.  Skin: Skin is warm and dry. No rash noted. She is not diaphoretic. No erythema. No pallor.  Psychiatric:  She has a normal mood and affect. Her behavior is normal.  Nursing note and vitals reviewed.   ED Course  Procedures (including critical care time) Labs Review Labs Reviewed  COMPREHENSIVE METABOLIC PANEL - Abnormal; Notable for the following:    CO2 20 (*)    Glucose, Bld 211 (*)    All other components within normal limits  CBC - Abnormal; Notable for the following:    RBC 3.22 (*)    Hemoglobin 10.8 (*)    HCT 33.0 (*)    MCV 102.5 (*)    RDW 17.0 (*)    All other components within normal limits  HEMOGLOBIN AND HEMATOCRIT, BLOOD - Abnormal; Notable for the following:    Hemoglobin 10.6 (*)    HCT 31.1 (*)    All other components within normal limits  POC OCCULT BLOOD, ED - Abnormal; Notable for the following:    Fecal Occult Bld POSITIVE (*)    All other components within normal limits  TYPE AND SCREEN  ABO/RH    Imaging Review Dg Chest 2 View  12/24/2015  CLINICAL DATA:  History of lung cancer, COPD, diabetes, increasing shortness of breath. EXAM: CHEST  2 VIEW COMPARISON:  12/13/2015, 11/25/2015 FINDINGS: Ill-defined left hilar masslike opacity obscures the left cardiac border compatible with the known underlying left upper lobe mass and surrounding radiation pneumonitis. Difficult to exclude pneumonia. Right lung remains clear. No developing effusion or pneumothorax. Trachea is midline. Stable mild cardiac enlargement. No superimposed edema. Aorta is atherosclerotic. Degenerative changes of the spine. IMPRESSION: Left hilar masslike opacity correlates with the known left upper lobe mass and surrounding left hilar and lingula radiation change or pneumonia. Little interval change compared 12/13/2015. Electronically Signed   By: Jerilynn Mages.  Shick M.D.   On: 12/24/2015 17:07   I have personally reviewed and evaluated these images and lab results as part of my medical decision-making.   EKG Interpretation None      Filed Vitals:   12/24/15 1545 12/24/15 1743 12/24/15 2016  BP:  126/74 137/79 138/84  Pulse: 101 101 94  Temp: 98 F (36.7 C) 97.9 F (36.6 C)   TempSrc: Oral Oral   Resp: 20 18 20   SpO2: 97% 99% 95%     MDM   Meds given in ED:  Medications - No data to display  Discharge Medication List as of 12/24/2015  8:44 PM      Final diagnoses:  Rectal bleeding   This  is a 55 y.o.  female with a history of Non-small cell lung cancer, COPD, diabetes on chemotherapy and radiation who presents to the ED complaining of blood in her stool for the past month. She reports she has been having bright red blood in stool with bowel movements for almost a month. She reports blood only in her stool with bowel movements. She reports today she had the urge to have a bowel movement and noticed this was only blood and no BM. She reports feeling constipated lately until today she had just blood during BM. She reports only bright red blood. She is followed by Dr. Marin Olp for oncology. She reports she has been feeling more short of breath recently and had had periods of lightheadedness over the past several days. None currently.  She is on 4 L via [place] at home. She denies history of GI bleed. She denies abdominal pain. On exam the patient is afebrile nontoxic appearing. Abdomen is soft and nontender to palpation. Lungs are clear to auscultation bilaterally. She is on 4 L via nasal cannula. On rectal exam patient has external hemorrhoids. She also has grossly bloody stool. Chest x-ray shows little change from her last x-ray on 12/13/2015. CBC shows a hemoglobin of 10.8. CMP is remarkable for glucose of 211. After a discussion with my attending and her evaluation, we feel the patient can safely be discharged if repeat H/H is stable.  Repeat 4 hour H/H shows a Hgb of 10.6 and is stable. Patient has follow-up this week with her oncologist. I encouraged her to keep this appointment. We'll also have her follow-up with gastroenterology and primary care. I discussed strict and specific return  precautions. I advised the patient to follow-up with their primary care provider this week. I advised the patient to return to the emergency department with new or worsening symptoms or new concerns. The patient verbalized understanding and agreement with plan.    This patient was discussed with and evaluated by Dr. Alfonse Spruce who agrees with assessment and plan.   Waynetta Pean, PA-C 12/25/15 6144  Harvel Quale, MD 12/28/15 [phone]

## 2015-12-24 NOTE — ED Notes (Signed)
Bed: WA07 Expected date:  Expected time:  Means of arrival:  Comments: Hold for triage 2

## 2015-12-25 LAB — ABO/RH: ABO/RH(D): O POS

## 2015-12-26 ENCOUNTER — Ambulatory Visit: Payer: BLUE CROSS/BLUE SHIELD

## 2015-12-26 ENCOUNTER — Ambulatory Visit (HOSPITAL_BASED_OUTPATIENT_CLINIC_OR_DEPARTMENT_OTHER): Payer: BLUE CROSS/BLUE SHIELD | Admitting: Hematology & Oncology

## 2015-12-26 ENCOUNTER — Other Ambulatory Visit (HOSPITAL_BASED_OUTPATIENT_CLINIC_OR_DEPARTMENT_OTHER): Payer: BLUE CROSS/BLUE SHIELD

## 2015-12-26 ENCOUNTER — Encounter: Payer: Self-pay | Admitting: Hematology & Oncology

## 2015-12-26 ENCOUNTER — Other Ambulatory Visit: Payer: Self-pay | Admitting: *Deleted

## 2015-12-26 VITALS — BP 145/79 | HR 107 | Temp 97.7°F | Resp 20 | Ht 64.0 in | Wt 175.0 lb

## 2015-12-26 DIAGNOSIS — C3492 Malignant neoplasm of unspecified part of left bronchus or lung: Secondary | ICD-10-CM | POA: Diagnosis not present

## 2015-12-26 DIAGNOSIS — R0600 Dyspnea, unspecified: Secondary | ICD-10-CM

## 2015-12-26 DIAGNOSIS — J84113 Idiopathic non-specific interstitial pneumonitis: Secondary | ICD-10-CM

## 2015-12-26 LAB — CBC WITH DIFFERENTIAL (CANCER CENTER ONLY)
BASO#: 0 10*3/uL (ref 0.0–0.2)
BASO%: 0.2 % (ref 0.0–2.0)
EOS ABS: 0 10*3/uL (ref 0.0–0.5)
EOS%: 0.2 % (ref 0.0–7.0)
HCT: 34.3 % — ABNORMAL LOW (ref 34.8–46.6)
HGB: 11.4 g/dL — ABNORMAL LOW (ref 11.6–15.9)
LYMPH#: 0.7 10*3/uL — AB (ref 0.9–3.3)
LYMPH%: 7.6 % — AB (ref 14.0–48.0)
MCH: 34.5 pg — ABNORMAL HIGH (ref 26.0–34.0)
MCHC: 33.2 g/dL (ref 32.0–36.0)
MCV: 104 fL — AB (ref 81–101)
MONO#: 0.4 10*3/uL (ref 0.1–0.9)
MONO%: 4.5 % (ref 0.0–13.0)
NEUT%: 87.5 % — ABNORMAL HIGH (ref 39.6–80.0)
NEUTROS ABS: 7.8 10*3/uL — AB (ref 1.5–6.5)
PLATELETS: 263 10*3/uL (ref 145–400)
RBC: 3.3 10*6/uL — AB (ref 3.70–5.32)
RDW: 17.1 % — ABNORMAL HIGH (ref 11.1–15.7)
WBC: 8.9 10*3/uL (ref 3.9–10.0)

## 2015-12-26 LAB — TECHNOLOGIST REVIEW CHCC SATELLITE

## 2015-12-26 LAB — CMP (CANCER CENTER ONLY)
ALK PHOS: 43 U/L (ref 26–84)
ALT(SGPT): 45 U/L (ref 10–47)
AST: 30 U/L (ref 11–38)
Albumin: 4.1 g/dL (ref 3.3–5.5)
BILIRUBIN TOTAL: 0.9 mg/dL (ref 0.20–1.60)
BUN: 13 mg/dL (ref 7–22)
CO2: 26 mEq/L (ref 18–33)
CREATININE: 0.8 mg/dL (ref 0.6–1.2)
Calcium: 9.9 mg/dL (ref 8.0–10.3)
Chloride: 103 mEq/L (ref 98–108)
Glucose, Bld: 112 mg/dL (ref 73–118)
POTASSIUM: 4.3 meq/L (ref 3.3–4.7)
SODIUM: 141 meq/L (ref 128–145)
TOTAL PROTEIN: 6.9 g/dL (ref 6.4–8.1)

## 2015-12-26 MED ORDER — LORAZEPAM 0.5 MG PO TABS: ORAL_TABLET | ORAL | Status: DC

## 2015-12-26 NOTE — Progress Notes (Signed)
Hematology and Oncology Follow Up Visit  Carol Buchanan [id number] 1961-02-19 55 y.o. 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 here today for follow-up. ed. she really has had a very tough time since we last saw her. She is hospitalized. She had pneumonitis. It's never been clear as to what the cause of the pneumonitis is area and is believed that this might be from the Carol Buchanan No biopsies were ever done.  She on a steroid taper. She had chest x-ray done a couple days ago which still showed some chronic changes in her lungs and a left hilar mass.   She is on supplemental oxygen. She probably will need the oxygen for a long time.  Again, is totally unclear as to what the source of the pneumonitis is. I would think if this was from he treated, that this would be getting better.  Regardless, we are not currently be able to treat her with any Keytruda.  A new problem that she has that she is had bright red blood per rectum. This started this week. She went to the emergency room. They let her go. In the office today, she says that she did have an several episodes a day. She's had no hemoptysis or hematemesis. There is no melena.  We are going to have to sort this out before we can do any kind of therapy on her.  She is eating a little bit better. The steroids are probably helping her with this.  She has had a little bit of a cough. Cough is nonproductive. She's had no fever. She's had no skin rashes. She's had no nausea or vomiting.  She definitely looks cushingoid because the steroids.  Overall, her performance status is ECOG 1.     Medications:    Medication List       This list is accurate as of: 12/26/15  6:02 PM.  Always use your most recent med list.               Calcium Carbonate-Vit D-Min 1200-1000 MG-UNIT Chew  Chew 1 tablet by mouth daily.     CENTRUM SILVER PO  Take 1 tablet by mouth daily.     DULoxetine 60 MG capsule  Commonly known as:  CYMBALTA  Take 60 mg by mouth daily. Reported on 08/06/2015     fluticasone 50 MCG/ACT nasal spray  Commonly known as:  FLONASE  Place 2 sprays into both nostrils daily.     glipiZIDE 5 MG tablet  Commonly known as:  GLUCOTROL  Take 1 tablet (5 mg total) by mouth 2 (two) times daily before a meal.     LORazepam 0.5 MG tablet  Commonly known as:  ATIVAN  Take 1 tablet by mouth every 6 hours if needed for nausea or vomiting     montelukast 10 MG tablet  Commonly known as:  SINGULAIR  take 1 tablet by mouth at bedtime     omeprazole 20 MG capsule  Commonly known as:  PRILOSEC  Take 1 capsule (20 mg total) by mouth daily.     ondansetron 8 MG tablet  Commonly known as:  ZOFRAN  polyethylene glycol powder powder  Commonly known as:  MIRALAX  Take 17 g by mouth 2 (two) times daily.     predniSONE 20 MG tablet  Commonly known as:  DELTASONE  Take 2 tablets (40 mg total) by mouth daily with breakfast.     PROAIR HFA 108 (90 Base) MCG/ACT inhaler  Generic drug:  albuterol  inhale 1 puff every 4 hours if needed for shortness of breath     prochlorperazine 10 MG tablet  Commonly known as:  COMPAZINE  Take 1 tablet (10 mg total) by mouth every 6 (six) hours as needed (Nausea or vomiting).     propranolol 10 MG tablet  Commonly known as:  INDERAL  Take 1 tablet (10 mg total) by mouth 3 (three) times daily as needed (TREMOR, AGITATION).     simvastatin 20 MG tablet  Commonly known as:  ZOCOR  Take 20 mg by mouth daily.     Tiotropium Bromide-Olodaterol 2.5-2.5 MCG/ACT Aers  Commonly known as:  STIOLTO RESPIMAT  Inhale 2 puffs into the  lungs daily.     verapamil 180 MG (CO) 24 hr tablet  Commonly known as:  COVERA HS  Take 180 mg by mouth daily.     vitamin C 1000 MG tablet  Take 1,000 mg by mouth daily.     vitamin E 400 UNIT capsule  Take 400 Units by mouth daily.        Allergies: No Known Allergies  Past Medical History, Surgical history, Social history, and Family History were reviewed and updated.  Review of Systems: All other 10 point review of systems is negative.   Physical Exam:  height is _0  (1.626 m) and weight is 175 lb (79.379 kg). Her oral temperature is 97.7 F (36.5 C). Her blood pressure is 145/79 and her pulse is 107. Her respiration is 20.   Wt Readings from Last 3 Encounters:  12/26/15 175 lb (79.379 kg)  12/13/15 172 lb (78.019 kg)  12/02/15 168 lb 10.4 oz (76.5 kg)    Cushingoid appearing white female in no obvious distress. Head and neck exam shows no ocular or oral lesions. She has no thrush. No adenopathy is noted in the neck. Lungs show decent breath sounds bilaterally.  She has occasional wheezes. Cardiac exam regular rate and rhythm with no murmurs, rubs or bruits. Abdomen is soft. She is slightly obese. She has good bowel sounds. She has no guarding or rebound tenderness. She has no palpable hepatosplenomegaly. Back exam shows no tenderness over the spine, ribs or hips. Extremities shows some trace edema in her legs. Skin exam shows no rashes, ecchymoses or petechia. Rectal exam shows no tears or fissures. She has no external hemorrhoids area and stool is brownish red and grossly heme positive. Neurological exam shows no focal neurological deficits.   Lab Results  Component Value Date   WBC 8.9 12/26/2015   HGB 11.4* 12/26/2015   HCT 34.3* 12/26/2015   MCV 104* 12/26/2015   PLT 263 12/26/2015   No results found for: FERRITIN, IRON, TIBC, UIBC, IRONPCTSAT Lab Results  Component Value Date   RBC 3.30* 12/26/2015   No results found for: KPAFRELGTCHN, LAMBDASER,  KAPLAMBRATIO No results found for: IGGSERUM, IGA, IGMSERUM No results found for: Odetta Pink, SPEI   Chemistry      Component Value Date/Time   NA 141 12/26/2015 1413   NA 137 12/24/2015 1550   NA 135* 11/13/2015 1131   K  4.3 12/26/2015 1413   K 4.1 12/24/2015 1550   K 4.4 11/13/2015 1131   CL 103 12/26/2015 1413   CL 103 12/24/2015 1550   CO2 26 12/26/2015 1413   CO2 20* 12/24/2015 1550   CO2 21* 11/13/2015 1131   BUN 13 12/26/2015 1413   BUN 17 12/24/2015 1550   BUN 18.7 11/13/2015 1131   CREATININE 0.8 12/26/2015 1413   CREATININE 1.00 12/24/2015 1550   CREATININE 0.8 11/13/2015 1131      Component Value Date/Time   CALCIUM 9.9 12/26/2015 1413   CALCIUM 9.0 12/24/2015 1550   CALCIUM 8.9 11/13/2015 1131   ALKPHOS 43 12/26/2015 1413   ALKPHOS 52 12/24/2015 1550   ALKPHOS 66 11/13/2015 1131   AST 30 12/26/2015 1413   AST 32 12/24/2015 1550   AST 22 11/13/2015 1131   ALT 45 12/26/2015 1413   ALT 39 12/24/2015 1550   ALT 27 11/13/2015 1131   BILITOT 0.90 12/26/2015 1413   BILITOT 0.6 12/24/2015 1550   BILITOT 0.39 11/13/2015 1131     Impression and Plan: Carol Buchanan is a 55 yo white female with metastatic poorly differentiated carcinoma of the left lung and wild-type for the typical genetic mutations.   Problem that we have now is this hematochezia. We have to get this sorted out. I spoke with Dr. Cristina Gong of gastroenterology. He thinks that the fastest way to evaluate this is to admit her to the hospital for an observation stay. He or one of his partners will see her and try to get the colonoscopy done in the next day or so.  I talked to Carol Buchanan about this. She is willing to be admitted. We will get this all set up.  If everything turns out okay, then we will initiate therapy with carboplatin/Alimta. I did this would be the preferred option for her kind of malignancy.  I gave her some information sheets  about the chemotherapy medicines. I went over the protocol with her. I think she will tolerate this well. She will lose her hair.  She will need have a Port-A-Cath placed. We can certainly get this set up for next week.  I spent 45 mins with she and her family. I just feel bad that she has had all these complications. I'm still not convinced that the tinnitus is from the [place]. I do worry about lymphangitic spread of tumor. If that is the case, then I would like to hope that chemotherapy will help.  We will try to get her back to start treatment the week of June 12.              Volanda Napoleon, MD 6/1/20176:02 PM

## 2015-12-26 NOTE — Progress Notes (Signed)
No treatment today per Dr Ennever 

## 2015-12-27 ENCOUNTER — Encounter (HOSPITAL_COMMUNITY): Payer: Self-pay

## 2015-12-27 ENCOUNTER — Observation Stay (HOSPITAL_COMMUNITY)
Admission: AD | Admit: 2015-12-27 | Discharge: 2015-12-28 | Disposition: A | Discharge status: Home / Self Care | Payer: BLUE CROSS/BLUE SHIELD | Source: Ambulatory Visit | Attending: Hematology & Oncology | Admitting: Hematology & Oncology

## 2015-12-27 ENCOUNTER — Other Ambulatory Visit: Payer: Self-pay | Admitting: Radiology

## 2015-12-27 ENCOUNTER — Other Ambulatory Visit: Payer: Self-pay | Admitting: Hematology & Oncology

## 2015-12-27 DIAGNOSIS — C7800 Secondary malignant neoplasm of unspecified lung: Secondary | ICD-10-CM | POA: Diagnosis not present

## 2015-12-27 DIAGNOSIS — J449 Chronic obstructive pulmonary disease, unspecified: Secondary | ICD-10-CM | POA: Insufficient documentation

## 2015-12-27 DIAGNOSIS — C3490 Malignant neoplasm of unspecified part of unspecified bronchus or lung: Secondary | ICD-10-CM

## 2015-12-27 DIAGNOSIS — K640 First degree hemorrhoids: Secondary | ICD-10-CM | POA: Diagnosis not present

## 2015-12-27 DIAGNOSIS — K210 Gastro-esophageal reflux disease with esophagitis: Secondary | ICD-10-CM | POA: Insufficient documentation

## 2015-12-27 DIAGNOSIS — K644 Residual hemorrhoidal skin tags: Secondary | ICD-10-CM | POA: Diagnosis not present

## 2015-12-27 DIAGNOSIS — K922 Gastrointestinal hemorrhage, unspecified: Secondary | ICD-10-CM

## 2015-12-27 DIAGNOSIS — Z853 Personal history of malignant neoplasm of breast: Secondary | ICD-10-CM | POA: Diagnosis not present

## 2015-12-27 DIAGNOSIS — I10 Essential (primary) hypertension: Secondary | ICD-10-CM | POA: Insufficient documentation

## 2015-12-27 DIAGNOSIS — K5521 Angiodysplasia of colon with hemorrhage: Secondary | ICD-10-CM | POA: Diagnosis not present

## 2015-12-27 DIAGNOSIS — E1140 Type 2 diabetes mellitus with diabetic neuropathy, unspecified: Secondary | ICD-10-CM | POA: Insufficient documentation

## 2015-12-27 DIAGNOSIS — K5791 Diverticulosis of intestine, part unspecified, without perforation or abscess with bleeding: Secondary | ICD-10-CM | POA: Diagnosis not present

## 2015-12-27 DIAGNOSIS — Z87891 Personal history of nicotine dependence: Secondary | ICD-10-CM | POA: Insufficient documentation

## 2015-12-27 DIAGNOSIS — E785 Hyperlipidemia, unspecified: Secondary | ICD-10-CM | POA: Diagnosis not present

## 2015-12-27 DIAGNOSIS — K2210 Ulcer of esophagus without bleeding: Secondary | ICD-10-CM | POA: Diagnosis not present

## 2015-12-27 DIAGNOSIS — C7989 Secondary malignant neoplasm of other specified sites: Secondary | ICD-10-CM | POA: Insufficient documentation

## 2015-12-27 DIAGNOSIS — Z85118 Personal history of other malignant neoplasm of bronchus and lung: Secondary | ICD-10-CM | POA: Insufficient documentation

## 2015-12-27 LAB — COMPREHENSIVE METABOLIC PANEL
ALBUMIN: 4 g/dL (ref 3.5–5.0)
ALK PHOS: 42 U/L (ref 38–126)
ALT: 34 U/L (ref 14–54)
ANION GAP: 9 (ref 5–15)
AST: 22 U/L (ref 15–41)
BILIRUBIN TOTAL: 0.7 mg/dL (ref 0.3–1.2)
BUN: 14 mg/dL (ref 6–20)
CHLORIDE: 106 mmol/L (ref 101–111)
CO2: 23 mmol/L (ref 22–32)
CREATININE: 0.75 mg/dL (ref 0.44–1.00)
Calcium: 8.8 mg/dL — ABNORMAL LOW (ref 8.9–10.3)
GFR calc Af Amer: >60 mL/min (ref >60)
GFR calc non Af Amer: >60 mL/min (ref >60)
Glucose, Bld: 86 mg/dL (ref 65–99)
Potassium: 4 mmol/L (ref 3.5–5.1)
SODIUM: 138 mmol/L (ref 135–145)
Total Protein: 6.6 g/dL (ref 6.5–8.1)

## 2015-12-27 LAB — CBC
HEMATOCRIT: 31.1 % — AB (ref 36.0–46.0)
HEMOGLOBIN: 10.2 g/dL — AB (ref 12.0–15.0)
MCH: 32.7 pg (ref 26.0–34.0)
MCHC: 32.8 g/dL (ref 30.0–36.0)
MCV: 99.7 fL (ref 78.0–100.0)
Platelets: 224 10*3/uL (ref 150–400)
RBC: 3.12 MIL/uL — ABNORMAL LOW (ref 3.87–5.11)
RDW: 17.2 % — ABNORMAL HIGH (ref 11.5–15.5)
WBC: 7.6 10*3/uL (ref 4.0–10.5)

## 2015-12-27 LAB — PROTIME-INR
INR: 1.06 (ref 0.00–1.49)
PROTHROMBIN TIME: 13.6 s (ref 11.6–15.2)

## 2015-12-27 LAB — APTT: APTT: 25 s (ref 24–37)

## 2015-12-27 MED ORDER — ONDANSETRON HCL 8 MG PO TABS: 8.0000 mg | ORAL_TABLET | Freq: Two times a day (BID) | ORAL | Status: DC | PRN

## 2015-12-27 MED ORDER — IBUPROFEN 200 MG PO TABS: 200.0000 mg | ORAL_TABLET | Freq: Four times a day (QID) | ORAL | Status: DC | PRN

## 2015-12-27 MED ORDER — CETYLPYRIDINIUM CHLORIDE 0.05 % MT LIQD
7.0000 mL | Freq: Two times a day (BID) | OROMUCOSAL | Status: DC
  Administered 2015-12-27 (×2): 7 mL via OROMUCOSAL

## 2015-12-27 MED ORDER — ALBUTEROL SULFATE (2.5 MG/3ML) 0.083% IN NEBU: 3.0000 mL | INHALATION_SOLUTION | RESPIRATORY_TRACT | Status: DC | PRN

## 2015-12-27 MED ORDER — PANTOPRAZOLE SODIUM 40 MG PO TBEC
40.0000 mg | DELAYED_RELEASE_TABLET | Freq: Every day | ORAL | Status: DC
  Administered 2015-12-28: 40 mg via ORAL
  Filled 2015-12-27: qty 1

## 2015-12-27 MED ORDER — POLYETHYLENE GLYCOL 3350 17 GM/SCOOP PO POWD
17.0000 g | Freq: Two times a day (BID) | ORAL | Status: DC
  Filled 2015-12-27: qty 255

## 2015-12-27 MED ORDER — FLUTICASONE PROPIONATE 50 MCG/ACT NA SUSP
2.0000 | Freq: Every day | NASAL | Status: DC
  Administered 2015-12-27 – 2015-12-28 (×2): 2 via NASAL
  Filled 2015-12-27: qty 16

## 2015-12-27 MED ORDER — PREDNISONE 20 MG PO TABS: 20.0000 mg | ORAL_TABLET | Freq: Every day | ORAL | Status: DC

## 2015-12-27 MED ORDER — ALBUTEROL SULFATE (2.5 MG/3ML) 0.083% IN NEBU
3.0000 mL | INHALATION_SOLUTION | Freq: Four times a day (QID) | RESPIRATORY_TRACT | Status: DC
  Administered 2015-12-27: 3 mL via RESPIRATORY_TRACT
  Filled 2015-12-27: qty 3

## 2015-12-27 MED ORDER — LORAZEPAM 0.5 MG PO TABS: 0.5000 mg | ORAL_TABLET | Freq: Four times a day (QID) | ORAL | Status: DC | PRN

## 2015-12-27 MED ORDER — IPRATROPIUM-ALBUTEROL 0.5-2.5 (3) MG/3ML IN SOLN
3.0000 mL | Freq: Three times a day (TID) | RESPIRATORY_TRACT | Status: DC
  Filled 2015-12-27: qty 3

## 2015-12-27 MED ORDER — MELOXICAM 15 MG PO TABS
15.0000 mg | ORAL_TABLET | ORAL | Status: DC
  Administered 2015-12-28: 15 mg via ORAL
  Filled 2015-12-27: qty 1

## 2015-12-27 MED ORDER — SODIUM CHLORIDE 0.9 % IV SOLN
INTRAVENOUS | Status: DC
  Administered 2015-12-27: 12:00:00 via INTRAVENOUS

## 2015-12-27 MED ORDER — POLYETHYLENE GLYCOL 3350 17 G PO PACK
17.0000 g | PACK | Freq: Two times a day (BID) | ORAL | Status: DC
  Administered 2015-12-27: 17 g via ORAL
  Filled 2015-12-27: qty 1

## 2015-12-27 MED ORDER — VERAPAMIL HCL ER 180 MG PO TBCR
180.0000 mg | EXTENDED_RELEASE_TABLET | Freq: Every day | ORAL | Status: DC
  Administered 2015-12-28: 180 mg via ORAL
  Filled 2015-12-27: qty 1

## 2015-12-27 MED ORDER — DULOXETINE HCL 60 MG PO CPEP
60.0000 mg | ORAL_CAPSULE | Freq: Every day | ORAL | Status: DC
  Administered 2015-12-28: 60 mg via ORAL
  Filled 2015-12-27: qty 1

## 2015-12-27 MED ORDER — CHLORHEXIDINE GLUCONATE 0.12 % MT SOLN
15.0000 mL | Freq: Two times a day (BID) | OROMUCOSAL | Status: DC
  Administered 2015-12-27 (×2): 15 mL via OROMUCOSAL
  Filled 2015-12-27 (×2): qty 15

## 2015-12-27 MED ORDER — MONTELUKAST SODIUM 10 MG PO TABS
10.0000 mg | ORAL_TABLET | Freq: Every day | ORAL | Status: DC
  Administered 2015-12-27: 10 mg via ORAL
  Filled 2015-12-27: qty 1

## 2015-12-27 MED ORDER — PEG-KCL-NACL-NASULF-NA ASC-C 100 G PO SOLR
0.5000 | Freq: Two times a day (BID) | ORAL | Status: AC
  Administered 2015-12-27 (×2): 100 g via ORAL
  Filled 2015-12-27: qty 1

## 2015-12-27 MED ORDER — ALBUTEROL SULFATE (2.5 MG/3ML) 0.083% IN NEBU
3.0000 mL | INHALATION_SOLUTION | Freq: Two times a day (BID) | RESPIRATORY_TRACT | Status: DC
  Administered 2015-12-27: 3 mL via RESPIRATORY_TRACT
  Filled 2015-12-27: qty 3

## 2015-12-27 MED ORDER — GLIPIZIDE 10 MG PO TABS
5.0000 mg | ORAL_TABLET | Freq: Two times a day (BID) | ORAL | Status: DC
  Administered 2015-12-27: 5 mg via ORAL
  Filled 2015-12-27: qty 1

## 2015-12-27 NOTE — Consult Note (Signed)
[facility] Consultation Note  Referring Provider: Dr. Burney Gauze Primary Care Physician:  Andria Frames, MD  Reason for Consultation:  Hematochezia  HPI: Carol Buchanan is a 55 y.o. female whom we've been asked to see for hematochezia.  Patient has history of metastatic lung cancer, has received radiation therapy, and has been under care of Dr. Marin Olp for systemic chemotherapy.  Patient for the past one month has had escalating constipation (despite Miralax) and fecal straining and red blood around formed stool.  Some abdominal distention but no significant abdominal pain.  No nausea or vomiting or hematemesis or melena.  She had colonoscopy in November 2012 with Dr. Oletta Lamas which showed moderate internal hemorrhoids otherwise unrevealing.  Dr. Marin Olp needs to elucidate the source of the bleeding before patient will be able to receive any further chemotherapy until the source of the bleeding is identified.  Past Medical History  Diagnosis Date  . Hypertension   . Hyperlipidemia   . Sinus trouble   . Neuropathy ([place])   . COPD (chronic obstructive pulmonary disease) ([place])   . Primary lung cancer with metastasis from lung to other site [facility]) 07/26/2015  . Radiation 08/06/15-08/23/15    left lung mass, T4 spinal metastasis and left rib metastasis 35 gray  . DM (diabetes mellitus), type 2, uncontrolled ([place]) 11/26/2015    Past Surgical History  Procedure Laterality Date  . Tubal ligation  1994  . Back surgery  2015  . Video bronchoscopy Bilateral 07/01/2015    Procedure: VIDEO BRONCHOSCOPY WITHOUT FLUORO;  Surgeon: Collene Gobble, MD;  Location: [place];  Service: Cardiopulmonary;  Laterality: Bilateral;    Prior to Admission medications   Medication Sig Start Date End Date Taking? Authorizing Provider  Ascorbic Acid (VITAMIN C) 1000 MG tablet Take 1,000 mg by mouth daily.   Yes Historical Provider, MD  Calcium Carbonate-Vitamin D (CALCIUM-VITAMIN D) 500-200 MG-UNIT  tablet Take 1 tablet by mouth daily.   Yes Historical Provider, MD  DULoxetine (CYMBALTA) 60 MG capsule Take 60 mg by mouth daily. Reported on 08/06/2015 08/02/15  Yes Historical Provider, MD  fluticasone (FLONASE) 50 MCG/ACT nasal spray Place 2 sprays into both nostrils daily. Patient taking differently: Place 2 sprays into both nostrils daily as needed for allergies.  08/29/15  Yes Collene Gobble, MD  glipiZIDE (GLUCOTROL) 5 MG tablet Take 1 tablet (5 mg total) by mouth 2 (two) times daily before a meal. 12/02/15  Yes Debbe Odea, MD  ibuprofen (ADVIL,MOTRIN) 200 MG tablet Take 200-400 mg by mouth every 6 (six) hours as needed for headache or moderate pain.   Yes Historical Provider, MD  LORazepam (ATIVAN) 0.5 MG tablet Take 1 tablet by mouth every 6 hours if needed for nausea or vomiting 12/26/15  Yes Volanda Napoleon, MD  meloxicam (MOBIC) 15 MG tablet Take 15 mg by mouth every other day.   Yes Historical Provider, MD  montelukast (SINGULAIR) 10 MG tablet take 1 tablet by mouth at bedtime 12/24/15  Yes Eliezer Bottom, NP  Multiple Vitamins-Minerals (CENTRUM SILVER PO) Take 1 tablet by mouth daily.    Yes Historical Provider, MD  omeprazole (PRILOSEC) 20 MG capsule Take 1 capsule (20 mg total) by mouth daily. 12/02/15  Yes Debbe Odea, MD  ondansetron (ZOFRAN) 8 MG tablet Take 8 mg by mouth 2 (two) times daily as needed for nausea or vomiting.  12/24/15  Yes Historical Provider, MD  polyethylene glycol powder (MIRALAX) powder Take 17 g by mouth 2 (two) times  daily. Patient taking differently: Take 17 g by mouth daily as needed for moderate constipation.  11/25/15  Yes Volanda Napoleon, MD  predniSONE (DELTASONE) 20 MG tablet Take 2 tablets (40 mg total) by mouth daily with breakfast. Patient taking differently: Take 20 mg by mouth daily with breakfast.  12/02/15  Yes Debbe Odea, MD  PROAIR HFA 108 (90 BASE) MCG/ACT inhaler inhale 1 puff every 4 hours if needed for shortness of breath Patient taking  differently: inhale 1 puff into the lungs every 4 hours if needed for shortness of breath 05/13/15  Yes Collene Gobble, MD  simvastatin (ZOCOR) 20 MG tablet Take 20 mg by mouth daily.    Yes Historical Provider, MD  Tiotropium Bromide-Olodaterol (STIOLTO RESPIMAT) 2.5-2.5 MCG/ACT AERS Inhale 2 puffs into the lungs daily. 08/29/15  Yes Collene Gobble, MD  verapamil (COVERA HS) 180 MG (CO) 24 hr tablet Take 180 mg by mouth daily.    Yes Historical Provider, MD  vitamin E 400 UNIT capsule Take 400 Units by mouth daily.   Yes Historical Provider, MD  propranolol (INDERAL) 10 MG tablet Take 1 tablet (10 mg total) by mouth 3 (three) times daily as needed (TREMOR, AGITATION). Patient not taking: Reported on 12/27/2015 12/02/15   Debbe Odea, MD    Current Facility-Administered Medications  Medication Dose Route Frequency Provider Last Rate Last Dose  . 0.9 %  sodium chloride infusion   Intravenous Continuous Volanda Napoleon, MD 50 mL/hr at 12/27/15 1201    . albuterol (PROVENTIL) (2.5 MG/3ML) 0.083% nebulizer solution 3 mL  3 mL Inhalation Q6H Volanda Napoleon, MD      . antiseptic oral rinse (CPC / CETYLPYRIDINIUM CHLORIDE 0.05%) solution 7 mL  7 mL Mouth Rinse q12n4p Volanda Napoleon, MD   7 mL at 12/27/15 1353  . chlorhexidine (PERIDEX) 0.12 % solution 15 mL  15 mL Mouth Rinse BID Volanda Napoleon, MD   15 mL at 12/27/15 1410  . [START ON 12/28/2015] DULoxetine (CYMBALTA) DR capsule 60 mg  60 mg Oral Daily Volanda Napoleon, MD      . fluticasone (FLONASE) 50 MCG/ACT nasal spray 2 spray  2 spray Each Nare Daily Volanda Napoleon, MD      . glipiZIDE (GLUCOTROL) tablet 5 mg  5 mg Oral BID AC Volanda Napoleon, MD      . ibuprofen (ADVIL,MOTRIN) tablet 200-400 mg  200-400 mg Oral Q6H PRN Volanda Napoleon, MD      . LORazepam (ATIVAN) tablet 0.5 mg  0.5 mg Oral Q6H PRN Volanda Napoleon, MD      . Derrill Memo ON 12/28/2015] meloxicam (MOBIC) tablet 15 mg  15 mg Oral QODAY Volanda Napoleon, MD      . montelukast (SINGULAIR)  tablet 10 mg  10 mg Oral QHS Volanda Napoleon, MD      . ondansetron [facility]) tablet 8 mg  8 mg Oral BID PRN Volanda Napoleon, MD      . Derrill Memo ON 12/28/2015] pantoprazole (PROTONIX) EC tablet 40 mg  40 mg Oral Daily Volanda Napoleon, MD      . polyethylene glycol (MIRALAX / GLYCOLAX) packet 17 g  17 g Oral BID Volanda Napoleon, MD   17 g at 12/27/15 1353  . [START ON 12/28/2015] predniSONE (DELTASONE) tablet 20 mg  20 mg Oral Q breakfast Volanda Napoleon, MD      . Derrill Memo ON 12/28/2015] verapamil (CALAN-SR) CR tablet 180 mg  180 mg Oral Daily Volanda Napoleon, MD        Allergies as of 12/26/2015  . (No Known Allergies)    Family History  Problem Relation Age of Onset  . Heart disease Father   . Heart attack Father   . Arthritis Mother     Social History   Social History  . Marital Status: Single    Spouse Name: N/A  . Number of Children: 0  . Years of Education: 13   Occupational History  . unemployed    Social History Main Topics  . Smoking status: Former Smoker -- 1.50 packs/day for 35 years    Types: Cigarettes    Quit date: 01/26/2011  . Smokeless tobacco: Never Used  . Alcohol Use: 1.2 oz/week    2 Standard drinks or equivalent per week     Comment: on weekends  . Drug Use: No  . Sexual Activity: No   Other Topics Concern  . Not on file   Social History Narrative   Patient lives at home with her mother.   Patient is not working   Right handed   Education- One year of college    Review of Systems: As per HPI, all others negative Physical Exam: Vital signs in last 24 hours: Temp:  [97.7 F (36.5 C)] 97.7 F (36.5 C) (06/01 1434) Pulse Rate:  [107-110] 110 (06/02 1031) Resp:  [20] 20 (06/02 1031) BP: (145-153)/(76-79) 153/76 mmHg (06/02 1031) SpO2:  [99 %] 99 % (06/02 1031) Weight:  [79.379 kg (175 lb)] 79.379 kg (175 lb) (06/01 1434)   General:   Alert, overweight, older-appearing than stated age, not acutely toxic-appearing Head:  Normocephalic and  atraumatic. Eyes:  Sclera clear, no icterus.   Conjunctiva pink. Ears:  Normal auditory acuity. Nose:  No deformity, discharge,  or lesions. Mouth:  No deformity or lesions.  Oropharynx pink & moist. Neck:  Supple; no masses or thyromegaly. Lungs:  Diminished aeration throughout, otherwise clear throughout to auscultation.   No wheezes, crackles, or rhonchi. No acute distress. Heart:  Regular rate and rhythm; no murmurs, clicks, rubs,  or gallops. Abdomen:  Soft, protuberant, nontender, mildly distended. No masses, hepatosplenomegaly or hernias noted. Normal bowel sounds, without guarding, and without rebound.     Msk:  Symmetrical without gross deformities. Normal posture. Pulses:  Normal pulses noted. Extremities:  Without clubbing or edema. Neurologic:  Alert and  oriented x4; diffusely weak, otherwise grossly normal neurologically. Skin:  Scattered bruises; otherwise intact without significant lesions or rashes. Psych:  Alert and cooperative. Normal mood and affect.   Lab Results:  Recent Labs  12/24/15 1550 12/24/15 1959 12/26/15 1413 12/27/15 1145  WBC 8.4  --  8.9 7.6  HGB 10.8* 10.6* 11.4* 10.2*  HCT 33.0* 31.1* 34.3* 31.1*  PLT 268  --  263 224   BMET  Recent Labs  12/24/15 1550 12/26/15 1413 12/27/15 1145  NA 137 141 138  K 4.1 4.3 4.0  CL 103 103 106  CO2 20* 26 23  GLUCOSE 211* 112 86  BUN '17 13 14  '$ CREATININE 1.00 0.8 0.75  CALCIUM 9.0 9.9 8.8*   LFT  Recent Labs  12/27/15 1145  PROT 6.6  ALBUMIN 4.0  AST 22  ALT 34  ALKPHOS 42  BILITOT 0.7   PT/INR  Recent Labs  12/27/15 1145  LABPROT 13.6  INR 1.06    Studies/Results: No results found.  Impression:  1.  Hematochezia.  In conjunction with constipation  and straining, as well as patient's prior history of moderate internal hemorrhoids seen on her last colonoscopy, I would suspect anorectal source (i.e., hemorrhoids) for patient's bleeding. 2.  Anemia, possible contribution from  bleeding, but likely multifactorial. 3.  Constipation. 4.  Metastatic lung cancer.  Dr. Marin Olp would like bleeding source elucidated prior to resuming chemotherapy.  Plan:  1.  Colonoscopy tomorrow with Dr. Michail Sermon at 930. 2.  Patient did not seem to have much success with Miralax 17 g po bid.  Might consider lubiprostone 8 mcg po bid with meals pending colonoscopy findings. 3.  Next step in management is pending colonoscopy findings.     Landry Dyke  12/27/2015, 2:33 PM  Pager [phone] If no answer or after 5 PM call [phone]

## 2015-12-27 NOTE — H&P (Signed)
Referral MD  Reason for Referral: Lower GI bleeding. History of metastatic lung cancer.   No chief complaint on file. : I've been having blood to my rectum for the past week.  HPI: Carol Buchanan is a very nice 55 year old white female. I have known her for quite a while. She has metastatic lung cancer. She has been on treatment with immunotherapy. She's been on Keytruda. She has not any treatment now for 2 months or so. She was hospitalized in May with what appears to be pneumonitis which could be from [place]. She is on oxygen. She had recent chest x-rays which looks stable.  Unfortunately, this past week, she began to have bright red blood per rectum. She went to the emergency room area and she was sent home afterwards.   We saw her yesterday in the office. She had a rectal exam which showed gross blood in the rectal vault. She is feeling well. She is not having abdominal pain. She is eating okay. She is not having fever. She is not having any increased cough. Again she is on oxygen.  I spoke with Dr. Cristina Gong of gastroenterology service. She needs a colonoscopy. I cannot treat her with systemic chemotherapy until I know where she is bleeding from.  He thought it would be easiest to get her admitted for a 23 hour observation. His service will do the colonoscopy as an inpatient. We can see where the bleeding is coming from and then try to let her go home.  I spoke to her about this yesterday. She agreed to be admitted to have procedure done. As such, she is being admitted.  Currently, her performance status is ECOG 1.   Past Medical History  Diagnosis Date  . Hypertension   . Hyperlipidemia   . Sinus trouble   . Neuropathy ([place])   . COPD (chronic obstructive pulmonary disease) ([place])   . Primary lung cancer with metastasis from lung to other site [facility]) 07/26/2015  . Radiation 08/06/15-08/23/15    left lung mass, T4 spinal metastasis and left rib metastasis 35 gray  . DM (diabetes  mellitus), type 2, uncontrolled ([place]) 11/26/2015  :  Past Surgical History  Procedure Laterality Date  . Tubal ligation  1994  . Back surgery  2015  . Video bronchoscopy Bilateral 07/01/2015    Procedure: VIDEO BRONCHOSCOPY WITHOUT FLUORO;  Surgeon: Collene Gobble, MD;  Location: [place];  Service: Cardiopulmonary;  Laterality: Bilateral;  :   Current facility-administered medications:  .  0.9 %  sodium chloride infusion, , Intravenous, Continuous, Volanda Napoleon, MD, Last Rate: 50 mL/hr at 12/27/15 1201 .  albuterol (PROVENTIL) (2.5 MG/3ML) 0.083% nebulizer solution 3 mL, 3 mL, Inhalation, Q6H, Volanda Napoleon, MD .  Derrill Memo ON 12/28/2015] DULoxetine (CYMBALTA) DR capsule 60 mg, 60 mg, Oral, Daily, Volanda Napoleon, MD .  fluticasone (FLONASE) 50 MCG/ACT nasal spray 2 spray, 2 spray, Each Nare, Daily, Volanda Napoleon, MD .  glipiZIDE (GLUCOTROL) tablet 5 mg, 5 mg, Oral, BID AC, Volanda Napoleon, MD .  ibuprofen (ADVIL,MOTRIN) tablet 200-400 mg, 200-400 mg, Oral, Q6H PRN, Volanda Napoleon, MD .  LORazepam (ATIVAN) tablet 0.5 mg, 0.5 mg, Oral, Q6H PRN, Volanda Napoleon, MD .  Derrill Memo ON 12/28/2015] meloxicam (MOBIC) tablet 15 mg, 15 mg, Oral, QODAY, Volanda Napoleon, MD .  montelukast (SINGULAIR) tablet 10 mg, 10 mg, Oral, QHS, Volanda Napoleon, MD .  ondansetron [facility]) tablet 8 mg, 8 mg, Oral, BID PRN, Collier [place]  Oletha Cruel, MD .  Derrill Memo ON 12/28/2015] pantoprazole (PROTONIX) EC tablet 40 mg, 40 mg, Oral, Daily, Volanda Napoleon, MD .  polyethylene glycol (MIRALAX / GLYCOLAX) packet 17 g, 17 g, Oral, BID, Volanda Napoleon, MD .  Derrill Memo ON 12/28/2015] predniSONE (DELTASONE) tablet 20 mg, 20 mg, Oral, Q breakfast, Volanda Napoleon, MD .  Derrill Memo ON 12/28/2015] verapamil (CALAN-SR) CR tablet 180 mg, 180 mg, Oral, Daily, Volanda Napoleon, MD:  . albuterol  3 mL Inhalation Q6H  . [START ON 12/28/2015] DULoxetine  60 mg Oral Daily  . fluticasone  2 spray Each Nare Daily  . glipiZIDE  5 mg Oral BID AC  . [START ON  12/28/2015] meloxicam  15 mg Oral QODAY  . montelukast  10 mg Oral QHS  . [START ON 12/28/2015] pantoprazole  40 mg Oral Daily  . polyethylene glycol  17 g Oral BID  . [START ON 12/28/2015] predniSONE  20 mg Oral Q breakfast  . [START ON 12/28/2015] verapamil  180 mg Oral Daily  :  No Known Allergies:  Family History  Problem Relation Age of Onset  . Heart disease Father   . Heart attack Father   . Arthritis Mother   :  Social History   Social History  . Marital Status: Single    Spouse Name: N/A  . Number of Children: 0  . Years of Education: 13   Occupational History  . unemployed    Social History Main Topics  . Smoking status: Former Smoker -- 1.50 packs/day for 35 years    Types: Cigarettes    Quit date: 01/26/2011  . Smokeless tobacco: Never Used  . Alcohol Use: 1.2 oz/week    2 Standard drinks or equivalent per week     Comment: on weekends  . Drug Use: No  . Sexual Activity: No   Other Topics Concern  . Not on file   Social History Narrative   Patient lives at home with her mother.   Patient is not working   Right handed   Education- One year of college  :  Pertinent items are noted in HPI.  Exam: Patient Vitals for the past 24 hrs:  BP Pulse Resp SpO2  12/27/15 1031 (!) 153/76 mmHg (!) 110 20 99 %   Well-developed and well-nourished white female who is cushingoid from steroids. Her an exam shows no ocular or oral lesions. She has no palpable cervical or supraclavicular lymph nodes. Lungs are with some diffuse breath sounds bilaterally. She has occasional expiratory wheezes. Cardiac exam regular rate and rhythm with no murmurs, rubs or bruits. Abdomen is soft. She has decent bowel sounds. There is no guarding or rebound tenderness. There is no mass. There is no palpable liver or spleen tip. Extremities shows some trace edema in her lower legs. Neurological exam shows no focal neurological deficits.    Recent Labs  12/26/15 1413 12/27/15 1145  WBC 8.9 7.6   HGB 11.4* 10.2*  HCT 34.3* 31.1*  PLT 263 224    Recent Labs  12/26/15 1413 12/27/15 1145  NA 141 138  K 4.3 4.0  CL 103 106  CO2 26 23  GLUCOSE 112 86  BUN 13 14  CREATININE 0.8 0.75  CALCIUM 9.9 8.8*    Blood smear review: None  Pathology: None     Assessment and Plan:  Carol Buchanan is a 55 year old white female with metastatic non-small cell lung cancer. She now has lower GI bleeding. After bleeding at  this, and from the colon. She may have diverticulosis. She may have AV malformation. She may have angiodysplasia. I would not think that this is reflective of malignancy that metastasized to her colon.  I noted that her hemoglobin has dropped somewhat. We will have to watch out for this. We will check her hemoglobin tomorrow.  I spoke with the I spoke with Dr. Orvan July today. He will let his GI service know of her admission so that the colon prep can begin. I really want to if this done tomorrow. If everything looks okay and her blood work looks okay, then we can likely discharge her.  We will continue her other medications. She will be on supplemental oxygen.  I do appreciate all the great help that she will get from the staff on [address]. I know that she will get fantastic care that is compassionate.  Carol Buchanan 40:29

## 2015-12-28 ENCOUNTER — Encounter (HOSPITAL_COMMUNITY)
Admission: AD | Disposition: A | Discharge status: Home / Self Care | Payer: Self-pay | Source: Ambulatory Visit | Attending: Hematology & Oncology

## 2015-12-28 ENCOUNTER — Other Ambulatory Visit: Payer: Self-pay | Admitting: General Surgery

## 2015-12-28 ENCOUNTER — Encounter (HOSPITAL_COMMUNITY): Payer: Self-pay

## 2015-12-28 DIAGNOSIS — K922 Gastrointestinal hemorrhage, unspecified: Secondary | ICD-10-CM

## 2015-12-28 DIAGNOSIS — C7951 Secondary malignant neoplasm of bone: Secondary | ICD-10-CM | POA: Diagnosis not present

## 2015-12-28 DIAGNOSIS — C3412 Malignant neoplasm of upper lobe, left bronchus or lung: Secondary | ICD-10-CM

## 2015-12-28 HISTORY — PX: COLONOSCOPY: SHX5424

## 2015-12-28 HISTORY — PX: ESOPHAGOGASTRODUODENOSCOPY: SHX5428

## 2015-12-28 LAB — CBC WITH DIFFERENTIAL/PLATELET
BASOS ABS: 0 10*3/uL (ref 0.0–0.1)
BASOS PCT: 1 %
EOS ABS: 0 10*3/uL (ref 0.0–0.7)
EOS PCT: 1 %
HCT: 28.4 % — ABNORMAL LOW (ref 36.0–46.0)
HEMOGLOBIN: 9.3 g/dL — AB (ref 12.0–15.0)
LYMPHS ABS: 0.7 10*3/uL (ref 0.7–4.0)
Lymphocytes Relative: 15 %
MCH: 32.6 pg (ref 26.0–34.0)
MCHC: 32.7 g/dL (ref 30.0–36.0)
MCV: 99.6 fL (ref 78.0–100.0)
MONOS PCT: 8 %
Monocytes Absolute: 0.4 10*3/uL (ref 0.1–1.0)
NEUTROS ABS: 3.4 10*3/uL (ref 1.7–7.7)
Neutrophils Relative %: 75 %
Platelets: 220 10*3/uL (ref 150–400)
RBC: 2.85 MIL/uL — ABNORMAL LOW (ref 3.87–5.11)
RDW: 17.3 % — ABNORMAL HIGH (ref 11.5–15.5)
WBC: 4.5 10*3/uL (ref 4.0–10.5)

## 2015-12-28 SURGERY — EGD (ESOPHAGOGASTRODUODENOSCOPY)
Anesthesia: Moderate Sedation

## 2015-12-28 MED ORDER — SODIUM CHLORIDE 0.9 % IV SOLN: INTRAVENOUS | Status: DC

## 2015-12-28 MED ORDER — MIDAZOLAM HCL 5 MG/5ML IJ SOLN
INTRAMUSCULAR | Status: DC | PRN
  Administered 2015-12-28 (×4): 2 mg via INTRAVENOUS

## 2015-12-28 MED ORDER — MIDAZOLAM HCL 5 MG/ML IJ SOLN
INTRAMUSCULAR | Status: AC
  Filled 2015-12-28: qty 4

## 2015-12-28 MED ORDER — FENTANYL CITRATE (PF) 100 MCG/2ML IJ SOLN
INTRAMUSCULAR | Status: DC | PRN
  Administered 2015-12-28 (×4): 25 ug via INTRAVENOUS

## 2015-12-28 MED ORDER — FENTANYL CITRATE (PF) 100 MCG/2ML IJ SOLN
INTRAMUSCULAR | Status: AC
  Filled 2015-12-28: qty 4

## 2015-12-28 MED ORDER — CHLORHEXIDINE GLUCONATE 0.12 % MT SOLN: 15.0000 mL | Freq: Two times a day (BID) | OROMUCOSAL | Status: DC

## 2015-12-28 MED ORDER — GLIPIZIDE 5 MG PO TABS: 5.0000 mg | ORAL_TABLET | Freq: Two times a day (BID) | ORAL | Status: DC

## 2015-12-28 MED ORDER — DIPHENHYDRAMINE HCL 50 MG/ML IJ SOLN
INTRAMUSCULAR | Status: AC
  Filled 2015-12-28: qty 1

## 2015-12-28 MED ORDER — IPRATROPIUM-ALBUTEROL 0.5-2.5 (3) MG/3ML IN SOLN: 3.0000 mL | Freq: Three times a day (TID) | RESPIRATORY_TRACT | Status: AC

## 2015-12-28 MED ORDER — HYDROCORTISONE ACETATE 25 MG RE SUPP: 25.0000 mg | Freq: Two times a day (BID) | RECTAL | Status: DC

## 2015-12-28 NOTE — Brief Op Note (Signed)
Nonbleeding internal and external hemorrhoids seen on colonoscopy and otherwise colonoscopy normal. EGD with minimal esophagitis and a tiny nonbleeding esophageal ulcer. No bleeding or blood products seen on GI procedures. Suspect bleeding came from hemorrhoids and would recommend Anusol suppositories prn. OK to eat solid food and ok to go home today from a GI standpoint.

## 2015-12-28 NOTE — Interval H&P Note (Signed)
History and Physical Interval Note:  12/28/2015 11:10 AM  Carol Buchanan  has presented today for surgery, with the diagnosis of hematochezia  The various methods of treatment have been discussed with the patient and family. After consideration of risks, benefits and other options for treatment, the patient has consented to  Procedure(s): COLONOSCOPY (Left) ESOPHAGOGASTRODUODENOSCOPY (EGD) (N/A) as a surgical intervention .  The patient's history has been reviewed, patient examined, no change in status, stable for surgery.  I have reviewed the patient's chart and labs.  Questions were answered to the patient's satisfaction.     [place] C.

## 2015-12-28 NOTE — Interval H&P Note (Signed)
History and Physical Interval Note:  12/28/2015 11:24 AM  Carol Buchanan  has presented today for surgery, with the diagnosis of Hematochezia  The various methods of treatment have been discussed with the patient and family. After consideration of risks, benefits and other options for treatment, the patient has consented to  Procedure(s): ESOPHAGOGASTRODUODENOSCOPY (EGD) (N/A) as a surgical intervention .  The patient's history has been reviewed, patient examined, no change in status, stable for surgery.  I have reviewed the patient's chart and labs.  Questions were answered to the patient's satisfaction.     [place] C.

## 2015-12-28 NOTE — Op Note (Signed)
[facility] Patient Name: Carol Buchanan Procedure Date: 12/28/2015 MRN: [id number] Attending MD: Lear Ng , MD Date of Birth: 11/09/1960 CSN: [id number] Age: 55 Admit Type: Inpatient Procedure:                Colonoscopy Indications:              Evaluation of unexplained GI bleeding, Rectal                            bleeding Providers:                Lear Ng, MD, Laverta Baltimore, RN, Alfonso Patten, Technician Referring MD:             hospital team Medicines:                Fentanyl 50 micrograms IV, Midazolam 4 mg IV Complications:            No immediate complications. Estimated Blood Loss:     Estimated blood loss: none. Procedure:                Pre-Anesthesia Assessment:                           - Prior to the procedure, a History and Physical                            was performed, and patient medications and                            allergies were reviewed. The patient's tolerance of                            previous anesthesia was also reviewed. The risks                            and benefits of the procedure and the sedation                            options and risks were discussed with the patient.                            All questions were answered, and informed consent                            was obtained. Prior Anticoagulants: The patient has                            taken no previous anticoagulant or antiplatelet                            agents. ASA Grade Assessment: III - A patient with  severe systemic disease. After reviewing the risks                            and benefits, the patient was deemed in                            satisfactory condition to undergo the procedure.                           After obtaining informed consent, the colonoscope                            was passed under direct vision. Throughout the   procedure, the patient's blood pressure, pulse, and                            oxygen saturations were monitored continuously. The                            EC-[id number] ([id number]) scope was introduced through                            the anus and advanced to the the cecum, identified                            by appendiceal orifice and ileocecal valve. The                            colonoscopy was performed without difficulty. The                            patient tolerated the procedure well. The quality                            of the bowel preparation was adequate and good. The                            terminal ileum, ileocecal valve, appendiceal                            orifice, and rectum were photographed. Scope In: 11:28:18 AM Scope Out: 11:41:11 AM Scope Withdrawal Time: 0 hours 5 minutes 58 seconds  Total Procedure Duration: 0 hours 12 minutes 53 seconds  Findings:      The perianal exam findings include non-thrombosed external hemorrhoids.      Internal hemorrhoids were found during retroflexion. The hemorrhoids       were large and Grade I (internal hemorrhoids that do not prolapse).      The exam was otherwise normal throughout the examined colon.      The terminal ileum appeared normal.      - NO active bleeding or blood products seen during the colonoscopy. Impression:               - Non-thrombosed external hemorrhoids found on  perianal exam.                           - Internal hemorrhoids.                           - The examined portion of the ileum was normal.                           - No specimens collected. Moderate Sedation:      Moderate (conscious) sedation was administered by the endoscopy nurse       and supervised by the endoscopist. The following parameters were       monitored: oxygen saturation, heart rate, blood pressure, and response       to care. Recommendation:           - Anusol (pramoxine) suppository: Insert  rectally                            daily PRN.                           - Diabetic (ADA) diet.                           - Post procedure medication orders were given.                           - Repeat colonoscopy in 5-10 years for screening                            purposes. Procedure Code(s):        --- Professional ---                           [phone], Colonoscopy, flexible; diagnostic, including                            collection of specimen(s) by brushing or washing,                            when performed (separate procedure) Diagnosis Code(s):        --- Professional ---                           K92.2, Gastrointestinal hemorrhage, unspecified                           K62.5, Hemorrhage of anus and rectum                           K64.0, First degree hemorrhoids                           K64.4, Residual hemorrhoidal skin tags CPT copyright 2016 [employer]. All rights reserved. The codes documented in this report are preliminary and upon coder review may  be revised to meet current compliance requirements. Lear Ng, MD 12/28/2015  12:15:16 PM This report has been signed electronically. Number of Addenda: 0

## 2015-12-28 NOTE — Op Note (Signed)
[facility] Patient Name: Carol Buchanan Procedure Date: 12/28/2015 MRN: [id number] Attending MD: Lear Ng , MD Date of Birth: 08-18-1960 CSN: [id number] Age: 55 Admit Type: Inpatient Procedure:                Upper GI endoscopy Indications:              Recent gastrointestinal bleeding Providers:                Lear Ng, MD, Laverta Baltimore, RN, Alfonso Patten, Technician Referring MD:              Medicines:                Fentanyl 50 micrograms IV, Midazolam 4 mg IV Complications:            No immediate complications. Estimated Blood Loss:     Estimated blood loss: none. Procedure:                Pre-Anesthesia Assessment:                           - Prior to the procedure, a History and Physical                            was performed, and patient medications and                            allergies were reviewed. The patient's tolerance of                            previous anesthesia was also reviewed. The risks                            and benefits of the procedure and the sedation                            options and risks were discussed with the patient.                            All questions were answered, and informed consent                            was obtained. Prior Anticoagulants: The patient has                            taken no previous anticoagulant or antiplatelet                            agents. ASA Grade Assessment: III - A patient with                            severe systemic disease. After reviewing the risks  and benefits, the patient was deemed in                            satisfactory condition to undergo the procedure.                           After obtaining informed consent, the endoscope was                            passed under direct vision. Throughout the                            procedure, the patient's blood pressure, pulse, and                    oxygen saturations were monitored continuously. The                            was introduced through the mouth, and advanced to                            the second part of duodenum. The upper GI endoscopy                            was accomplished without difficulty. The patient                            tolerated the procedure well. Scope In: Scope Out: Findings:      LA Grade A (one or more mucosal breaks less than 5 mm, not extending       between tops of 2 mucosal folds) esophagitis with no bleeding was found       at the gastroesophageal junction.      One superficial esophageal ulcer with no bleeding and no stigmata of       recent bleeding was found at the gastroesophageal junction. The lesion       was 1 mm in largest dimension.      The entire examined stomach was normal.      The examined duodenum was normal. Impression:               - LA Grade A reflux esophagitis.                           - Non-bleeding esophageal ulcer.                           - Normal stomach.                           - Normal examined duodenum.                           - No specimens collected. Moderate Sedation:      Moderate (conscious) sedation was administered by the endoscopy nurse       and supervised by the endoscopist. The following parameters were       monitored: oxygen saturation, heart rate, blood  pressure, and response       to care. Recommendation:           - Diabetic (ADA) diet.                           - Use Protonix (pantoprazole) 40 mg PO daily.                           - Post procedure medication orders were given. Procedure Code(s):        --- Professional ---                           [phone], Esophagogastroduodenoscopy, flexible,                            transoral; diagnostic, including collection of                            specimen(s) by brushing or washing, when performed                            (separate procedure) Diagnosis Code(s):         --- Professional ---                           K92.2, Gastrointestinal hemorrhage, unspecified                           K21.0, Gastro-esophageal reflux disease with                            esophagitis                           K22.10, Ulcer of esophagus without bleeding CPT copyright 2016 American Medical Association. All rights reserved. The codes documented in this report are preliminary and upon coder review may  be revised to meet current compliance requirements. Lear Ng, MD 12/28/2015 12:27:21 PM This report has been signed electronically. Number of Addenda: 0

## 2015-12-28 NOTE — Discharge Summary (Signed)
See separate discharge note

## 2015-12-28 NOTE — Discharge Summary (Signed)
Physician Discharge Summary  Patient ID: Carol QUIZHPI MRN: [id number] DOB/AGE: [birth year] 56 y.o.  Admit date: 12/27/2015 Discharge date: 12/28/2015  Primary Care Physician:  Andria Frames, MD   Discharge Diagnoses:  Gastro-intestinal/ hemorrhoidal bleeding; metastatic lung cancer  Present on Admission:  . Lower GI bleed  Discharge Medications:    Medication List    ASK your doctor about these medications        calcium-vitamin D 500-200 MG-UNIT tablet  Take 1 tablet by mouth daily.     CENTRUM SILVER PO  Take 1 tablet by mouth daily.     DULoxetine 60 MG capsule  Commonly known as:  CYMBALTA  Take 60 mg by mouth daily. Reported on 08/06/2015     fluticasone 50 MCG/ACT nasal spray  Commonly known as:  FLONASE  Place 2 sprays into both nostrils daily.     glipiZIDE 5 MG tablet  Commonly known as:  GLUCOTROL  Take 1 tablet (5 mg total) by mouth 2 (two) times daily before a meal.     ibuprofen 200 MG tablet  Commonly known as:  ADVIL,MOTRIN  Take 200-400 mg by mouth every 6 (six) hours as needed for headache or moderate pain.     LORazepam 0.5 MG tablet  Commonly known as:  ATIVAN  Take 1 tablet by mouth every 6 hours if needed for nausea or vomiting     meloxicam 15 MG tablet  Commonly known as:  MOBIC  Take 15 mg by mouth every other day.     montelukast 10 MG tablet  Commonly known as:  SINGULAIR  take 1 tablet by mouth at bedtime     omeprazole 20 MG capsule  Commonly known as:  PRILOSEC  Take 1 capsule (20 mg total) by mouth daily.     ondansetron 8 MG tablet  Commonly known as:  ZOFRAN  Take 8 mg by mouth 2 (two) times daily as needed for nausea or vomiting.     polyethylene glycol powder powder  Commonly known as:  MIRALAX  Take 17 g by mouth 2 (two) times daily.     predniSONE 20 MG tablet  Commonly known as:  DELTASONE  Take 2 tablets (40 mg total) by mouth daily with breakfast.     PROAIR HFA 108 (90 Base) MCG/ACT inhaler   Generic drug:  albuterol  inhale 1 puff every 4 hours if needed for shortness of breath     propranolol 10 MG tablet  Commonly known as:  INDERAL  Take 1 tablet (10 mg total) by mouth 3 (three) times daily as needed (TREMOR, AGITATION).     simvastatin 20 MG tablet  Commonly known as:  ZOCOR  Take 20 mg by mouth daily.     Tiotropium Bromide-Olodaterol 2.5-2.5 MCG/ACT Aers  Commonly known as:  STIOLTO RESPIMAT  Inhale 2 puffs into the lungs daily.     verapamil 180 MG (CO) 24 hr tablet  Commonly known as:  COVERA HS  Take 180 mg by mouth daily.     vitamin C 1000 MG tablet  Take 1,000 mg by mouth daily.     vitamin E 400 UNIT capsule  Take 400 Units by mouth daily.      anysol suppositories PRN   Disposition and Follow-up:  Call Dr Marcello Fennel for appointment  Significant Diagnostic Studies:  Dg Chest 2 View  12/24/2015  CLINICAL DATA:  History of lung cancer, COPD, diabetes, increasing shortness of breath. EXAM: CHEST  2 VIEW  COMPARISON:  12/13/2015, 11/25/2015 FINDINGS: Ill-defined left hilar masslike opacity obscures the left cardiac border compatible with the known underlying left upper lobe mass and surrounding radiation pneumonitis. Difficult to exclude pneumonia. Right lung remains clear. No developing effusion or pneumothorax. Trachea is midline. Stable mild cardiac enlargement. No superimposed edema. Aorta is atherosclerotic. Degenerative changes of the spine. IMPRESSION: Left hilar masslike opacity correlates with the known left upper lobe mass and surrounding left hilar and lingula radiation change or pneumonia. Little interval change compared 12/13/2015. Electronically Signed   By: Jerilynn Mages.  Shick M.D.   On: 12/24/2015 17:07   Dg Chest 2 View  12/13/2015  CLINICAL DATA:  Pneumonitis, followup hospitalization, hypertension, COPD, lung cancer post radiation therapy, former smoker, diabetes mellitus EXAM: CHEST  2 VIEW COMPARISON:  11/30/2015 FINDINGS: Upper normal heart  size. Prominent soft tissue at LEFT hilum with infiltrate in perihilar region into lingula, could be related to radiation pneumonitis or infection. Minimal chronic accentuation of interstitial markings at the RIGHT base. Remaining lungs clear. No definite pleural effusion or pneumothorax. Osseous structures unremarkable. IMPRESSION: Prominent density at LEFT hilum and perihilar region into lingula which may represent radiation pneumonitis or infection. Little interval change. Electronically Signed   By: Lavonia Dana M.D.   On: 12/13/2015 15:28   Dg Chest Port 1 View  11/30/2015  CLINICAL DATA:  Shortness of breath.  History of lung cancer EXAM: PORTABLE CHEST 1 VIEW COMPARISON:  11/28/2015 FINDINGS: Unchanged diffuse interstitial opacity with asymmetric involvement in the left mid and basilar lung. Normal heart size. No effusion or pneumothorax. IMPRESSION: Diffuse interstitial and ground-glass lung opacity by 11/25/2015 CT. Stable appearance since that time. Electronically Signed   By: Monte Fantasia M.D.   On: 11/30/2015 08:23   Colonoscopy: showed hemorrhoids as source of bleeding  EGD: showed gastritis   Discharge Laboratory Values: Basic Metabolic Panel:  Recent Labs Lab 12/24/15 1550 12/26/15 1413 12/27/15 1145  NA 137 141 138  K 4.1 4.3 4.0  CL 103 103 106  CO2 20* 26 23  GLUCOSE 211* 112 86  BUN '17 13 14  '$ CREATININE 1.00 0.8 0.75  CALCIUM 9.0 9.9 8.8*   GFR Estimated Creatinine Clearance: 81 mL/min (by C-G formula based on Cr of 0.75). Liver Function Tests:  Recent Labs Lab 12/24/15 1550 12/26/15 1413 12/27/15 1145  AST 32 30 22  ALT 39 45 34  ALKPHOS 52 43 42  BILITOT 0.6 0.90 0.7  PROT 6.7 6.9 6.6  ALBUMIN 4.1 4.1 4.0   No results for input(s): LIPASE, AMYLASE in the last 168 hours. No results for input(s): AMMONIA in the last 168 hours. Coagulation profile  Recent Labs Lab 12/27/15 1145  INR 1.06    CBC:  Recent Labs Lab 12/24/15 1550 12/24/15 1959  12/26/15 1413 12/27/15 1145 12/28/15 0341  WBC 8.4  --  8.9 7.6 4.5  NEUTROABS  --   --  7.8*  --  3.4  HGB 10.8* 10.6* 11.4* 10.2* 9.3*  HCT 33.0* 31.1* 34.3* 31.1* 28.4*  MCV 102.5*  --  104* 99.7 99.6  PLT 268  --  263 224 220   Cardiac Enzymes: No results for input(s): CKTOTAL, CKMB, CKMBINDEX, TROPONINI in the last 168 hours. BNP: Invalid input(s): POCBNP CBG: No results for input(s): GLUCAP in the last 168 hours. D-Dimer No results for input(s): DDIMER in the last 72 hours. Hgb A1c No results for input(s): HGBA1C in the last 72 hours. Lipid Profile No results for input(s): CHOL, HDL, LDLCALC,  TRIG, CHOLHDL, LDLDIRECT in the last 72 hours. Thyroid function studies No results for input(s): TSH, T4TOTAL, T3FREE, THYROIDAB in the last 72 hours.  Invalid input(s): FREET3 Anemia work up No results for input(s): VITAMINB12, FOLATE, FERRITIN, TIBC, IRON, RETICCTPCT in the last 72 hours. Sepsis Labs Invalid input(s): PROCALCITONIN,  WBC,  LACTICIDVEN Microbiology No results found for this or any previous visit (from the past 240 hour(s)).   Brief H and P: For complete details please refer to admission H and P, but in brief, the patient was admitted for evaluations of gastrointestinal bleeding in the setting of metastatic lung cancer.  Physical Exam at Discharge: BP 147/72 mmHg  Pulse 118  Temp(Src) 98.6 F (37 C) (Oral)  Resp 21  SpO2 93% Gen: well-developed White female Cardiovascular:RRR Respiratory:no rales or wheezes Gastrointestinal:NO, +BS Extremities: no edema    Hospital Course:  Active Problems:   Lower GI bleed Metastatic breast cancer  55 y.o. 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 Lurline Del [phone]  12/28/2015, 1:36 PM

## 2015-12-28 NOTE — Progress Notes (Signed)
Nursing Discharge Summary  Patient ID: Carol Buchanan MRN: [id number] DOB/AGE: 01-05-1961 55 y.o.  Admit date: 12/27/2015 Discharge date: 12/28/2015  Discharged Condition: good  Disposition: 01-Home or Self Care    Prescriptions Given: Patient prescriptions called in to pharmacy.  Patient follow up appointments and medications discussed. Patient and mother verbalized understanding without further questions.   Means of Discharge: Patient taken downstairs via wheelchair to be discharged home.   Signed: Buel Ream 12/28/2015, 2:40 PM

## 2015-12-28 NOTE — Interval H&P Note (Signed)
History and Physical Interval Note:  12/28/2015 11:24 AM  Carol Buchanan  has presented today for surgery, with the diagnosis of hematochezia  The various methods of treatment have been discussed with the patient and family. After consideration of risks, benefits and other options for treatment, the patient has consented to  Procedure(s): COLONOSCOPY (Left) ESOPHAGOGASTRODUODENOSCOPY (EGD) (N/A) as a surgical intervention .  The patient's history has been reviewed, patient examined, no change in status, stable for surgery.  I have reviewed the patient's chart and labs.  Questions were answered to the patient's satisfaction.     Poston C.

## 2015-12-28 NOTE — H&P (View-Only) (Signed)
[facility] Consultation Note  Referring Provider: Dr. Burney Gauze Primary Care Physician:  Andria Frames, MD  Reason for Consultation:  Hematochezia  HPI: Carol Buchanan is a 55 y.o. female whom we've been asked to see for hematochezia.  Patient has history of metastatic lung cancer, has received radiation therapy, and has been under care of Dr. Marin Olp for systemic chemotherapy.  Patient for the past one month has had escalating constipation (despite Miralax) and fecal straining and red blood around formed stool.  Some abdominal distention but no significant abdominal pain.  No nausea or vomiting or hematemesis or melena.  She had colonoscopy in November 2012 with Dr. Oletta Lamas which showed moderate internal hemorrhoids otherwise unrevealing.  Dr. Marin Olp needs to elucidate the source of the bleeding before patient will be able to receive any further chemotherapy until the source of the bleeding is identified.  Past Medical History  Diagnosis Date  . Hypertension   . Hyperlipidemia   . Sinus trouble   . Neuropathy ([place])   . COPD (chronic obstructive pulmonary disease) ([place])   . Primary lung cancer with metastasis from lung to other site [facility]) 07/26/2015  . Radiation 08/06/15-08/23/15    left lung mass, T4 spinal metastasis and left rib metastasis 35 gray  . DM (diabetes mellitus), type 2, uncontrolled ([place]) 11/26/2015    Past Surgical History  Procedure Laterality Date  . Tubal ligation  1994  . Back surgery  2015  . Video bronchoscopy Bilateral 07/01/2015    Procedure: VIDEO BRONCHOSCOPY WITHOUT FLUORO;  Surgeon: Collene Gobble, MD;  Location: [facility];  Service: Cardiopulmonary;  Laterality: Bilateral;    Prior to Admission medications   Medication Sig Start Date End Date Taking? Authorizing Provider  Ascorbic Acid (VITAMIN C) 1000 MG tablet Take 1,000 mg by mouth daily.   Yes Historical Provider, MD  Calcium Carbonate-Vitamin D (CALCIUM-VITAMIN D) 500-200 MG-UNIT  tablet Take 1 tablet by mouth daily.   Yes Historical Provider, MD  DULoxetine (CYMBALTA) 60 MG capsule Take 60 mg by mouth daily. Reported on 08/06/2015 08/02/15  Yes Historical Provider, MD  fluticasone (FLONASE) 50 MCG/ACT nasal spray Place 2 sprays into both nostrils daily. Patient taking differently: Place 2 sprays into both nostrils daily as needed for allergies.  08/29/15  Yes Collene Gobble, MD  glipiZIDE (GLUCOTROL) 5 MG tablet Take 1 tablet (5 mg total) by mouth 2 (two) times daily before a meal. 12/02/15  Yes Debbe Odea, MD  ibuprofen (ADVIL,MOTRIN) 200 MG tablet Take 200-400 mg by mouth every 6 (six) hours as needed for headache or moderate pain.   Yes Historical Provider, MD  LORazepam (ATIVAN) 0.5 MG tablet Take 1 tablet by mouth every 6 hours if needed for nausea or vomiting 12/26/15  Yes Volanda Napoleon, MD  meloxicam (MOBIC) 15 MG tablet Take 15 mg by mouth every other day.   Yes Historical Provider, MD  montelukast (SINGULAIR) 10 MG tablet take 1 tablet by mouth at bedtime 12/24/15  Yes Eliezer Bottom, NP  Multiple Vitamins-Minerals (CENTRUM SILVER PO) Take 1 tablet by mouth daily.    Yes Historical Provider, MD  omeprazole (PRILOSEC) 20 MG capsule Take 1 capsule (20 mg total) by mouth daily. 12/02/15  Yes Debbe Odea, MD  ondansetron (ZOFRAN) 8 MG tablet Take 8 mg by mouth 2 (two) times daily as needed for nausea or vomiting.  12/24/15  Yes Historical Provider, MD  polyethylene glycol powder (MIRALAX) powder Take 17 g by mouth 2 (two) times  daily. Patient taking differently: Take 17 g by mouth daily as needed for moderate constipation.  11/25/15  Yes Volanda Napoleon, MD  predniSONE (DELTASONE) 20 MG tablet Take 2 tablets (40 mg total) by mouth daily with breakfast. Patient taking differently: Take 20 mg by mouth daily with breakfast.  12/02/15  Yes Debbe Odea, MD  PROAIR HFA 108 (90 BASE) MCG/ACT inhaler inhale 1 puff every 4 hours if needed for shortness of breath Patient taking  differently: inhale 1 puff into the lungs every 4 hours if needed for shortness of breath 05/13/15  Yes Collene Gobble, MD  simvastatin (ZOCOR) 20 MG tablet Take 20 mg by mouth daily.    Yes Historical Provider, MD  Tiotropium Bromide-Olodaterol (STIOLTO RESPIMAT) 2.5-2.5 MCG/ACT AERS Inhale 2 puffs into the lungs daily. 08/29/15  Yes Collene Gobble, MD  verapamil (COVERA HS) 180 MG (CO) 24 hr tablet Take 180 mg by mouth daily.    Yes Historical Provider, MD  vitamin E 400 UNIT capsule Take 400 Units by mouth daily.   Yes Historical Provider, MD  propranolol (INDERAL) 10 MG tablet Take 1 tablet (10 mg total) by mouth 3 (three) times daily as needed (TREMOR, AGITATION). Patient not taking: Reported on 12/27/2015 12/02/15   Debbe Odea, MD    Current Facility-Administered Medications  Medication Dose Route Frequency Provider Last Rate Last Dose  . 0.9 %  sodium chloride infusion   Intravenous Continuous Volanda Napoleon, MD 50 mL/hr at 12/27/15 1201    . albuterol (PROVENTIL) (2.5 MG/3ML) 0.083% nebulizer solution 3 mL  3 mL Inhalation Q6H Volanda Napoleon, MD      . antiseptic oral rinse (CPC / CETYLPYRIDINIUM CHLORIDE 0.05%) solution 7 mL  7 mL Mouth Rinse q12n4p Volanda Napoleon, MD   7 mL at 12/27/15 1353  . chlorhexidine (PERIDEX) 0.12 % solution 15 mL  15 mL Mouth Rinse BID Volanda Napoleon, MD   15 mL at 12/27/15 1410  . [START ON 12/28/2015] DULoxetine (CYMBALTA) DR capsule 60 mg  60 mg Oral Daily Volanda Napoleon, MD      . fluticasone (FLONASE) 50 MCG/ACT nasal spray 2 spray  2 spray Each Nare Daily Volanda Napoleon, MD      . glipiZIDE (GLUCOTROL) tablet 5 mg  5 mg Oral BID AC Volanda Napoleon, MD      . ibuprofen (ADVIL,MOTRIN) tablet 200-400 mg  200-400 mg Oral Q6H PRN Volanda Napoleon, MD      . LORazepam (ATIVAN) tablet 0.5 mg  0.5 mg Oral Q6H PRN Volanda Napoleon, MD      . Derrill Memo ON 12/28/2015] meloxicam (MOBIC) tablet 15 mg  15 mg Oral QODAY Volanda Napoleon, MD      . montelukast (SINGULAIR)  tablet 10 mg  10 mg Oral QHS Volanda Napoleon, MD      . ondansetron [facility]) tablet 8 mg  8 mg Oral BID PRN Volanda Napoleon, MD      . Derrill Memo ON 12/28/2015] pantoprazole (PROTONIX) EC tablet 40 mg  40 mg Oral Daily Volanda Napoleon, MD      . polyethylene glycol (MIRALAX / GLYCOLAX) packet 17 g  17 g Oral BID Volanda Napoleon, MD   17 g at 12/27/15 1353  . [START ON 12/28/2015] predniSONE (DELTASONE) tablet 20 mg  20 mg Oral Q breakfast Volanda Napoleon, MD      . Derrill Memo ON 12/28/2015] verapamil (CALAN-SR) CR tablet 180 mg  180 mg Oral Daily Volanda Napoleon, MD        Allergies as of 12/26/2015  . (No Known Allergies)    Family History  Problem Relation Age of Onset  . Heart disease Father   . Heart attack Father   . Arthritis Mother     Social History   Social History  . Marital Status: Single    Spouse Name: N/A  . Number of Children: 0  . Years of Education: 13   Occupational History  . unemployed    Social History Main Topics  . Smoking status: Former Smoker -- 1.50 packs/day for 35 years    Types: Cigarettes    Quit date: 01/26/2011  . Smokeless tobacco: Never Used  . Alcohol Use: 1.2 oz/week    2 Standard drinks or equivalent per week     Comment: on weekends  . Drug Use: No  . Sexual Activity: No   Other Topics Concern  . Not on file   Social History Narrative   Patient lives at home with her mother.   Patient is not working   Right handed   Education- One year of college    Review of Systems: As per HPI, all others negative Physical Exam: Vital signs in last 24 hours: Temp:  [97.7 F (36.5 C)] 97.7 F (36.5 C) (06/01 1434) Pulse Rate:  [107-110] 110 (06/02 1031) Resp:  [20] 20 (06/02 1031) BP: (145-153)/(76-79) 153/76 mmHg (06/02 1031) SpO2:  [99 %] 99 % (06/02 1031) Weight:  [79.379 kg (175 lb)] 79.379 kg (175 lb) (06/01 1434)   General:   Alert, overweight, older-appearing than stated age, not acutely toxic-appearing Head:  Normocephalic and  atraumatic. Eyes:  Sclera clear, no icterus.   Conjunctiva pink. Ears:  Normal auditory acuity. Nose:  No deformity, discharge,  or lesions. Mouth:  No deformity or lesions.  Oropharynx pink & moist. Neck:  Supple; no masses or thyromegaly. Lungs:  Diminished aeration throughout, otherwise clear throughout to auscultation.   No wheezes, crackles, or rhonchi. No acute distress. Heart:  Regular rate and rhythm; no murmurs, clicks, rubs,  or gallops. Abdomen:  Soft, protuberant, nontender, mildly distended. No masses, hepatosplenomegaly or hernias noted. Normal bowel sounds, without guarding, and without rebound.     Msk:  Symmetrical without gross deformities. Normal posture. Pulses:  Normal pulses noted. Extremities:  Without clubbing or edema. Neurologic:  Alert and  oriented x4; diffusely weak, otherwise grossly normal neurologically. Skin:  Scattered bruises; otherwise intact without significant lesions or rashes. Psych:  Alert and cooperative. Normal mood and affect.   Lab Results:  Recent Labs  12/24/15 1550 12/24/15 1959 12/26/15 1413 12/27/15 1145  WBC 8.4  --  8.9 7.6  HGB 10.8* 10.6* 11.4* 10.2*  HCT 33.0* 31.1* 34.3* 31.1*  PLT 268  --  263 224   BMET  Recent Labs  12/24/15 1550 12/26/15 1413 12/27/15 1145  NA 137 141 138  K 4.1 4.3 4.0  CL 103 103 106  CO2 20* 26 23  GLUCOSE 211* 112 86  BUN '17 13 14  '$ CREATININE 1.00 0.8 0.75  CALCIUM 9.0 9.9 8.8*   LFT  Recent Labs  12/27/15 1145  PROT 6.6  ALBUMIN 4.0  AST 22  ALT 34  ALKPHOS 42  BILITOT 0.7   PT/INR  Recent Labs  12/27/15 1145  LABPROT 13.6  INR 1.06    Studies/Results: No results found.  Impression:  1.  Hematochezia.  In conjunction with constipation  and straining, as well as patient's prior history of moderate internal hemorrhoids seen on her last colonoscopy, I would suspect anorectal source (i.e., hemorrhoids) for patient's bleeding. 2.  Anemia, possible contribution from  bleeding, but likely multifactorial. 3.  Constipation. 4.  Metastatic lung cancer.  Dr. Marin Olp would like bleeding source elucidated prior to resuming chemotherapy.  Plan:  1.  Colonoscopy tomorrow with Dr. Michail Sermon at 930. 2.  Patient did not seem to have much success with Miralax 17 g po bid.  Might consider lubiprostone 8 mcg po bid with meals pending colonoscopy findings. 3.  Next step in management is pending colonoscopy findings.     Landry Dyke  12/27/2015, 2:33 PM  Pager [phone] If no answer or after 5 PM call [phone]

## 2015-12-28 NOTE — Progress Notes (Signed)
Carol Buchanan   DOB:1961-02-16   WU#:[id number]   GNF#:[id number]  Subjective: Carol Buchanan is sitting up waiting for procedure "so I can get home." She has some low back pain "when I stand too long" but she seldom takes any medication for that and then NSAIDS. Rest of ROS is noncontributory and specifically no symptoms from her moderate anemia. No family in room   Objective: middle aged white woman examined at bedside Filed Vitals:   12/27/15 2047 12/28/15 0437  BP: 144/80 142/88  Pulse: 99 94  Temp: 97.9 F (36.6 C) 98.6 F (37 C)  Resp: 18 16    There is no weight on file to calculate BMI.  Intake/Output Summary (Last 24 hours) at 12/28/15 0847 Last data filed at 12/27/15 1644  Gross per 24 hour  Intake    360 ml  Output      3 ml  Net    357 ml     Sclerae unicteric  No peripheral adenopathy  Lungs clear -- no rales or rhonchi  Heart regular rate and rhythm  Abdomen soft, NT, +BS  MSK no focal spinal tenderness to moderate palpation  Neuro nonfocal  Skin: rosacea  CBG (last 3)  No results for input(s): GLUCAP in the last 72 hours.   Labs:  Lab Results  Component Value Date   WBC 4.5 12/28/2015   HGB 9.3* 12/28/2015   HCT 28.4* 12/28/2015   MCV 99.6 12/28/2015   PLT 220 12/28/2015   NEUTROABS 3.4 12/28/2015    '@LASTCHEMISTRY'$ @  Urine Studies No results for input(s): UHGB, CRYS in the last 72 hours.  Invalid input(s): UACOL, UAPR, USPG, UPH, UTP, UGL, UKET, UBIL, UNIT, UROB, ULEU, UEPI, UWBC, URBC, UBAC, CAST, UCOM, [place]  Basic Metabolic Panel:  Recent Labs Lab 12/24/15 1550 12/26/15 1413 12/27/15 1145  NA 137 141 138  K 4.1 4.3 4.0  CL 103 103 106  CO2 20* 26 23  GLUCOSE 211* 112 86  BUN '17 13 14  '$ CREATININE 1.00 0.8 0.75  CALCIUM 9.0 9.9 8.8*   GFR Estimated Creatinine Clearance: 81 mL/min (by C-G formula based on Cr of 0.75). Liver Function Tests:  Recent Labs Lab 12/24/15 1550 12/26/15 1413 12/27/15 1145  AST 32 30 22  ALT 39 45 34   ALKPHOS 52 43 42  BILITOT 0.6 0.90 0.7  PROT 6.7 6.9 6.6  ALBUMIN 4.1 4.1 4.0   No results for input(s): LIPASE, AMYLASE in the last 168 hours. No results for input(s): AMMONIA in the last 168 hours. Coagulation profile  Recent Labs Lab 12/27/15 1145  INR 1.06    CBC:  Recent Labs Lab 12/24/15 1550 12/24/15 1959 12/26/15 1413 12/27/15 1145 12/28/15 0341  WBC 8.4  --  8.9 7.6 4.5  NEUTROABS  --   --  7.8*  --  3.4  HGB 10.8* 10.6* 11.4* 10.2* 9.3*  HCT 33.0* 31.1* 34.3* 31.1* 28.4*  MCV 102.5*  --  104* 99.7 99.6  PLT 268  --  263 224 220   Cardiac Enzymes: No results for input(s): CKTOTAL, CKMB, CKMBINDEX, TROPONINI in the last 168 hours. BNP: Invalid input(s): POCBNP CBG: No results for input(s): GLUCAP in the last 168 hours. D-Dimer No results for input(s): DDIMER in the last 72 hours. Hgb A1c No results for input(s): HGBA1C in the last 72 hours. Lipid Profile No results for input(s): CHOL, HDL, LDLCALC, TRIG, CHOLHDL, LDLDIRECT in the last 72 hours. Thyroid function studies No results for input(s): TSH, T4TOTAL, T3FREE,  THYROIDAB in the last 72 hours.  Invalid input(s): FREET3 Anemia work up No results for input(s): VITAMINB12, FOLATE, FERRITIN, TIBC, IRON, RETICCTPCT in the last 72 hours. Microbiology No results found for this or any previous visit (from the past 240 hour(s)).    Studies:  No results found.  Assessment: 55 y.o. 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 She is already scheduled for port placement 12/30/2015. She tells me the plan is to start chemotherapy the following week. She will call Dr Antonieta Pert office for appointment.    Chauncey Cruel, MD 12/28/2015  8:47 AM Medical Oncology and Hematology [place] [address] [place], [place] [address] Tel. [phone]    Fax. [phone]

## 2015-12-30 ENCOUNTER — Encounter (HOSPITAL_COMMUNITY): Payer: Self-pay

## 2015-12-30 ENCOUNTER — Ambulatory Visit (HOSPITAL_COMMUNITY)
Admission: AD | Admit: 2015-12-30 | Discharge: 2015-12-30 | Disposition: A | Discharge status: Home / Self Care | Payer: BLUE CROSS/BLUE SHIELD | Source: Ambulatory Visit | Attending: Hematology & Oncology | Admitting: Hematology & Oncology

## 2015-12-30 ENCOUNTER — Other Ambulatory Visit: Payer: Self-pay | Admitting: Hematology & Oncology

## 2015-12-30 ENCOUNTER — Encounter: Payer: Self-pay | Admitting: Nurse Practitioner

## 2015-12-30 DIAGNOSIS — E785 Hyperlipidemia, unspecified: Secondary | ICD-10-CM | POA: Insufficient documentation

## 2015-12-30 DIAGNOSIS — C3492 Malignant neoplasm of unspecified part of left bronchus or lung: Secondary | ICD-10-CM

## 2015-12-30 DIAGNOSIS — E119 Type 2 diabetes mellitus without complications: Secondary | ICD-10-CM | POA: Insufficient documentation

## 2015-12-30 DIAGNOSIS — Z923 Personal history of irradiation: Secondary | ICD-10-CM | POA: Insufficient documentation

## 2015-12-30 DIAGNOSIS — Z853 Personal history of malignant neoplasm of breast: Secondary | ICD-10-CM | POA: Diagnosis not present

## 2015-12-30 DIAGNOSIS — Z85830 Personal history of malignant neoplasm of bone: Secondary | ICD-10-CM | POA: Diagnosis not present

## 2015-12-30 DIAGNOSIS — I10 Essential (primary) hypertension: Secondary | ICD-10-CM | POA: Diagnosis not present

## 2015-12-30 DIAGNOSIS — J449 Chronic obstructive pulmonary disease, unspecified: Secondary | ICD-10-CM | POA: Diagnosis not present

## 2015-12-30 DIAGNOSIS — R0600 Dyspnea, unspecified: Secondary | ICD-10-CM

## 2015-12-30 DIAGNOSIS — C7800 Secondary malignant neoplasm of unspecified lung: Secondary | ICD-10-CM | POA: Insufficient documentation

## 2015-12-30 DIAGNOSIS — Z85118 Personal history of other malignant neoplasm of bronchus and lung: Secondary | ICD-10-CM | POA: Insufficient documentation

## 2015-12-30 DIAGNOSIS — Z87891 Personal history of nicotine dependence: Secondary | ICD-10-CM | POA: Diagnosis not present

## 2015-12-30 LAB — CBC
HCT: 31.1 % — ABNORMAL LOW (ref 36.0–46.0)
Hemoglobin: 10.3 g/dL — ABNORMAL LOW (ref 12.0–15.0)
MCH: 33.1 pg (ref 26.0–34.0)
MCHC: 33.1 g/dL (ref 30.0–36.0)
MCV: 100 fL (ref 78.0–100.0)
PLATELETS: 232 10*3/uL (ref 150–400)
RBC: 3.11 MIL/uL — AB (ref 3.87–5.11)
RDW: 18 % — AB (ref 11.5–15.5)
WBC: 9.1 10*3/uL (ref 4.0–10.5)

## 2015-12-30 MED ORDER — LIDOCAINE-EPINEPHRINE (PF) 1 %-1:200000 IJ SOLN
INTRAMUSCULAR | Status: AC | PRN
  Administered 2015-12-30: 10 mL via INTRADERMAL

## 2015-12-30 MED ORDER — LIDOCAINE-EPINEPHRINE (PF) 2 %-1:200000 IJ SOLN
INTRAMUSCULAR | Status: AC
  Filled 2015-12-30: qty 20

## 2015-12-30 MED ORDER — CEFAZOLIN SODIUM-DEXTROSE 2-4 GM/100ML-% IV SOLN
2.0000 g | INTRAVENOUS | Status: AC
  Administered 2015-12-30: 2 g via INTRAVENOUS
  Filled 2015-12-30: qty 100

## 2015-12-30 MED ORDER — HEPARIN SOD (PORK) LOCK FLUSH 100 UNIT/ML IV SOLN
INTRAVENOUS | Status: AC | PRN
  Administered 2015-12-30: 500 [IU]

## 2015-12-30 MED ORDER — MIDAZOLAM HCL 2 MG/2ML IJ SOLN
INTRAMUSCULAR | Status: AC | PRN
  Administered 2015-12-30: 0.5 mg via INTRAVENOUS
  Administered 2015-12-30: 1 mg via INTRAVENOUS

## 2015-12-30 MED ORDER — HEPARIN SOD (PORK) LOCK FLUSH 100 UNIT/ML IV SOLN
INTRAVENOUS | Status: AC
  Filled 2015-12-30: qty 5

## 2015-12-30 MED ORDER — FENTANYL CITRATE (PF) 100 MCG/2ML IJ SOLN
INTRAMUSCULAR | Status: AC | PRN
  Administered 2015-12-30 (×3): 25 ug via INTRAVENOUS

## 2015-12-30 MED ORDER — FENTANYL CITRATE (PF) 100 MCG/2ML IJ SOLN
INTRAMUSCULAR | Status: AC
  Filled 2015-12-30: qty 4

## 2015-12-30 MED ORDER — MIDAZOLAM HCL 2 MG/2ML IJ SOLN
INTRAMUSCULAR | Status: AC
  Filled 2015-12-30: qty 6

## 2015-12-30 MED ORDER — SODIUM CHLORIDE 0.9 % IV SOLN
INTRAVENOUS | Status: DC
  Administered 2015-12-30: 13:00:00 via INTRAVENOUS

## 2015-12-30 NOTE — Procedures (Signed)
Successful placement of right IJ approach port-a-cath with tip at the superior caval atrial junction. The catheter is ready for immediate use. Estimated Blood Loss: Minimal No immediate post procedural complications.  Jay Nalini Alcaraz, MD Pager #: [phone]   

## 2015-12-30 NOTE — Discharge Instructions (Signed)
Implanted Port Home Guide °An implanted port is a type of central line that is placed under the skin. Central lines are used to provide IV access when treatment or nutrition needs to be given through a person's veins. Implanted ports are used for long-term IV access. An implanted port may be placed because:  °· You need IV medicine that would be irritating to the small veins in your hands or arms.   °· You need long-term IV medicines, such as antibiotics.   °· You need IV nutrition for a long period.   °· You need frequent blood draws for lab tests.   °· You need dialysis.   °Implanted ports are usually placed in the chest area, but they can also be placed in the upper arm, the abdomen, or the leg. An implanted port has two main parts:  °· Reservoir. The reservoir is round and will appear as a small, raised area under your skin. The reservoir is the part where a needle is inserted to give medicines or draw blood.   °· Catheter. The catheter is a thin, flexible tube that extends from the reservoir. The catheter is placed into a large vein. Medicine that is inserted into the reservoir goes into the catheter and then into the vein.   °HOW WILL I CARE FOR MY INCISION SITE? °Do not get the incision site wet. Bathe or shower as directed by your health care provider.  °HOW IS MY PORT ACCESSED? °Special steps must be taken to access the port:  °· Before the port is accessed, a numbing cream can be placed on the skin. This helps numb the skin over the port site.   °· Your health care provider uses a sterile technique to access the port. °· Your health care provider must put on a mask and sterile gloves. °· The skin over your port is cleaned carefully with an antiseptic and allowed to dry. °· The port is gently pinched between sterile gloves, and a needle is inserted into the port. °· Only "non-coring" port needles should be used to access the port. Once the port is accessed, a blood return should be checked. This helps  ensure that the port is in the vein and is not clogged.   °· If your port needs to remain accessed for a constant infusion, a clear (transparent) bandage will be placed over the needle site. The bandage and needle will need to be changed every week, or as directed by your health care provider.   °· Keep the bandage covering the needle clean and dry. Do not get it wet. Follow your health care provider's instructions on how to take a shower or bath while the port is accessed.   °· If your port does not need to stay accessed, no bandage is needed over the port.   °WHAT IS FLUSHING? °Flushing helps keep the port from getting clogged. Follow your health care provider's instructions on how and when to flush the port. Ports are usually flushed with saline solution or a medicine called heparin. The need for flushing will depend on how the port is used.  °· If the port is used for intermittent medicines or blood draws, the port will need to be flushed:   °· After medicines have been given.   °· After blood has been drawn.   °· As part of routine maintenance.   °· If a constant infusion is running, the port may not need to be flushed.   °HOW LONG WILL MY PORT STAY IMPLANTED? °The port can stay in for as long as your health care   provider thinks it is needed. When it is time for the port to come out, surgery will be done to remove it. The procedure is similar to the one performed when the port was put in.  WHEN SHOULD I SEEK IMMEDIATE MEDICAL CARE? When you have an implanted port, you should seek immediate medical care if:   You notice a bad smell coming from the incision site.   You have swelling, redness, or drainage at the incision site.   You have more swelling or pain at the port site or the surrounding area.   You have a fever that is not controlled with medicine.   This information is not intended to replace advice given to you by your health care provider. Make sure you discuss any questions you have with  your health care provider.   Document Released: 07/13/2005 Document Revised: 05/03/2013 Document Reviewed: 03/20/2013 Elsevier Interactive Patient Education 2016 [employer] Moderate Conscious Sedation, Adult, Care After Refer to this sheet in the next few weeks. These instructions provide you with information on caring for yourself after your procedure. Your health care provider may also give you more specific instructions. Your treatment has been planned according to current medical practices, but problems sometimes occur. Call your health care provider if you have any problems or questions after your procedure. WHAT TO EXPECT AFTER THE PROCEDURE  After your procedure:  You may feel sleepy, clumsy, and have poor balance for several hours.  Vomiting may occur if you eat too soon after the procedure. HOME CARE INSTRUCTIONS  Do not participate in any activities where you could become injured for at least 24 hours. Do not:  Drive.  Swim.  Ride a bicycle.  Operate heavy machinery.  Cook.  Use power tools.  Climb ladders.  Work from a high place.  Do not make important decisions or sign legal documents until you are improved.  If you vomit, drink water, juice, or soup when you can drink without vomiting. Make sure you have little or no nausea before eating solid foods.  Only take over-the-counter or prescription medicines for pain, discomfort, or fever as directed by your health care provider.  Make sure you and your family fully understand everything about the medicines given to you, including what side effects may occur.  You should not drink alcohol, take sleeping pills, or take medicines that cause drowsiness for at least 24 hours.  If you smoke, do not smoke without supervision.  If you are feeling better, you may resume normal activities 24 hours after you were sedated.  Keep all appointments with your health care provider. SEEK MEDICAL CARE IF:  Your skin is  pale or bluish in color.  You continue to feel nauseous or vomit.  Your pain is getting worse and is not helped by medicine.  You have bleeding or swelling.  You are still sleepy or feeling clumsy after 24 hours. SEEK IMMEDIATE MEDICAL CARE IF:  You develop a rash.  You have difficulty breathing.  You develop any type of allergic problem.  You have a fever. MAKE SURE YOU:  Understand these instructions.  Will watch your condition.  Will get help right away if you are not doing well or get worse.   This information is not intended to replace advice given to you by your health care provider. Make sure you discuss any questions you have with your health care provider.   Document Released: 05/03/2013 Document Revised: 08/03/2014 Document Reviewed: 05/03/2013 Elsevier Interactive Patient Education  2016 [place].

## 2015-12-30 NOTE — Consult Note (Signed)
Chief Complaint: Patient was seen in consultation today for port a cath placement Referring Physician(s): Ennever,Peter R  Supervising Physician: Sandi Mariscal  Patient Status: Out-pt  History of Present Illness: Carol Buchanan is a 55 y.o. female prior smoker with past medical history of hypertension, hyperlipidemia, COPD, diabetes and metastatic lung cancer, initially diagnosed in December 2016 (left upper lobe mass). She presents today for Port-A-Cath placement for chemotherapy.  Past Medical History  Diagnosis Date  . Hypertension   . Hyperlipidemia   . Sinus trouble   . Neuropathy ([place])   . COPD (chronic obstructive pulmonary disease) ([place])   . Primary lung cancer with metastasis from lung to other site [facility]) 07/26/2015  . Radiation 08/06/15-08/23/15    left lung mass, T4 spinal metastasis and left rib metastasis 35 gray  . DM (diabetes mellitus), type 2, uncontrolled ([place]) 11/26/2015    Past Surgical History  Procedure Laterality Date  . Tubal ligation  1994  . Back surgery  2015  . Video bronchoscopy Bilateral 07/01/2015    Procedure: VIDEO BRONCHOSCOPY WITHOUT FLUORO;  Surgeon: Collene Gobble, MD;  Location: [place];  Service: Cardiopulmonary;  Laterality: Bilateral;    Allergies: Review of patient's allergies indicates no known allergies.  Medications: Prior to Admission medications   Medication Sig Start Date End Date Taking? Authorizing Provider  Ascorbic Acid (VITAMIN C) 1000 MG tablet Take 1,000 mg by mouth daily.   Yes Historical Provider, MD  Calcium Carbonate-Vitamin D (CALCIUM-VITAMIN D) 500-200 MG-UNIT tablet Take 1 tablet by mouth daily.   Yes Historical Provider, MD  DULoxetine (CYMBALTA) 60 MG capsule Take 60 mg by mouth daily. Reported on 08/06/2015 08/02/15  Yes Historical Provider, MD  fluticasone (FLONASE) 50 MCG/ACT nasal spray Place 2 sprays into both nostrils daily. Patient taking differently: Place 2 sprays into both nostrils daily as  needed for allergies.  08/29/15  Yes Collene Gobble, MD  glipiZIDE (GLUCOTROL) 5 MG tablet Take 1 tablet (5 mg total) by mouth 2 (two) times daily before a meal. 12/02/15  Yes Debbe Odea, MD  glipiZIDE (GLUCOTROL) 5 MG tablet Take 1 tablet (5 mg total) by mouth 2 (two) times daily before a meal. 12/28/15  Yes Chauncey Cruel, MD  ibuprofen (ADVIL,MOTRIN) 200 MG tablet Take 200-400 mg by mouth every 6 (six) hours as needed for headache or moderate pain.   Yes Historical Provider, MD  ipratropium-albuterol (DUONEB) 0.5-2.5 (3) MG/3ML SOLN Take 3 mLs by nebulization 3 (three) times daily. 12/28/15  Yes Chauncey Cruel, MD  LORazepam (ATIVAN) 0.5 MG tablet Take 1 tablet by mouth every 6 hours if needed for nausea or vomiting 12/26/15  Yes Volanda Napoleon, MD  meloxicam (MOBIC) 15 MG tablet Take 15 mg by mouth every other day.   Yes Historical Provider, MD  montelukast (SINGULAIR) 10 MG tablet take 1 tablet by mouth at bedtime 12/24/15  Yes Eliezer Bottom, NP  Multiple Vitamins-Minerals (CENTRUM SILVER PO) Take 1 tablet by mouth daily.    Yes Historical Provider, MD  omeprazole (PRILOSEC) 20 MG capsule Take 1 capsule (20 mg total) by mouth daily. 12/02/15  Yes Debbe Odea, MD  ondansetron (ZOFRAN) 8 MG tablet Take 8 mg by mouth 2 (two) times daily as needed for nausea or vomiting.  12/24/15  Yes Historical Provider, MD  predniSONE (DELTASONE) 20 MG tablet Take 2 tablets (40 mg total) by mouth daily with breakfast. Patient taking differently: Take 20 mg by mouth daily with breakfast.  12/02/15  Yes Debbe Odea, MD  simvastatin (ZOCOR) 20 MG tablet Take 20 mg by mouth daily.    Yes Historical Provider, MD  Tiotropium Bromide-Olodaterol (STIOLTO RESPIMAT) 2.5-2.5 MCG/ACT AERS Inhale 2 puffs into the lungs daily. 08/29/15  Yes Collene Gobble, MD  verapamil (COVERA HS) 180 MG (CO) 24 hr tablet Take 180 mg by mouth daily.    Yes Historical Provider, MD  vitamin E 400 UNIT capsule Take 400 Units by mouth daily.   Yes  Historical Provider, MD  chlorhexidine (PERIDEX) 0.12 % solution 15 mLs by Mouth Rinse route 2 (two) times daily. 12/28/15   Chauncey Cruel, MD  hydrocortisone (ANUSOL-HC) 25 MG suppository Place 1 suppository (25 mg total) rectally 2 (two) times daily. 12/28/15   Chauncey Cruel, MD     Family History  Problem Relation Age of Onset  . Heart disease Father   . Heart attack Father   . Arthritis Mother     Social History   Social History  . Marital Status: Single    Spouse Name: N/A  . Number of Children: 0  . Years of Education: 13   Occupational History  . unemployed    Social History Main Topics  . Smoking status: Former Smoker -- 1.50 packs/day for 35 years    Types: Cigarettes    Quit date: 01/26/2011  . Smokeless tobacco: Never Used  . Alcohol Use: 1.2 oz/week    2 Standard drinks or equivalent per week     Comment: on weekends  . Drug Use: No  . Sexual Activity: No   Other Topics Concern  . None   Social History Narrative   Patient lives at home with her mother.   Patient is not working   Right handed   Education- One year of college      Review of Systems  Constitutional: Negative for fever and chills.  Respiratory:       Occ cough, dyspnea with exertion; on home O2  Cardiovascular: Negative for chest pain.  Gastrointestinal: Positive for blood in stool. Negative for nausea, vomiting and abdominal pain.       Hx hemorrhoids  Genitourinary: Negative for dysuria and hematuria.  Musculoskeletal: Positive for back pain.  Neurological: Negative for headaches.    Vital Signs: Blood pressure 142/83, heart rate 118, temperature 98.5, respirations 22, O2 sat 92% 4 L N/C   Physical Exam patient awake, alert. Chest with distant but clear breath sounds bilaterally; heart with tachycardic but regular rhythm. Abdomen obese, soft, positive bowel sounds, nontender. Lower extremities with no edema.  Mallampati Score:     Imaging: Dg Chest 2 View  12/24/2015   CLINICAL DATA:  History of lung cancer, COPD, diabetes, increasing shortness of breath. EXAM: CHEST  2 VIEW COMPARISON:  12/13/2015, 11/25/2015 FINDINGS: Ill-defined left hilar masslike opacity obscures the left cardiac border compatible with the known underlying left upper lobe mass and surrounding radiation pneumonitis. Difficult to exclude pneumonia. Right lung remains clear. No developing effusion or pneumothorax. Trachea is midline. Stable mild cardiac enlargement. No superimposed edema. Aorta is atherosclerotic. Degenerative changes of the spine. IMPRESSION: Left hilar masslike opacity correlates with the known left upper lobe mass and surrounding left hilar and lingula radiation change or pneumonia. Little interval change compared 12/13/2015. Electronically Signed   By: Jerilynn Mages.  Shick M.D.   On: 12/24/2015 17:07   Dg Chest 2 View  12/13/2015  CLINICAL DATA:  Pneumonitis, followup hospitalization, hypertension, COPD, lung cancer post radiation therapy,  former smoker, diabetes mellitus EXAM: CHEST  2 VIEW COMPARISON:  11/30/2015 FINDINGS: Upper normal heart size. Prominent soft tissue at LEFT hilum with infiltrate in perihilar region into lingula, could be related to radiation pneumonitis or infection. Minimal chronic accentuation of interstitial markings at the RIGHT base. Remaining lungs clear. No definite pleural effusion or pneumothorax. Osseous structures unremarkable. IMPRESSION: Prominent density at LEFT hilum and perihilar region into lingula which may represent radiation pneumonitis or infection. Little interval change. Electronically Signed   By: Lavonia Dana M.D.   On: 12/13/2015 15:28    Labs:  CBC:  Recent Labs  12/26/15 1413 12/27/15 1145 12/28/15 0341 12/30/15 1300  WBC 8.9 7.6 4.5 9.1  HGB 11.4* 10.2* 9.3* 10.3*  HCT 34.3* 31.1* 28.4* 31.1*  PLT 263 224 220 232    COAGS:  Recent Labs  12/27/15 1145  INR 1.06  APTT 25    BMP:  Recent Labs  11/29/15 0310 12/02/15 0535  12/24/15 1550 12/26/15 1413 12/27/15 1145  NA 139 138 137 141 138  K 4.0 4.4 4.1 4.3 4.0  CL 97* 101 103 103 106  CO2 27 27 20* 26 23  GLUCOSE 187* 197* 211* 112 86  BUN 31* 26* '17 13 14  '$ CALCIUM 9.3 8.5* 9.0 9.9 8.8*  CREATININE 0.92 0.78 1.00 0.8 0.75  GFRNONAA >60 >60 >60  --  >60  GFRAA >60 >60 >60  --  >60    LIVER FUNCTION TESTS:  Recent Labs  11/26/15 0254 12/24/15 1550 12/26/15 1413 12/27/15 1145  BILITOT 0.6 0.6 0.90 0.7  AST 37 32 30 22  ALT 27 39 45 34  ALKPHOS 74 52 43 42  PROT 6.8 6.7 6.9 6.6  ALBUMIN 3.2* 4.1 4.1 4.0    TUMOR MARKERS: No results for input(s): AFPTM, CEA, CA199, CHROMGRNA in the last 8760 hours.  Assessment and Plan: Patient with history of metastatic lung cancer (adenocarcinoma), initially diagnosed in 06/2015; status post chemoradiation; now with evidence of progression; patient presents today for Port-A-Cath placement for additional chemotherapy.Risks and benefits discussed with the patient/mother including, but not limited to bleeding, infection, pneumothorax, or fibrin sheath development and need for additional procedures.All of the patient's questions were answered, patient is agreeable to proceed.Consent signed and in chart.     Thank you for this interesting consult.  I greatly enjoyed meeting JAMIA HOBAN and look forward to participating in their care.  A copy of this report was sent to the requesting provider on this date.  Electronically Signed: D. Rowe Robert 12/30/2015, 1:36 PM   I spent a total of 20 minutes  in face to face in clinical consultation, greater than 50% of which was counseling/coordinating care for port a cath placement

## 2015-12-31 ENCOUNTER — Encounter (HOSPITAL_COMMUNITY): Payer: Self-pay | Admitting: Gastroenterology

## 2016-01-02 ENCOUNTER — Other Ambulatory Visit: Payer: BLUE CROSS/BLUE SHIELD

## 2016-01-03 ENCOUNTER — Other Ambulatory Visit: Payer: Self-pay | Admitting: *Deleted

## 2016-01-03 DIAGNOSIS — C3491 Malignant neoplasm of unspecified part of right bronchus or lung: Secondary | ICD-10-CM

## 2016-01-06 ENCOUNTER — Other Ambulatory Visit (HOSPITAL_BASED_OUTPATIENT_CLINIC_OR_DEPARTMENT_OTHER): Payer: BLUE CROSS/BLUE SHIELD

## 2016-01-06 ENCOUNTER — Other Ambulatory Visit: Payer: Self-pay | Admitting: *Deleted

## 2016-01-06 ENCOUNTER — Ambulatory Visit (HOSPITAL_BASED_OUTPATIENT_CLINIC_OR_DEPARTMENT_OTHER): Payer: BLUE CROSS/BLUE SHIELD

## 2016-01-06 DIAGNOSIS — R0600 Dyspnea, unspecified: Secondary | ICD-10-CM

## 2016-01-06 DIAGNOSIS — C3491 Malignant neoplasm of unspecified part of right bronchus or lung: Secondary | ICD-10-CM

## 2016-01-06 DIAGNOSIS — C3492 Malignant neoplasm of unspecified part of left bronchus or lung: Secondary | ICD-10-CM

## 2016-01-06 DIAGNOSIS — Z5111 Encounter for antineoplastic chemotherapy: Secondary | ICD-10-CM | POA: Diagnosis not present

## 2016-01-06 LAB — CMP (CANCER CENTER ONLY)
ALK PHOS: 47 U/L (ref 26–84)
ALT: 29 U/L (ref 10–47)
AST: 22 U/L (ref 11–38)
Albumin: 3.8 g/dL (ref 3.3–5.5)
BUN, Bld: 16 mg/dL (ref 7–22)
CALCIUM: 9.2 mg/dL (ref 8.0–10.3)
CO2: 30 mEq/L (ref 18–33)
Chloride: 99 mEq/L (ref 98–108)
Creat: 1 mg/dl (ref 0.6–1.2)
GLUCOSE: 73 mg/dL (ref 73–118)
POTASSIUM: 4.2 meq/L (ref 3.3–4.7)
Sodium: 137 mEq/L (ref 128–145)
Total Bilirubin: 0.8 mg/dl (ref 0.20–1.60)
Total Protein: 6.6 g/dL (ref 6.4–8.1)

## 2016-01-06 LAB — CBC WITH DIFFERENTIAL (CANCER CENTER ONLY)
BASO#: 0 10*3/uL (ref 0.0–0.2)
BASO%: 0.3 % (ref 0.0–2.0)
EOS%: 0.4 % (ref 0.0–7.0)
Eosinophils Absolute: 0 10*3/uL (ref 0.0–0.5)
HEMATOCRIT: 30.3 % — AB (ref 34.8–46.6)
HGB: 10.1 g/dL — ABNORMAL LOW (ref 11.6–15.9)
LYMPH#: 0.8 10*3/uL — AB (ref 0.9–3.3)
LYMPH%: 7.7 % — ABNORMAL LOW (ref 14.0–48.0)
MCH: 34.8 pg — ABNORMAL HIGH (ref 26.0–34.0)
MCHC: 33.3 g/dL (ref 32.0–36.0)
MCV: 105 fL — AB (ref 81–101)
MONO#: 0.7 10*3/uL (ref 0.1–0.9)
MONO%: 6.3 % (ref 0.0–13.0)
NEUT%: 85.3 % — AB (ref 39.6–80.0)
NEUTROS ABS: 8.9 10*3/uL — AB (ref 1.5–6.5)
Platelets: 233 10*3/uL (ref 145–400)
RBC: 2.9 10*6/uL — ABNORMAL LOW (ref 3.70–5.32)
RDW: 18.1 % — AB (ref 11.1–15.7)
WBC: 10.5 10*3/uL — AB (ref 3.9–10.0)

## 2016-01-06 LAB — TECHNOLOGIST REVIEW CHCC SATELLITE

## 2016-01-06 MED ORDER — PALONOSETRON HCL INJECTION 0.25 MG/5ML
INTRAVENOUS | Status: AC
  Filled 2016-01-06: qty 5

## 2016-01-06 MED ORDER — CYANOCOBALAMIN 1000 MCG/ML IJ SOLN
1000.0000 ug | Freq: Once | INTRAMUSCULAR | Status: AC
  Administered 2016-01-06: 1000 ug via INTRAMUSCULAR

## 2016-01-06 MED ORDER — SODIUM CHLORIDE 0.9 % IV SOLN
500.0000 mg/m2 | Freq: Once | INTRAVENOUS | Status: AC
  Administered 2016-01-06: 950 mg via INTRAVENOUS
  Filled 2016-01-06: qty 38

## 2016-01-06 MED ORDER — LORAZEPAM 0.5 MG PO TABS: ORAL_TABLET | ORAL | Status: DC

## 2016-01-06 MED ORDER — HEPARIN SOD (PORK) LOCK FLUSH 100 UNIT/ML IV SOLN
500.0000 [IU] | Freq: Once | INTRAVENOUS | Status: AC | PRN
  Administered 2016-01-06: 500 [IU]
  Filled 2016-01-06: qty 5

## 2016-01-06 MED ORDER — PALONOSETRON HCL INJECTION 0.25 MG/5ML
0.2500 mg | Freq: Once | INTRAVENOUS | Status: AC
  Administered 2016-01-06: 0.25 mg via INTRAVENOUS

## 2016-01-06 MED ORDER — FOLIC ACID 1 MG PO TABS
1.0000 mg | ORAL_TABLET | Freq: Every day | ORAL | Status: DC
Start: 2016-01-06 — End: 2016-11-05

## 2016-01-06 MED ORDER — SODIUM CHLORIDE 0.9 % IV SOLN
530.0000 mg | Freq: Once | INTRAVENOUS | Status: AC
  Administered 2016-01-06: 530 mg via INTRAVENOUS
  Filled 2016-01-06: qty 53

## 2016-01-06 MED ORDER — SODIUM CHLORIDE 0.9% FLUSH
10.0000 mL | INTRAVENOUS | Status: DC | PRN
  Administered 2016-01-06: 10 mL
  Filled 2016-01-06: qty 10

## 2016-01-06 MED ORDER — PROCHLORPERAZINE MALEATE 10 MG PO TABS: 10.0000 mg | ORAL_TABLET | Freq: Four times a day (QID) | ORAL | Status: DC | PRN

## 2016-01-06 MED ORDER — SODIUM CHLORIDE 0.9 % IV SOLN
Freq: Once | INTRAVENOUS | Status: AC
Start: 2016-01-06 — End: 2016-01-06
  Administered 2016-01-06: 13:00:00 via INTRAVENOUS

## 2016-01-06 MED ORDER — CYANOCOBALAMIN 1000 MCG/ML IJ SOLN
INTRAMUSCULAR | Status: AC
  Filled 2016-01-06: qty 1

## 2016-01-06 MED ORDER — LIDOCAINE-PRILOCAINE 2.5-2.5 % EX CREA: TOPICAL_CREAM | CUTANEOUS | Status: DC

## 2016-01-06 MED ORDER — SODIUM CHLORIDE 0.9 % IV SOLN
10.0000 mg | Freq: Once | INTRAVENOUS | Status: AC
  Administered 2016-01-06: 10 mg via INTRAVENOUS
  Filled 2016-01-06: qty 1

## 2016-01-06 NOTE — Patient Instructions (Addendum)
    [facility] Discharge Instructions for Patients Receiving Chemotherapy  Today you received the following chemotherapy agents Alimta, Carbo To help prevent nausea and vomiting after your treatment, we encourage you to take your nausea medication as prescribed  Start taking Folic Acid 1 mg.    EMLA Cream for Port.  Put 1 TBLSP directly on portacath site at least 1 hour prior to chemotherapy.  Cover with saran wrap or Press and Seal.    To help prevent nausea and vomiting after your treatment, we encourage you to take your nausea medication   1) Zofran 8 mg by mouth.  Take 1 tablet by mouth twice daily as needed for nausea beginning Thursday 01/09/16. 2) Compazine 10 mg by mouth every 6 hours as needed for nausea. 3) Ativan every 6 hours as needed for nausea, vomiting, difficulty sleeping and anxiety.   If you develop nausea and vomiting that is not controlled by your nausea medication, call the clinic. If it is after clinic hours your family physician or the after hours number for the clinic or go to the Emergency Department.   BELOW ARE SYMPTOMS THAT SHOULD BE REPORTED IMMEDIATELY:  *FEVER GREATER THAN 100.5 F  *CHILLS WITH OR WITHOUT FEVER  NAUSEA AND VOMITING THAT IS NOT CONTROLLED WITH YOUR NAUSEA MEDICATION  *UNUSUAL SHORTNESS OF BREATH  *UNUSUAL BRUISING OR BLEEDING  TENDERNESS IN MOUTH AND THROAT WITH OR WITHOUT PRESENCE OF ULCERS  *URINARY PROBLEMS  *BOWEL PROBLEMS  UNUSUAL RASH Items with * indicate a potential emergency and should be followed up as soon as possible.  One of the nurses will contact you 24 hours after your treatment. Please let the nurse know about any problems that you may have experienced. Feel free to call the clinic you have any questions or concerns. The clinic phone number is [phone].   I have been informed and understand all the instructions given to me. I know to contact the clinic, my physician, or go to the  Emergency Department if any problems should occur. I do not have any questions at this time, but understand that I may call the clinic during office hours   should I have any questions or need assistance in obtaining follow up care.    __________________________________________  _____________  __________ Signature of Patient or Authorized Representative            Date                   Time    __________________________________________ Nurse's Signature    mg    If you develop nausea and vomiting that is not controlled by your nausea medication, call the clinic.   BELOW ARE SYMPTOMS THAT SHOULD BE REPORTED IMMEDIATELY:  *FEVER GREATER THAN 100.5 F  *CHILLS WITH OR WITHOUT FEVER  NAUSEA AND VOMITING THAT IS NOT CONTROLLED WITH YOUR NAUSEA MEDICATION  *UNUSUAL SHORTNESS OF BREATH  *UNUSUAL BRUISING OR BLEEDING  TENDERNESS IN MOUTH AND THROAT WITH OR WITHOUT PRESENCE OF ULCERS  *URINARY PROBLEMS  *BOWEL PROBLEMS  UNUSUAL RASH Items with * indicate a potential emergency and should be followed up as soon as possible.  Feel free to call the clinic you have any questions or concerns. The clinic phone number is [phone].  Please show the [facility] at check-in to the Emergency Department and triage nurse.

## 2016-01-07 ENCOUNTER — Telehealth: Payer: Self-pay | Admitting: *Deleted

## 2016-01-07 NOTE — Telephone Encounter (Addendum)
Patient is doing well today. She has no complaints. She is currently symptom free. She has all prescribed prn medications in case of symptom development. She has no questions or concerns. She knows to call the office if she has any problems or questions.    ----- Message from Rico Ala, RN sent at 01/06/2016 12:13 PM EDT ----- Regarding: chemo follow up Please do chemo follow up call on 01/07/16.  Patient got Alimta and Carboplatin

## 2016-01-10 ENCOUNTER — Encounter: Payer: Self-pay | Admitting: Hematology & Oncology

## 2016-01-10 ENCOUNTER — Telehealth: Payer: Self-pay | Admitting: *Deleted

## 2016-01-10 ENCOUNTER — Other Ambulatory Visit: Payer: Self-pay | Admitting: *Deleted

## 2016-01-10 DIAGNOSIS — C3491 Malignant neoplasm of unspecified part of right bronchus or lung: Secondary | ICD-10-CM

## 2016-01-10 NOTE — Telephone Encounter (Signed)
Patient notifying office that she was discharged from the hospital with nebulizer medications, but no nebulizer machine. Order for nebulizer machine sent to [facility].

## 2016-01-21 ENCOUNTER — Other Ambulatory Visit: Payer: Self-pay | Admitting: *Deleted

## 2016-01-21 DIAGNOSIS — K5900 Constipation, unspecified: Secondary | ICD-10-CM

## 2016-01-21 DIAGNOSIS — C3492 Malignant neoplasm of unspecified part of left bronchus or lung: Secondary | ICD-10-CM

## 2016-01-21 MED ORDER — POLYETHYLENE GLYCOL 3350 17 GM/SCOOP PO POWD: 17.0000 g | Freq: Two times a day (BID) | ORAL | Status: DC

## 2016-01-22 ENCOUNTER — Encounter: Payer: Self-pay | Admitting: Emergency Medicine

## 2016-01-22 ENCOUNTER — Ambulatory Visit (INDEPENDENT_AMBULATORY_CARE_PROVIDER_SITE_OTHER): Payer: BLUE CROSS/BLUE SHIELD | Admitting: Emergency Medicine

## 2016-01-22 VITALS — BP 124/80 | HR 98 | Ht 64.0 in | Wt 182.0 lb

## 2016-01-22 DIAGNOSIS — J449 Chronic obstructive pulmonary disease, unspecified: Secondary | ICD-10-CM | POA: Diagnosis not present

## 2016-01-22 NOTE — Patient Instructions (Addendum)
Please discuss with Dr Katheran Awe if you can decrease your prednisone dosing. I believe that you may be able to tolerate this.  Please continue your Stiolto daily Take albuterol 2 puffs up to every 4 hours if needed for shortness of breath.  Continue your oxygen at 4 - 5 L/min Follow with Dr Lamonte Sakai in 3 months or sooner if you have any problems.

## 2016-01-22 NOTE — Progress Notes (Signed)
Subjective:    Patient ID: Carol Buchanan, female    DOB: 1961-05-21, 55 y.o.   MRN: [id number]  HPI 55 yo former smoker, hx of HTN, hyperlipidemia, allergies. Presents today for eval of progressive exertional dyspnea. She is s/p R sgy about 6 months ago and has been very inactive and believes she is deconditioned. She hears wheezing with exertion. She still has some cough, better since she quit smoking but still does cough some, non-productive. Her GERD is controlled on omeprazole. She was on spiriva once, not sure if it helped.   ROV 12/09/12 -- returns for f/u sob. Last time we ordered PFT, treated her PND. Didn't feel any real improvement with nasonex. She has used some albuterol >> feels that it helps her. No evidence exacerbation. Finished rehab.   ROV 01/20/13 -- f/u for moderate COPD. Last time we started Spiriva as a trial > she believes that it has helped her. Exertional tolerance is improved, does still wheeze sometimes after exertion. She uses albuterol in the am, rarely needs during the day. Her cough is much improved.   ROV 07/13/13 -- hx COPD, cough. Started Spiriva in May 2014. She is scheduled for back sgy w Dr Hal Neer end of Dec. She uses ProAir bid. Her cough is much better. She hasn't been as active because she hurt her foot and now she needs back sgy.   ROV 08/16/13 -- follows for hx COPD, cough. Last time did a trial adding Dulera to Spiriva > she has noticed significant improvement. She had sgy w Dr Hal Neer, went well except for an incisional infxn, on abx.   ROV 03/28/14 -- follow up for COPD, cough. She is on [place]. She has been having more exertional SOB. Continues to go to gym, has been limited a bit by her knee. She hears wheezing w activity. She hasn't  Tried using the SABA. She has nasal congestion, has been using [place].    ROV 11/15/14 -- follow-up visit for COPD and cough. At her last visit we stopped Spiriva and Dulera to see if she would benefit from  Anoro. We also started loratadine 10 mg daily. She believes that she is better on the Anoro. She isn';t using or needing ProAir frequently.  Her GERD is controlled.   Acute OV 06/25/15 -- patient with a history of former tobacco use and COPD that we have been treating with Anoro since April 2016. She reports that she was evaluated for left anterior chest wall pain in the emergency department which prompted a CT scan of the chest on 06/21/15 that I personally reviewed. This showed left upper lobe lingular collapse with a bronchial cutoff sign and enlargement of the left hilar lymph nodes. Also noted are enlarged nodes in the anterior mediastinum and left paratracheal and pretracheal regions. She was referred immediately to see Dr Katheran Awe given the high suspicion for possible primary lung cancer. She is referred to me now urgently to consider tissue diagnosis.   ROV 08/29/15 -- follow-up visit for history of COPD, tobacco use and new diagnosis of non-small cell lung cancer involving the left upper lobe. She underwent bronchoscopy on 07/01/15 that confirmed poorly differentiated adenocarcinoma. She's been seen by Dr Katheran Awe, has completed a course of XRT. She is scheduled to start immunotherapy soon once approved by insurance. Her L sided chest pain is better with XRT.  She feels that she may be more SOB since last time. The Anoro has started making her cough. She is having  nasal congestion with blood secretions, no fever. Her MRI 12/18 showed some paranasal thickening.   ROV 10/01/15 -- follow-up visit for history of non-small cell lung cancer in the left upper lobe, COPD, chronic cough and seasonal allergies. Last month we tried changing her Anoro to Stiolto to see if this formulation would be better with regard to her cough. She has been on immunotherapy under the direction of Dr Katheran Awe.   ROV 01/22/16 -- she follows up for history of COPD, chronic cough, frequent exacerbations. She also has a history of stage IV  non-small cell lung cancer in the left upper lobe. She has been on immunotherapy, last dose was given in May. She was hospitalized for pneumonitis. Then she was unfortunately hospitalized in early June for GI bleeding > hemorrhoids. She is now getting standard chemotherapy, has had first treatment.     PULMONARY FUNCTON TEST 12/09/2012  FVC 3.37  FEV1 2.65  FEV1/FVC 78.6  FVC  % Predicted 73  FEV % Predicted 71  FeF 25-75 3.27  FeF 25-75 % Predicted 2.46     Review of Systems  Constitutional: Negative for fever and unexpected weight change.  HENT: Negative for congestion, dental problem, ear pain, nosebleeds, postnasal drip, rhinorrhea, sinus pressure, sneezing, sore throat and trouble swallowing.   Eyes: Negative for redness and itching.  Respiratory: Positive for shortness of breath. Negative for cough, chest tightness and wheezing.   Cardiovascular: Negative for palpitations and leg swelling.  Gastrointestinal: Negative for nausea and vomiting.  Genitourinary: Negative for dysuria.  Musculoskeletal: Negative for joint swelling.  Skin: Negative for rash.  Neurological: Negative for headaches.  Hematological: Does not bruise/bleed easily.  Psychiatric/Behavioral: Negative for dysphoric mood. The patient is not nervous/anxious.         Objective:   Physical Exam Filed Vitals:   01/22/16 1504  BP: 124/80  Pulse: 98  Height: '5\' 4"'$  (1.626 m)  Weight: 182 lb (82.555 kg)  SpO2: 98%   Gen: Pleasant, well-nourished, in no distress,  normal affect  ENT: No lesions,  mouth clear,  oropharynx clear, no postnasal drip  Neck: No JVD, no TMG, no carotid bruits  Lungs: No use of accessory muscles, few exp wheezes on a forced expiration.   Cardiovascular: RRR, heart sounds normal, no murmur or gallops, no peripheral edema  Musculoskeletal: No deformities, no cyanosis or clubbing  Neuro: alert, non focal  Skin: Warm, no lesions or rashes    CT chest 06/21/15 --   COMPARISON: Chest radiograph 06/21/2015  FINDINGS: Collapse and consolidation of the left upper lung and left lingula. Bronchial cut off sign with low-attenuation in the left hilum and probable enlarged left hilar lymph node measuring about 2.2 cm diameter. Additional enlarged lymph nodes in the aortopulmonic window region, anterior mediastinum, left paratracheal, pretracheal, and subcarinal regions. Findings likely due to obstructing malignancy with metastatic mediastinal lymph nodes. Bronchoscopy may be useful in further evaluation if clinically indicated.  Atelectasis in the lung bases. No pleural effusions. No pneumothorax.  Normal heart size. Normal caliber thoracic aorta. Aberrant right subclavian artery. Esophagus is decompressed.  Included portions of the upper abdominal organs demonstrate diffuse fatty infiltration of the liver. No adrenal gland nodules are demonstrated although the left adrenal gland is not entirely included within the field of view. Degenerative changes in the spine. No destructive bone lesions appreciated.  IMPRESSION: Left upper lung collapse with bronchial cut off sign and enlarged left hilar and mediastinal lymph nodes consistent with obstructing malignancy with lymph node  metastasis.      Assessment & Plan:  COPD (chronic obstructive pulmonary disease) ([place]) ? Whether we can start to taper the prednisone. I believe this was continued in setting of her suspected pneumonitis on immunotherapy. Will ask her to discuss w Dr Katheran Awe. I would be in favor of decreasing.    Please continue your Stiolto daily Take albuterol 2 puffs up to every 4 hours if needed for shortness of breath.  Continue your oxygen at 4 - 5 L/min Follow with Dr Lamonte Sakai in 3 months or sooner if you have any problems.

## 2016-01-22 NOTE — Assessment & Plan Note (Signed)
?   Whether we can start to taper the prednisone. I believe this was continued in setting of her suspected pneumonitis on immunotherapy. Will ask her to discuss w Dr Katheran Awe. I would be in favor of decreasing.    Please continue your Stiolto daily Take albuterol 2 puffs up to every 4 hours if needed for shortness of breath.  Continue your oxygen at 4 - 5 L/min Follow with Dr Lamonte Sakai in 3 months or sooner if you have any problems.

## 2016-01-24 ENCOUNTER — Other Ambulatory Visit: Payer: Self-pay | Admitting: Nurse Practitioner

## 2016-01-24 DIAGNOSIS — C3491 Malignant neoplasm of unspecified part of right bronchus or lung: Secondary | ICD-10-CM

## 2016-01-27 ENCOUNTER — Ambulatory Visit (HOSPITAL_BASED_OUTPATIENT_CLINIC_OR_DEPARTMENT_OTHER): Payer: BLUE CROSS/BLUE SHIELD | Admitting: Hematology & Oncology

## 2016-01-27 ENCOUNTER — Other Ambulatory Visit (HOSPITAL_BASED_OUTPATIENT_CLINIC_OR_DEPARTMENT_OTHER): Payer: BLUE CROSS/BLUE SHIELD

## 2016-01-27 ENCOUNTER — Ambulatory Visit (HOSPITAL_BASED_OUTPATIENT_CLINIC_OR_DEPARTMENT_OTHER): Payer: BLUE CROSS/BLUE SHIELD

## 2016-01-27 ENCOUNTER — Encounter: Payer: Self-pay | Admitting: Hematology & Oncology

## 2016-01-27 VITALS — BP 135/75 | HR 102 | Temp 98.0°F | Resp 18 | Ht 64.0 in | Wt 182.1 lb

## 2016-01-27 DIAGNOSIS — C3492 Malignant neoplasm of unspecified part of left bronchus or lung: Secondary | ICD-10-CM | POA: Diagnosis not present

## 2016-01-27 DIAGNOSIS — C7951 Secondary malignant neoplasm of bone: Secondary | ICD-10-CM

## 2016-01-27 DIAGNOSIS — Z5111 Encounter for antineoplastic chemotherapy: Secondary | ICD-10-CM | POA: Diagnosis not present

## 2016-01-27 DIAGNOSIS — K922 Gastrointestinal hemorrhage, unspecified: Secondary | ICD-10-CM

## 2016-01-27 DIAGNOSIS — C3491 Malignant neoplasm of unspecified part of right bronchus or lung: Secondary | ICD-10-CM

## 2016-01-27 DIAGNOSIS — R601 Generalized edema: Secondary | ICD-10-CM

## 2016-01-27 LAB — CMP (CANCER CENTER ONLY)
ALBUMIN: 3.8 g/dL (ref 3.3–5.5)
ALT(SGPT): 44 U/L (ref 10–47)
AST: 28 U/L (ref 11–38)
Alkaline Phosphatase: 47 U/L (ref 26–84)
BILIRUBIN TOTAL: 0.6 mg/dL (ref 0.20–1.60)
BUN, Bld: 16 mg/dL (ref 7–22)
CALCIUM: 9.6 mg/dL (ref 8.0–10.3)
CHLORIDE: 99 meq/L (ref 98–108)
CO2: 30 meq/L (ref 18–33)
Creat: 0.9 mg/dl (ref 0.6–1.2)
GLUCOSE: 100 mg/dL (ref 73–118)
Potassium: 3.9 mEq/L (ref 3.3–4.7)
Sodium: 135 mEq/L (ref 128–145)
Total Protein: 6.8 g/dL (ref 6.4–8.1)

## 2016-01-27 LAB — CBC WITH DIFFERENTIAL (CANCER CENTER ONLY)
BASO#: 0.1 10*3/uL (ref 0.0–0.2)
BASO%: 0.5 % (ref 0.0–2.0)
EOS ABS: 0 10*3/uL (ref 0.0–0.5)
EOS%: 0.3 % (ref 0.0–7.0)
HEMATOCRIT: 30.8 % — AB (ref 34.8–46.6)
HGB: 10.1 g/dL — ABNORMAL LOW (ref 11.6–15.9)
LYMPH#: 0.7 10*3/uL — AB (ref 0.9–3.3)
LYMPH%: 6.6 % — ABNORMAL LOW (ref 14.0–48.0)
MCH: 35.3 pg — AB (ref 26.0–34.0)
MCHC: 32.8 g/dL (ref 32.0–36.0)
MCV: 108 fL — ABNORMAL HIGH (ref 81–101)
MONO#: 0.5 10*3/uL (ref 0.1–0.9)
MONO%: 5.2 % (ref 0.0–13.0)
NEUT#: 8.7 10*3/uL — ABNORMAL HIGH (ref 1.5–6.5)
NEUT%: 87.4 % — AB (ref 39.6–80.0)
Platelets: 283 10*3/uL (ref 145–400)
RBC: 2.86 10*6/uL — ABNORMAL LOW (ref 3.70–5.32)
RDW: 17.2 % — AB (ref 11.1–15.7)
WBC: 10 10*3/uL (ref 3.9–10.0)

## 2016-01-27 MED ORDER — SODIUM CHLORIDE 0.9 % IV SOLN
523.5000 mg | Freq: Once | INTRAVENOUS | Status: AC
  Administered 2016-01-27: 520 mg via INTRAVENOUS
  Filled 2016-01-27: qty 52

## 2016-01-27 MED ORDER — PALONOSETRON HCL INJECTION 0.25 MG/5ML
0.2500 mg | Freq: Once | INTRAVENOUS | Status: AC
  Administered 2016-01-27: 0.25 mg via INTRAVENOUS

## 2016-01-27 MED ORDER — SODIUM CHLORIDE 0.9 % IV SOLN
500.0000 mg/m2 | Freq: Once | INTRAVENOUS | Status: AC
  Administered 2016-01-27: 950 mg via INTRAVENOUS
  Filled 2016-01-27: qty 38

## 2016-01-27 MED ORDER — FUROSEMIDE 10 MG/ML IJ SOLN
20.0000 mg | Freq: Once | INTRAMUSCULAR | Status: AC
  Administered 2016-01-27: 20 mg via INTRAVENOUS

## 2016-01-27 MED ORDER — SODIUM CHLORIDE 0.9% FLUSH
10.0000 mL | INTRAVENOUS | Status: DC | PRN
  Administered 2016-01-27: 10 mL
  Filled 2016-01-27: qty 10

## 2016-01-27 MED ORDER — SODIUM CHLORIDE 0.9 % IV SOLN
Freq: Once | INTRAVENOUS | Status: AC
  Administered 2016-01-27: 09:00:00 via INTRAVENOUS

## 2016-01-27 MED ORDER — FUROSEMIDE 10 MG/ML IJ SOLN
INTRAMUSCULAR | Status: AC
  Filled 2016-01-27: qty 4

## 2016-01-27 MED ORDER — SODIUM CHLORIDE 0.9 % IV SOLN
10.0000 mg | Freq: Once | INTRAVENOUS | Status: AC
  Administered 2016-01-27: 10 mg via INTRAVENOUS
  Filled 2016-01-27: qty 1

## 2016-01-27 MED ORDER — HEPARIN SOD (PORK) LOCK FLUSH 100 UNIT/ML IV SOLN
500.0000 [IU] | Freq: Once | INTRAVENOUS | Status: AC | PRN
  Administered 2016-01-27: 500 [IU]
  Filled 2016-01-27: qty 5

## 2016-01-27 MED ORDER — PALONOSETRON HCL INJECTION 0.25 MG/5ML
INTRAVENOUS | Status: AC
  Filled 2016-01-27: qty 5

## 2016-01-27 MED ORDER — DENOSUMAB 120 MG/1.7ML ~~LOC~~ SOLN
120.0000 mg | Freq: Once | SUBCUTANEOUS | Status: AC
  Administered 2016-01-27: 120 mg via SUBCUTANEOUS
  Filled 2016-01-27: qty 1.7

## 2016-01-27 NOTE — Progress Notes (Signed)
Hematology and Oncology Follow Up Visit  Carol Buchanan [id number] Dec 12, 1960 55 y.o. 01/27/2016   Principle Diagnosis:  Metastatic poorly differentiated carcinoma of the left lung-(-) for    EGFR/ALK/ROS - -PD-L1 (+) Pneumonitis -probably secondary to [facility]  Current Therapy:   Patient status post radiation therapy Xgeva 120 mg subcutaneous monthly Pembrolizumab 241m IV q 3wks - s/p cycle 4 Carboplatin/Alimta-to start week of06/06/2016    Interim History:  Carol Buchanan here today for follow-up. She is looking a little better. Last time she was here, she had some GI bleeding. We cannot start her on chemotherapy until we've sorted out what was going on. She was hospitalized. She was basically hospitalized for one day. She underwent a colonoscopy and upper endoscopy. All that was found that she has some internal hemorrhoids.  There are no evidence of any ulcerations or gastritis from steroids.   She is eating a little bit better. The steroids are probably helping her with this. Try to taper her down. I wanted to go down to 10 mg a day now.  She is on chronic oxygen. I don't know she has had pneumonitis from past Keytruda or this is lymphangitic tumor spread.  She still is having a little bit of a cough. Cough is nonproductive. She's had no fever. She's had no skin rashes. She's had no nausea or vomiting.  She definitely looks cushingoid because the steroids.  Overall, her performance status is ECOG 1.   Medications:    Medication List       This list is accurate as of: 01/27/16  8:59 AM.  Always use your most recent med list.               calcium-vitamin D 500-200 MG-UNIT tablet  Take 1 tablet by mouth daily.     CENTRUM SILVER PO  Take 1 tablet by mouth daily.     chlorhexidine 0.12 % solution  Commonly known as:  PERIDEX  15 mLs by Mouth Rinse route 2 (two) times daily.     DULoxetine 60 MG capsule  Commonly known as:  CYMBALTA  Take 60 mg by mouth daily.  Reported on 08/06/2015     fluticasone 50 MCG/ACT nasal spray  Commonly known as:  FLONASE  Place 2 sprays into both nostrils daily.     folic acid 1 MG tablet  Commonly known as:  FOLVITE  Take 1 tablet (1 mg total) by mouth daily. Start 5-7 days before Alimta chemotherapy. Continue until 21 days after Alimta completed.     glipiZIDE 5 MG tablet  Commonly known as:  GLUCOTROL  Take 1 tablet (5 mg total) by mouth 2 (two) times daily before a meal.     glipiZIDE 5 MG tablet  Commonly known as:  GLUCOTROL  Take 1 tablet (5 mg total) by mouth 2 (two) times daily before a meal.     hydrocortisone 25 MG suppository  Commonly known as:  ANUSOL-HC  Place 1 suppository (25 mg total) rectally 2 (two) times daily.     ibuprofen 200 MG tablet  Commonly known as:  ADVIL,MOTRIN  Take 200-400 mg by mouth every 6 (six) hours as needed for headache or moderate pain.     ipratropium-albuterol 0.5-2.5 (3) MG/3ML Soln  Commonly known as:  DUONEB  Take 3 mLs by nebulization 3 (three) times daily.     lidocaine-prilocaine cream  Commonly known as:  EMLA  Apply to affected area once     LORazepam 0.5  MG tablet  Commonly known as:  ATIVAN  Take 1 tablet by mouth every 6 hours if needed for nausea or vomiting     meloxicam 15 MG tablet  Commonly known as:  MOBIC  Take 15 mg by mouth every other day.     montelukast 10 MG tablet  Commonly known as:  SINGULAIR  take 1 tablet by mouth at bedtime     omeprazole 20 MG capsule  Commonly known as:  PRILOSEC  Take 1 capsule (20 mg total) by mouth daily.     ondansetron 8 MG tablet  Commonly known as:  ZOFRAN  Take 8 mg by mouth 2 (two) times daily as needed for nausea or vomiting.     polyethylene glycol powder powder  Commonly known as:  MIRALAX  Take 17 g by mouth 2 (two) times daily.     predniSONE 20 MG tablet  Commonly known as:  DELTASONE  Take 2 tablets (40 mg total) by mouth daily with breakfast.     prochlorperazine 10 MG tablet   Commonly known as:  COMPAZINE  Take 1 tablet (10 mg total) by mouth every 6 (six) hours as needed (Nausea or vomiting).     simvastatin 20 MG tablet  Commonly known as:  ZOCOR  Take 20 mg by mouth daily.     Tiotropium Bromide-Olodaterol 2.5-2.5 MCG/ACT Aers  Commonly known as:  STIOLTO RESPIMAT  Inhale 2 puffs into the lungs daily.     verapamil 180 MG (CO) 24 hr tablet  Commonly known as:  COVERA HS  Take 180 mg by mouth daily.     vitamin C 1000 MG tablet  Take 1,000 mg by mouth daily.     vitamin E 400 UNIT capsule  Take 400 Units by mouth daily.        Allergies: No Known Allergies  Past Medical History, Surgical history, Social history, and Family History were reviewed and updated.  Review of Systems: All other 10 point review of systems is negative.   Physical Exam:  height is _0  (1.626 m) and weight is 182 lb 1.3 oz (82.591 kg). Her temperature is 98 F (36.7 C). Her blood pressure is 135/75 and her pulse is 102. Her respiration is 18 and oxygen saturation is 100%.   Wt Readings from Last 3 Encounters:  01/27/16 182 lb 1.3 oz (82.591 kg)  01/22/16 182 lb (82.555 kg)  12/26/15 175 lb (79.379 kg)    Cushingoid appearing white female in no obvious distress. Head and neck exam shows no ocular or oral lesions. She has no thrush. No adenopathy is noted in the neck. Lungs show decent breath sounds bilaterally.  She has occasional wheezes. Cardiac exam regular rate and rhythm with no murmurs, rubs or bruits. Abdomen is soft. She is slightly obese. She has good bowel sounds. She has no guarding or rebound tenderness. She has no palpable hepatosplenomegaly. Back exam shows no tenderness over the spine, ribs or hips. Extremities shows some trace edema in her legs. Skin exam shows no rashes, ecchymoses or petechia. Rectal exam shows no tears or fissures. She has no external hemorrhoids area and stool is brownish red and grossly heme positive. Neurological exam shows no focal  neurological deficits.   Lab Results  Component Value Date   WBC 10.0 01/27/2016   HGB 10.1* 01/27/2016   HCT 30.8* 01/27/2016   MCV 108* 01/27/2016   PLT 283 01/27/2016   No results found for: FERRITIN, IRON, TIBC, UIBC, IRONPCTSAT  Lab Results  Component Value Date   RBC 2.86* 01/27/2016   No results found for: KPAFRELGTCHN, LAMBDASER, KAPLAMBRATIO No results found for: IGGSERUM, IGA, IGMSERUM No results found for: Odetta Pink, SPEI   Chemistry      Component Value Date/Time   NA 137 01/06/2016 1127   NA 138 12/27/2015 1145   NA 135* 11/13/2015 1131   K 4.2 01/06/2016 1127   K 4.0 12/27/2015 1145   K 4.4 11/13/2015 1131   CL 99 01/06/2016 1127   CL 106 12/27/2015 1145   CO2 30 01/06/2016 1127   CO2 23 12/27/2015 1145   CO2 21* 11/13/2015 1131   BUN 16 01/06/2016 1127   BUN 14 12/27/2015 1145   BUN 18.7 11/13/2015 1131   CREATININE 1.0 01/06/2016 1127   CREATININE 0.75 12/27/2015 1145   CREATININE 0.8 11/13/2015 1131      Component Value Date/Time   CALCIUM 9.2 01/06/2016 1127   CALCIUM 8.8* 12/27/2015 1145   CALCIUM 8.9 11/13/2015 1131   ALKPHOS 47 01/06/2016 1127   ALKPHOS 42 12/27/2015 1145   ALKPHOS 66 11/13/2015 1131   AST 22 01/06/2016 1127   AST 22 12/27/2015 1145   AST 22 11/13/2015 1131   ALT 29 01/06/2016 1127   ALT 34 12/27/2015 1145   ALT 27 11/13/2015 1131   BILITOT 0.80 01/06/2016 1127   BILITOT 0.7 12/27/2015 1145   BILITOT 0.39 11/13/2015 1131     Impression and Plan: Carol Buchanan is a 55 yo white female with metastatic poorly differentiated carcinoma of the left lung and wild-type for the typical genetic mutations.   I think that we can go ahead and get her started on therapy today. I think that she will be okay for this.  Had gone over treatment with her in the past. She understands all the toxicities.  I want to try 3 cycles of treatment and then repeat her scans and see how  things look.   We will plan to have her come back in 3 weeks for his next cycle of treatment. She come back sooner if she has any issues.   I spent about 25 minutes with she and her mom. As always, it is fine to talk with them.    Volanda Napoleon, MD 7/3/20178:59 AM

## 2016-01-27 NOTE — Patient Instructions (Signed)
    [facility] Discharge Instructions for Patients Receiving Chemotherapy  Today you received the following chemotherapy agents Alimta, Carbo To help prevent nausea and vomiting after your treatment, we encourage you to take your nausea medication as prescribed  Start taking Folic Acid 1 mg.    EMLA Cream for Port.  Put 1 TBLSP directly on portacath site at least 1 hour prior to chemotherapy.  Cover with saran wrap or Press and Seal.    To help prevent nausea and vomiting after your treatment, we encourage you to take your nausea medication   1) Zofran 8 mg by mouth.  Take 1 tablet by mouth twice daily as needed for nausea beginning Thursday 01/09/16. 2) Compazine 10 mg by mouth every 6 hours as needed for nausea. 3) Ativan every 6 hours as needed for nausea, vomiting, difficulty sleeping and anxiety.   If you develop nausea and vomiting that is not controlled by your nausea medication, call the clinic. If it is after clinic hours your family physician or the after hours number for the clinic or go to the Emergency Department.   BELOW ARE SYMPTOMS THAT SHOULD BE REPORTED IMMEDIATELY:  *FEVER GREATER THAN 100.5 F  *CHILLS WITH OR WITHOUT FEVER  NAUSEA AND VOMITING THAT IS NOT CONTROLLED WITH YOUR NAUSEA MEDICATION  *UNUSUAL SHORTNESS OF BREATH  *UNUSUAL BRUISING OR BLEEDING  TENDERNESS IN MOUTH AND THROAT WITH OR WITHOUT PRESENCE OF ULCERS  *URINARY PROBLEMS  *BOWEL PROBLEMS  UNUSUAL RASH Items with * indicate a potential emergency and should be followed up as soon as possible.  One of the nurses will contact you 24 hours after your treatment. Please let the nurse know about any problems that you may have experienced. Feel free to call the clinic you have any questions or concerns. The clinic phone number is [phone].   I have been informed and understand all the instructions given to me. I know to contact the clinic, my physician, or go to the  Emergency Department if any problems should occur. I do not have any questions at this time, but understand that I may call the clinic during office hours   should I have any questions or need assistance in obtaining follow up care.    __________________________________________  _____________  __________ Signature of Patient or Authorized Representative            Date                   Time    __________________________________________ Nurse's Signature    mg    If you develop nausea and vomiting that is not controlled by your nausea medication, call the clinic.   BELOW ARE SYMPTOMS THAT SHOULD BE REPORTED IMMEDIATELY:  *FEVER GREATER THAN 100.5 F  *CHILLS WITH OR WITHOUT FEVER  NAUSEA AND VOMITING THAT IS NOT CONTROLLED WITH YOUR NAUSEA MEDICATION  *UNUSUAL SHORTNESS OF BREATH  *UNUSUAL BRUISING OR BLEEDING  TENDERNESS IN MOUTH AND THROAT WITH OR WITHOUT PRESENCE OF ULCERS  *URINARY PROBLEMS  *BOWEL PROBLEMS  UNUSUAL RASH Items with * indicate a potential emergency and should be followed up as soon as possible.  Feel free to call the clinic you have any questions or concerns. The clinic phone number is [phone].  Please show the [facility] at check-in to the Emergency Department and triage nurse.

## 2016-01-30 ENCOUNTER — Other Ambulatory Visit: Payer: Self-pay | Admitting: *Deleted

## 2016-01-30 MED ORDER — FLUTICASONE PROPIONATE 50 MCG/ACT NA SUSP: 2.0000 | Freq: Every day | NASAL | Status: AC

## 2016-02-10 ENCOUNTER — Other Ambulatory Visit: Payer: Self-pay | Admitting: Nurse Practitioner

## 2016-02-10 DIAGNOSIS — R0600 Dyspnea, unspecified: Secondary | ICD-10-CM

## 2016-02-10 DIAGNOSIS — C3492 Malignant neoplasm of unspecified part of left bronchus or lung: Secondary | ICD-10-CM

## 2016-02-10 MED ORDER — LORAZEPAM 0.5 MG PO TABS: ORAL_TABLET | ORAL | Status: DC

## 2016-02-17 ENCOUNTER — Other Ambulatory Visit: Payer: Self-pay | Admitting: Family

## 2016-02-17 ENCOUNTER — Other Ambulatory Visit (HOSPITAL_BASED_OUTPATIENT_CLINIC_OR_DEPARTMENT_OTHER): Payer: BLUE CROSS/BLUE SHIELD

## 2016-02-17 ENCOUNTER — Ambulatory Visit (HOSPITAL_BASED_OUTPATIENT_CLINIC_OR_DEPARTMENT_OTHER): Payer: BLUE CROSS/BLUE SHIELD

## 2016-02-17 VITALS — BP 119/65 | HR 93 | Temp 97.9°F | Resp 20

## 2016-02-17 DIAGNOSIS — K922 Gastrointestinal hemorrhage, unspecified: Secondary | ICD-10-CM

## 2016-02-17 DIAGNOSIS — C7951 Secondary malignant neoplasm of bone: Secondary | ICD-10-CM

## 2016-02-17 DIAGNOSIS — C3492 Malignant neoplasm of unspecified part of left bronchus or lung: Secondary | ICD-10-CM | POA: Diagnosis not present

## 2016-02-17 DIAGNOSIS — C3491 Malignant neoplasm of unspecified part of right bronchus or lung: Secondary | ICD-10-CM

## 2016-02-17 DIAGNOSIS — E119 Type 2 diabetes mellitus without complications: Secondary | ICD-10-CM | POA: Diagnosis not present

## 2016-02-17 DIAGNOSIS — R601 Generalized edema: Secondary | ICD-10-CM

## 2016-02-17 DIAGNOSIS — Z5111 Encounter for antineoplastic chemotherapy: Secondary | ICD-10-CM

## 2016-02-17 LAB — CBC WITH DIFFERENTIAL (CANCER CENTER ONLY)
BASO#: 0.1 10*3/uL (ref 0.0–0.2)
BASO%: 0.9 % (ref 0.0–2.0)
EOS%: 0.8 % (ref 0.0–7.0)
Eosinophils Absolute: 0.1 10*3/uL (ref 0.0–0.5)
HCT: 30.3 % — ABNORMAL LOW (ref 34.8–46.6)
HGB: 10 g/dL — ABNORMAL LOW (ref 11.6–15.9)
LYMPH#: 0.5 10*3/uL — ABNORMAL LOW (ref 0.9–3.3)
LYMPH%: 7.6 % — AB (ref 14.0–48.0)
MCH: 35.8 pg — ABNORMAL HIGH (ref 26.0–34.0)
MCHC: 33 g/dL (ref 32.0–36.0)
MCV: 109 fL — ABNORMAL HIGH (ref 81–101)
MONO#: 0.6 10*3/uL (ref 0.1–0.9)
MONO%: 9 % (ref 0.0–13.0)
NEUT%: 81.7 % — ABNORMAL HIGH (ref 39.6–80.0)
NEUTROS ABS: 5.3 10*3/uL (ref 1.5–6.5)
PLATELETS: 256 10*3/uL (ref 145–400)
RBC: 2.79 10*6/uL — AB (ref 3.70–5.32)
RDW: 16.5 % — ABNORMAL HIGH (ref 11.1–15.7)
WBC: 6.5 10*3/uL (ref 3.9–10.0)

## 2016-02-17 LAB — IRON AND TIBC
%SAT: 26 % (ref 21–57)
IRON: 95 ug/dL (ref 41–142)
TIBC: 359 ug/dL (ref 236–444)
UIBC: 264 ug/dL (ref 120–384)

## 2016-02-17 LAB — CMP (CANCER CENTER ONLY)
ALBUMIN: 3.9 g/dL (ref 3.3–5.5)
ALT(SGPT): 43 U/L (ref 10–47)
AST: 37 U/L (ref 11–38)
Alkaline Phosphatase: 42 U/L (ref 26–84)
BILIRUBIN TOTAL: 0.7 mg/dL (ref 0.20–1.60)
BUN: 16 mg/dL (ref 7–22)
CHLORIDE: 102 meq/L (ref 98–108)
CO2: 30 meq/L (ref 18–33)
CREATININE: 0.8 mg/dL (ref 0.6–1.2)
Calcium: 9.2 mg/dL (ref 8.0–10.3)
Glucose, Bld: 129 mg/dL — ABNORMAL HIGH (ref 73–118)
Potassium: 4.1 mEq/L (ref 3.3–4.7)
SODIUM: 138 meq/L (ref 128–145)
TOTAL PROTEIN: 6.7 g/dL (ref 6.4–8.1)

## 2016-02-17 LAB — TECHNOLOGIST REVIEW CHCC SATELLITE: Tech Review: 1

## 2016-02-17 LAB — FERRITIN: Ferritin: 48 ng/ml (ref 9–269)

## 2016-02-17 MED ORDER — SODIUM CHLORIDE 0.9 % IV SOLN
Freq: Once | INTRAVENOUS | Status: AC
  Administered 2016-02-17: 11:00:00 via INTRAVENOUS

## 2016-02-17 MED ORDER — SODIUM CHLORIDE 0.9 % IV SOLN
520.0000 mg | Freq: Once | INTRAVENOUS | Status: AC
  Administered 2016-02-17: 520 mg via INTRAVENOUS
  Filled 2016-02-17: qty 52

## 2016-02-17 MED ORDER — HEPARIN SOD (PORK) LOCK FLUSH 100 UNIT/ML IV SOLN
500.0000 [IU] | Freq: Once | INTRAVENOUS | Status: AC | PRN
  Administered 2016-02-17: 500 [IU]
  Filled 2016-02-17: qty 5

## 2016-02-17 MED ORDER — SODIUM CHLORIDE 0.9 % IV SOLN
500.0000 mg/m2 | Freq: Once | INTRAVENOUS | Status: AC
  Administered 2016-02-17: 950 mg via INTRAVENOUS
  Filled 2016-02-17: qty 38

## 2016-02-17 MED ORDER — SODIUM CHLORIDE 0.9 % IV SOLN
10.0000 mg | Freq: Once | INTRAVENOUS | Status: AC
  Administered 2016-02-17: 10 mg via INTRAVENOUS
  Filled 2016-02-17: qty 1

## 2016-02-17 MED ORDER — CYANOCOBALAMIN 1000 MCG/ML IJ SOLN
1000.0000 ug | Freq: Once | INTRAMUSCULAR | Status: AC
  Administered 2016-02-17: 1000 ug via INTRAMUSCULAR

## 2016-02-17 MED ORDER — PALONOSETRON HCL INJECTION 0.25 MG/5ML
0.2500 mg | Freq: Once | INTRAVENOUS | Status: AC
  Administered 2016-02-17: 0.25 mg via INTRAVENOUS

## 2016-02-17 MED ORDER — SODIUM CHLORIDE 0.9% FLUSH
10.0000 mL | INTRAVENOUS | Status: DC | PRN
  Administered 2016-02-17: 10 mL
  Filled 2016-02-17: qty 10

## 2016-02-17 NOTE — Patient Instructions (Signed)
    [facility] Discharge Instructions for Patients Receiving Chemotherapy  Today you received the following chemotherapy agents Alimta, Carbo To help prevent nausea and vomiting after your treatment, we encourage you to take your nausea medication as prescribed  Start taking Folic Acid 1 mg.    EMLA Cream for Port.  Put 1 TBLSP directly on portacath site at least 1 hour prior to chemotherapy.  Cover with saran wrap or Press and Seal.    To help prevent nausea and vomiting after your treatment, we encourage you to take your nausea medication   1) Zofran 8 mg by mouth.  Take 1 tablet by mouth twice daily as needed for nausea beginning Thursday 01/09/16. 2) Compazine 10 mg by mouth every 6 hours as needed for nausea. 3) Ativan every 6 hours as needed for nausea, vomiting, difficulty sleeping and anxiety.   If you develop nausea and vomiting that is not controlled by your nausea medication, call the clinic. If it is after clinic hours your family physician or the after hours number for the clinic or go to the Emergency Department.   BELOW ARE SYMPTOMS THAT SHOULD BE REPORTED IMMEDIATELY:  *FEVER GREATER THAN 100.5 F  *CHILLS WITH OR WITHOUT FEVER  NAUSEA AND VOMITING THAT IS NOT CONTROLLED WITH YOUR NAUSEA MEDICATION  *UNUSUAL SHORTNESS OF BREATH  *UNUSUAL BRUISING OR BLEEDING  TENDERNESS IN MOUTH AND THROAT WITH OR WITHOUT PRESENCE OF ULCERS  *URINARY PROBLEMS  *BOWEL PROBLEMS  UNUSUAL RASH Items with * indicate a potential emergency and should be followed up as soon as possible.  One of the nurses will contact you 24 hours after your treatment. Please let the nurse know about any problems that you may have experienced. Feel free to call the clinic you have any questions or concerns. The clinic phone number is [phone].   I have been informed and understand all the instructions given to me. I know to contact the clinic, my physician, or go to the  Emergency Department if any problems should occur. I do not have any questions at this time, but understand that I may call the clinic during office hours   should I have any questions or need assistance in obtaining follow up care.    __________________________________________  _____________  __________ Signature of Patient or Authorized Representative            Date                   Time    __________________________________________ Nurse's Signature    mg    If you develop nausea and vomiting that is not controlled by your nausea medication, call the clinic.   BELOW ARE SYMPTOMS THAT SHOULD BE REPORTED IMMEDIATELY:  *FEVER GREATER THAN 100.5 F  *CHILLS WITH OR WITHOUT FEVER  NAUSEA AND VOMITING THAT IS NOT CONTROLLED WITH YOUR NAUSEA MEDICATION  *UNUSUAL SHORTNESS OF BREATH  *UNUSUAL BRUISING OR BLEEDING  TENDERNESS IN MOUTH AND THROAT WITH OR WITHOUT PRESENCE OF ULCERS  *URINARY PROBLEMS  *BOWEL PROBLEMS  UNUSUAL RASH Items with * indicate a potential emergency and should be followed up as soon as possible.  Feel free to call the clinic you have any questions or concerns. The clinic phone number is [phone].  Please show the [facility] at check-in to the Emergency Department and triage nurse.

## 2016-02-18 LAB — RETICULOCYTES: Reticulocyte Count: 4.3 % — ABNORMAL HIGH (ref 0.6–2.6)

## 2016-02-26 ENCOUNTER — Ambulatory Visit (HOSPITAL_COMMUNITY)
Admission: RE | Admit: 2016-02-26 | Discharge: 2016-02-26 | Disposition: A | Discharge status: Home / Self Care | Payer: BLUE CROSS/BLUE SHIELD | Source: Ambulatory Visit | Attending: Family | Admitting: Family

## 2016-02-26 DIAGNOSIS — Y842 Radiological procedure and radiotherapy as the cause of abnormal reaction of the patient, or of later complication, without mention of misadventure at the time of the procedure: Secondary | ICD-10-CM | POA: Diagnosis not present

## 2016-02-26 DIAGNOSIS — C7951 Secondary malignant neoplasm of bone: Secondary | ICD-10-CM | POA: Insufficient documentation

## 2016-02-26 DIAGNOSIS — C3491 Malignant neoplasm of unspecified part of right bronchus or lung: Secondary | ICD-10-CM | POA: Diagnosis present

## 2016-02-26 LAB — GLUCOSE, CAPILLARY: Glucose-Capillary: 79 mg/dL (ref 65–99)

## 2016-02-26 MED ORDER — FLUDEOXYGLUCOSE F - 18 (FDG) INJECTION
9.1500 | Freq: Once | INTRAVENOUS | Status: AC | PRN
  Administered 2016-02-26: 9.15 via INTRAVENOUS

## 2016-02-28 ENCOUNTER — Telehealth: Payer: Self-pay | Admitting: Family

## 2016-02-28 NOTE — Telephone Encounter (Signed)
Called to speak with Carol Buchanan to go over Pet scan results. She was asleep so results were given to her mother who will then relay them to patient. All of mother's questions were answered. Told her to have patient call with any additional questions she may have.

## 2016-03-01 ENCOUNTER — Other Ambulatory Visit: Payer: Self-pay | Admitting: Family

## 2016-03-06 ENCOUNTER — Other Ambulatory Visit: Payer: Self-pay | Admitting: Nurse Practitioner

## 2016-03-06 DIAGNOSIS — C3491 Malignant neoplasm of unspecified part of right bronchus or lung: Secondary | ICD-10-CM

## 2016-03-09 ENCOUNTER — Ambulatory Visit (HOSPITAL_BASED_OUTPATIENT_CLINIC_OR_DEPARTMENT_OTHER): Payer: BLUE CROSS/BLUE SHIELD

## 2016-03-09 ENCOUNTER — Other Ambulatory Visit (HOSPITAL_BASED_OUTPATIENT_CLINIC_OR_DEPARTMENT_OTHER): Payer: BLUE CROSS/BLUE SHIELD

## 2016-03-09 ENCOUNTER — Encounter: Payer: Self-pay | Admitting: Hematology & Oncology

## 2016-03-09 ENCOUNTER — Other Ambulatory Visit: Payer: Self-pay | Admitting: Family

## 2016-03-09 ENCOUNTER — Ambulatory Visit (HOSPITAL_BASED_OUTPATIENT_CLINIC_OR_DEPARTMENT_OTHER): Payer: BLUE CROSS/BLUE SHIELD | Admitting: Hematology & Oncology

## 2016-03-09 VITALS — BP 151/85 | HR 104 | Temp 97.4°F | Resp 20 | Ht 64.0 in | Wt 188.0 lb

## 2016-03-09 DIAGNOSIS — C3492 Malignant neoplasm of unspecified part of left bronchus or lung: Secondary | ICD-10-CM

## 2016-03-09 DIAGNOSIS — C3491 Malignant neoplasm of unspecified part of right bronchus or lung: Secondary | ICD-10-CM

## 2016-03-09 DIAGNOSIS — C7951 Secondary malignant neoplasm of bone: Secondary | ICD-10-CM | POA: Diagnosis not present

## 2016-03-09 DIAGNOSIS — Z5111 Encounter for antineoplastic chemotherapy: Secondary | ICD-10-CM

## 2016-03-09 LAB — CMP (CANCER CENTER ONLY)
ALBUMIN: 3.7 g/dL (ref 3.3–5.5)
ALT: 40 U/L (ref 10–47)
AST: 37 U/L (ref 11–38)
Alkaline Phosphatase: 52 U/L (ref 26–84)
BUN: 12 mg/dL (ref 7–22)
CALCIUM: 9.1 mg/dL (ref 8.0–10.3)
CHLORIDE: 104 meq/L (ref 98–108)
CO2: 29 mEq/L (ref 18–33)
Creat: 1.1 mg/dl (ref 0.6–1.2)
Glucose, Bld: 130 mg/dL — ABNORMAL HIGH (ref 73–118)
POTASSIUM: 3.9 meq/L (ref 3.3–4.7)
Sodium: 131 mEq/L (ref 128–145)
TOTAL PROTEIN: 6.7 g/dL (ref 6.4–8.1)
Total Bilirubin: 0.7 mg/dl (ref 0.20–1.60)

## 2016-03-09 LAB — CBC WITH DIFFERENTIAL (CANCER CENTER ONLY)
BASO#: 0 10*3/uL (ref 0.0–0.2)
BASO%: 0.5 % (ref 0.0–2.0)
EOS%: 1 % (ref 0.0–7.0)
Eosinophils Absolute: 0.1 10*3/uL (ref 0.0–0.5)
HCT: 30.3 % — ABNORMAL LOW (ref 34.8–46.6)
HGB: 10.2 g/dL — ABNORMAL LOW (ref 11.6–15.9)
LYMPH#: 0.5 10*3/uL — ABNORMAL LOW (ref 0.9–3.3)
LYMPH%: 7.8 % — AB (ref 14.0–48.0)
MCH: 36 pg — ABNORMAL HIGH (ref 26.0–34.0)
MCHC: 33.7 g/dL (ref 32.0–36.0)
MCV: 107 fL — AB (ref 81–101)
MONO#: 0.5 10*3/uL (ref 0.1–0.9)
MONO%: 8.3 % (ref 0.0–13.0)
NEUT#: 4.8 10*3/uL (ref 1.5–6.5)
NEUT%: 82.4 % — AB (ref 39.6–80.0)
PLATELETS: 237 10*3/uL (ref 145–400)
RBC: 2.83 10*6/uL — ABNORMAL LOW (ref 3.70–5.32)
RDW: 15 % (ref 11.1–15.7)
WBC: 5.9 10*3/uL (ref 3.9–10.0)

## 2016-03-09 MED ORDER — SODIUM CHLORIDE 0.9 % IV SOLN
500.0000 mg/m2 | Freq: Once | INTRAVENOUS | Status: AC
  Administered 2016-03-09: 950 mg via INTRAVENOUS
  Filled 2016-03-09: qty 38

## 2016-03-09 MED ORDER — SODIUM CHLORIDE 0.9 % IV SOLN
Freq: Once | INTRAVENOUS | Status: AC
  Administered 2016-03-09: 11:00:00 via INTRAVENOUS

## 2016-03-09 MED ORDER — HEPARIN SOD (PORK) LOCK FLUSH 100 UNIT/ML IV SOLN
500.0000 [IU] | Freq: Once | INTRAVENOUS | Status: AC | PRN
  Administered 2016-03-09: 500 [IU]
  Filled 2016-03-09: qty 5

## 2016-03-09 MED ORDER — DENOSUMAB 120 MG/1.7ML ~~LOC~~ SOLN
120.0000 mg | Freq: Once | SUBCUTANEOUS | Status: AC
  Administered 2016-03-09: 120 mg via SUBCUTANEOUS
  Filled 2016-03-09: qty 1.7

## 2016-03-09 MED ORDER — SODIUM CHLORIDE 0.9 % IV SOLN
10.0000 mg | Freq: Once | INTRAVENOUS | Status: AC
  Administered 2016-03-09: 10 mg via INTRAVENOUS
  Filled 2016-03-09: qty 1

## 2016-03-09 MED ORDER — PALONOSETRON HCL INJECTION 0.25 MG/5ML
INTRAVENOUS | Status: AC
  Filled 2016-03-09: qty 5

## 2016-03-09 MED ORDER — PALONOSETRON HCL INJECTION 0.25 MG/5ML
0.2500 mg | Freq: Once | INTRAVENOUS | Status: AC
  Administered 2016-03-09: 0.25 mg via INTRAVENOUS

## 2016-03-09 MED ORDER — SODIUM CHLORIDE 0.9 % IV SOLN
487.0000 mg | Freq: Once | INTRAVENOUS | Status: AC
  Administered 2016-03-09: 490 mg via INTRAVENOUS
  Filled 2016-03-09: qty 49

## 2016-03-09 MED ORDER — SODIUM CHLORIDE 0.9% FLUSH
10.0000 mL | INTRAVENOUS | Status: DC | PRN
  Administered 2016-03-09: 10 mL
  Filled 2016-03-09: qty 10

## 2016-03-09 NOTE — Patient Instructions (Signed)
    [place] Discharge Instructions for Patients Receiving Chemotherapy  Today you received the following chemotherapy agents Alimta, Carbo  To help prevent nausea and vomiting after your treatment, we encourage you to take your nausea medication as prescribed  Start taking Folic Acid 1 mg.    EMLA Cream for Port.  Put 1 TBLSP directly on portacath site at least 1 hour prior to chemotherapy.  Cover with saran wrap or Press and Seal.    To help prevent nausea and vomiting after your treatment, we encourage you to take your nausea medication   1) Zofran 8 mg by mouth.  Take 1 tablet by mouth twice daily as needed for nausea beginning Thursday 01/09/16. 2) Compazine 10 mg by mouth every 6 hours as needed for nausea. 3) Ativan every 6 hours as needed for nausea, vomiting, difficulty sleeping and anxiety.   If you develop nausea and vomiting that is not controlled by your nausea medication, call the clinic. If it is after clinic hours your family physician or the after hours number for the clinic or go to the Emergency Department.   BELOW ARE SYMPTOMS THAT SHOULD BE REPORTED IMMEDIATELY:  *FEVER GREATER THAN 100.5 F  *CHILLS WITH OR WITHOUT FEVER  NAUSEA AND VOMITING THAT IS NOT CONTROLLED WITH YOUR NAUSEA MEDICATION  *UNUSUAL SHORTNESS OF BREATH  *UNUSUAL BRUISING OR BLEEDING  TENDERNESS IN MOUTH AND THROAT WITH OR WITHOUT PRESENCE OF ULCERS  *URINARY PROBLEMS  *BOWEL PROBLEMS  UNUSUAL RASH Items with * indicate a potential emergency and should be followed up as soon as possible.  One of the nurses will contact you 24 hours after your treatment. Please let the nurse know about any problems that you may have experienced. Feel free to call the clinic you have any questions or concerns. The clinic phone number is [phone].   I have been informed and understand all the instructions given to me. I know to contact the clinic, my physician, or go to the  Emergency Department if any problems should occur. I do not have any questions at this time, but understand that I may call the clinic during office hours   should I have any questions or need assistance in obtaining follow up care.    __________________________________________  _____________  __________ Signature of Patient or Authorized Representative            Date                   Time    __________________________________________ Nurse's Signature    mg    If you develop nausea and vomiting that is not controlled by your nausea medication, call the clinic.   BELOW ARE SYMPTOMS THAT SHOULD BE REPORTED IMMEDIATELY:  *FEVER GREATER THAN 100.5 F  *CHILLS WITH OR WITHOUT FEVER  NAUSEA AND VOMITING THAT IS NOT CONTROLLED WITH YOUR NAUSEA MEDICATION  *UNUSUAL SHORTNESS OF BREATH  *UNUSUAL BRUISING OR BLEEDING  TENDERNESS IN MOUTH AND THROAT WITH OR WITHOUT PRESENCE OF ULCERS  *URINARY PROBLEMS  *BOWEL PROBLEMS  UNUSUAL RASH Items with * indicate a potential emergency and should be followed up as soon as possible.  Feel free to call the clinic you have any questions or concerns. The clinic phone number is [phone].  Please show the [place] at check-in to the Emergency Department and triage nurse.

## 2016-03-09 NOTE — Progress Notes (Signed)
Hematology and Oncology Follow Up Visit  Carol Buchanan [id number] 23-Oct-1960 55 y.o. 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 She is looking a little better. She did go ahead and have a PET scan done last week or so. Thank you, the PET scan did not show any obvious recurrent or metastatic disease. There is no adenopathy in the mediastinum or hilum. She had some radiation type changes in her lungs.  There's been no bleeding. She's had no problems with bowels or bladder.  She sells wearing oxygen. She wears 5 L. I just wish that she would not have to wear as much.  She is not complaining of any pain. She's had no rashes. She's had no diarrhea or constipation. She may have an occasional bout of diarrhea but she is happy to have this instead of constipation.  She is on low-dose prednisone. She is on 5 mg a day. I told her to try to stop the prednisone to see how she feels.. She is eating quite well..  Overall, her performance status is ECOG 1.   Medications:    Medication List       Accurate as of 03/09/16 10:28 AM. Always use your most recent med list.          calcium-vitamin D 500-200 MG-UNIT tablet Take 1 tablet by mouth daily.   CENTRUM SILVER PO Take 1 tablet by mouth daily.   chlorhexidine 0.12 % solution Commonly known as:  PERIDEX 15 mLs by Mouth Rinse route 2 (two) times daily.   DULoxetine 60 MG capsule Commonly known as:  CYMBALTA Take 60 mg by mouth daily. Reported on 08/06/2015   fluticasone 50 MCG/ACT nasal spray Commonly known as:  FLONASE Place 2 sprays into both nostrils daily.   folic acid 1 MG tablet Commonly known as:  FOLVITE Take 1 tablet (1 mg total) by mouth daily. Start 5-7 days before Alimta chemotherapy. Continue until 21 days after Alimta completed.   glipiZIDE 5 MG tablet Commonly known as:  GLUCOTROL Take 1 tablet (5 mg total) by mouth 2 (two) times daily before a meal.   ibuprofen 200 MG tablet Commonly known as:  ADVIL,MOTRIN Take 200-400 mg by mouth every 6 (six) hours as needed for headache or moderate pain.   ipratropium-albuterol 0.5-2.5 (3) MG/3ML Soln Commonly known as:  DUONEB Take 3 mLs by nebulization 3 (three) times daily.   lidocaine-prilocaine cream Commonly known as:  EMLA Apply to affected area once   LORazepam 0.5 MG tablet Commonly known as:  ATIVAN Take 1 tablet by mouth every 6 hours if needed for nausea or vomiting   meloxicam 15 MG tablet Commonly known as:  MOBIC Take 15 mg by mouth every other day.   montelukast 10 MG tablet Commonly known as:  SINGULAIR take 1 tablet by mouth at bedtime   omeprazole 20 MG capsule Commonly known as:  PRILOSEC Take 1 capsule (20 mg total) by mouth daily.   ondansetron 8 MG tablet Commonly known as:  ZOFRAN Take 8 mg by mouth 2 (two) times daily as needed for nausea or vomiting.   predniSONE 20 MG tablet  Commonly known as:  DELTASONE Take 2 tablets (40 mg total) by mouth daily with breakfast.   prochlorperazine 10 MG tablet Commonly known as:  COMPAZINE Take 1 tablet (10 mg total) by mouth every 6 (six) hours as needed (Nausea or vomiting).   simvastatin 20 MG tablet Commonly known as:  ZOCOR Take 20 mg by mouth daily.   Tiotropium Bromide-Olodaterol 2.5-2.5 MCG/ACT Aers Commonly known as:  STIOLTO RESPIMAT Inhale 2 puffs into the lungs daily.   verapamil 180 MG (CO) 24 hr tablet Commonly known as:  COVERA HS Take 180 mg by mouth daily.   vitamin C 1000 MG tablet Take 1,000 mg by mouth daily.   vitamin E 400 UNIT capsule Take 400 Units by mouth daily.        Allergies: No Known Allergies  Past Medical History, Surgical history, Social history, and Family History were reviewed and updated.  Review of Systems: All other 10 point review of systems is negative.   Physical Exam:  height is '5\' 4"'$  (1.626 m) and weight is 188 lb (85.3 kg). Her oral temperature is 97.4 F (36.3 C). Her blood pressure is 151/85 (abnormal) and her pulse is 104 (abnormal). Her respiration is 20.   Wt Readings from Last 3 Encounters:  03/09/16 188 lb (85.3 kg)  01/27/16 182 lb 1.3 oz (82.6 kg)  01/22/16 182 lb (82.6 kg)    Cushingoid appearing white female in no obvious distress. Head and neck exam shows no ocular or oral lesions. She has no thrush. No adenopathy is noted in the neck. Lungs show decent breath sounds bilaterally.  She has occasional wheezes. Cardiac exam regular rate and rhythm with no murmurs, rubs or bruits. Abdomen is soft. She is slightly obese. She has good bowel sounds. She has no guarding or rebound tenderness. She has no palpable hepatosplenomegaly. Back exam shows no tenderness over the spine, ribs or hips. Extremities shows some trace edema in her legs. Skin exam shows no rashes, ecchymoses or petechia. Rectal exam shows no tears or fissures. She has no external hemorrhoids area and stool is brownish red and grossly heme positive. Neurological exam shows no focal neurological deficits.   Lab Results  Component Value Date   WBC 5.9 03/09/2016   HGB 10.2 (L) 03/09/2016   HCT 30.3 (L) 03/09/2016   MCV 107 (H) 03/09/2016   PLT 237 03/09/2016   Lab Results  Component Value Date   FERRITIN 48 02/17/2016   IRON 95 02/17/2016   TIBC 359 02/17/2016   UIBC 264 02/17/2016   IRONPCTSAT 26 02/17/2016   Lab Results  Component Value Date   RBC 2.83 (L) 03/09/2016   No results found for: KPAFRELGTCHN, LAMBDASER, KAPLAMBRATIO No results found for: IGGSERUM, IGA, IGMSERUM No results found for: Odetta Pink, SPEI   Chemistry      Component Value Date/Time   NA 131 03/09/2016 0845   NA 135 (L) 11/13/2015 1131   K 3.9 03/09/2016 0845   K 4.4 11/13/2015 1131   CL 104 03/09/2016 0845   CO2 29 03/09/2016 0845   CO2 21 (L) 11/13/2015 1131   BUN 12 03/09/2016 0845   BUN 18.7 11/13/2015 1131   CREATININE 1.1 03/09/2016 0845   CREATININE 0.8 11/13/2015 1131      Component Value Date/Time   CALCIUM 9.1 03/09/2016 0845   CALCIUM 8.9 11/13/2015 1131   ALKPHOS 52 03/09/2016 0845   ALKPHOS 66 11/13/2015 1131   AST 37  03/09/2016 0845   AST 22 11/13/2015 1131   ALT 40 03/09/2016 0845   ALT 27 11/13/2015 1131   BILITOT 0.70 03/09/2016 0845   BILITOT 0.39 11/13/2015 1131     Impression and Plan: Ms. Basara is a 55 yo white female with metastatic poorly differentiated carcinoma of the left lung and wild-type for the typical genetic mutations.   IAnd glad that the PET scan showed that she was responding. I think this is certainly encouraging. She definitely looks good.   We will plan for 3 more cycles of treatment. We will then repeat another scan.  I just wish that she can come off the oxygen. I definitely think that this is a big problem for her. I guess the pneumonitis that she develop from the [facility] just is taking a while to improve.  We will plan to get her back in another 3 weeks.    Volanda Napoleon, MD 8/14/201710:28 AM

## 2016-03-31 ENCOUNTER — Encounter: Payer: Self-pay | Admitting: Hematology & Oncology

## 2016-03-31 ENCOUNTER — Ambulatory Visit (HOSPITAL_BASED_OUTPATIENT_CLINIC_OR_DEPARTMENT_OTHER): Payer: BLUE CROSS/BLUE SHIELD

## 2016-03-31 ENCOUNTER — Other Ambulatory Visit (HOSPITAL_BASED_OUTPATIENT_CLINIC_OR_DEPARTMENT_OTHER): Payer: BLUE CROSS/BLUE SHIELD

## 2016-03-31 ENCOUNTER — Ambulatory Visit (HOSPITAL_BASED_OUTPATIENT_CLINIC_OR_DEPARTMENT_OTHER): Payer: BLUE CROSS/BLUE SHIELD | Admitting: Hematology & Oncology

## 2016-03-31 VITALS — BP 148/78 | HR 125 | Temp 97.7°F | Resp 20 | Ht 64.0 in | Wt 195.1 lb

## 2016-03-31 DIAGNOSIS — C3491 Malignant neoplasm of unspecified part of right bronchus or lung: Secondary | ICD-10-CM

## 2016-03-31 DIAGNOSIS — C3492 Malignant neoplasm of unspecified part of left bronchus or lung: Secondary | ICD-10-CM

## 2016-03-31 DIAGNOSIS — Z5111 Encounter for antineoplastic chemotherapy: Secondary | ICD-10-CM | POA: Diagnosis not present

## 2016-03-31 DIAGNOSIS — K922 Gastrointestinal hemorrhage, unspecified: Secondary | ICD-10-CM

## 2016-03-31 LAB — CBC WITH DIFFERENTIAL (CANCER CENTER ONLY)
BASO#: 0.1 10*3/uL (ref 0.0–0.2)
BASO%: 0.9 % (ref 0.0–2.0)
EOS ABS: 0.2 10*3/uL (ref 0.0–0.5)
EOS%: 3.5 % (ref 0.0–7.0)
HEMATOCRIT: 26.3 % — AB (ref 34.8–46.6)
HEMOGLOBIN: 8.9 g/dL — AB (ref 11.6–15.9)
LYMPH#: 0.8 10*3/uL — AB (ref 0.9–3.3)
LYMPH%: 13.7 % — ABNORMAL LOW (ref 14.0–48.0)
MCH: 36.5 pg — AB (ref 26.0–34.0)
MCHC: 33.8 g/dL (ref 32.0–36.0)
MCV: 108 fL — ABNORMAL HIGH (ref 81–101)
MONO#: 0.7 10*3/uL (ref 0.1–0.9)
MONO%: 12.7 % (ref 0.0–13.0)
NEUT%: 69.2 % (ref 39.6–80.0)
NEUTROS ABS: 4 10*3/uL (ref 1.5–6.5)
Platelets: 340 10*3/uL (ref 145–400)
RBC: 2.44 10*6/uL — AB (ref 3.70–5.32)
RDW: 15.5 % (ref 11.1–15.7)
WBC: 5.8 10*3/uL (ref 3.9–10.0)

## 2016-03-31 LAB — CMP (CANCER CENTER ONLY)
ALBUMIN: 3.3 g/dL (ref 3.3–5.5)
ALT(SGPT): 46 U/L (ref 10–47)
AST: 43 U/L — AB (ref 11–38)
Alkaline Phosphatase: 79 U/L (ref 26–84)
BILIRUBIN TOTAL: 0.7 mg/dL (ref 0.20–1.60)
BUN, Bld: 5 mg/dL — ABNORMAL LOW (ref 7–22)
CALCIUM: 9.1 mg/dL (ref 8.0–10.3)
CO2: 29 mEq/L (ref 18–33)
CREATININE: 0.9 mg/dL (ref 0.6–1.2)
Chloride: 101 mEq/L (ref 98–108)
Glucose, Bld: 172 mg/dL — ABNORMAL HIGH (ref 73–118)
Potassium: 3.2 mEq/L — ABNORMAL LOW (ref 3.3–4.7)
SODIUM: 135 meq/L (ref 128–145)
TOTAL PROTEIN: 6.4 g/dL (ref 6.4–8.1)

## 2016-03-31 LAB — TECHNOLOGIST REVIEW CHCC SATELLITE

## 2016-03-31 MED ORDER — FUROSEMIDE 10 MG/ML IJ SOLN
40.0000 mg | Freq: Once | INTRAMUSCULAR | Status: DC
  Administered 2016-03-31: 40 mg via INTRAMUSCULAR

## 2016-03-31 MED ORDER — PALONOSETRON HCL INJECTION 0.25 MG/5ML
INTRAVENOUS | Status: AC
  Filled 2016-03-31: qty 5

## 2016-03-31 MED ORDER — SODIUM CHLORIDE 0.9% FLUSH
10.0000 mL | INTRAVENOUS | Status: DC | PRN
  Administered 2016-03-31: 10 mL
  Filled 2016-03-31: qty 10

## 2016-03-31 MED ORDER — SODIUM CHLORIDE 0.9 % IV SOLN
495.0000 mg | Freq: Once | INTRAVENOUS | Status: AC
  Administered 2016-03-31: 500 mg via INTRAVENOUS
  Filled 2016-03-31: qty 50

## 2016-03-31 MED ORDER — SODIUM CHLORIDE 0.9 % IV SOLN
Freq: Once | INTRAVENOUS | Status: AC
  Administered 2016-03-31: 13:00:00 via INTRAVENOUS

## 2016-03-31 MED ORDER — HEPARIN SOD (PORK) LOCK FLUSH 100 UNIT/ML IV SOLN
500.0000 [IU] | Freq: Once | INTRAVENOUS | Status: AC | PRN
  Administered 2016-03-31: 500 [IU]
  Filled 2016-03-31: qty 5

## 2016-03-31 MED ORDER — PALONOSETRON HCL INJECTION 0.25 MG/5ML
0.2500 mg | Freq: Once | INTRAVENOUS | Status: AC
  Administered 2016-03-31: 0.25 mg via INTRAVENOUS

## 2016-03-31 MED ORDER — SODIUM CHLORIDE 0.9 % IV SOLN: 567.5000 mg | Freq: Once | INTRAVENOUS | Status: DC

## 2016-03-31 MED ORDER — POTASSIUM CHLORIDE CRYS ER 20 MEQ PO TBCR: 40.0000 meq | EXTENDED_RELEASE_TABLET | Freq: Every day | ORAL | 0 refills | Status: DC

## 2016-03-31 MED ORDER — FUROSEMIDE 10 MG/ML IJ SOLN: 40.0000 mg | Freq: Once | INTRAMUSCULAR | Status: DC

## 2016-03-31 MED ORDER — SODIUM CHLORIDE 0.9 % IV SOLN
10.0000 mg | Freq: Once | INTRAVENOUS | Status: AC
  Administered 2016-03-31: 10 mg via INTRAVENOUS
  Filled 2016-03-31: qty 1

## 2016-03-31 MED ORDER — FUROSEMIDE 10 MG/ML IJ SOLN
INTRAMUSCULAR | Status: AC
  Filled 2016-03-31: qty 4

## 2016-03-31 MED ORDER — SODIUM CHLORIDE 0.9 % IV SOLN
500.0000 mg/m2 | Freq: Once | INTRAVENOUS | Status: AC
  Administered 2016-03-31: 950 mg via INTRAVENOUS
  Filled 2016-03-31: qty 38

## 2016-03-31 MED FILL — POTASSIUM CL ER 20 MEQ TABL: 20 | 7 days supply | Qty: 14 | Fill #0

## 2016-03-31 NOTE — Patient Instructions (Signed)
[place] Discharge Instructions for Patients Receiving Chemotherapy  Today you received the following chemotherapy agents Alimta and Carboplatin.  To help prevent nausea and vomiting after your treatment, we encourage you to take your nausea medication.   If you develop nausea and vomiting that is not controlled by your nausea medication, call the clinic.   BELOW ARE SYMPTOMS THAT SHOULD BE REPORTED IMMEDIATELY:  *FEVER GREATER THAN 100.5 F  *CHILLS WITH OR WITHOUT FEVER  NAUSEA AND VOMITING THAT IS NOT CONTROLLED WITH YOUR NAUSEA MEDICATION  *UNUSUAL SHORTNESS OF BREATH  *UNUSUAL BRUISING OR BLEEDING  TENDERNESS IN MOUTH AND THROAT WITH OR WITHOUT PRESENCE OF ULCERS  *URINARY PROBLEMS  *BOWEL PROBLEMS  UNUSUAL RASH Items with * indicate a potential emergency and should be followed up as soon as possible.  Feel free to call the clinic you have any questions or concerns. The clinic phone number is [phone].  Please show the [place] at check-in to the Emergency Department and triage nurse.

## 2016-03-31 NOTE — Progress Notes (Signed)
Hematology and Oncology Follow Up Visit  Carol Buchanan [id number] 04-16-1961 55 y.o. 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 She's having some breathing difficulties. She has wear supplemental oxygen. She has the pneumonitis. She is gaining some weight. She is off steroids. She has had no cough. She's not noted any obvious melena or bright red blood per rectum.  There is no pain.  She's had no nausea or vomiting.  There's been no fever. She's had no headache. There's been no leg swelling.  Overall, her performance status is ECOG 1.   Medications:    Medication List       Accurate as of 03/31/16 12:58 PM. Always use your most recent med list.          calcium-vitamin D 500-200 MG-UNIT tablet Take 1 tablet by mouth daily.   CENTRUM SILVER PO Take 1 tablet by mouth daily.   chlorhexidine 0.12 % solution Commonly known as:  PERIDEX 15 mLs by Mouth Rinse route 2 (two) times daily.   DULoxetine 60 MG capsule Commonly known as:  CYMBALTA Take 60 mg by mouth daily. Reported on 08/06/2015   fluticasone 50 MCG/ACT nasal spray Commonly known as:  FLONASE Place 2 sprays into both nostrils daily.   folic acid 1 MG tablet Commonly known as:  FOLVITE Take 1 tablet (1 mg total) by mouth daily. Start 5-7 days before Alimta chemotherapy. Continue until 21 days after Alimta completed.   glipiZIDE 5 MG tablet Commonly known as:  GLUCOTROL Take 1 tablet (5 mg total) by mouth 2 (two) times daily before a meal.   ipratropium-albuterol 0.5-2.5 (3) MG/3ML Soln Commonly known as:  DUONEB Take 3 mLs by  nebulization 3 (three) times daily.   lidocaine-prilocaine cream Commonly known as:  EMLA Apply to affected area once   LORazepam 0.5 MG tablet Commonly known as:  ATIVAN Take 1 tablet by mouth every 6 hours if needed for nausea or vomiting   meloxicam 15 MG tablet Commonly known as:  MOBIC Take 15 mg by mouth every other day.   montelukast 10 MG tablet Commonly known as:  SINGULAIR take 1 tablet by mouth at bedtime   omeprazole 20 MG capsule Commonly known as:  PRILOSEC Take 1 capsule (20 mg total) by mouth daily.   ondansetron 8 MG tablet Commonly known as:  ZOFRAN Take 8 mg by mouth 2 (two) times daily as needed for nausea or vomiting.   potassium chloride SA 20 MEQ tablet Commonly known as:  K-DUR,KLOR-CON Take 2 tablets (40 mEq total) by mouth daily.   prochlorperazine 10 MG tablet Commonly known as:  COMPAZINE Take 1 tablet (10 mg total) by mouth every 6 (six) hours as needed (Nausea or vomiting).   simvastatin 20 MG tablet Commonly known as:  ZOCOR Take 20 mg by mouth daily.   Tiotropium Bromide-Olodaterol 2.5-2.5 MCG/ACT Aers Commonly known as:  STIOLTO RESPIMAT Inhale 2 puffs into the lungs daily.   verapamil 180 MG (CO) 24 hr tablet Commonly known as:  COVERA HS Take 180 mg by mouth daily.   vitamin C 1000 MG tablet Take 1,000 mg by mouth  daily.   vitamin E 400 UNIT capsule Take 400 Units by mouth daily.       Allergies: No Known Allergies  Past Medical History, Surgical history, Social history, and Family History were reviewed and updated.  Review of Systems: All other 10 point review of systems is negative.   Physical Exam:  height is 5' 4"  (1.626 m) and weight is 195 lb 1.9 oz (88.5 kg). Her oral temperature is 97.7 F (36.5 C). Her blood pressure is 148/78 (abnormal) and her pulse is 125 (abnormal). Her respiration is 20 and oxygen saturation is 97%.   Wt Readings from Last 3 Encounters:  03/31/16 195 lb 1.9 oz (88.5 kg)  03/09/16 188  lb (85.3 kg)  01/27/16 182 lb 1.3 oz (82.6 kg)    Cushingoid appearing white female in no obvious distress. Head and neck exam shows no ocular or oral lesions. She has no thrush. No adenopathy is noted in the neck. Lungs show decent breath sounds bilaterally.  She has occasional wheezes. Cardiac exam regular rate and rhythm with no murmurs, rubs or bruits. Abdomen is soft. She is slightly obese. She has good bowel sounds. She has no guarding or rebound tenderness. She has no palpable hepatosplenomegaly. Back exam shows no tenderness over the spine, ribs or hips. Extremities shows some trace edema in her legs. Skin exam shows no rashes, ecchymoses or petechia. Rectal exam shows no tears or fissures. She has no external hemorrhoids area and stool is brownish red and grossly heme positive. Neurological exam shows no focal neurological deficits.   Lab Results  Component Value Date   WBC 5.8 03/31/2016   HGB 8.9 (L) 03/31/2016   HCT 26.3 (L) 03/31/2016   MCV 108 (H) 03/31/2016   PLT 340 03/31/2016   Lab Results  Component Value Date   FERRITIN 48 02/17/2016   IRON 95 02/17/2016   TIBC 359 02/17/2016   UIBC 264 02/17/2016   IRONPCTSAT 26 02/17/2016   Lab Results  Component Value Date   RBC 2.44 (L) 03/31/2016   No results found for: KPAFRELGTCHN, LAMBDASER, KAPLAMBRATIO No results found for: IGGSERUM, IGA, IGMSERUM No results found for: Odetta Pink, SPEI   Chemistry      Component Value Date/Time   NA 135 03/31/2016 1152   NA 135 (L) 11/13/2015 1131   K 3.2 (L) 03/31/2016 1152   K 4.4 11/13/2015 1131   CL 101 03/31/2016 1152   CO2 29 03/31/2016 1152   CO2 21 (L) 11/13/2015 1131   BUN 5 (L) 03/31/2016 1152   BUN 18.7 11/13/2015 1131   CREATININE 0.9 03/31/2016 1152   CREATININE 0.8 11/13/2015 1131      Component Value Date/Time   CALCIUM 9.1 03/31/2016 1152   CALCIUM 8.9 11/13/2015 1131   ALKPHOS 79 03/31/2016 1152    ALKPHOS 66 11/13/2015 1131   AST 43 (H) 03/31/2016 1152   AST 22 11/13/2015 1131   ALT 46 03/31/2016 1152   ALT 27 11/13/2015 1131   BILITOT 0.70 03/31/2016 1152   BILITOT 0.39 11/13/2015 1131     Impression and Plan: Carol Buchanan is a 55 yo white female with metastatic poorly differentiated carcinoma of the left lung and wild-type for the typical genetic mutations.   I feel bad that she is having the breathing difficulties. It might be from the weight gain. It also might be from anemia. She probably has some more lower GI bleeding. She does have internal hemorrhoids. I  told her that at some point, he may need to consider a blood transfusion for her. She would like to avoid this if possible.   We will go ahead with her fifth cycle of treatment. I will go ahead and give her a dose of Lasix today. I will give her 40 mg IV. I'll make sure that she takes some oral potassium at home.   We'll plan to get her back in another 3 weeks. I will make sure we check iron studies on her.    Volanda Napoleon, MD 9/5/201712:58 PM

## 2016-03-31 NOTE — Patient Instructions (Signed)

## 2016-04-10 ENCOUNTER — Other Ambulatory Visit: Payer: Self-pay | Admitting: Hematology & Oncology

## 2016-04-10 DIAGNOSIS — R21 Rash and other nonspecific skin eruption: Secondary | ICD-10-CM

## 2016-04-10 DIAGNOSIS — C3492 Malignant neoplasm of unspecified part of left bronchus or lung: Secondary | ICD-10-CM

## 2016-04-10 DIAGNOSIS — C3491 Malignant neoplasm of unspecified part of right bronchus or lung: Secondary | ICD-10-CM

## 2016-04-10 DIAGNOSIS — J700 Acute pulmonary manifestations due to radiation: Secondary | ICD-10-CM

## 2016-04-20 ENCOUNTER — Ambulatory Visit (HOSPITAL_COMMUNITY)
Admission: RE | Admit: 2016-04-20 | Discharge: 2016-04-20 | Disposition: A | Discharge status: Home / Self Care | Payer: BLUE CROSS/BLUE SHIELD | Source: Ambulatory Visit | Attending: Hematology & Oncology | Admitting: Hematology & Oncology

## 2016-04-20 ENCOUNTER — Ambulatory Visit (HOSPITAL_BASED_OUTPATIENT_CLINIC_OR_DEPARTMENT_OTHER): Payer: BLUE CROSS/BLUE SHIELD

## 2016-04-20 ENCOUNTER — Ambulatory Visit (HOSPITAL_BASED_OUTPATIENT_CLINIC_OR_DEPARTMENT_OTHER): Payer: BLUE CROSS/BLUE SHIELD | Admitting: Hematology & Oncology

## 2016-04-20 ENCOUNTER — Other Ambulatory Visit (HOSPITAL_BASED_OUTPATIENT_CLINIC_OR_DEPARTMENT_OTHER): Payer: BLUE CROSS/BLUE SHIELD

## 2016-04-20 ENCOUNTER — Telehealth: Payer: Self-pay | Admitting: *Deleted

## 2016-04-20 ENCOUNTER — Encounter: Payer: Self-pay | Admitting: Hematology & Oncology

## 2016-04-20 VITALS — BP 138/73 | HR 103 | Temp 98.1°F | Resp 18

## 2016-04-20 VITALS — BP 138/80 | HR 122 | Temp 98.0°F | Resp 20 | Ht 64.0 in | Wt 197.0 lb

## 2016-04-20 DIAGNOSIS — D649 Anemia, unspecified: Secondary | ICD-10-CM | POA: Diagnosis not present

## 2016-04-20 DIAGNOSIS — K649 Unspecified hemorrhoids: Secondary | ICD-10-CM | POA: Diagnosis not present

## 2016-04-20 DIAGNOSIS — Z5111 Encounter for antineoplastic chemotherapy: Secondary | ICD-10-CM | POA: Diagnosis not present

## 2016-04-20 DIAGNOSIS — C3492 Malignant neoplasm of unspecified part of left bronchus or lung: Secondary | ICD-10-CM

## 2016-04-20 DIAGNOSIS — R05 Cough: Secondary | ICD-10-CM

## 2016-04-20 DIAGNOSIS — K922 Gastrointestinal hemorrhage, unspecified: Secondary | ICD-10-CM | POA: Diagnosis not present

## 2016-04-20 DIAGNOSIS — C3490 Malignant neoplasm of unspecified part of unspecified bronchus or lung: Secondary | ICD-10-CM

## 2016-04-20 DIAGNOSIS — R21 Rash and other nonspecific skin eruption: Secondary | ICD-10-CM

## 2016-04-20 DIAGNOSIS — C3491 Malignant neoplasm of unspecified part of right bronchus or lung: Secondary | ICD-10-CM

## 2016-04-20 LAB — IRON AND TIBC
%SAT: 25 % (ref 21–57)
Iron: 94 ug/dL (ref 41–142)
TIBC: 380 ug/dL (ref 236–444)
UIBC: 286 ug/dL (ref 120–384)

## 2016-04-20 LAB — TECHNOLOGIST REVIEW CHCC SATELLITE

## 2016-04-20 LAB — CBC WITH DIFFERENTIAL (CANCER CENTER ONLY)
BASO#: 0 10*3/uL (ref 0.0–0.2)
BASO%: 0.8 % (ref 0.0–2.0)
EOS%: 4.3 % (ref 0.0–7.0)
Eosinophils Absolute: 0.2 10*3/uL (ref 0.0–0.5)
HCT: 23.8 % — ABNORMAL LOW (ref 34.8–46.6)
HGB: 8 g/dL — ABNORMAL LOW (ref 11.6–15.9)
LYMPH#: 0.7 10*3/uL — ABNORMAL LOW (ref 0.9–3.3)
LYMPH%: 15.1 % (ref 14.0–48.0)
MCH: 37.2 pg — ABNORMAL HIGH (ref 26.0–34.0)
MCHC: 33.6 g/dL (ref 32.0–36.0)
MCV: 111 fL — ABNORMAL HIGH (ref 81–101)
MONO#: 0.8 10*3/uL (ref 0.1–0.9)
MONO%: 16.8 % — AB (ref 0.0–13.0)
NEUT#: 3.1 10*3/uL (ref 1.5–6.5)
NEUT%: 63 % (ref 39.6–80.0)
Platelets: 312 10*3/uL (ref 145–400)
RBC: 2.15 10*6/uL — ABNORMAL LOW (ref 3.70–5.32)
RDW: 17.1 % — AB (ref 11.1–15.7)
WBC: 4.9 10*3/uL (ref 3.9–10.0)

## 2016-04-20 LAB — PREPARE RBC (CROSSMATCH)

## 2016-04-20 LAB — CMP (CANCER CENTER ONLY)
ALT(SGPT): 23 U/L (ref 10–47)
AST: 33 U/L (ref 11–38)
Albumin: 3.5 g/dL (ref 3.3–5.5)
Alkaline Phosphatase: 70 U/L (ref 26–84)
BILIRUBIN TOTAL: 0.7 mg/dL (ref 0.20–1.60)
BUN, Bld: 4 mg/dL — ABNORMAL LOW (ref 7–22)
CALCIUM: 9 mg/dL (ref 8.0–10.3)
CHLORIDE: 101 meq/L (ref 98–108)
CO2: 28 meq/L (ref 18–33)
CREATININE: 0.7 mg/dL (ref 0.6–1.2)
GLUCOSE: 196 mg/dL — AB (ref 73–118)
Potassium: 3 mEq/L — CL (ref 3.3–4.7)
SODIUM: 135 meq/L (ref 128–145)
Total Protein: 6.2 g/dL — ABNORMAL LOW (ref 6.4–8.1)

## 2016-04-20 LAB — FERRITIN: Ferritin: 81 ng/ml (ref 9–269)

## 2016-04-20 MED ORDER — SODIUM CHLORIDE 0.9% FLUSH
10.0000 mL | INTRAVENOUS | Status: DC | PRN
  Administered 2016-04-20: 10 mL
  Filled 2016-04-20: qty 10

## 2016-04-20 MED ORDER — CYANOCOBALAMIN 1000 MCG/ML IJ SOLN
INTRAMUSCULAR | Status: AC
  Filled 2016-04-20: qty 1

## 2016-04-20 MED ORDER — SODIUM CHLORIDE 0.9 % IV SOLN
540.0000 mg | Freq: Once | INTRAVENOUS | Status: AC
  Administered 2016-04-20: 540 mg via INTRAVENOUS
  Filled 2016-04-20: qty 54

## 2016-04-20 MED ORDER — SODIUM CHLORIDE 0.9 % IV SOLN
500.0000 mg/m2 | Freq: Once | INTRAVENOUS | Status: AC
  Administered 2016-04-20: 950 mg via INTRAVENOUS
  Filled 2016-04-20: qty 18

## 2016-04-20 MED ORDER — HEPARIN SOD (PORK) LOCK FLUSH 100 UNIT/ML IV SOLN
500.0000 [IU] | Freq: Once | INTRAVENOUS | Status: AC | PRN
  Administered 2016-04-20: 500 [IU]
  Filled 2016-04-20: qty 5

## 2016-04-20 MED ORDER — FUROSEMIDE 10 MG/ML IJ SOLN: 20.0000 mg | Freq: Once | INTRAMUSCULAR | Status: DC

## 2016-04-20 MED ORDER — SODIUM CHLORIDE 0.9 % IV SOLN
10.0000 mg | Freq: Once | INTRAVENOUS | Status: AC
  Administered 2016-04-20: 10 mg via INTRAVENOUS
  Filled 2016-04-20: qty 1

## 2016-04-20 MED ORDER — CYANOCOBALAMIN 1000 MCG/ML IJ SOLN
1000.0000 ug | Freq: Once | INTRAMUSCULAR | Status: AC
  Administered 2016-04-20: 1000 ug via INTRAMUSCULAR

## 2016-04-20 MED ORDER — PALONOSETRON HCL INJECTION 0.25 MG/5ML
INTRAVENOUS | Status: AC
  Filled 2016-04-20: qty 5

## 2016-04-20 MED ORDER — PALONOSETRON HCL INJECTION 0.25 MG/5ML
0.2500 mg | Freq: Once | INTRAVENOUS | Status: AC
  Administered 2016-04-20: 0.25 mg via INTRAVENOUS

## 2016-04-20 MED ORDER — SODIUM CHLORIDE 0.9 % IV SOLN
Freq: Once | INTRAVENOUS | Status: AC
  Administered 2016-04-20: 12:00:00 via INTRAVENOUS

## 2016-04-20 NOTE — Progress Notes (Signed)
Hematology and Oncology Follow Up Visit  Carol Buchanan [id number] 02/04/1961 55 y.o. 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 She's having some breathing difficulties. Unfortunately, she is bleeding again. She has hemorrhoids. She has had these looked at in the past. I think she said she had hemorrhoid surgery 20 years ago.  Because of the worsening anemia, she is having more breathing difficulties. She's also retaining more fluid.  She's had a little more of a cough. It is non-productive.  She's had a little bit of a rash. This appears be telangiectasias. This may be from her treatments.  She has had no issues with fever. She's had no headache.  Overall, her performance status is ECOG 1.   Medications:    Medication List       Accurate as of 04/20/16 11:17 AM. Always use your most recent med list.          calcium-vitamin D 500-200 MG-UNIT tablet Take 1 tablet by mouth daily.   CENTRUM SILVER PO Take 1 tablet by mouth daily.   chlorhexidine 0.12 % solution Commonly known as:  PERIDEX 15 mLs by Mouth Rinse route 2 (two) times daily.   DULoxetine 60 MG capsule Commonly known as:  CYMBALTA Take 60 mg by mouth daily. Reported on 08/06/2015   fluticasone 50 MCG/ACT nasal spray Commonly known as:  FLONASE Place 2 sprays into both nostrils daily.   folic acid 1 MG tablet Commonly known as:  FOLVITE Take 1 tablet (1 mg total) by mouth daily. Start 5-7 days before Alimta chemotherapy. Continue until 21 days after Alimta completed.   glipiZIDE 5 MG tablet Commonly known as:  GLUCOTROL Take 1  tablet (5 mg total) by mouth 2 (two) times daily before a meal.   ipratropium-albuterol 0.5-2.5 (3) MG/3ML Soln Commonly known as:  DUONEB Take 3 mLs by nebulization 3 (three) times daily.   lidocaine-prilocaine cream Commonly known as:  EMLA Apply to affected area once   LORazepam 0.5 MG tablet Commonly known as:  ATIVAN Take 1 tablet by mouth every 6 hours if needed for nausea or vomiting   meloxicam 15 MG tablet Commonly known as:  MOBIC Take 15 mg by mouth every other day.   montelukast 10 MG tablet Commonly known as:  SINGULAIR take 1 tablet by mouth at bedtime   omeprazole 20 MG capsule Commonly known as:  PRILOSEC Take 1 capsule (20 mg total) by mouth daily.   ondansetron 8 MG tablet Commonly known as:  ZOFRAN Take 8 mg by mouth 2 (two) times daily as needed for nausea or vomiting.   ondansetron 4 MG tablet Commonly known as:  ZOFRAN take 1 tablet by mouth once daily   prochlorperazine 10 MG tablet Commonly known as:  COMPAZINE take 1 tablet by mouth every 6 hours if needed for nausea or vomiting   simvastatin 20 MG tablet Commonly known as:  ZOCOR Take 20 mg by mouth daily.   Tiotropium Bromide-Olodaterol 2.5-2.5 MCG/ACT Aers Commonly known as:  STIOLTO RESPIMAT Inhale 2 puffs into the lungs daily.   verapamil 180 MG (CO) 24 hr tablet Commonly known as:  COVERA HS Take 180 mg by mouth daily.   vitamin C 1000 MG tablet Take 1,000 mg by mouth daily.   vitamin E 400 UNIT capsule Take 400 Units by mouth daily.       Allergies: No Known Allergies  Past Medical History, Surgical history, Social history, and Family History were reviewed and updated.  Review of Systems: All other 10 point review of systems is negative.   Physical Exam:  height is 5' 4"  (1.626 m) and weight is 197 lb (89.4 kg). Her oral temperature is 98 F (36.7 C). Her blood pressure is 138/80 and her pulse is 122 (abnormal). Her respiration is 20 and oxygen saturation is 93%.    Wt Readings from Last 3 Encounters:  04/20/16 197 lb (89.4 kg)  03/31/16 195 lb 1.9 oz (88.5 kg)  03/09/16 188 lb (85.3 kg)    Cushingoid appearing white female in no obvious distress. Head and neck exam shows no ocular or oral lesions. She has no thrush. No adenopathy is noted in the neck. Lungs show decent breath sounds bilaterally.  She has occasional wheezes. Cardiac exam regular rate and rhythm with no murmurs, rubs or bruits. Abdomen is soft. She is slightly obese. She has good bowel sounds. She has no guarding or rebound tenderness. She has no palpable hepatosplenomegaly. Back exam shows no tenderness over the spine, ribs or hips. Extremities shows some trace edema in her legs. Skin exam shows no rashes, ecchymoses or petechia. Rectal exam shows no tears or fissures. She has no external hemorrhoids area and stool is brownish red and grossly heme positive. Neurological exam shows no focal neurological deficits.   Lab Results  Component Value Date   WBC 4.9 04/20/2016   HGB 8.0 (L) 04/20/2016   HCT 23.8 (L) 04/20/2016   MCV 111 (H) 04/20/2016   PLT 312 04/20/2016   Lab Results  Component Value Date   FERRITIN 48 02/17/2016   IRON 95 02/17/2016   TIBC 359 02/17/2016   UIBC 264 02/17/2016   IRONPCTSAT 26 02/17/2016   Lab Results  Component Value Date   RBC 2.15 (L) 04/20/2016   No results found for: KPAFRELGTCHN, LAMBDASER, KAPLAMBRATIO No results found for: IGGSERUM, IGA, IGMSERUM No results found for: Odetta Pink, SPEI   Chemistry      Component Value Date/Time   NA 135 03/31/2016 1152   NA 135 (L) 11/13/2015 1131   K 3.2 (L) 03/31/2016 1152   K 4.4 11/13/2015 1131   CL 101 03/31/2016 1152   CO2 29 03/31/2016 1152   CO2 21 (L) 11/13/2015 1131   BUN 5 (L) 03/31/2016 1152   BUN 18.7 11/13/2015 1131   CREATININE 0.9 03/31/2016 1152   CREATININE 0.8 11/13/2015 1131      Component Value Date/Time   CALCIUM 9.1  03/31/2016 1152   CALCIUM 8.9 11/13/2015 1131   ALKPHOS 79 03/31/2016 1152   ALKPHOS 66 11/13/2015 1131   AST 43 (H) 03/31/2016 1152   AST 22 11/13/2015 1131   ALT 46 03/31/2016 1152   ALT 27 11/13/2015 1131   BILITOT 0.70 03/31/2016 1152   BILITOT 0.39 11/13/2015 1131     Impression and Plan: Carol Buchanan is a 55 yo white female with metastatic poorly differentiated carcinoma of the left lung and wild-type for the typical genetic mutations.   I Think that the bleeding is probably more of a problem for her right now.  We will have to give her  2 units of blood. I suspect that she also will need some iron. I will check her iron studies.  We probably need to do another scan on her to see how she is responding. I will get this set up for about 2 weeks or so.  I spoke to she and her mom about the blood transfusion. They agree to have the blood transfusion. I believe this will help her breathing. I believe will help some of the swelling that she has.    Volanda Napoleon, MD 9/25/201711:17 AM

## 2016-04-20 NOTE — Patient Instructions (Signed)

## 2016-04-20 NOTE — Telephone Encounter (Signed)
Critical Value Potassium 3.0 Dr Marin Olp aware. No orders received.

## 2016-04-20 NOTE — Progress Notes (Signed)
Ok to treat per Dr Marin Olp with HGB 8.0; patient to be transfused 2 units PRBC's

## 2016-04-21 ENCOUNTER — Ambulatory Visit (HOSPITAL_BASED_OUTPATIENT_CLINIC_OR_DEPARTMENT_OTHER): Payer: BLUE CROSS/BLUE SHIELD

## 2016-04-21 ENCOUNTER — Other Ambulatory Visit: Payer: Self-pay | Admitting: Family

## 2016-04-21 VITALS — BP 132/71 | HR 102 | Temp 98.2°F | Resp 20

## 2016-04-21 DIAGNOSIS — C3490 Malignant neoplasm of unspecified part of unspecified bronchus or lung: Secondary | ICD-10-CM

## 2016-04-21 DIAGNOSIS — K922 Gastrointestinal hemorrhage, unspecified: Secondary | ICD-10-CM

## 2016-04-21 DIAGNOSIS — Z23 Encounter for immunization: Secondary | ICD-10-CM | POA: Diagnosis not present

## 2016-04-21 DIAGNOSIS — C3492 Malignant neoplasm of unspecified part of left bronchus or lung: Secondary | ICD-10-CM | POA: Diagnosis not present

## 2016-04-21 MED ORDER — HEPARIN SOD (PORK) LOCK FLUSH 100 UNIT/ML IV SOLN
500.0000 [IU] | Freq: Every day | INTRAVENOUS | Status: DC | PRN
  Filled 2016-04-21: qty 5

## 2016-04-21 MED ORDER — DIPHENHYDRAMINE HCL 25 MG PO CAPS
25.0000 mg | ORAL_CAPSULE | Freq: Once | ORAL | Status: AC
  Administered 2016-04-21: 25 mg via ORAL

## 2016-04-21 MED ORDER — INFLUENZA VAC SPLIT QUAD 0.5 ML IM SUSY
0.5000 mL | PREFILLED_SYRINGE | Freq: Once | INTRAMUSCULAR | Status: AC
  Administered 2016-04-21: 0.5 mL via INTRAMUSCULAR
  Filled 2016-04-21: qty 0.5

## 2016-04-21 MED ORDER — FUROSEMIDE 10 MG/ML IJ SOLN
20.0000 mg | Freq: Once | INTRAMUSCULAR | Status: AC
  Administered 2016-04-21: 20 mg via INTRAVENOUS

## 2016-04-21 MED ORDER — SODIUM CHLORIDE 0.9 % IV SOLN
250.0000 mL | Freq: Once | INTRAVENOUS | Status: AC
  Administered 2016-04-21: 250 mL via INTRAVENOUS

## 2016-04-21 MED ORDER — ACETAMINOPHEN 325 MG PO TABS
650.0000 mg | ORAL_TABLET | Freq: Once | ORAL | Status: AC
  Administered 2016-04-21: 650 mg via ORAL

## 2016-04-21 MED ORDER — SODIUM CHLORIDE 0.9% FLUSH
10.0000 mL | INTRAVENOUS | Status: DC | PRN
  Filled 2016-04-21: qty 10

## 2016-04-21 MED ORDER — ACETAMINOPHEN 325 MG PO TABS
ORAL_TABLET | ORAL | Status: AC
  Filled 2016-04-21: qty 2

## 2016-04-21 MED ORDER — FUROSEMIDE 10 MG/ML IJ SOLN
INTRAMUSCULAR | Status: AC
  Filled 2016-04-21: qty 4

## 2016-04-21 MED ORDER — DIPHENHYDRAMINE HCL 25 MG PO CAPS
ORAL_CAPSULE | ORAL | Status: AC
  Filled 2016-04-21: qty 1

## 2016-04-21 NOTE — Patient Instructions (Signed)

## 2016-04-22 LAB — TYPE AND SCREEN
ABO/RH(D): O POS
Antibody Screen: NEGATIVE
Unit division: 0
Unit division: 0

## 2016-04-23 ENCOUNTER — Encounter: Payer: Self-pay | Admitting: Emergency Medicine

## 2016-04-23 ENCOUNTER — Ambulatory Visit (INDEPENDENT_AMBULATORY_CARE_PROVIDER_SITE_OTHER): Payer: BLUE CROSS/BLUE SHIELD | Admitting: Emergency Medicine

## 2016-04-23 DIAGNOSIS — C3490 Malignant neoplasm of unspecified part of unspecified bronchus or lung: Secondary | ICD-10-CM

## 2016-04-23 DIAGNOSIS — J9611 Chronic respiratory failure with hypoxia: Secondary | ICD-10-CM | POA: Diagnosis not present

## 2016-04-23 DIAGNOSIS — J449 Chronic obstructive pulmonary disease, unspecified: Secondary | ICD-10-CM

## 2016-04-23 NOTE — Assessment & Plan Note (Signed)
Follow Up with Dr. Jonette Eva as planned PET scan 10/9 for follow up staging. Follow up with Dr. Jonette Eva for follow up labwork re: anemia

## 2016-04-23 NOTE — Patient Instructions (Addendum)
It is great to meet you today. Continue your Stialto Respimat 2 puffs once daily. Remember to use your Proventil as needed for shortness of breath or wheezing. Let us know if you think you need neb treatments. Follow Up labs per Dr. Jonette Eva 10/16 PET CT 10/9 Continue wearing oxygen at 4-5 liters/ min. Oxygen Saturation goal is >90%. Follow up with Dr. Lamonte Sakai in  3 months Please contact office for sooner follow up if symptoms do not improve or worsen or seek emergency care

## 2016-04-23 NOTE — Assessment & Plan Note (Signed)
Continue wearing oxygen at 4-5 L [place] Goal oxygen saturations > 90%

## 2016-04-23 NOTE — Assessment & Plan Note (Signed)
Improving Dyspnea after transfusion Plan: Continue your Stialto Respimat 2 puffs once daily. Remember to use your Proventil as needed for shortness of breath or wheezing. Let us know if you think you need neb treatments. Follow Up labs per Dr. Jonette Eva 10/16 PET CT 10/9 Continue wearing oxygen at 4-5 liters/ min. Oxygen Saturation goal is >90%. Follow up with Dr. Lamonte Sakai in  3 months Please contact office for sooner follow up if symptoms do not improve or worsen or seek emergency care

## 2016-04-23 NOTE — Progress Notes (Signed)
History of Present Illness Carol Buchanan is a 55 y.o. female former smoker with Stage 4 Lung Cancer ( Chemo)  Moderate COPD,  allergies, hyperlipidemia, and HTN followed by Dr. Lamonte Sakai.   55 yo former smoker, hx of HTN, hyperlipidemia, allergies. Presents today for eval of progressive exertional dyspnea. She is s/p R sgy about 6 months ago and has been very inactive and believes she is deconditioned. She hears wheezing with exertion. She still has some cough, better since she quit smoking but still does cough some, non-productive. Her GERD is controlled on omeprazole. She was on spiriva once, not sure if it helped.    04/23/2016 3 month Follow Up: Pt. Presents today for 3 month follow up for COPD. She had blood transfusions for HGB of 8.0 that she feels was due to internal and external  hemorrhoids. She did have a colonoscopy / endoscopy in June but does not know resultsSadie Buchanan). She is much better after being transfused 2 units. She has follow up labs on 10/16. This is being managed by Dr, Jonette Eva, as well as chemo.. She has a PET scan scheduled 05/04/2016 for staging.She states her dyspnea is much improved since transfusion. She is compliant with her Stialto Respimat., but she is not using her Proventil, but recognized she should be using it more. She is using her oxygen at 4-5 L  Day and Night.She denies chest pain. Fever, orthopnea or hemoptysis. Her mother is here today with her and has questions she would like to ask Dr. Lamonte Sakai.    Tests CT Scan 11/25/2015  IMPRESSION: 1. No evidence of pulmonary embolism. 2. Known nodule/mass with the medial aspect of the left upper lobe is grossly unchanged and compatible with provided history of lung cancer. 3. Worsening rather extensive interstitial opacities throughout the lungs with conspicuous sparing of the left upper lobe, progressed compared to PET-CT performed 11/04/2015 as well as more recently performed chest CT performed 11/07/2015 -  findings are not specific with differential considerations include asymmetric pulmonary edema, atypical infection, post treatment pneumonitis, though note, lymphangitic spread of tumor could have a similar appearance. 4. Unchanged appearance of known lytic lesion involving the T4 vertebral body without associated collapse.  Past medical hx Past Medical History:  Diagnosis Date  . COPD (chronic obstructive pulmonary disease) ([place])   . DM (diabetes mellitus), type 2, uncontrolled ([place]) 11/26/2015  . Hyperlipidemia   . Hypertension   . Neuropathy ([place])   . Primary lung cancer with metastasis from lung to other site [facility]) 07/26/2015  . 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 Sinus trouble      Past surgical hx, Family hx, Social hx all reviewed.  Current Outpatient Prescriptions on File Prior to Visit  Medication Sig  . Ascorbic Acid (VITAMIN C) 1000 MG tablet Take 1,000 mg by mouth daily.  . Calcium Carbonate-Vitamin D (CALCIUM-VITAMIN D) 500-200 MG-UNIT tablet Take 1 tablet by mouth daily.  . chlorhexidine (PERIDEX) 0.12 % solution 15 mLs by Mouth Rinse route 2 (two) times daily.  . DULoxetine (CYMBALTA) 60 MG capsule Take 60 mg by mouth daily. Reported on 08/06/2015  . fluticasone (FLONASE) 50 MCG/ACT nasal spray Place 2 sprays into both nostrils daily.  . folic acid (FOLVITE) 1 MG tablet Take 1 tablet (1 mg total) by mouth daily. Start 5-7 days before Alimta chemotherapy. Continue until 21 days after Alimta completed.  Marland Kitchen glipiZIDE (GLUCOTROL) 5 MG tablet Take 1  tablet (5 mg total) by mouth 2 (two) times daily before a meal.  . ipratropium-albuterol (DUONEB) 0.5-2.5 (3) MG/3ML SOLN Take 3 mLs by nebulization 3 (three) times daily.  Marland Kitchen lidocaine-prilocaine (EMLA) cream Apply to affected area once  . LORazepam (ATIVAN) 0.5 MG tablet Take 1 tablet by mouth every 6 hours if needed for nausea or vomiting  . meloxicam (MOBIC) 15 MG  tablet Take 15 mg by mouth every other day.  . montelukast (SINGULAIR) 10 MG tablet take 1 tablet by mouth at bedtime  . Multiple Vitamins-Minerals (CENTRUM SILVER PO) Take 1 tablet by mouth daily.   Marland Kitchen omeprazole (PRILOSEC) 20 MG capsule Take 1 capsule (20 mg total) by mouth daily.  . ondansetron (ZOFRAN) 4 MG tablet take 1 tablet by mouth once daily  . ondansetron (ZOFRAN) 8 MG tablet Take 8 mg by mouth 2 (two) times daily as needed for nausea or vomiting.   . prochlorperazine (COMPAZINE) 10 MG tablet take 1 tablet by mouth every 6 hours if needed for nausea or vomiting  . simvastatin (ZOCOR) 20 MG tablet Take 20 mg by mouth daily.   . Tiotropium Bromide-Olodaterol (STIOLTO RESPIMAT) 2.5-2.5 MCG/ACT AERS Inhale 2 puffs into the lungs daily.  . verapamil (COVERA HS) 180 MG (CO) 24 hr tablet Take 180 mg by mouth daily.   . vitamin E 400 UNIT capsule Take 400 Units by mouth daily.   No current facility-administered medications on file prior to visit.      No Known Allergies  Review Of Systems:  Constitutional:   No  weight loss, night sweats,  Fevers, chills, fatigue, or  lassitude.  HEENT:   No headaches,  Difficulty swallowing,  Tooth/dental problems, or  Sore throat,                No sneezing, itching, ear ache, nasal congestion, post nasal drip,   CV:  No chest pain,  Orthopnea, PND, + swelling in lower extremities, anasarca, dizziness, palpitations, syncope.   GI  No heartburn, indigestion, abdominal pain, nausea, vomiting, diarrhea, change in bowel habits, loss of appetite, bloody stools.   Resp: + shortness of breath with exertion less at rest.  No excess mucus, no productive cough,  No non-productive cough,  No coughing up of blood.  No change in color of mucus.  No wheezing.  No chest wall deformity  Skin: no rash or lesions.  GU: no dysuria, change in color of urine, no urgency or frequency.  No flank pain, no hematuria   MS:  No joint pain or swelling.  No decreased range  of motion.  No back pain.  Psych:  No change in mood or affect. No depression or anxiety.  No memory loss.   Vital Signs BP 126/72 (BP Location: Right Arm, Cuff Size: Normal)   Pulse (!) 113   Ht '5\' 4"'$  (1.626 m)   Wt 194 lb (88 kg)   SpO2 97%   BMI 33.30 kg/m    Physical Exam:  General- No distress,  A&Ox3, pleasant ENT: No sinus tenderness, TM clear, pale nasal mucosa, no oral exudate,no post nasal drip, no LAN Cardiac: S1, S2, regular rate and rhythm, no murmur Chest: No wheeze/ rales/ dullness;diminished per bases, no accessory muscle use, no nasal flaring, no sternal retractions Abd.: Soft Non-tender Ext: No clubbing cyanosis, edema Neuro:  normal strength Skin: No rashes, warm and dry Psych: normal mood and behavior   Assessment/Plan  COPD (chronic obstructive pulmonary disease) (HCC) Improving Dyspnea after transfusion  Plan: Continue your Stialto Respimat 2 puffs once daily. Remember to use your Proventil as needed for shortness of breath or wheezing. Let us know if you think you need neb treatments. Follow Up labs per Dr. Jonette Eva 10/16 PET CT 10/9 Continue wearing oxygen at 4-5 liters/ min. Oxygen Saturation goal is >90%. Follow up with Dr. Lamonte Sakai in  3 months Please contact office for sooner follow up if symptoms do not improve or worsen or seek emergency care    Primary lung cancer with metastasis from lung to other site [facility]) Follow Up with Dr. Jonette Eva as planned PET scan 10/9 for follow up staging. Follow up with Dr. Jonette Eva for follow up labwork re: anemia  Chronic respiratory failure ([place]) Continue wearing oxygen at 4-5 L [place] Goal oxygen saturations > 90%    Magdalen Spatz, NP 04/23/2016  2:51 PM   Attending Note:  I have examined patient, reviewed labs, studies and notes. I have discussed the case with Gladstone Pih, and I agree with the data and plans as amended above. 55 yo woman with COPD, hypoxemic resp failure and stage 4 NSCLCA being managed with  chemo by Dr Katheran Awe. She has experienced complication of anemia in setting chemo, hemorrhoids that contributed to dyspnea, improved with PRBC. Currently stable on Stiolto, will continue same. She will have PET scan next month to assess status of her malignancy.    Baltazar Apo, MD, PhD 04/23/2016, 3:54 PM [facility] [phone] or if no answer [phone]

## 2016-05-01 ENCOUNTER — Other Ambulatory Visit: Payer: Self-pay | Admitting: Emergency Medicine

## 2016-05-03 ENCOUNTER — Other Ambulatory Visit: Payer: Self-pay | Admitting: Family

## 2016-05-04 ENCOUNTER — Encounter (HOSPITAL_COMMUNITY)
Admission: RE | Admit: 2016-05-04 | Discharge: 2016-05-04 | Disposition: A | Discharge status: Home / Self Care | Payer: BLUE CROSS/BLUE SHIELD | Source: Ambulatory Visit | Attending: Hematology & Oncology | Admitting: Hematology & Oncology

## 2016-05-04 DIAGNOSIS — C3490 Malignant neoplasm of unspecified part of unspecified bronchus or lung: Secondary | ICD-10-CM | POA: Diagnosis present

## 2016-05-04 LAB — GLUCOSE, CAPILLARY: Glucose-Capillary: 144 mg/dL — ABNORMAL HIGH (ref 65–99)

## 2016-05-04 MED ORDER — FLUDEOXYGLUCOSE F - 18 (FDG) INJECTION
9.7600 | Freq: Once | INTRAVENOUS | Status: AC | PRN
  Administered 2016-05-04: 9.76 via INTRAVENOUS

## 2016-05-11 ENCOUNTER — Other Ambulatory Visit (HOSPITAL_BASED_OUTPATIENT_CLINIC_OR_DEPARTMENT_OTHER): Payer: BLUE CROSS/BLUE SHIELD

## 2016-05-11 ENCOUNTER — Other Ambulatory Visit: Payer: Self-pay | Admitting: *Deleted

## 2016-05-11 ENCOUNTER — Encounter: Payer: Self-pay | Admitting: Hematology & Oncology

## 2016-05-11 ENCOUNTER — Ambulatory Visit (HOSPITAL_BASED_OUTPATIENT_CLINIC_OR_DEPARTMENT_OTHER): Payer: BLUE CROSS/BLUE SHIELD | Admitting: Hematology & Oncology

## 2016-05-11 ENCOUNTER — Ambulatory Visit (HOSPITAL_BASED_OUTPATIENT_CLINIC_OR_DEPARTMENT_OTHER): Payer: BLUE CROSS/BLUE SHIELD

## 2016-05-11 VITALS — BP 153/79 | HR 106 | Resp 20

## 2016-05-11 VITALS — BP 152/87 | HR 116 | Temp 98.2°F | Resp 22 | Ht 64.0 in

## 2016-05-11 DIAGNOSIS — Z5111 Encounter for antineoplastic chemotherapy: Secondary | ICD-10-CM | POA: Diagnosis not present

## 2016-05-11 DIAGNOSIS — K922 Gastrointestinal hemorrhage, unspecified: Secondary | ICD-10-CM | POA: Diagnosis not present

## 2016-05-11 DIAGNOSIS — C3491 Malignant neoplasm of unspecified part of right bronchus or lung: Secondary | ICD-10-CM

## 2016-05-11 DIAGNOSIS — C3492 Malignant neoplasm of unspecified part of left bronchus or lung: Secondary | ICD-10-CM

## 2016-05-11 DIAGNOSIS — C3490 Malignant neoplasm of unspecified part of unspecified bronchus or lung: Secondary | ICD-10-CM

## 2016-05-11 LAB — CMP (CANCER CENTER ONLY)
ALT(SGPT): 32 U/L (ref 10–47)
AST: 38 U/L (ref 11–38)
Albumin: 3.7 g/dL (ref 3.3–5.5)
Alkaline Phosphatase: 56 U/L (ref 26–84)
BUN: 8 mg/dL (ref 7–22)
CHLORIDE: 101 meq/L (ref 98–108)
CO2: 30 meq/L (ref 18–33)
CREATININE: 1 mg/dL (ref 0.6–1.2)
Calcium: 9.4 mg/dL (ref 8.0–10.3)
GLUCOSE: 136 mg/dL — AB (ref 73–118)
POTASSIUM: 3.6 meq/L (ref 3.3–4.7)
SODIUM: 136 meq/L (ref 128–145)
TOTAL PROTEIN: 6.5 g/dL (ref 6.4–8.1)
Total Bilirubin: 0.6 mg/dl (ref 0.20–1.60)

## 2016-05-11 LAB — CBC WITH DIFFERENTIAL (CANCER CENTER ONLY)
BASO#: 0 10*3/uL (ref 0.0–0.2)
BASO%: 0.7 % (ref 0.0–2.0)
EOS ABS: 0.1 10*3/uL (ref 0.0–0.5)
EOS%: 2.2 % (ref 0.0–7.0)
HCT: 29.5 % — ABNORMAL LOW (ref 34.8–46.6)
HGB: 10.1 g/dL — ABNORMAL LOW (ref 11.6–15.9)
LYMPH#: 0.9 10*3/uL (ref 0.9–3.3)
LYMPH%: 14.9 % (ref 14.0–48.0)
MCH: 35.4 pg — AB (ref 26.0–34.0)
MCHC: 34.2 g/dL (ref 32.0–36.0)
MCV: 104 fL — AB (ref 81–101)
MONO#: 0.9 10*3/uL (ref 0.1–0.9)
MONO%: 14.7 % — AB (ref 0.0–13.0)
NEUT#: 3.9 10*3/uL (ref 1.5–6.5)
NEUT%: 67.5 % (ref 39.6–80.0)
PLATELETS: 253 10*3/uL (ref 145–400)
RBC: 2.85 10*6/uL — AB (ref 3.70–5.32)
RDW: 18.7 % — AB (ref 11.1–15.7)
WBC: 5.8 10*3/uL (ref 3.9–10.0)

## 2016-05-11 MED ORDER — DIPHENHYDRAMINE HCL 50 MG/ML IJ SOLN
INTRAMUSCULAR | Status: AC
  Filled 2016-05-11: qty 1

## 2016-05-11 MED ORDER — PALONOSETRON HCL INJECTION 0.25 MG/5ML
0.2500 mg | Freq: Once | INTRAVENOUS | Status: AC
  Administered 2016-05-11: 0.25 mg via INTRAVENOUS

## 2016-05-11 MED ORDER — PALONOSETRON HCL INJECTION 0.25 MG/5ML
INTRAVENOUS | Status: AC
  Filled 2016-05-11: qty 5

## 2016-05-11 MED ORDER — SODIUM CHLORIDE 0.9 % IV SOLN
550.0000 mg | Freq: Once | INTRAVENOUS | Status: AC
  Administered 2016-05-11: 550 mg via INTRAVENOUS
  Filled 2016-05-11: qty 55

## 2016-05-11 MED ORDER — DEXAMETHASONE SODIUM PHOSPHATE 10 MG/ML IJ SOLN
10.0000 mg | Freq: Once | INTRAMUSCULAR | Status: AC
  Administered 2016-05-11: 10 mg via INTRAVENOUS

## 2016-05-11 MED ORDER — SODIUM CHLORIDE 0.9% FLUSH
10.0000 mL | INTRAVENOUS | Status: DC | PRN
  Administered 2016-05-11: 10 mL
  Filled 2016-05-11: qty 10

## 2016-05-11 MED ORDER — SODIUM CHLORIDE 0.9 % IV SOLN
Freq: Once | INTRAVENOUS | Status: AC
  Administered 2016-05-11: 15:00:00 via INTRAVENOUS

## 2016-05-11 MED ORDER — PEMETREXED DISODIUM CHEMO INJECTION 500 MG
500.0000 mg/m2 | Freq: Once | INTRAVENOUS | Status: AC
  Administered 2016-05-11: 1000 mg via INTRAVENOUS
  Filled 2016-05-11: qty 40

## 2016-05-11 MED ORDER — DEXAMETHASONE SODIUM PHOSPHATE 10 MG/ML IJ SOLN
INTRAMUSCULAR | Status: AC
  Filled 2016-05-11: qty 1

## 2016-05-11 MED ORDER — DIPHENHYDRAMINE HCL 50 MG/ML IJ SOLN
25.0000 mg | Freq: Once | INTRAMUSCULAR | Status: AC
  Administered 2016-05-11: 25 mg via INTRAVENOUS

## 2016-05-11 MED ORDER — HEPARIN SOD (PORK) LOCK FLUSH 100 UNIT/ML IV SOLN
500.0000 [IU] | Freq: Once | INTRAVENOUS | Status: AC | PRN
  Administered 2016-05-11: 500 [IU]
  Filled 2016-05-11: qty 5

## 2016-05-11 MED ORDER — TRIAMTERENE-HCTZ 37.5-25 MG PO TABS: 1.0000 | ORAL_TABLET | Freq: Every day | ORAL | 4 refills | Status: DC

## 2016-05-11 NOTE — Patient Instructions (Signed)
[facility] Discharge Instructions for Patients Receiving Chemotherapy  Today you received the following chemotherapy agents Alimta and Carboplatin.  To help prevent nausea and vomiting after your treatment, we encourage you to take your nausea medication.   If you develop nausea and vomiting that is not controlled by your nausea medication, call the clinic.   BELOW ARE SYMPTOMS THAT SHOULD BE REPORTED IMMEDIATELY:  *FEVER GREATER THAN 100.5 F  *CHILLS WITH OR WITHOUT FEVER  NAUSEA AND VOMITING THAT IS NOT CONTROLLED WITH YOUR NAUSEA MEDICATION  *UNUSUAL SHORTNESS OF BREATH  *UNUSUAL BRUISING OR BLEEDING  TENDERNESS IN MOUTH AND THROAT WITH OR WITHOUT PRESENCE OF ULCERS  *URINARY PROBLEMS  *BOWEL PROBLEMS  UNUSUAL RASH Items with * indicate a potential emergency and should be followed up as soon as possible.  Feel free to call the clinic you have any questions or concerns. The clinic phone number is [phone].  Please show the [facility] at check-in to the Emergency Department and triage nurse.

## 2016-05-11 NOTE — Progress Notes (Signed)
1610- Patient complaining of severe itching to bilateral hands and patient noted to be extremely red in the face. Patient states she had some heartburn just prior to the itching which has now resolved.  Denies any other complaints.   Per Dr Marin Olp; Benadryl '25mg'$  given IVP  1635 Dr Marin Olp at bedside with patient. Patient states feeling better; redness in her face resolved. Itching in hands dimimished significantly.

## 2016-05-11 NOTE — Progress Notes (Signed)
Hematology and Oncology Follow Up Visit  Carol Buchanan [id number] 23-May-1961 55 y.o. 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 here today for follow-up. She looks a lot better. She feels well. She still wearing the oxygen.  We did go ahead and do a PET scan on her. The PET scan was done on October 9. The PET scan, thankfully, did not show any evidence of residual or recurrent hypermetabolic disease in the lung. She had no evidence of disease in the T4 vertebral body. She did have the [facility] I'm just so thankful that she has done well.  She is still having some GI bleeding. I think we may have to get her back to gastroenterology. I don't know they have to reevaluate her.  The issue that we have is that with her GI bleeding, I cannot use Avastin as part of her protocol. I will left a use Avastin in the maintenance portion if possible.  Her appetite is doing well. She has had no nausea or vomiting. She has had no cough.  She has had some fluid retention. She does have some fluid under the eyes. We'll try her on some Maxide to see if this helps.  There is been no rashes. She's had no bruises. She's had no fever.   Overall, her performance status is ECOG 1.   Medications:    Medication List       Accurate as of 05/11/16  2:24 PM. Always use your most recent med list.          calcium-vitamin D 500-200 MG-UNIT tablet Take 1 tablet by mouth daily.   CENTRUM SILVER PO Take 1 tablet by mouth daily.   chlorhexidine 0.12 % solution Commonly known as:  PERIDEX 15 mLs by Mouth Rinse route 2  (two) times daily.   DULoxetine 60 MG capsule Commonly known as:  CYMBALTA Take 60 mg by mouth daily. Reported on 08/06/2015   fluticasone 50 MCG/ACT nasal spray Commonly known as:  FLONASE Place 2 sprays into both nostrils daily.   folic acid 1 MG tablet Commonly known as:  FOLVITE Take 1 tablet (1 mg total) by mouth daily. Start 5-7 days before Alimta chemotherapy. Continue until 21 days after Alimta completed.   glipiZIDE 5 MG tablet Commonly known as:  GLUCOTROL Take 1 tablet (5 mg total) by mouth 2 (two) times daily before a meal.   ipratropium-albuterol 0.5-2.5 (3) MG/3ML Soln Commonly known as:  DUONEB Take 3 mLs by nebulization 3 (three) times daily.   lidocaine-prilocaine cream Commonly known as:  EMLA Apply to affected area once   LORazepam 0.5 MG tablet Commonly known as:  ATIVAN Take 1 tablet by mouth every 6 hours if needed for nausea or vomiting   meloxicam 15 MG tablet Commonly known as:  MOBIC Take 15 mg by mouth every other day.   montelukast 10 MG tablet Commonly known as:  SINGULAIR take 1 tablet by mouth at bedtime   omeprazole 20 MG capsule Commonly known as:  PRILOSEC Take 1 capsule (20 mg total) by mouth daily.   ondansetron 8 MG tablet Commonly known as:  ZOFRAN  Take 8 mg by mouth 2 (two) times daily as needed for nausea or vomiting.   prochlorperazine 10 MG tablet Commonly known as:  COMPAZINE take 1 tablet by mouth every 6 hours if needed for nausea or vomiting   simvastatin 20 MG tablet Commonly known as:  ZOCOR Take 20 mg by mouth daily.   Tiotropium Bromide-Olodaterol 2.5-2.5 MCG/ACT Aers Commonly known as:  STIOLTO RESPIMAT Inhale 2 puffs into the lungs daily.   triamterene-hydrochlorothiazide 37.5-25 MG tablet Commonly known as:  MAXZIDE-25 Take 1 tablet by mouth daily.   verapamil 180 MG (CO) 24 hr tablet Commonly known as:  COVERA HS Take 180 mg by mouth daily.   vitamin C 1000 MG tablet Take 1,000 mg by mouth daily.    vitamin E 400 UNIT capsule Take 400 Units by mouth daily.       Allergies: No Known Allergies  Past Medical History, Surgical history, Social history, and Family History were reviewed and updated.  Review of Systems: All other 10 point review of systems is negative.   Physical Exam:  height is _0  (1.626 m). Her oral temperature is 98.2 F (36.8 C). Her blood pressure is 152/87 (abnormal) and her pulse is 116 (abnormal). Her respiration is 22 (abnormal) and oxygen saturation is 99%.   Wt Readings from Last 3 Encounters:  04/23/16 194 lb (88 kg)  04/20/16 197 lb (89.4 kg)  03/31/16 195 lb 1.9 oz (88.5 kg)    Cushingoid appearing white female in no obvious distress. Head and neck exam shows no ocular or oral lesions. She has no thrush. No adenopathy is noted in the neck. Lungs show decent breath sounds bilaterally.  She has occasional wheezes. Cardiac exam regular rate and rhythm with no murmurs, rubs or bruits. Abdomen is soft. She is slightly obese. She has good bowel sounds. She has no guarding or rebound tenderness. She has no palpable hepatosplenomegaly. Back exam shows no tenderness over the spine, ribs or hips. Extremities shows some trace edema in her legs. Skin exam shows no rashes, ecchymoses or petechia. Rectal exam shows no tears or fissures. She has no external hemorrhoids area and stool is brownish red and grossly heme positive. Neurological exam shows no focal neurological deficits.   Lab Results  Component Value Date   WBC 5.8 05/11/2016   HGB 10.1 (L) 05/11/2016   HCT 29.5 (L) 05/11/2016   MCV 104 (H) 05/11/2016   PLT 253 05/11/2016   Lab Results  Component Value Date   FERRITIN 81 04/20/2016   IRON 94 04/20/2016   TIBC 380 04/20/2016   UIBC 286 04/20/2016   IRONPCTSAT 25 04/20/2016   Lab Results  Component Value Date   RBC 2.85 (L) 05/11/2016   No results found for: KPAFRELGTCHN, LAMBDASER, KAPLAMBRATIO No results found for: IGGSERUM, IGA,  IGMSERUM No results found for: Odetta Pink, SPEI   Chemistry      Component Value Date/Time   NA 136 05/11/2016 1316   NA 135 (L) 11/13/2015 1131   K 3.6 05/11/2016 1316   K 4.4 11/13/2015 1131   CL 101 05/11/2016 1316   CO2 30 05/11/2016 1316   CO2 21 (L) 11/13/2015 1131   BUN 8 05/11/2016 1316   BUN 18.7 11/13/2015 1131   CREATININE 1.0 05/11/2016 1316   CREATININE 0.8 11/13/2015 1131      Component Value Date/Time   CALCIUM 9.4 05/11/2016 1316   CALCIUM 8.9 11/13/2015 1131   ALKPHOS 56  05/11/2016 1316   ALKPHOS 66 11/13/2015 1131   AST 38 05/11/2016 1316   AST 22 11/13/2015 1131   ALT 32 05/11/2016 1316   ALT 27 11/13/2015 1131   BILITOT 0.60 05/11/2016 1316   BILITOT 0.39 11/13/2015 1131     Impression and Plan: Ms. Omeara is a 55 yo white female with metastatic poorly differentiated carcinoma of the left lung and wild-type for the typical genetic mutations.   I am glad that her PET scan shows a nice response. As such, we will continue her on therapy. I will not change the doublet that we are using.  Because of her recurrent GI bleeding, she really cannot take Avastin.  I will have to speak with gastroenterology. I do not know if they need to re-scope her.  I will plan to get her back in another 3 weeks.    Volanda Napoleon, MD 10/16/20172:24 PM

## 2016-05-12 ENCOUNTER — Other Ambulatory Visit: Payer: Self-pay | Admitting: Pharmacist

## 2016-05-12 LAB — FERRITIN: FERRITIN: 115 ng/mL (ref 9–269)

## 2016-05-12 LAB — IRON AND TIBC
%SAT: 31 % (ref 21–57)
Iron: 116 ug/dL (ref 41–142)
TIBC: 374 ug/dL (ref 236–444)
UIBC: 258 ug/dL (ref 120–384)

## 2016-05-18 ENCOUNTER — Telehealth: Payer: Self-pay | Admitting: *Deleted

## 2016-05-18 ENCOUNTER — Other Ambulatory Visit: Payer: Self-pay | Admitting: Family

## 2016-05-18 ENCOUNTER — Other Ambulatory Visit: Payer: Self-pay | Admitting: Hematology & Oncology

## 2016-05-18 DIAGNOSIS — B373 Candidiasis of vulva and vagina: Secondary | ICD-10-CM

## 2016-05-18 DIAGNOSIS — C3492 Malignant neoplasm of unspecified part of left bronchus or lung: Secondary | ICD-10-CM

## 2016-05-18 DIAGNOSIS — B3731 Acute candidiasis of vulva and vagina: Secondary | ICD-10-CM

## 2016-05-18 DIAGNOSIS — C3491 Malignant neoplasm of unspecified part of right bronchus or lung: Secondary | ICD-10-CM

## 2016-05-18 DIAGNOSIS — K5900 Constipation, unspecified: Secondary | ICD-10-CM

## 2016-05-18 MED ORDER — FLUCONAZOLE 150 MG PO TABS: 150.0000 mg | ORAL_TABLET | Freq: Every day | ORAL | 0 refills | Status: DC

## 2016-05-18 NOTE — Progress Notes (Signed)
Patient called with c/o vaginal candidiasis. I spoke with our pharmacist Barnett Applebaum and we will do a one time dose of Diflucan 150 mg PO. She will contact us if she develops any musculoskeletal pain or worsening of infection.

## 2016-05-18 NOTE — Telephone Encounter (Signed)
Patient c/o vaginal yeast infection.  Laverna Peace NP will send in prescription. Patient aware.

## 2016-05-25 ENCOUNTER — Encounter: Payer: Self-pay | Admitting: Hematology & Oncology

## 2016-05-27 ENCOUNTER — Telehealth: Payer: Self-pay | Admitting: *Deleted

## 2016-05-27 NOTE — Telephone Encounter (Signed)
Received call from [facility] about patient capsule endoscopy that was done on patient last Friday.  This test was completely normal per dr. Michail Sermon.  Dr. Marin Olp informed.

## 2016-06-01 ENCOUNTER — Other Ambulatory Visit: Payer: Self-pay | Admitting: Family

## 2016-06-01 ENCOUNTER — Ambulatory Visit (HOSPITAL_BASED_OUTPATIENT_CLINIC_OR_DEPARTMENT_OTHER): Payer: BLUE CROSS/BLUE SHIELD

## 2016-06-01 ENCOUNTER — Other Ambulatory Visit (HOSPITAL_BASED_OUTPATIENT_CLINIC_OR_DEPARTMENT_OTHER): Payer: BLUE CROSS/BLUE SHIELD

## 2016-06-01 ENCOUNTER — Ambulatory Visit (HOSPITAL_BASED_OUTPATIENT_CLINIC_OR_DEPARTMENT_OTHER): Payer: BLUE CROSS/BLUE SHIELD | Admitting: Hematology & Oncology

## 2016-06-01 VITALS — BP 123/66 | HR 103 | Temp 98.3°F | Resp 18 | Ht 64.0 in | Wt 190.0 lb

## 2016-06-01 DIAGNOSIS — C3492 Malignant neoplasm of unspecified part of left bronchus or lung: Secondary | ICD-10-CM

## 2016-06-01 DIAGNOSIS — Z5111 Encounter for antineoplastic chemotherapy: Secondary | ICD-10-CM | POA: Diagnosis not present

## 2016-06-01 DIAGNOSIS — C3491 Malignant neoplasm of unspecified part of right bronchus or lung: Secondary | ICD-10-CM

## 2016-06-01 DIAGNOSIS — B373 Candidiasis of vulva and vagina: Secondary | ICD-10-CM

## 2016-06-01 DIAGNOSIS — D500 Iron deficiency anemia secondary to blood loss (chronic): Secondary | ICD-10-CM

## 2016-06-01 DIAGNOSIS — B3731 Acute candidiasis of vulva and vagina: Secondary | ICD-10-CM

## 2016-06-01 DIAGNOSIS — C7951 Secondary malignant neoplasm of bone: Secondary | ICD-10-CM

## 2016-06-01 LAB — CBC WITH DIFFERENTIAL (CANCER CENTER ONLY)
BASO#: 0 10*3/uL (ref 0.0–0.2)
BASO%: 0.5 % (ref 0.0–2.0)
EOS ABS: 0.2 10*3/uL (ref 0.0–0.5)
EOS%: 2.9 % (ref 0.0–7.0)
HCT: 27 % — ABNORMAL LOW (ref 34.8–46.6)
HGB: 9.7 g/dL — ABNORMAL LOW (ref 11.6–15.9)
LYMPH#: 0.8 10*3/uL — ABNORMAL LOW (ref 0.9–3.3)
LYMPH%: 13.3 % — AB (ref 14.0–48.0)
MCH: 36.6 pg — AB (ref 26.0–34.0)
MCHC: 35.9 g/dL (ref 32.0–36.0)
MCV: 102 fL — ABNORMAL HIGH (ref 81–101)
MONO#: 0.8 10*3/uL (ref 0.1–0.9)
MONO%: 13 % (ref 0.0–13.0)
NEUT#: 4.2 10*3/uL (ref 1.5–6.5)
NEUT%: 70.3 % (ref 39.6–80.0)
PLATELETS: 199 10*3/uL (ref 145–400)
RBC: 2.65 10*6/uL — AB (ref 3.70–5.32)
RDW: 18.2 % — ABNORMAL HIGH (ref 11.1–15.7)
WBC: 5.9 10*3/uL (ref 3.9–10.0)

## 2016-06-01 LAB — CMP (CANCER CENTER ONLY)
ALT(SGPT): 33 U/L (ref 10–47)
AST: 38 U/L (ref 11–38)
Albumin: 3.7 g/dL (ref 3.3–5.5)
Alkaline Phosphatase: 62 U/L (ref 26–84)
BUN, Bld: 15 mg/dL (ref 7–22)
CHLORIDE: 94 meq/L — AB (ref 98–108)
CO2: 29 meq/L (ref 18–33)
CREATININE: 0.9 mg/dL (ref 0.6–1.2)
Calcium: 9.7 mg/dL (ref 8.0–10.3)
GLUCOSE: 136 mg/dL — AB (ref 73–118)
Potassium: 3.1 mEq/L — ABNORMAL LOW (ref 3.3–4.7)
SODIUM: 138 meq/L (ref 128–145)
TOTAL PROTEIN: 7 g/dL (ref 6.4–8.1)
Total Bilirubin: 0.7 mg/dl (ref 0.20–1.60)

## 2016-06-01 LAB — LACTATE DEHYDROGENASE: LDH: 252 U/L — AB (ref 125–245)

## 2016-06-01 MED ORDER — SODIUM CHLORIDE 0.9 % IV SOLN
525.0000 mg/m2 | Freq: Once | INTRAVENOUS | Status: AC
  Administered 2016-06-01: 1000 mg via INTRAVENOUS
  Filled 2016-06-01: qty 40

## 2016-06-01 MED ORDER — PALONOSETRON HCL INJECTION 0.25 MG/5ML: 0.2500 mg | Freq: Once | INTRAVENOUS | Status: DC

## 2016-06-01 MED ORDER — PROCHLORPERAZINE MALEATE 10 MG PO TABS
10.0000 mg | ORAL_TABLET | Freq: Once | ORAL | Status: AC
  Administered 2016-06-01: 10 mg via ORAL

## 2016-06-01 MED ORDER — FLUCONAZOLE 200 MG PO TABS: 200.0000 mg | ORAL_TABLET | ORAL | 0 refills | Status: DC

## 2016-06-01 MED ORDER — SODIUM CHLORIDE 0.9% FLUSH
10.0000 mL | INTRAVENOUS | Status: DC | PRN
  Administered 2016-06-01: 10 mL
  Filled 2016-06-01: qty 10

## 2016-06-01 MED ORDER — SODIUM CHLORIDE 0.9 % IV SOLN
Freq: Once | INTRAVENOUS | Status: AC
  Administered 2016-06-01: 13:00:00 via INTRAVENOUS

## 2016-06-01 MED ORDER — CARBOPLATIN CHEMO INTRADERMAL TEST DOSE 100MCG/0.02ML: 100.0000 ug | Freq: Once | INTRADERMAL | Status: DC

## 2016-06-01 MED ORDER — PROCHLORPERAZINE MALEATE 10 MG PO TABS
ORAL_TABLET | ORAL | Status: AC
  Filled 2016-06-01: qty 1

## 2016-06-01 MED ORDER — HEPARIN SOD (PORK) LOCK FLUSH 100 UNIT/ML IV SOLN
500.0000 [IU] | Freq: Once | INTRAVENOUS | Status: AC | PRN
  Administered 2016-06-01: 500 [IU]
  Filled 2016-06-01: qty 5

## 2016-06-01 MED ORDER — DEXAMETHASONE SODIUM PHOSPHATE 10 MG/ML IJ SOLN: 10.0000 mg | Freq: Once | INTRAMUSCULAR | Status: DC

## 2016-06-01 NOTE — Patient Instructions (Signed)
[facility] Discharge Instructions for Patients Receiving Chemotherapy  Today you received the following chemotherapy agents Alimta  To help prevent nausea and vomiting after your treatment, we encourage you to take your nausea medication   If you develop nausea and vomiting that is not controlled by your nausea medication, call the clinic.   BELOW ARE SYMPTOMS THAT SHOULD BE REPORTED IMMEDIATELY:  *FEVER GREATER THAN 100.5 F  *CHILLS WITH OR WITHOUT FEVER  NAUSEA AND VOMITING THAT IS NOT CONTROLLED WITH YOUR NAUSEA MEDICATION  *UNUSUAL SHORTNESS OF BREATH  *UNUSUAL BRUISING OR BLEEDING  TENDERNESS IN MOUTH AND THROAT WITH OR WITHOUT PRESENCE OF ULCERS  *URINARY PROBLEMS  *BOWEL PROBLEMS  UNUSUAL RASH Items with * indicate a potential emergency and should be followed up as soon as possible.  Feel free to call the clinic you have any questions or concerns. The clinic phone number is [phone].  Please show the CHEMO ALERT CARD at check-in to the Emergency Department and triage nurse.   

## 2016-06-01 NOTE — Progress Notes (Signed)
Hematology and Oncology Follow Up Visit  Carol Buchanan [id number] Feb 21, 1961 55 y.o. 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     Interim History:  Carol Buchanan here today for follow-up. She had an allergic reaction to carboplatin on with her last cycle. As such, I am holding off on further carboplatinum on her. I will just go with Alimta and treat this as maintenance.   She had a capsule endoscopy. This did not show any obvious bleeding source from the small intestine.   I still have thought about using Avastin. She's never had abdominal surgery. I think that she probably could tolerate Avastin if we needed to add it.   Her breathing seems to be doing better. She's not having as much short of breath. Her Oxygen requirement is down to 4 L/min.   Her appetite is doing okay. She's had no nausea or vomiting. She's had some constipation which is chronic.  She's not retain as much fluid. I think we have her on some Maxide. I did this is helping.  Overall, her performance status is ECOG 1.   Medications:    Medication List       Accurate as of 06/01/16 12:40 PM. Always use your most recent med list.          calcium-vitamin D 500-200 MG-UNIT tablet Take 1 tablet by mouth daily.   CENTRUM SILVER PO Take 1 tablet by mouth daily.   chlorhexidine 0.12 % solution Commonly known as:  PERIDEX 15 mLs by Mouth Rinse route 2 (two) times daily.   DULoxetine 60 MG capsule Commonly known as:  CYMBALTA Take 60 mg by mouth daily. Reported on 08/06/2015   fluconazole 200 MG tablet Commonly known as:  DIFLUCAN Take 1 tablet (200  mg total) by mouth every 3 (three) days.   fluticasone 50 MCG/ACT nasal spray Commonly known as:  FLONASE Place 2 sprays into both nostrils daily.   folic acid 1 MG tablet Commonly known as:  FOLVITE Take 1 tablet (1 mg total) by mouth daily. Start 5-7 days before Alimta chemotherapy. Continue until 21 days after Alimta completed.   glipiZIDE 5 MG tablet Commonly known as:  GLUCOTROL Take 1 tablet (5 mg total) by mouth 2 (two) times daily before a meal.   ipratropium-albuterol 0.5-2.5 (3) MG/3ML Soln Commonly known as:  DUONEB Take 3 mLs by nebulization 3 (three) times daily.   lidocaine-prilocaine cream Commonly known as:  EMLA Apply to affected area once   LORazepam 0.5 MG tablet Commonly known as:  ATIVAN Take 1 tablet by mouth every 6 hours if needed for nausea or vomiting   meloxicam 15 MG tablet Commonly known as:  MOBIC Take 15 mg by mouth every other day.   montelukast 10 MG tablet Commonly known as:  SINGULAIR take 1 tablet by mouth at bedtime   omeprazole 20 MG capsule Commonly known as:  PRILOSEC Take 1 capsule (20 mg total) by mouth daily.   ondansetron 8 MG tablet Commonly known as:  ZOFRAN Take 8 mg by mouth 2 (two) times daily as needed for nausea or vomiting.   polyethylene glycol powder powder Commonly known as:  GLYCOLAX/MIRALAX take 17GM (DISSOLVED IN WATER) by mouth twice a day   prochlorperazine 10 MG tablet Commonly known as:  COMPAZINE take 1 tablet by mouth every 6 hours if needed for nausea or vomiting   simvastatin 20 MG tablet Commonly known as:  ZOCOR Take 20 mg by mouth daily.   Tiotropium Bromide-Olodaterol 2.5-2.5 MCG/ACT Aers Commonly known as:  STIOLTO RESPIMAT Inhale 2 puffs into the lungs daily.   triamterene-hydrochlorothiazide 37.5-25 MG tablet Commonly known as:  MAXZIDE-25 Take 1 tablet by mouth daily.   verapamil 180 MG (CO) 24 hr tablet Commonly known as:  COVERA HS Take 180 mg by mouth daily.   vitamin C 1000  MG tablet Take 1,000 mg by mouth daily.   vitamin E 400 UNIT capsule Take 400 Units by mouth daily.       Allergies: No Known Allergies  Past Medical History, Surgical history, Social history, and Family History were reviewed and updated.  Review of Systems: All other 10 point review of systems is negative.   Physical Exam:  height is 5' 4"  (1.626 m) and weight is 190 lb (86.2 kg). Her oral temperature is 98.3 F (36.8 C). Her blood pressure is 123/66 and her pulse is 103 (abnormal). Her respiration is 18 and oxygen saturation is 99%.   Wt Readings from Last 3 Encounters:  06/01/16 190 lb (86.2 kg)  04/23/16 194 lb (88 kg)  04/20/16 197 lb (89.4 kg)    Cushingoid appearing white female in no obvious distress. Head and neck exam shows no ocular or oral lesions. She has no thrush. No adenopathy is noted in the neck. Lungs show decent breath sounds bilaterally.  She has occasional wheezes. Cardiac exam regular rate and rhythm with no murmurs, rubs or bruits. Abdomen is soft. She is slightly obese. She has good bowel sounds. She has no guarding or rebound tenderness. She has no palpable hepatosplenomegaly. Back exam shows no tenderness over the spine, ribs or hips. Extremities shows no edema in her legs. She has good range of motion of her joints. Skin exam shows no rashes, ecchymoses or petechia. Rectal exam shows no tears or fissures. She has no external hemorrhoids area and stool is brownish red and grossly heme positive. Neurological exam shows no focal neurological deficits.   Lab Results  Component Value Date   WBC 5.9 06/01/2016   HGB 9.7 (L) 06/01/2016   HCT 27.0 (L) 06/01/2016   MCV 102 (H) 06/01/2016   PLT 199 06/01/2016   Lab Results  Component Value Date   FERRITIN 115 05/11/2016   IRON 116 05/11/2016   TIBC 374 05/11/2016   UIBC 258 05/11/2016   IRONPCTSAT 31 05/11/2016   Lab Results  Component Value Date   RBC 2.65 (L) 06/01/2016   No results found for:  KPAFRELGTCHN, LAMBDASER, KAPLAMBRATIO No results found for: IGGSERUM, IGA, IGMSERUM No results found for: Ronnald Ramp, A1GS, A2GS, Violet Baldy, MSPIKE, SPEI   Chemistry      Component Value Date/Time   NA 138 06/01/2016 1101   NA 135 (L) 11/13/2015 1131   K 3.1 (L) 06/01/2016 1101   K 4.4 11/13/2015 1131   CL 94 (L) 06/01/2016 1101   CO2 29 06/01/2016 1101   CO2 21 (L) 11/13/2015 1131   BUN 15 06/01/2016 1101   BUN 18.7 11/13/2015 1131   CREATININE 0.9 06/01/2016 1101   CREATININE 0.8 11/13/2015 1131      Component Value Date/Time   CALCIUM 9.7 06/01/2016 1101  CALCIUM 8.9 11/13/2015 1131   ALKPHOS 62 06/01/2016 1101   ALKPHOS 66 11/13/2015 1131   AST 38 06/01/2016 1101   AST 22 11/13/2015 1131   ALT 33 06/01/2016 1101   ALT 27 11/13/2015 1131   BILITOT 0.70 06/01/2016 1101   BILITOT 0.39 11/13/2015 1131     Impression and Plan: Ms. Georgia is a 55 yo white female with metastatic poorly differentiated carcinoma of the left lung and wild-type for the typical genetic mutations.   We will proceed with Alimta on its own. Again this probably will be considered maintenance. I think this is very reasonable for her.  I will get another PET scan after the next cycle of treatment. If that looks okay, that we'll continue with Alimta on its own. It we do see some increased in activity of her malignancy, then I will add Avastin to the Alimta. I talked to her and her mom about this. They're both in agreement.  Volanda Napoleon, MD 11/6/201712:40 PM

## 2016-06-01 NOTE — Progress Notes (Signed)
Patient has persistent vaginal yeast infection despite one dose of Diflucan 150 mg PO. We will now have her take Diflucan 200 mg PO every 3 days for 3 doses. She will notify us if this does not improve.

## 2016-06-22 ENCOUNTER — Ambulatory Visit (HOSPITAL_BASED_OUTPATIENT_CLINIC_OR_DEPARTMENT_OTHER): Payer: BLUE CROSS/BLUE SHIELD | Admitting: Hematology & Oncology

## 2016-06-22 ENCOUNTER — Telehealth: Payer: Self-pay | Admitting: *Deleted

## 2016-06-22 ENCOUNTER — Ambulatory Visit: Payer: BLUE CROSS/BLUE SHIELD

## 2016-06-22 ENCOUNTER — Other Ambulatory Visit (HOSPITAL_BASED_OUTPATIENT_CLINIC_OR_DEPARTMENT_OTHER): Payer: BLUE CROSS/BLUE SHIELD

## 2016-06-22 ENCOUNTER — Ambulatory Visit (HOSPITAL_BASED_OUTPATIENT_CLINIC_OR_DEPARTMENT_OTHER): Payer: BLUE CROSS/BLUE SHIELD

## 2016-06-22 VITALS — BP 140/79 | HR 101 | Temp 98.0°F | Resp 20 | Wt 185.1 lb

## 2016-06-22 DIAGNOSIS — C3432 Malignant neoplasm of lower lobe, left bronchus or lung: Secondary | ICD-10-CM | POA: Diagnosis not present

## 2016-06-22 DIAGNOSIS — D649 Anemia, unspecified: Secondary | ICD-10-CM

## 2016-06-22 DIAGNOSIS — Z5111 Encounter for antineoplastic chemotherapy: Secondary | ICD-10-CM | POA: Diagnosis not present

## 2016-06-22 DIAGNOSIS — D500 Iron deficiency anemia secondary to blood loss (chronic): Secondary | ICD-10-CM

## 2016-06-22 DIAGNOSIS — C3492 Malignant neoplasm of unspecified part of left bronchus or lung: Secondary | ICD-10-CM

## 2016-06-22 DIAGNOSIS — K922 Gastrointestinal hemorrhage, unspecified: Secondary | ICD-10-CM

## 2016-06-22 DIAGNOSIS — C3491 Malignant neoplasm of unspecified part of right bronchus or lung: Secondary | ICD-10-CM

## 2016-06-22 DIAGNOSIS — C7951 Secondary malignant neoplasm of bone: Secondary | ICD-10-CM

## 2016-06-22 LAB — CBC WITH DIFFERENTIAL (CANCER CENTER ONLY)
BASO#: 0.1 10*3/uL (ref 0.0–0.2)
BASO%: 0.8 % (ref 0.0–2.0)
EOS%: 1.8 % (ref 0.0–7.0)
Eosinophils Absolute: 0.1 10*3/uL (ref 0.0–0.5)
HEMATOCRIT: 27.2 % — AB (ref 34.8–46.6)
HGB: 9.4 g/dL — ABNORMAL LOW (ref 11.6–15.9)
LYMPH#: 0.7 10*3/uL — ABNORMAL LOW (ref 0.9–3.3)
LYMPH%: 11.8 % — AB (ref 14.0–48.0)
MCH: 37.5 pg — ABNORMAL HIGH (ref 26.0–34.0)
MCHC: 34.6 g/dL (ref 32.0–36.0)
MCV: 108 fL — ABNORMAL HIGH (ref 81–101)
MONO#: 1 10*3/uL — ABNORMAL HIGH (ref 0.1–0.9)
MONO%: 16.5 % — AB (ref 0.0–13.0)
NEUT#: 4.2 10*3/uL (ref 1.5–6.5)
NEUT%: 69.1 % (ref 39.6–80.0)
PLATELETS: 347 10*3/uL (ref 145–400)
RBC: 2.51 10*6/uL — ABNORMAL LOW (ref 3.70–5.32)
RDW: 16.6 % — AB (ref 11.1–15.7)
WBC: 6 10*3/uL (ref 3.9–10.0)

## 2016-06-22 LAB — CMP (CANCER CENTER ONLY)
ALT(SGPT): 32 U/L (ref 10–47)
AST: 41 U/L — AB (ref 11–38)
Albumin: 4.3 g/dL (ref 3.3–5.5)
Alkaline Phosphatase: 66 U/L (ref 26–84)
BILIRUBIN TOTAL: 0.7 mg/dL (ref 0.20–1.60)
BUN: 9 mg/dL (ref 7–22)
CALCIUM: 9.1 mg/dL (ref 8.0–10.3)
CO2: 29 meq/L (ref 18–33)
Chloride: 97 mEq/L — ABNORMAL LOW (ref 98–108)
Creat: 1 mg/dl (ref 0.6–1.2)
GLUCOSE: 92 mg/dL (ref 73–118)
Potassium: 3 mEq/L — CL (ref 3.3–4.7)
SODIUM: 142 meq/L (ref 128–145)
Total Protein: 7.4 g/dL (ref 6.4–8.1)

## 2016-06-22 LAB — LACTATE DEHYDROGENASE: LDH: 304 U/L — ABNORMAL HIGH (ref 125–245)

## 2016-06-22 MED ORDER — SODIUM CHLORIDE 0.9 % IV SOLN
525.0000 mg/m2 | Freq: Once | INTRAVENOUS | Status: AC
  Administered 2016-06-22: 1000 mg via INTRAVENOUS
  Filled 2016-06-22: qty 40

## 2016-06-22 MED ORDER — SODIUM CHLORIDE 0.9 % IV SOLN
Freq: Once | INTRAVENOUS | Status: AC
  Administered 2016-06-22: 12:00:00 via INTRAVENOUS

## 2016-06-22 MED ORDER — PROCHLORPERAZINE MALEATE 10 MG PO TABS
10.0000 mg | ORAL_TABLET | Freq: Once | ORAL | Status: AC
  Administered 2016-06-22: 10 mg via ORAL

## 2016-06-22 MED ORDER — SODIUM CHLORIDE 0.9% FLUSH
10.0000 mL | INTRAVENOUS | Status: DC | PRN
  Administered 2016-06-22: 10 mL
  Filled 2016-06-22: qty 10

## 2016-06-22 MED ORDER — PROCHLORPERAZINE MALEATE 10 MG PO TABS
ORAL_TABLET | ORAL | Status: AC
  Filled 2016-06-22: qty 1

## 2016-06-22 MED ORDER — HEPARIN SOD (PORK) LOCK FLUSH 100 UNIT/ML IV SOLN
500.0000 [IU] | Freq: Once | INTRAVENOUS | Status: AC | PRN
  Administered 2016-06-22: 500 [IU]
  Filled 2016-06-22: qty 5

## 2016-06-22 MED ORDER — DENOSUMAB 120 MG/1.7ML ~~LOC~~ SOLN
120.0000 mg | Freq: Once | SUBCUTANEOUS | Status: AC
  Administered 2016-06-22: 120 mg via SUBCUTANEOUS
  Filled 2016-06-22: qty 1.7

## 2016-06-22 NOTE — Progress Notes (Signed)
Hematology and Oncology Follow Up Visit  JEMMA RASP [id number] 05-02-1961 55 y.o. 06/22/2016   Principle Diagnosis:  Metastatic poorly differentiated carcinoma of the left lung-(-) for    EGFR/ALK/ROS - -PD-L1 (+) Pneumonitis -probably secondary to [facility]  Current Therapy:   Patient status post radiation therapy Xgeva 120 mg subcutaneous monthly Pembrolizumab '200mg'$  IV q  3 wks - s/p cycle 4 Carboplatin/Alimta-s/p cycyle #7  -- carboplatin omitted due to allergic rxn.     Interim History:  Ms. Matt is here today for follow-up. She had an allergic reaction to carboplatin on with her last cycle. As such, I am holding off on further carboplatinum on her. I will just go with Alimta and treat this as maintenance.   She had a capsule endoscopy. This did not show any obvious bleeding source from the small intestine.   I still have thought about using Avastin. She's never had abdominal surgery. I think that she probably could tolerate Avastin if we needed to add it.   Her breathing seems to be doing better. She's not having as much short of breath. Her Oxygen requirement is down to 3 L/min. we did an oxygen saturation on her in the office. It was 99%.  She is worried that she will will be list her insurance at the end of the year. I told her that we would still take care of her. There would be some way for her to have treatment. I know that the pharmaceutical company can help.  Her appetite is doing okay. She and her mother went to [employer] for Thanksgiving. They waited an hour and a half to eat.  She's had no nausea or vomiting. She's had some constipation which is chronic.  She has not seen any obvious melena or bright red blood per him. I think that she probably does have some bleeding.   Overall, her performance status is ECOG 1.   Medications:    Medication List       Accurate as of 06/22/16 11:54 AM. Always use your most recent med list.            calcium-vitamin D 500-200 MG-UNIT tablet Take 1 tablet by mouth daily.   CENTRUM SILVER PO Take 1 tablet by mouth daily.   chlorhexidine 0.12 % solution Commonly known as:  PERIDEX 15 mLs by Mouth Rinse route 2 (two) times daily.   DULoxetine 60 MG capsule Commonly known as:  CYMBALTA Take 60 mg by mouth daily. Reported on 08/06/2015   fluconazole 200 MG tablet Commonly known as:  DIFLUCAN Take 1 tablet (200 mg total) by mouth every 3 (three) days.   fluticasone 50 MCG/ACT nasal spray Commonly known as:  FLONASE Place 2 sprays into both nostrils daily.   folic acid 1 MG tablet Commonly known as:  FOLVITE Take 1 tablet (1 mg total) by mouth daily. Start 5-7 days before Alimta chemotherapy. Continue until 21 days after Alimta completed.   glipiZIDE 5 MG tablet Commonly known as:  GLUCOTROL Take 1 tablet (5 mg total) by mouth 2 (two) times daily before a meal.   ipratropium-albuterol 0.5-2.5 (3) MG/3ML Soln Commonly known as:  DUONEB Take 3 mLs by nebulization 3 (three) times daily.   lidocaine-prilocaine cream Commonly known as:  EMLA Apply to affected area once   LORazepam 0.5 MG tablet Commonly known as:  ATIVAN Take 1 tablet by mouth every 6 hours if needed for nausea or vomiting   meloxicam 15 MG tablet Commonly known  as:  MOBIC Take 15 mg by mouth every other day.   montelukast 10 MG tablet Commonly known as:  SINGULAIR take 1 tablet by mouth at bedtime   omeprazole 20 MG capsule Commonly known as:  PRILOSEC Take 1 capsule (20 mg total) by mouth daily.   ondansetron 8 MG tablet Commonly known as:  ZOFRAN Take 8 mg by mouth 2 (two) times daily as needed for nausea or vomiting.   polyethylene glycol powder powder Commonly known as:  GLYCOLAX/MIRALAX take 17GM (DISSOLVED IN WATER) by mouth twice a day   prochlorperazine 10 MG tablet Commonly known as:  COMPAZINE take 1 tablet by mouth every 6 hours if needed for nausea or vomiting   simvastatin 20 MG  tablet Commonly known as:  ZOCOR Take 20 mg by mouth daily.   Tiotropium Bromide-Olodaterol 2.5-2.5 MCG/ACT Aers Commonly known as:  STIOLTO RESPIMAT Inhale 2 puffs into the lungs daily.   triamterene-hydrochlorothiazide 37.5-25 MG tablet Commonly known as:  MAXZIDE-25 Take 1 tablet by mouth daily.   verapamil 180 MG (CO) 24 hr tablet Commonly known as:  COVERA HS Take 180 mg by mouth daily.   vitamin C 1000 MG tablet Take 1,000 mg by mouth daily.   vitamin E 400 UNIT capsule Take 400 Units by mouth daily.       Allergies: No Known Allergies  Past Medical History, Surgical history, Social history, and Family History were reviewed and updated.  Review of Systems: All other 10 point review of systems is negative.   Physical Exam:  weight is 185 lb 1.3 oz (84 kg). Her oral temperature is 98 F (36.7 C). Her blood pressure is 140/79 and her pulse is 101 (abnormal). Her respiration is 20 and oxygen saturation is 97%.   Wt Readings from Last 3 Encounters:  06/22/16 185 lb 1.3 oz (84 kg)  06/01/16 190 lb (86.2 kg)  04/23/16 194 lb (88 kg)    Cushingoid appearing white female in no obvious distress. Head and neck exam shows no ocular or oral lesions. She has no thrush. No adenopathy is noted in the neck. Lungs show decent breath sounds bilaterally.  She has occasional wheezes. Cardiac exam regular rate and rhythm with no murmurs, rubs or bruits. Abdomen is soft. She is slightly obese. She has good bowel sounds. She has no guarding or rebound tenderness. She has no palpable hepatosplenomegaly. Back exam shows no tenderness over the spine, ribs or hips. Extremities shows no edema in her legs. She has good range of motion of her joints. Skin exam shows no rashes, ecchymoses or petechia. Rectal exam shows no tears or fissures. She has no external hemorrhoids area and stool is brownish red and grossly heme positive. Neurological exam shows no focal neurological deficits.   Lab Results    Component Value Date   WBC 6.0 06/22/2016   HGB 9.4 (L) 06/22/2016   HCT 27.2 (L) 06/22/2016   MCV 108 (H) 06/22/2016   PLT 347 06/22/2016   Lab Results  Component Value Date   FERRITIN 115 05/11/2016   IRON 116 05/11/2016   TIBC 374 05/11/2016   UIBC 258 05/11/2016   IRONPCTSAT 31 05/11/2016   Lab Results  Component Value Date   RBC 2.51 (L) 06/22/2016   No results found for: KPAFRELGTCHN, LAMBDASER, KAPLAMBRATIO No results found for: IGGSERUM, IGA, IGMSERUM No results found for: TOTALPROTELP, ALBUMINELP, A1GS, A2GS, BETS, BETA2SER, GAMS, MSPIKE, SPEI   Chemistry      Component Value Date/Time  NA 142 06/22/2016 1043   NA 135 (L) 11/13/2015 1131   K 3.0 (LL) 06/22/2016 1043   K 4.4 11/13/2015 1131   CL 97 (L) 06/22/2016 1043   CO2 29 06/22/2016 1043   CO2 21 (L) 11/13/2015 1131   BUN 9 06/22/2016 1043   BUN 18.7 11/13/2015 1131   CREATININE 1.0 06/22/2016 1043   CREATININE 0.8 11/13/2015 1131      Component Value Date/Time   CALCIUM 9.1 06/22/2016 1043   CALCIUM 8.9 11/13/2015 1131   ALKPHOS 66 06/22/2016 1043   ALKPHOS 66 11/13/2015 1131   AST 41 (H) 06/22/2016 1043   AST 22 11/13/2015 1131   ALT 32 06/22/2016 1043   ALT 27 11/13/2015 1131   BILITOT 0.70 06/22/2016 1043   BILITOT 0.39 11/13/2015 1131     Impression and Plan: Ms. Disanti is a 55 yo white female with metastatic poorly differentiated carcinoma of the left lung and wild-type for the typical genetic mutations.   We will proceed with Alimta on its own. Again this probably will be considered maintenance. I think this is very reasonable for her.  Her hemoglobin is dropping slowly. Again, I have to believe that she is having chronic low-grade GI bleed.  I will get another PET scan after this cycle of treatment. If that looks okay, that we'll continue with Alimta on its own. It we do see some increased in activity of her malignancy, then I will add Avastin to the Alimta. I talked to her and her  mom about this. They're both in agreement.  We will see her back in 3 weeks.  Volanda Napoleon, MD 11/27/201711:54 AM

## 2016-06-22 NOTE — Addendum Note (Signed)
Addended by: Burney Gauze R on: 06/22/2016 12:23 PM   Modules accepted: Orders

## 2016-06-22 NOTE — Patient Instructions (Signed)
Pemetrexed injection What is this medicine? PEMETREXED (PEM e TREX ed) is a chemotherapy drug. This medicine affects cells that are rapidly growing, such as cancer cells and cells in your mouth and stomach. It is usually used to treat lung cancers like non-small cell lung cancer and mesothelioma. It may also be used to treat other cancers. This medicine may be used for other purposes; ask your health care provider or pharmacist if you have questions. COMMON BRAND NAME(S): Alimta What should I tell my health care provider before I take this medicine? They need to know if you have any of these conditions: -if you frequently drink alcohol containing beverages -infection (especially a virus infection such as chickenpox, cold sores, or herpes) -kidney disease -liver disease -low blood counts, like low platelets, red bloods, or white blood cells -an unusual or allergic reaction to pemetrexed, mannitol, other medicines, foods, dyes, or preservatives -pregnant or trying to get pregnant -breast-feeding How should I use this medicine? This drug is given as an infusion into a vein. It is administered in a hospital or clinic by a specially trained health care professional. Talk to your pediatrician regarding the use of this medicine in children. Special care may be needed. Overdosage: If you think you have taken too much of this medicine contact a poison control center or emergency room at once. NOTE: This medicine is only for you. Do not share this medicine with others. What if I miss a dose? It is important not to miss your dose. Call your doctor or health care professional if you are unable to keep an appointment. What may interact with this medicine? -aspirin and aspirin-like medicines -medicines to increase blood counts like filgrastim, pegfilgrastim, sargramostim -methotrexate -NSAIDS, medicines for pain and inflammation, like ibuprofen or naproxen -probenecid -pyrimethamine -vaccines Talk to  your doctor or health care professional before taking any of these medicines: -acetaminophen -aspirin -ibuprofen -ketoprofen -naproxen This list may not describe all possible interactions. Give your health care provider a list of all the medicines, herbs, non-prescription drugs, or dietary supplements you use. Also tell them if you smoke, drink alcohol, or use illegal drugs. Some items may interact with your medicine. What should I watch for while using this medicine? Visit your doctor for checks on your progress. This drug may make you feel generally unwell. This is not uncommon, as chemotherapy can affect healthy cells as well as cancer cells. Report any side effects. Continue your course of treatment even though you feel ill unless your doctor tells you to stop. In some cases, you may be given additional medicines to help with side effects. Follow all directions for their use. Call your doctor or health care professional for advice if you get a fever, chills or sore throat, or other symptoms of a cold or flu. Do not treat yourself. This drug decreases your body's ability to fight infections. Try to avoid being around people who are sick. This medicine may increase your risk to bruise or bleed. Call your doctor or health care professional if you notice any unusual bleeding. Be careful brushing and flossing your teeth or using a toothpick because you may get an infection or bleed more easily. If you have any dental work done, tell your dentist you are receiving this medicine. Avoid taking products that contain aspirin, acetaminophen, ibuprofen, naproxen, or ketoprofen unless instructed by your doctor. These medicines may hide a fever. Call your doctor or health care professional if you get diarrhea or mouth sores. Do not treat  yourself. To protect your kidneys, drink water or other fluids as directed while you are taking this medicine. Men and women must use effective birth control while taking this  medicine. You may also need to continue using effective birth control for a time after stopping this medicine. Do not become pregnant while taking this medicine. Tell your doctor right away if you think that you or your partner might be pregnant. There is a potential for serious side effects to an unborn child. Talk to your health care professional or pharmacist for more information. Do not breast-feed an infant while taking this medicine. This medicine may lower sperm counts. What side effects may I notice from receiving this medicine? Side effects that you should report to your doctor or health care professional as soon as possible: -allergic reactions like skin rash, itching or hives, swelling of the face, lips, or tongue -low blood counts - this medicine may decrease the number of white blood cells, red blood cells and platelets. You may be at increased risk for infections and bleeding. -signs of infection - fever or chills, cough, sore throat, pain or difficulty passing urine -signs of decreased platelets or bleeding - bruising, pinpoint red spots on the skin, black, tarry stools, blood in the urine -signs of decreased red blood cells - unusually weak or tired, fainting spells, lightheadedness -breathing problems, like a dry cough -changes in emotions or moods -chest pain -confusion -diarrhea -high blood pressure -mouth or throat sores or ulcers -pain, swelling, warmth in the leg -pain on swallowing -swelling of the ankles, feet, hands -trouble passing urine or change in the amount of urine -vomiting -yellowing of the eyes or skin Side effects that usually do not require medical attention (report to your doctor or health care professional if they continue or are bothersome): -hair loss -loss of appetite -nausea -stomach upset This list may not describe all possible side effects. Call your doctor for medical advice about side effects. You may report side effects to FDA at  1-800-FDA-1088. Where should I keep my medicine? This drug is given in a hospital or clinic and will not be stored at home. NOTE: This sheet is a summary. It may not cover all possible information. If you have questions about this medicine, talk to your doctor, pharmacist, or health care provider.  2017 Elsevier/Gold Standard (2008-02-14 13:24:03)

## 2016-06-22 NOTE — Telephone Encounter (Signed)
Critical Value Potassium 3.0 Dr Ennever notified. No orders at this time.  

## 2016-06-23 ENCOUNTER — Other Ambulatory Visit: Payer: Self-pay | Admitting: Hematology & Oncology

## 2016-06-23 DIAGNOSIS — C3491 Malignant neoplasm of unspecified part of right bronchus or lung: Secondary | ICD-10-CM

## 2016-06-23 LAB — IRON AND TIBC
%SAT: 31 % (ref 21–57)
Iron: 126 ug/dL (ref 41–142)
TIBC: 408 ug/dL (ref 236–444)
UIBC: 282 ug/dL (ref 120–384)

## 2016-06-23 LAB — FERRITIN: FERRITIN: 153 ng/mL (ref 9–269)

## 2016-07-05 ENCOUNTER — Other Ambulatory Visit: Payer: Self-pay | Admitting: Family

## 2016-07-05 ENCOUNTER — Other Ambulatory Visit: Payer: Self-pay | Admitting: Hematology & Oncology

## 2016-07-05 DIAGNOSIS — R0600 Dyspnea, unspecified: Secondary | ICD-10-CM

## 2016-07-05 DIAGNOSIS — C3492 Malignant neoplasm of unspecified part of left bronchus or lung: Secondary | ICD-10-CM

## 2016-07-06 ENCOUNTER — Other Ambulatory Visit: Payer: Self-pay | Admitting: Hematology & Oncology

## 2016-07-06 ENCOUNTER — Ambulatory Visit (HOSPITAL_COMMUNITY)
Admission: RE | Admit: 2016-07-06 | Discharge: 2016-07-06 | Disposition: A | Discharge status: Home / Self Care | Payer: BLUE CROSS/BLUE SHIELD | Source: Ambulatory Visit | Attending: Hematology & Oncology | Admitting: Hematology & Oncology

## 2016-07-06 ENCOUNTER — Encounter: Payer: Self-pay | Admitting: Pharmacist

## 2016-07-06 DIAGNOSIS — C3491 Malignant neoplasm of unspecified part of right bronchus or lung: Secondary | ICD-10-CM | POA: Diagnosis present

## 2016-07-06 DIAGNOSIS — I2510 Atherosclerotic heart disease of native coronary artery without angina pectoris: Secondary | ICD-10-CM | POA: Insufficient documentation

## 2016-07-06 DIAGNOSIS — J90 Pleural effusion, not elsewhere classified: Secondary | ICD-10-CM | POA: Diagnosis not present

## 2016-07-06 DIAGNOSIS — I313 Pericardial effusion (noninflammatory): Secondary | ICD-10-CM

## 2016-07-06 DIAGNOSIS — I3139 Other pericardial effusion (noninflammatory): Secondary | ICD-10-CM

## 2016-07-06 DIAGNOSIS — K922 Gastrointestinal hemorrhage, unspecified: Secondary | ICD-10-CM | POA: Diagnosis present

## 2016-07-06 DIAGNOSIS — I700 Atherosclerosis of aorta: Secondary | ICD-10-CM | POA: Diagnosis not present

## 2016-07-06 LAB — GLUCOSE, CAPILLARY: Glucose-Capillary: 140 mg/dL — ABNORMAL HIGH (ref 65–99)

## 2016-07-06 MED ORDER — FLUDEOXYGLUCOSE F - 18 (FDG) INJECTION
9.2300 | Freq: Once | INTRAVENOUS | Status: AC | PRN
  Administered 2016-07-06: 9.23 via INTRAVENOUS

## 2016-07-13 ENCOUNTER — Ambulatory Visit: Payer: BLUE CROSS/BLUE SHIELD

## 2016-07-13 ENCOUNTER — Ambulatory Visit (HOSPITAL_BASED_OUTPATIENT_CLINIC_OR_DEPARTMENT_OTHER): Payer: BLUE CROSS/BLUE SHIELD

## 2016-07-13 ENCOUNTER — Other Ambulatory Visit (HOSPITAL_BASED_OUTPATIENT_CLINIC_OR_DEPARTMENT_OTHER): Payer: BLUE CROSS/BLUE SHIELD

## 2016-07-13 ENCOUNTER — Ambulatory Visit (HOSPITAL_BASED_OUTPATIENT_CLINIC_OR_DEPARTMENT_OTHER): Payer: BLUE CROSS/BLUE SHIELD | Admitting: Hematology & Oncology

## 2016-07-13 VITALS — HR 97

## 2016-07-13 VITALS — BP 144/87 | HR 109 | Temp 98.0°F | Resp 16 | Ht 64.0 in | Wt 184.0 lb

## 2016-07-13 DIAGNOSIS — C3491 Malignant neoplasm of unspecified part of right bronchus or lung: Secondary | ICD-10-CM

## 2016-07-13 DIAGNOSIS — Z5111 Encounter for antineoplastic chemotherapy: Secondary | ICD-10-CM

## 2016-07-13 DIAGNOSIS — D500 Iron deficiency anemia secondary to blood loss (chronic): Secondary | ICD-10-CM | POA: Diagnosis not present

## 2016-07-13 DIAGNOSIS — K922 Gastrointestinal hemorrhage, unspecified: Secondary | ICD-10-CM | POA: Diagnosis not present

## 2016-07-13 DIAGNOSIS — C3492 Malignant neoplasm of unspecified part of left bronchus or lung: Secondary | ICD-10-CM

## 2016-07-13 DIAGNOSIS — Z9981 Dependence on supplemental oxygen: Secondary | ICD-10-CM | POA: Diagnosis not present

## 2016-07-13 DIAGNOSIS — C7951 Secondary malignant neoplasm of bone: Secondary | ICD-10-CM

## 2016-07-13 DIAGNOSIS — R600 Localized edema: Secondary | ICD-10-CM

## 2016-07-13 LAB — CBC WITH DIFFERENTIAL (CANCER CENTER ONLY)
BASO#: 0.1 10*3/uL (ref 0.0–0.2)
BASO%: 0.9 % (ref 0.0–2.0)
EOS%: 1.4 % (ref 0.0–7.0)
Eosinophils Absolute: 0.1 10*3/uL (ref 0.0–0.5)
HCT: 28.5 % — ABNORMAL LOW (ref 34.8–46.6)
HGB: 9.8 g/dL — ABNORMAL LOW (ref 11.6–15.9)
LYMPH#: 0.8 10*3/uL — AB (ref 0.9–3.3)
LYMPH%: 11.5 % — AB (ref 14.0–48.0)
MCH: 38.1 pg — ABNORMAL HIGH (ref 26.0–34.0)
MCHC: 34.4 g/dL (ref 32.0–36.0)
MCV: 111 fL — ABNORMAL HIGH (ref 81–101)
MONO#: 0.8 10*3/uL (ref 0.1–0.9)
MONO%: 12 % (ref 0.0–13.0)
NEUT#: 4.8 10*3/uL (ref 1.5–6.5)
NEUT%: 74.2 % (ref 39.6–80.0)
PLATELETS: 341 10*3/uL (ref 145–400)
RBC: 2.57 10*6/uL — ABNORMAL LOW (ref 3.70–5.32)
RDW: 14.8 % (ref 11.1–15.7)
WBC: 6.5 10*3/uL (ref 3.9–10.0)

## 2016-07-13 LAB — CMP (CANCER CENTER ONLY)
ALK PHOS: 80 U/L (ref 26–84)
ALT: 35 U/L (ref 10–47)
AST: 51 U/L — AB (ref 11–38)
Albumin: 4 g/dL (ref 3.3–5.5)
BILIRUBIN TOTAL: 0.6 mg/dL (ref 0.20–1.60)
BUN: 9 mg/dL (ref 7–22)
CALCIUM: 10 mg/dL (ref 8.0–10.3)
CO2: 29 meq/L (ref 18–33)
Chloride: 101 mEq/L (ref 98–108)
Creat: 1.3 mg/dl — ABNORMAL HIGH (ref 0.6–1.2)
GLUCOSE: 122 mg/dL — AB (ref 73–118)
Potassium: 3.3 mEq/L (ref 3.3–4.7)
SODIUM: 143 meq/L (ref 128–145)
Total Protein: 7.8 g/dL (ref 6.4–8.1)

## 2016-07-13 MED ORDER — SODIUM CHLORIDE 0.9% FLUSH
10.0000 mL | INTRAVENOUS | Status: DC | PRN
  Administered 2016-07-13: 10 mL
  Filled 2016-07-13: qty 10

## 2016-07-13 MED ORDER — PEMETREXED DISODIUM CHEMO INJECTION 500 MG
525.0000 mg/m2 | Freq: Once | INTRAVENOUS | Status: AC
  Administered 2016-07-13: 1000 mg via INTRAVENOUS
  Filled 2016-07-13: qty 20

## 2016-07-13 MED ORDER — CYANOCOBALAMIN 1000 MCG/ML IJ SOLN
INTRAMUSCULAR | Status: AC
  Filled 2016-07-13: qty 1

## 2016-07-13 MED ORDER — CYANOCOBALAMIN 1000 MCG/ML IJ SOLN
1000.0000 ug | Freq: Once | INTRAMUSCULAR | Status: AC
  Administered 2016-07-13: 1000 ug via INTRAMUSCULAR

## 2016-07-13 MED ORDER — HEPARIN SOD (PORK) LOCK FLUSH 100 UNIT/ML IV SOLN
500.0000 [IU] | Freq: Once | INTRAVENOUS | Status: AC | PRN
  Administered 2016-07-13: 500 [IU]
  Filled 2016-07-13: qty 5

## 2016-07-13 MED ORDER — SODIUM CHLORIDE 0.9 % IV SOLN
Freq: Once | INTRAVENOUS | Status: AC
  Administered 2016-07-13: 15:00:00 via INTRAVENOUS

## 2016-07-13 MED ORDER — PROCHLORPERAZINE MALEATE 10 MG PO TABS
10.0000 mg | ORAL_TABLET | Freq: Once | ORAL | Status: AC
  Administered 2016-07-13: 10 mg via ORAL

## 2016-07-13 MED ORDER — DENOSUMAB 120 MG/1.7ML ~~LOC~~ SOLN
120.0000 mg | Freq: Once | SUBCUTANEOUS | Status: AC
  Administered 2016-07-13: 120 mg via SUBCUTANEOUS
  Filled 2016-07-13: qty 1.7

## 2016-07-13 MED ORDER — TRIAMTERENE-HCTZ 75-50 MG PO TABS: 1.0000 | ORAL_TABLET | Freq: Every day | ORAL | 5 refills | Status: AC

## 2016-07-13 MED ORDER — PROCHLORPERAZINE MALEATE 10 MG PO TABS
ORAL_TABLET | ORAL | Status: AC
  Filled 2016-07-13: qty 1

## 2016-07-13 NOTE — Patient Instructions (Signed)

## 2016-07-13 NOTE — Progress Notes (Signed)
Hematology and Oncology Follow Up Visit  Carol Buchanan [id number] 10-29-1960 55 y.o. 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     Interim History:  Carol Buchanan here today for follow-up. She had her birthday on [birth year]. She had a good time. She really did much of nothing. She really enjoyed being able to relax and do what she wanted to do.  Even better news was that her PET scan did not show any active metastatic disease. As such, she has responded incredibly well to the Alimta.  She is still on oxygen.  She goes for her echocardiogram tomorrow. On the PET scan, there is some fluid around the heart. I'm not sure what this really means. I don't think she is symptomatic with it.  She's not noted any melena or bright red blood per rectum. This is been a ongoing problem for her. Her iron studies done a month ago showed a ferritin to be 153 with iron saturation of 31%.   She's had no problems with pain. She has had some increased swelling about the eyes. This might be from the Alimta. I will increase her Maxide dose.   Overall, her performance status is ECOG 1.   Medications:  Allergies as of 07/13/2016   No Known Allergies     Medication List       Accurate as of 07/13/16  3:07 PM. Always use your most recent med list.          calcium-vitamin D 500-200 MG-UNIT tablet Take 1 tablet by mouth daily.   CENTRUM SILVER PO Take 1 tablet by mouth daily.   chlorhexidine 0.12 % solution Commonly known as:  PERIDEX 15 mLs by Mouth Rinse route 2 (two) times daily.   DULoxetine 60 MG capsule Commonly known as:   CYMBALTA Take 60 mg by mouth daily. Reported on 08/06/2015   fluconazole 200 MG tablet Commonly known as:  DIFLUCAN Take 1 tablet (200 mg total) by mouth every 3 (three) days.   fluticasone 50 MCG/ACT nasal spray Commonly known as:  FLONASE Place 2 sprays into both nostrils daily.   folic acid 1 MG tablet Commonly known as:  FOLVITE Take 1 tablet (1 mg total) by mouth daily. Start 5-7 days before Alimta chemotherapy. Continue until 21 days after Alimta completed.   glipiZIDE 5 MG tablet Commonly known as:  GLUCOTROL Take 1 tablet (5 mg total) by mouth 2 (two) times daily before a meal.   ipratropium-albuterol 0.5-2.5 (3) MG/3ML Soln Commonly known as:  DUONEB Take 3 mLs by nebulization 3 (three) times daily.   lidocaine-prilocaine cream Commonly known as:  EMLA Apply to affected area once   LORazepam 0.5 MG tablet Commonly known as:  ATIVAN take 1 tablet by mouth every 6 hours if needed for nausea or vomiting   meloxicam 15 MG tablet Commonly known as:  MOBIC Take 15 mg by mouth every other day.   montelukast 10 MG tablet Commonly known as:  SINGULAIR take 1 tablet by mouth at bedtime   omeprazole 20 MG capsule Commonly known as:  PRILOSEC Take 1 capsule (20 mg total) by mouth daily.   ondansetron 8 MG  tablet Commonly known as:  ZOFRAN Take 8 mg by mouth 2 (two) times daily as needed for nausea or vomiting.   polyethylene glycol powder powder Commonly known as:  GLYCOLAX/MIRALAX take 17GM (DISSOLVED IN WATER) by mouth twice a day   prochlorperazine 10 MG tablet Commonly known as:  COMPAZINE take 1 tablet by mouth every 6 hours if needed for nausea or vomiting   simvastatin 20 MG tablet Commonly known as:  ZOCOR Take 20 mg by mouth daily.   Tiotropium Bromide-Olodaterol 2.5-2.5 MCG/ACT Aers Commonly known as:  STIOLTO RESPIMAT Inhale 2 puffs into the lungs daily.   triamterene-hydrochlorothiazide 75-50 MG tablet Commonly known as:  MAXZIDE Take 1 tablet  by mouth daily.   verapamil 180 MG (CO) 24 hr tablet Commonly known as:  COVERA HS Take 180 mg by mouth daily.   vitamin C 1000 MG tablet Take 1,000 mg by mouth daily.   vitamin E 400 UNIT capsule Take 400 Units by mouth daily.       Allergies: No Known Allergies  Past Medical History, Surgical history, Social history, and Family History were reviewed and updated.  Review of Systems: All other 10 point review of systems is negative.   Physical Exam:  vitals were not taken for this visit.  Wt Readings from Last 3 Encounters:  07/13/16 184 lb (83.5 kg)  06/22/16 185 lb 1.3 oz (84 kg)  06/01/16 190 lb (86.2 kg)    Cushingoid appearing white female in no obvious distress. Head and neck exam shows no ocular or oral lesions. She has no thrush. No adenopathy is noted in the neck. Lungs show decent breath sounds bilaterally.  She has occasional wheezes. Cardiac exam regular rate and rhythm with no murmurs, rubs or bruits. Abdomen is soft. She is slightly obese. She has good bowel sounds. She has no guarding or rebound tenderness. She has no palpable hepatosplenomegaly. Back exam shows no tenderness over the spine, ribs or hips. Extremities shows no edema in her legs. She has good range of motion of her joints. Skin exam shows no rashes, ecchymoses or petechia. Rectal exam shows no tears or fissures. She has no external hemorrhoids area and stool is brownish red and grossly heme positive. Neurological exam shows no focal neurological deficits.   Lab Results  Component Value Date   WBC 6.5 07/13/2016   HGB 9.8 (L) 07/13/2016   HCT 28.5 (L) 07/13/2016   MCV 111 (H) 07/13/2016   PLT 341 07/13/2016   Lab Results  Component Value Date   FERRITIN 153 06/22/2016   IRON 126 06/22/2016   TIBC 408 06/22/2016   UIBC 282 06/22/2016   IRONPCTSAT 31 06/22/2016   Lab Results  Component Value Date   RBC 2.57 (L) 07/13/2016   No results found for: KPAFRELGTCHN, LAMBDASER, KAPLAMBRATIO No  results found for: IGGSERUM, IGA, IGMSERUM No results found for: Ronnald Ramp, A1GS, A2GS, Tillman Sers, SPEI   Chemistry      Component Value Date/Time   NA 143 07/13/2016 1310   NA 135 (L) 11/13/2015 1131   K 3.3 07/13/2016 1310   K 4.4 11/13/2015 1131   CL 101 07/13/2016 1310   CO2 29 07/13/2016 1310   CO2 21 (L) 11/13/2015 1131   BUN 9 07/13/2016 1310   BUN 18.7 11/13/2015 1131   CREATININE 1.3 (H) 07/13/2016 1310   CREATININE 0.8 11/13/2015 1131      Component Value Date/Time   CALCIUM 10.0 07/13/2016 1310   CALCIUM 8.9  11/13/2015 1131   ALKPHOS 80 07/13/2016 1310   ALKPHOS 66 11/13/2015 1131   AST 51 (H) 07/13/2016 1310   AST 22 11/13/2015 1131   ALT 35 07/13/2016 1310   ALT 27 11/13/2015 1131   BILITOT 0.60 07/13/2016 1310   BILITOT 0.39 11/13/2015 1131     Impression and Plan: Ms. Suire is a 55 yo white female with metastatic poorly differentiated carcinoma of the left lung and wild-type for the typical genetic mutations.   It is so reassuring that the PET scan does not show any obvious disease. I'm just very impressed that she is done so well with the Alimta.  I do not see a need to do or add Avastin.  She will have the echocardiogram tomorrow. We will see what that shows about the pericardial effusion.  I just wishes she could come off the oxygen. I know that would make her life whole lot better.   We will plan to get her back in 3 weeks. She really has done remarkably well this year. She has been through some arch. She has persevered and with the help of her mom, she has prevailed so far.   We will see her back in 3 weeks.  Volanda Napoleon, MD 12/18/20173:07 PM

## 2016-07-13 NOTE — Patient Instructions (Signed)
[facility] Discharge Instructions for Patients Receiving Chemotherapy  Today you received the following chemotherapy agents; Alimta.   To help prevent nausea and vomiting after your treatment, we encourage you to take your nausea medication as directed.    If you develop nausea and vomiting that is not controlled by your nausea medication, call the clinic.   BELOW ARE SYMPTOMS THAT SHOULD BE REPORTED IMMEDIATELY:  *FEVER GREATER THAN 100.5 F  *CHILLS WITH OR WITHOUT FEVER  NAUSEA AND VOMITING THAT IS NOT CONTROLLED WITH YOUR NAUSEA MEDICATION  *UNUSUAL SHORTNESS OF BREATH  *UNUSUAL BRUISING OR BLEEDING  TENDERNESS IN MOUTH AND THROAT WITH OR WITHOUT PRESENCE OF ULCERS  *URINARY PROBLEMS  *BOWEL PROBLEMS  UNUSUAL RASH Items with * indicate a potential emergency and should be followed up as soon as possible.  Feel free to call the clinic you have any questions or concerns. The clinic phone number is [phone].  Please show the CHEMO ALERT CARD at check-in to the Emergency Department and triage nurse.   

## 2016-07-14 ENCOUNTER — Ambulatory Visit (HOSPITAL_COMMUNITY)
Admission: RE | Admit: 2016-07-14 | Discharge: 2016-07-14 | Disposition: A | Discharge status: Home / Self Care | Payer: BLUE CROSS/BLUE SHIELD | Source: Ambulatory Visit | Attending: Hematology & Oncology | Admitting: Hematology & Oncology

## 2016-07-14 DIAGNOSIS — I313 Pericardial effusion (noninflammatory): Secondary | ICD-10-CM | POA: Diagnosis not present

## 2016-07-14 DIAGNOSIS — I340 Nonrheumatic mitral (valve) insufficiency: Secondary | ICD-10-CM | POA: Insufficient documentation

## 2016-07-14 DIAGNOSIS — I110 Hypertensive heart disease with heart failure: Secondary | ICD-10-CM | POA: Insufficient documentation

## 2016-07-14 DIAGNOSIS — E119 Type 2 diabetes mellitus without complications: Secondary | ICD-10-CM | POA: Insufficient documentation

## 2016-07-14 DIAGNOSIS — I509 Heart failure, unspecified: Secondary | ICD-10-CM | POA: Diagnosis not present

## 2016-07-14 DIAGNOSIS — E785 Hyperlipidemia, unspecified: Secondary | ICD-10-CM | POA: Diagnosis not present

## 2016-07-14 DIAGNOSIS — I3139 Other pericardial effusion (noninflammatory): Secondary | ICD-10-CM

## 2016-07-14 DIAGNOSIS — I071 Rheumatic tricuspid insufficiency: Secondary | ICD-10-CM | POA: Diagnosis not present

## 2016-07-14 DIAGNOSIS — J449 Chronic obstructive pulmonary disease, unspecified: Secondary | ICD-10-CM | POA: Insufficient documentation

## 2016-07-14 LAB — IRON AND TIBC
%SAT: 23 % (ref 21–57)
IRON: 98 ug/dL (ref 41–142)
TIBC: 419 ug/dL (ref 236–444)
UIBC: 321 ug/dL (ref 120–384)

## 2016-07-14 LAB — FERRITIN: Ferritin: 118 ng/ml (ref 9–269)

## 2016-07-14 LAB — LACTATE DEHYDROGENASE: LDH: 294 U/L — AB (ref 125–245)

## 2016-07-14 NOTE — Progress Notes (Signed)
  Echocardiogram 2D Echocardiogram has been performed.  Jennette Dubin 07/14/2016, 10:49 AM

## 2016-07-16 ENCOUNTER — Telehealth: Payer: Self-pay | Admitting: *Deleted

## 2016-07-16 NOTE — Telephone Encounter (Signed)
-----   Message from Volanda Napoleon, MD sent at 07/15/2016  5:30 PM EST ----- I called and spoke with Ms. Scherzer. I told her about the fluid around the heart. I told her that we probably need to have this drained. I will have to speak with cardiology about this. I will speak with Dr. Liam Rogers. Hopefully, he will be over to see her this week. There is no cardiac temp and odd physiology by echocardiogram. As such, we have some time that we can "play with". I told her that I would make the phone call.  Laurey Arrow

## 2016-07-21 ENCOUNTER — Other Ambulatory Visit: Payer: Self-pay | Admitting: Hematology & Oncology

## 2016-07-21 DIAGNOSIS — J700 Acute pulmonary manifestations due to radiation: Secondary | ICD-10-CM

## 2016-07-21 DIAGNOSIS — R21 Rash and other nonspecific skin eruption: Secondary | ICD-10-CM

## 2016-07-21 DIAGNOSIS — C3492 Malignant neoplasm of unspecified part of left bronchus or lung: Secondary | ICD-10-CM

## 2016-07-25 ENCOUNTER — Other Ambulatory Visit: Payer: Self-pay | Admitting: Emergency Medicine

## 2016-07-30 ENCOUNTER — Ambulatory Visit: Payer: BLUE CROSS/BLUE SHIELD | Admitting: Emergency Medicine

## 2016-08-03 ENCOUNTER — Encounter: Payer: Self-pay | Admitting: Hematology & Oncology

## 2016-08-03 ENCOUNTER — Ambulatory Visit: Payer: BLUE CROSS/BLUE SHIELD

## 2016-08-03 ENCOUNTER — Ambulatory Visit (HOSPITAL_BASED_OUTPATIENT_CLINIC_OR_DEPARTMENT_OTHER): Payer: BLUE CROSS/BLUE SHIELD | Admitting: Hematology & Oncology

## 2016-08-03 ENCOUNTER — Other Ambulatory Visit (HOSPITAL_BASED_OUTPATIENT_CLINIC_OR_DEPARTMENT_OTHER): Payer: BLUE CROSS/BLUE SHIELD

## 2016-08-03 ENCOUNTER — Ambulatory Visit (HOSPITAL_BASED_OUTPATIENT_CLINIC_OR_DEPARTMENT_OTHER): Payer: BLUE CROSS/BLUE SHIELD

## 2016-08-03 VITALS — BP 149/75 | HR 113 | Temp 98.2°F | Resp 22 | Ht 64.0 in

## 2016-08-03 DIAGNOSIS — C3490 Malignant neoplasm of unspecified part of unspecified bronchus or lung: Secondary | ICD-10-CM

## 2016-08-03 DIAGNOSIS — C3491 Malignant neoplasm of unspecified part of right bronchus or lung: Secondary | ICD-10-CM

## 2016-08-03 DIAGNOSIS — K922 Gastrointestinal hemorrhage, unspecified: Secondary | ICD-10-CM | POA: Diagnosis not present

## 2016-08-03 DIAGNOSIS — Z5111 Encounter for antineoplastic chemotherapy: Secondary | ICD-10-CM | POA: Diagnosis not present

## 2016-08-03 DIAGNOSIS — R600 Localized edema: Secondary | ICD-10-CM

## 2016-08-03 LAB — CMP (CANCER CENTER ONLY)
ALBUMIN: 3.9 g/dL (ref 3.3–5.5)
ALT(SGPT): 36 U/L (ref 10–47)
AST: 37 U/L (ref 11–38)
Alkaline Phosphatase: 81 U/L (ref 26–84)
BUN, Bld: 22 mg/dL (ref 7–22)
CALCIUM: 10.1 mg/dL (ref 8.0–10.3)
CO2: 29 meq/L (ref 18–33)
Chloride: 93 mEq/L — ABNORMAL LOW (ref 98–108)
Creat: 1.5 mg/dl — ABNORMAL HIGH (ref 0.6–1.2)
Glucose, Bld: 98 mg/dL (ref 73–118)
POTASSIUM: 3.1 meq/L — AB (ref 3.3–4.7)
SODIUM: 138 meq/L (ref 128–145)
Total Bilirubin: 0.8 mg/dl (ref 0.20–1.60)
Total Protein: 7.5 g/dL (ref 6.4–8.1)

## 2016-08-03 LAB — CBC WITH DIFFERENTIAL (CANCER CENTER ONLY)
BASO#: 0 10*3/uL (ref 0.0–0.2)
BASO%: 0.5 % (ref 0.0–2.0)
EOS ABS: 0.1 10*3/uL (ref 0.0–0.5)
EOS%: 0.6 % (ref 0.0–7.0)
HEMATOCRIT: 29.5 % — AB (ref 34.8–46.6)
HEMOGLOBIN: 10.3 g/dL — AB (ref 11.6–15.9)
LYMPH#: 1 10*3/uL (ref 0.9–3.3)
LYMPH%: 12.1 % — ABNORMAL LOW (ref 14.0–48.0)
MCH: 38 pg — AB (ref 26.0–34.0)
MCHC: 34.9 g/dL (ref 32.0–36.0)
MCV: 109 fL — ABNORMAL HIGH (ref 81–101)
MONO#: 1.1 10*3/uL — AB (ref 0.1–0.9)
MONO%: 14.1 % — AB (ref 0.0–13.0)
NEUT%: 72.7 % (ref 39.6–80.0)
NEUTROS ABS: 5.9 10*3/uL (ref 1.5–6.5)
Platelets: 297 10*3/uL (ref 145–400)
RBC: 2.71 10*6/uL — ABNORMAL LOW (ref 3.70–5.32)
RDW: 13.9 % (ref 11.1–15.7)
WBC: 8.1 10*3/uL (ref 3.9–10.0)

## 2016-08-03 MED ORDER — HEPARIN SOD (PORK) LOCK FLUSH 100 UNIT/ML IV SOLN
500.0000 [IU] | Freq: Once | INTRAVENOUS | Status: AC | PRN
Start: 2016-08-03 — End: 2016-08-03
  Administered 2016-08-03: 500 [IU]
  Filled 2016-08-03: qty 5

## 2016-08-03 MED ORDER — SODIUM CHLORIDE 0.9% FLUSH
10.0000 mL | INTRAVENOUS | Status: DC | PRN
  Administered 2016-08-03: 10 mL
  Filled 2016-08-03: qty 10

## 2016-08-03 MED ORDER — SODIUM CHLORIDE 0.9 % IV SOLN
Freq: Once | INTRAVENOUS | Status: AC
  Administered 2016-08-03: 15:00:00 via INTRAVENOUS

## 2016-08-03 MED ORDER — PROCHLORPERAZINE MALEATE 10 MG PO TABS
10.0000 mg | ORAL_TABLET | Freq: Once | ORAL | Status: AC
  Administered 2016-08-03: 10 mg via ORAL

## 2016-08-03 MED ORDER — PROCHLORPERAZINE MALEATE 10 MG PO TABS
ORAL_TABLET | ORAL | Status: AC
  Filled 2016-08-03: qty 1

## 2016-08-03 MED ORDER — PEMETREXED DISODIUM CHEMO INJECTION 500 MG
1000.0000 mg | Freq: Once | INTRAVENOUS | Status: AC
  Administered 2016-08-03: 1000 mg via INTRAVENOUS
  Filled 2016-08-03: qty 40

## 2016-08-03 NOTE — Progress Notes (Signed)
Hematology and Oncology Follow Up Visit  GALIT URICH [id number] Dec 30, 1960 56 y.o. 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     Interim History:  Ms. DSolowayis here today for follow-up. She enjoyed Christmas and New Year's holiday. She and her mom stayed close to home.  She is not wearing oxygen today. Over, this is a good sign. She really wants to get rid of the oxygen. She sees the pulmonologist later on this week. I told her that I thought it would be a good idea to wear the oxygen when she sleeps at night time.  She is not having pain. She's not having any bleeding. This I think is very important. She's had problems with GI bleeding in the past.  She still has some edema around her eyes. She is on Maxide. She is not drinking all that much fluid. She says leg is cold outside, she does not like to drink a lot of fluid.  She does have the pericardial effusion. I think she sees the cardiologist tomorrow for an evaluation. I don't think she is symptomatic with the effusion.  Her appetite has been pretty good. She's had no nausea or vomiting. She has had some constipation. She is always on a laxative.  Overall, her performance status is ECOG 1.   Medications:  Allergies as of 08/03/2016   No Known Allergies     Medication List       Accurate as of 08/03/16  2:46 PM. Always use your most recent med list.          calcium-vitamin D 500-200 MG-UNIT tablet Take 1 tablet by mouth daily.   CENTRUM SILVER PO Take 1 tablet by mouth daily.   chlorhexidine 0.12 % solution Commonly known as:  PERIDEX 15 mLs by Mouth Rinse  route 2 (two) times daily.   DULoxetine 60 MG capsule Commonly known as:  CYMBALTA Take 60 mg by mouth daily. Reported on 08/06/2015   fluconazole 200 MG tablet Commonly known as:  DIFLUCAN Take 1 tablet (200 mg total) by mouth every 3 (three) days.   fluticasone 50 MCG/ACT nasal spray Commonly known as:  FLONASE Place 2 sprays into both nostrils daily.   folic acid 1 MG tablet Commonly known as:  FOLVITE Take 1 tablet (1 mg total) by mouth daily. Start 5-7 days before Alimta chemotherapy. Continue until 21 days after Alimta completed.   glipiZIDE 5 MG tablet Commonly known as:  GLUCOTROL Take 1 tablet (5 mg total) by mouth 2 (two) times daily before a meal.   ipratropium-albuterol 0.5-2.5 (3) MG/3ML Soln Commonly known as:  DUONEB Take 3 mLs by nebulization 3 (three) times daily.   lidocaine-prilocaine cream Commonly known as:  EMLA Apply to affected area once   LORazepam 0.5 MG tablet Commonly known as:  ATIVAN take 1 tablet by mouth every 6 hours if needed for nausea or vomiting   meloxicam 15 MG tablet Commonly known as:  MOBIC Take 15 mg by mouth every other day.   montelukast 10 MG tablet Commonly known as:  SINGULAIR take 1 tablet by mouth at bedtime   omeprazole 20 MG capsule Commonly known as:  PRILOSEC Take 1 capsule (20 mg total) by mouth daily.   ondansetron 4 MG tablet Commonly known as:  ZOFRAN take 1 tablet by mouth once daily   polyethylene glycol powder powder Commonly known as:  GLYCOLAX/MIRALAX take 17GM (DISSOLVED IN WATER) by mouth twice a day   prochlorperazine 10 MG tablet Commonly known as:  COMPAZINE take 1 tablet by mouth every 6 hours if needed for nausea or vomiting   simvastatin 20 MG tablet Commonly known as:  ZOCOR Take 20 mg by mouth daily.   STIOLTO RESPIMAT 2.5-2.5 MCG/ACT Aers Generic drug:  Tiotropium Bromide-Olodaterol inhale 2 puffs INTO THE LUNGS DAILY   triamterene-hydrochlorothiazide 75-50 MG tablet Commonly  known as:  MAXZIDE Take 1 tablet by mouth daily.   verapamil 180 MG (CO) 24 hr tablet Commonly known as:  COVERA HS Take 180 mg by mouth daily.   vitamin C 1000 MG tablet Take 1,000 mg by mouth daily.   vitamin E 400 UNIT capsule Take 400 Units by mouth daily.       Allergies: No Known Allergies  Past Medical History, Surgical history, Social history, and Family History were reviewed and updated.  Review of Systems: All other 10 point review of systems is negative.   Physical Exam:  height is _0  (1.626 m). Her oral temperature is 98.2 F (36.8 C). Her blood pressure is 149/75 (abnormal) and her pulse is 113 (abnormal). Her respiration is 22 (abnormal) and oxygen saturation is 92%.   Wt Readings from Last 3 Encounters:  07/13/16 184 lb (83.5 kg)  06/22/16 185 lb 1.3 oz (84 kg)  06/01/16 190 lb (86.2 kg)    Cushingoid appearing white female in no obvious distress. Head and neck exam shows no ocular or oral lesions. She has no thrush. No adenopathy is noted in the neck. Lungs show decent breath sounds bilaterally.  She has occasional wheezes. Cardiac exam regular rate and rhythm with no murmurs, rubs or bruits. Abdomen is soft. She is slightly obese. She has good bowel sounds. She has no guarding or rebound tenderness. She has no palpable hepatosplenomegaly. Back exam shows no tenderness over the spine, ribs or hips. Extremities shows no edema in her legs. She has good range of motion of her joints. Skin exam shows no rashes, ecchymoses or petechia. Rectal exam shows no tears or fissures. She has no external hemorrhoids area and stool is brownish red and grossly heme positive. Neurological exam shows no focal neurological deficits.   Lab Results  Component Value Date   WBC 8.1 08/03/2016   HGB 10.3 (L) 08/03/2016   HCT 29.5 (L) 08/03/2016   MCV 109 (H) 08/03/2016   PLT 297 08/03/2016   Lab Results  Component Value Date   FERRITIN 118 07/13/2016   IRON 98 07/13/2016    TIBC 419 07/13/2016   UIBC 321 07/13/2016   IRONPCTSAT 23 07/13/2016   Lab Results  Component Value Date   RBC 2.71 (L) 08/03/2016   No results found for: KPAFRELGTCHN, LAMBDASER, KAPLAMBRATIO No results found for: IGGSERUM, IGA, IGMSERUM No results found for: TOTALPROTELP, ALBUMINELP, A1GS, A2GS, BETS, BETA2SER, GAMS, MSPIKE, SPEI   Chemistry      Component Value Date/Time   NA 138 08/03/2016 1340   NA 135 (L) 11/13/2015 1131   K 3.1 (L) 08/03/2016 1340   K 4.4 11/13/2015 1131   CL 93 (L) 08/03/2016 1340   CO2 29 08/03/2016 1340   CO2 21 (L) 11/13/2015 1131   BUN 22 08/03/2016  1340   BUN 18.7 11/13/2015 1131   CREATININE 1.5 (H) 08/03/2016 1340   CREATININE 0.8 11/13/2015 1131      Component Value Date/Time   CALCIUM 10.1 08/03/2016 1340   CALCIUM 8.9 11/13/2015 1131   ALKPHOS 81 08/03/2016 1340   ALKPHOS 66 11/13/2015 1131   AST 37 08/03/2016 1340   AST 22 11/13/2015 1131   ALT 36 08/03/2016 1340   ALT 27 11/13/2015 1131   BILITOT 0.80 08/03/2016 1340   BILITOT 0.39 11/13/2015 1131     Impression and Plan: Ms. Hendrix is a 56 yo white female with metastatic poorly differentiated carcinoma of the left lung and wild-type for the typical genetic mutations.   I and happy that she is off the oxygen during the daytime. I told her to make sure to wear oxygen at night. I think this would help her out quite a lot.  I don't think we have to do another PET scan on her probably for another 3 months. I think that as long as she is doing okay and feeling okay and nothing changes on her physical exam or with her labs, I'm not sure what a PET scan sooner than that would do for [place].   I think that if she does have issues with respect to progression, I can always add Avastin.  I would be a little bit hesitant to retry her on immunotherapy. She had the pneumonitis from pembrolizumab.   I spent about 25 minutes with her. We talked about how we are going to proceed in the future. She  sees the cardiologist tomorrow about the pericardial effusion. I don't think that this is clinically relevant right now.   Volanda Napoleon, MD 1/8/20182:46 PM

## 2016-08-03 NOTE — Patient Instructions (Signed)
[facility] Discharge Instructions for Patients Receiving Chemotherapy  Today you received the following chemotherapy agents; Alimta.   To help prevent nausea and vomiting after your treatment, we encourage you to take your nausea medication as directed.    If you develop nausea and vomiting that is not controlled by your nausea medication, call the clinic.   BELOW ARE SYMPTOMS THAT SHOULD BE REPORTED IMMEDIATELY:  *FEVER GREATER THAN 100.5 F  *CHILLS WITH OR WITHOUT FEVER  NAUSEA AND VOMITING THAT IS NOT CONTROLLED WITH YOUR NAUSEA MEDICATION  *UNUSUAL SHORTNESS OF BREATH  *UNUSUAL BRUISING OR BLEEDING  TENDERNESS IN MOUTH AND THROAT WITH OR WITHOUT PRESENCE OF ULCERS  *URINARY PROBLEMS  *BOWEL PROBLEMS  UNUSUAL RASH Items with * indicate a potential emergency and should be followed up as soon as possible.  Feel free to call the clinic you have any questions or concerns. The clinic phone number is [phone].  Please show the CHEMO ALERT CARD at check-in to the Emergency Department and triage nurse.   

## 2016-08-03 NOTE — Patient Instructions (Signed)

## 2016-08-04 ENCOUNTER — Encounter: Payer: Self-pay | Admitting: Cardiovascular Disease

## 2016-08-04 ENCOUNTER — Encounter: Payer: Self-pay | Admitting: Emergency Medicine

## 2016-08-04 ENCOUNTER — Ambulatory Visit (INDEPENDENT_AMBULATORY_CARE_PROVIDER_SITE_OTHER): Payer: BLUE CROSS/BLUE SHIELD | Admitting: Cardiovascular Disease

## 2016-08-04 ENCOUNTER — Ambulatory Visit (INDEPENDENT_AMBULATORY_CARE_PROVIDER_SITE_OTHER): Payer: BLUE CROSS/BLUE SHIELD | Admitting: Emergency Medicine

## 2016-08-04 VITALS — BP 130/90 | HR 108 | Ht 64.0 in | Wt 184.8 lb

## 2016-08-04 DIAGNOSIS — I3139 Other pericardial effusion (noninflammatory): Secondary | ICD-10-CM | POA: Insufficient documentation

## 2016-08-04 DIAGNOSIS — J9611 Chronic respiratory failure with hypoxia: Secondary | ICD-10-CM | POA: Diagnosis not present

## 2016-08-04 DIAGNOSIS — J449 Chronic obstructive pulmonary disease, unspecified: Secondary | ICD-10-CM

## 2016-08-04 DIAGNOSIS — C3490 Malignant neoplasm of unspecified part of unspecified bronchus or lung: Secondary | ICD-10-CM | POA: Diagnosis not present

## 2016-08-04 DIAGNOSIS — I313 Pericardial effusion (noninflammatory): Secondary | ICD-10-CM | POA: Diagnosis not present

## 2016-08-04 LAB — IRON AND TIBC
%SAT: 27 % (ref 21–57)
IRON: 116 ug/dL (ref 41–142)
TIBC: 428 ug/dL (ref 236–444)
UIBC: 311 ug/dL (ref 120–384)

## 2016-08-04 LAB — FERRITIN: FERRITIN: 147 ng/mL (ref 9–269)

## 2016-08-04 NOTE — Assessment & Plan Note (Signed)
Discussed oxygen therapy with her today. She is interested in minimizing her use. We discussed the importance of keeping her saturations greater than 90%. She will permit herself to remain on room air as long as she does not desaturate past this point. She knows that she will likely need to use it with exertion. She will continue While sleeping.

## 2016-08-04 NOTE — Assessment & Plan Note (Signed)
Appears to be stable at this time. No evidence of an exacerbation. Minimal cough. She tolerates Stiolto well. Minimal albuterol use.  Please continue your Stiolto as you are using it.

## 2016-08-04 NOTE — Patient Instructions (Signed)
Please continue your Stiolto as you are using it.  Follow up with Dr Katheran Awe and get Ct chest per his schedule.  Follow with cardiology regarding your pericardial fluid collection  Take albuterol 2 puffs up to every 4 hours if needed for shortness of breath.  Continue your oxygen, titrated to keep your SpO2 > 90% Follow with Dr Lamonte Sakai in April 2018

## 2016-08-04 NOTE — Patient Instructions (Signed)
Medication Instructions:  Your physician recommends that you continue on your current medications as directed. Please refer to the Current Medication list given to you today.   Labwork: None Ordered   Testing/Procedures: Your physician has requested that you have an echocardiogram. Echocardiography is a painless test that uses sound waves to create images of your heart. It provides your doctor with information about the size and shape of your heart and how well your heart's chambers and valves are working. This procedure takes approximately one hour. There are no restrictions for this procedure.   Follow-Up: Your physician wants you to follow-up in: 3 months with Dr. Acie Fredrickson.  You will receive a reminder letter in the mail two months in advance. If you don't receive a letter, please call our office to schedule the follow-up appointment.   If you need a refill on your cardiac medications before your next appointment, please call your pharmacy.   Thank you for choosing [facility]! Christen Bame, RN [phone]

## 2016-08-04 NOTE — Assessment & Plan Note (Signed)
Follow up with Dr Katheran Awe and get Ct chest per his schedule.

## 2016-08-04 NOTE — Progress Notes (Signed)
Subjective:    Patient ID: Carol Buchanan, female    DOB: [birth year], 56 y.o.   MRN: [id number]  HPI 56 yo former smoker, hx of HTN, hyperlipidemia, allergies. Followed for COPD and stage IV adenoCA lung.    ROV 08/29/15 -- follow-up visit for history of COPD, tobacco use and new diagnosis of non-small cell lung cancer involving the left upper lobe. She underwent bronchoscopy on 07/01/15 that confirmed poorly differentiated adenocarcinoma. She's been seen by Dr Katheran Awe, has completed a course of XRT. She is scheduled to start immunotherapy soon once approved by insurance. Her L sided chest pain is better with XRT.  She feels that she may be more SOB since last time. The Anoro has started making her cough. She is having nasal congestion with blood secretions, no fever. Her MRI 12/18 showed some paranasal thickening.   ROV 10/01/15 -- follow-up visit for history of non-small cell lung cancer in the left upper lobe, COPD, chronic cough and seasonal allergies. Last month we tried changing her Anoro to Stiolto to see if this formulation would be better with regard to her cough. She has been on immunotherapy under the direction of Dr Katheran Awe.   ROV 01/22/16 -- she follows up for history of COPD, chronic cough, frequent exacerbations. She also has a history of stage IV non-small cell lung cancer in the left upper lobe. She has been on immunotherapy, last dose was given in May. She was hospitalized for pneumonitis. Then she was unfortunately hospitalized in early June for GI bleeding > hemorrhoids. She is now getting standard chemotherapy, has had first treatment.   ROV 08/04/16 -- patient follows up today for her history of COPD, stage IV non-small cell lung cancer in the left upper lobe for which she has been treated with immunotherapy, then with carboplatin/Alimta. She is doing fairly well, although she notes that her exertional tolerance is very low. She is coughing minimally. Doing well on Stiolto. No  flares.    PULMONARY FUNCTON TEST 12/09/2012  FVC 3.37  FEV1 2.65  FEV1/FVC 78.6  FVC  % Predicted 73  FEV % Predicted 71  FeF 25-75 3.27  FeF 25-75 % Predicted 2.46    Review of Systems  Constitutional: Negative for fever and unexpected weight change.  HENT: Negative for congestion, dental problem, ear pain, nosebleeds, postnasal drip, rhinorrhea, sinus pressure, sneezing, sore throat and trouble swallowing.   Eyes: Negative for redness and itching.  Respiratory: Positive for shortness of breath. Negative for cough, chest tightness and wheezing.   Cardiovascular: Negative for palpitations and leg swelling.  Gastrointestinal: Negative for nausea and vomiting.  Genitourinary: Negative for dysuria.  Musculoskeletal: Negative for joint swelling.  Skin: Negative for rash.  Neurological: Negative for headaches.  Hematological: Does not bruise/bleed easily.  Psychiatric/Behavioral: Negative for dysphoric mood. The patient is not nervous/anxious.         Objective:   Physical Exam Vitals:   08/04/16 1542 08/04/16 1543  BP:  124/78  BP Location:  Right Arm  Cuff Size:  Normal  Pulse:  (!) 116  SpO2:  97%  Weight: 184 lb (83.5 kg)   Height: '5\' 4"'$  (1.626 m)    Gen: Pleasant, well-nourished, in no distress,  normal affect  ENT: No lesions,  mouth clear,  oropharynx clear, no postnasal drip  Neck: No JVD, no TMG, no carotid bruits  Lungs: No use of accessory muscles, few exp wheezes on a forced expiration.   Cardiovascular: RRR, heart sounds normal, no murmur  or gallops, no peripheral edema  Musculoskeletal: No deformities, no cyanosis or clubbing  Neuro: alert, non focal  Skin: Warm, no lesions or rashes    CT chest 06/21/15 --  COMPARISON: Chest radiograph 06/21/2015  FINDINGS: Collapse and consolidation of the left upper lung and left lingula. Bronchial cut off sign with low-attenuation in the left hilum and probable enlarged left hilar lymph node measuring  about 2.2 cm diameter. Additional enlarged lymph nodes in the aortopulmonic window region, anterior mediastinum, left paratracheal, pretracheal, and subcarinal regions. Findings likely due to obstructing malignancy with metastatic mediastinal lymph nodes. Bronchoscopy may be useful in further evaluation if clinically indicated.  Atelectasis in the lung bases. No pleural effusions. No pneumothorax.  Normal heart size. Normal caliber thoracic aorta. Aberrant right subclavian artery. Esophagus is decompressed.  Included portions of the upper abdominal organs demonstrate diffuse fatty infiltration of the liver. No adrenal gland nodules are demonstrated although the left adrenal gland is not entirely included within the field of view. Degenerative changes in the spine. No destructive bone lesions appreciated.  IMPRESSION: Left upper lung collapse with bronchial cut off sign and enlarged left hilar and mediastinal lymph nodes consistent with obstructing malignancy with lymph node metastasis.      Assessment & Plan:  COPD (chronic obstructive pulmonary disease) ([place]) Appears to be stable at this time. No evidence of an exacerbation. Minimal cough. She tolerates Stiolto well. Minimal albuterol use.  Please continue your Stiolto as you are using it.    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   Chronic respiratory failure (HCC) Discussed oxygen therapy with her today. She is interested in minimizing her use. We discussed the importance of keeping her saturations greater than 90%. She will permit herself to remain on room air as long as she does not desaturate past this point. She knows that she will likely need to use it with exertion. She will continue While sleeping.  Baltazar Apo, MD, PhD 08/04/2016, 4:13 PM [facility] [phone] or if no answer [phone]

## 2016-08-04 NOTE — Progress Notes (Signed)
Cardiology Office Note   Date:  08/04/2016   ID:  Carol Buchanan, DOB 05-15-1961, MRN [id number]  PCP:  Andria Frames, MD  Cardiologist:   Mertie Moores, MD   Chief Complaint  Patient presents with  . Shortness of Breath   Problem List 1. Metastatic Lung Cancer 2. Moderate pericardial effusion 3. COPD 4. Essential HTN 5. Hyperlipidemia    History of Present Illness: Carol Buchanan is a 56 y.o. female who presents for evaluation of a pericardial effusion. She hs not had any worsening dyspnea . Echocardiogram in May showed a trivial pericardial effusion. Repeat echocardiogram performed in December showed a moderate pericardial effusion.  She is chronically short of breath. She has COPD. She is now on home oxygen. She thinks that she is breathing a little bit better now compared to mid December. She admits that she does not get a lot of exercise.   Past Medical History:  Diagnosis Date  . COPD (chronic obstructive pulmonary disease) ([place])   . DM (diabetes mellitus), type 2, uncontrolled ([place]) 11/26/2015  . Hyperlipidemia   . Hypertension   . Neuropathy ([place])   . 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 Sinus trouble     Past Surgical History:  Procedure Laterality Date  . BACK SURGERY  2015  . COLONOSCOPY Left 12/28/2015   Procedure: COLONOSCOPY;  Surgeon: Wilford Corner, MD;  Location: [facility] ENDOS[facility];  Service: Endoscopy;  Laterality: Left;  . ESOPHAGOGASTRODUODENOSCOPY N/A 12/28/2015   Procedure: ESOPHAGOGASTRODUODENOSCOPY (EGD);  Surgeon: Wilford Corner, MD;  Location: [facility];  Service: Endoscopy;  Laterality: N/A;  . TUBAL LIGATION  1994  . VIDEO BRONCHOSCOPY Bilateral 07/01/2015   Procedure: VIDEO BRONCHOSCOPY WITHOUT FLUORO;  Surgeon: Collene Gobble, MD;  Location: [place];  Service: Cardiopulmonary;   Laterality: Bilateral;     Current Outpatient Prescriptions  Medication Sig Dispense Refill  . Ascorbic Acid (VITAMIN C) 1000 MG tablet Take 1,000 mg by mouth daily.    . Calcium Carbonate-Vitamin D (CALCIUM-VITAMIN D) 500-200 MG-UNIT tablet Take 1 tablet by mouth daily.    . DULoxetine (CYMBALTA) 60 MG capsule Take 60 mg by mouth daily. Reported on 08/06/2015  0  . fluticasone (FLONASE) 50 MCG/ACT nasal spray Place 2 sprays into both nostrils daily. 16 g 5  . folic acid (FOLVITE) 1 MG tablet Take 1 tablet (1 mg total) by mouth daily. Start 5-7 days before Alimta chemotherapy. Continue until 21 days after Alimta completed. 100 tablet 3  . glipiZIDE (GLUCOTROL) 5 MG tablet Take 1 tablet (5 mg total) by mouth 2 (two) times daily before a meal. 60 tablet 0  . ipratropium-albuterol (DUONEB) 0.5-2.5 (3) MG/3ML SOLN Take 3 mLs by nebulization 3 (three) times daily. (Patient taking differently: Take 3 mLs by nebulization daily. ) 360 mL 2  . lidocaine-prilocaine (EMLA) cream Apply to affected area once 30 g 3  . LORazepam (ATIVAN) 0.5 MG tablet take 1 tablet by mouth every 6 hours if needed for nausea or vomiting 30 tablet 3  . meloxicam (MOBIC) 15 MG tablet Take 15 mg by mouth every other day.    . montelukast (SINGULAIR) 10 MG tablet take 1 tablet by mouth at bedtime 30 tablet 1  . omeprazole (PRILOSEC) 20 MG capsule Take 1 capsule (20 mg total) by mouth daily. 60 capsule 0  . ondansetron (ZOFRAN) 4  MG tablet take 1 tablet by mouth once daily 30 tablet 2  . polyethylene glycol powder (GLYCOLAX/MIRALAX) powder take 17GM (DISSOLVED IN WATER) by mouth twice a day 500 g 0  . prochlorperazine (COMPAZINE) 10 MG tablet take 1 tablet by mouth every 6 hours if needed for nausea or vomiting 30 tablet 1  . simvastatin (ZOCOR) 20 MG tablet Take 20 mg by mouth daily.     Marland Kitchen STIOLTO RESPIMAT 2.5-2.5 MCG/ACT AERS inhale 2 puffs INTO THE LUNGS DAILY 4 g 5  . triamterene-hydrochlorothiazide (MAXZIDE) 75-50 MG tablet  Take 1 tablet by mouth daily. 30 tablet 5  . verapamil (COVERA HS) 180 MG (CO) 24 hr tablet Take 180 mg by mouth daily.      No current facility-administered medications for this visit.     Allergies:   Patient has no known allergies.    Social History:  The patient  reports that she quit smoking about 5 years ago. Her smoking use included Cigarettes. She has a 52.50 pack-year smoking history. She has never used smokeless tobacco. She reports that she drinks about 1.2 oz of alcohol per week . She reports that she does not use drugs.   Family History:  The patient's family history includes Arthritis in her mother; Heart attack in her father; Heart disease in her father.    ROS:  Please see the history of present illness.    Review of Systems: Constitutional:  denies fever, chills, diaphoresis, appetite change and fatigue.  HEENT: denies photophobia, eye pain, redness, hearing loss, ear pain, congestion, sore throat, rhinorrhea, sneezing, neck pain, neck stiffness and tinnitus.  Respiratory: denies SOB, DOE, cough, chest tightness, and wheezing.  Cardiovascular: denies chest pain, palpitations and leg swelling.  Gastrointestinal: denies nausea, vomiting, abdominal pain, diarrhea, constipation, blood in stool.  Genitourinary: denies dysuria, urgency, frequency, hematuria, flank pain and difficulty urinating.  Musculoskeletal: denies  myalgias, back pain, joint swelling, arthralgias and gait problem.   Skin: denies pallor, rash and wound.  Neurological: denies dizziness, seizures, syncope, weakness, light-headedness, numbness and headaches.   Hematological: denies adenopathy, easy bruising, personal or family bleeding history.  Psychiatric/ Behavioral: denies suicidal ideation, mood changes, confusion, nervousness, sleep disturbance and agitation.       All other systems are reviewed and negative.    PHYSICAL EXAM: VS:  BP 130/90 (BP Location: Left Arm, Patient Position: Sitting,  Cuff Size: Normal)   Pulse (!) 108   Ht '5\' 4"'$  (1.626 m)   Wt 184 lb 12.8 oz (83.8 kg)   SpO2 98% Comment: WITH O2  BMI 31.72 kg/m  , BMI Body mass index is 31.72 kg/m.   BP by me is 130 / 80 Pulsus parodox of 8-10  Negative Kussmauls sign   GEN: Well nourished, well developed, in no acute distress  HEENT: normal  Neck: no JVD, carotid bruits, or masses Cardiac: RRR; no murmurs, rubs, or gallops,no edema  Respiratory:  clear to auscultation bilaterally, normal work of breathing GI: soft, nontender, nondistended, + BS MS: no deformity or atrophy  Skin: warm and dry, no rash Neuro:  Strength and sensation are intact Psych: normal   EKG:  EKG is ordered today. The ekg ordered today demonstrates  sinus tachycardia 108 bpm.   Recent Labs: 11/26/2015: TSH 0.857 12/01/2015: Magnesium 2.6 08/03/2016: ALT(SGPT) 36; BUN, Bld 22; Creat 1.5; HGB 10.3; Platelets 297; Potassium 3.1; Sodium 138    Lipid Panel No results found for: CHOL, TRIG, HDL, CHOLHDL, VLDL, LDLCALC, LDLDIRECT  Wt Readings from Last 3 Encounters:  08/04/16 184 lb 12.8 oz (83.8 kg)  07/13/16 184 lb (83.5 kg)  06/22/16 185 lb 1.3 oz (84 kg)      Other studies Reviewed: Additional studies/ records that were reviewed today include: . Review of the above records demonstrates:    ASSESSMENT AND PLAN:  1.  Pericardial effusion: Carol Buchanan presents today for further evaluation of a pericardial effusion. She's not had any real signs or symptoms of pericardial tap and on but the effusion was incidentally noted during an echocardiogram.  She has a pulsus paradoxes between 8 and 10 today. She does not have a Kussmauls sign. She is slightly tachycardic but this could also be due to her COPD.  Her blood pressure is normal.  I would like to repeat her echocardiogram today. If the pericardial effusion is larger then I think that we should go ahead and refer her to the thoracic surgeons for placement of a window. If the  pericardial effusion is smaller than I think that we can wait and repeat the echocardiogram in 3-6 months.  Clinically she seems to be feeling a little bit better compared to one month ago. I've advised her to attention to how she's feeling and to call us right away if she starts having more shortness of breath and more limitations.   Current medicines are reviewed at length with the patient today.  The patient does not have concerns regarding medicines.  Labs/ tests ordered today include:  No orders of the defined types were placed in this encounter.    Disposition:   FU with me in 3 months     Mertie Moores, MD  08/04/2016 11:52 AM    [address], [place], [place]  [address] Phone: [phone]; Fax: [phone]

## 2016-08-05 ENCOUNTER — Other Ambulatory Visit: Payer: Self-pay | Admitting: Family

## 2016-08-05 DIAGNOSIS — C3492 Malignant neoplasm of unspecified part of left bronchus or lung: Secondary | ICD-10-CM

## 2016-08-05 DIAGNOSIS — K5900 Constipation, unspecified: Secondary | ICD-10-CM

## 2016-08-18 ENCOUNTER — Other Ambulatory Visit: Payer: Self-pay

## 2016-08-18 ENCOUNTER — Ambulatory Visit (HOSPITAL_COMMUNITY): Payer: BLUE CROSS/BLUE SHIELD | Attending: Cardiovascular Disease

## 2016-08-18 DIAGNOSIS — I071 Rheumatic tricuspid insufficiency: Secondary | ICD-10-CM | POA: Diagnosis not present

## 2016-08-18 DIAGNOSIS — I3139 Other pericardial effusion (noninflammatory): Secondary | ICD-10-CM

## 2016-08-18 DIAGNOSIS — E785 Hyperlipidemia, unspecified: Secondary | ICD-10-CM | POA: Insufficient documentation

## 2016-08-18 DIAGNOSIS — J449 Chronic obstructive pulmonary disease, unspecified: Secondary | ICD-10-CM | POA: Diagnosis not present

## 2016-08-18 DIAGNOSIS — I10 Essential (primary) hypertension: Secondary | ICD-10-CM | POA: Insufficient documentation

## 2016-08-18 DIAGNOSIS — I351 Nonrheumatic aortic (valve) insufficiency: Secondary | ICD-10-CM | POA: Insufficient documentation

## 2016-08-18 DIAGNOSIS — Z87891 Personal history of nicotine dependence: Secondary | ICD-10-CM | POA: Diagnosis not present

## 2016-08-18 DIAGNOSIS — I313 Pericardial effusion (noninflammatory): Secondary | ICD-10-CM | POA: Diagnosis present

## 2016-08-20 ENCOUNTER — Telehealth: Payer: Self-pay | Admitting: Cardiovascular Disease

## 2016-08-20 NOTE — Telephone Encounter (Signed)
New message ° ° ° ° ° °Returning a call to the nurse to get echo results °

## 2016-08-20 NOTE — Telephone Encounter (Signed)
-----   Message from Thayer Headings, MD sent at 08/19/2016  5:26 PM EST ----- Moderate pericardial effusion.  The effusion is stable Continue to follow . Will anticipate repeating a limited echo in 3 or 6 months.   Will address at her next office visit

## 2016-08-20 NOTE — Telephone Encounter (Signed)
Spoke with patient to review echo results. I advised her to continue current treatment and offered to schedule her follow-up appointment for April. She asked to come back in July instead (which would be 6 month visit) and I asked if we could schedule her for June.  I advised her to call back to report if she experiences worsening SOB, chest tightness or other concerns.  She verbalized understanding and agreement with plan of care.

## 2016-08-24 ENCOUNTER — Other Ambulatory Visit: Payer: Self-pay | Admitting: *Deleted

## 2016-08-24 ENCOUNTER — Telehealth: Payer: Self-pay | Admitting: *Deleted

## 2016-08-24 ENCOUNTER — Ambulatory Visit: Payer: BLUE CROSS/BLUE SHIELD

## 2016-08-24 ENCOUNTER — Ambulatory Visit (HOSPITAL_BASED_OUTPATIENT_CLINIC_OR_DEPARTMENT_OTHER): Payer: BLUE CROSS/BLUE SHIELD | Admitting: Hematology & Oncology

## 2016-08-24 ENCOUNTER — Ambulatory Visit (HOSPITAL_BASED_OUTPATIENT_CLINIC_OR_DEPARTMENT_OTHER): Payer: BLUE CROSS/BLUE SHIELD

## 2016-08-24 ENCOUNTER — Other Ambulatory Visit (HOSPITAL_BASED_OUTPATIENT_CLINIC_OR_DEPARTMENT_OTHER): Payer: BLUE CROSS/BLUE SHIELD

## 2016-08-24 VITALS — Wt 181.0 lb

## 2016-08-24 DIAGNOSIS — K922 Gastrointestinal hemorrhage, unspecified: Secondary | ICD-10-CM

## 2016-08-24 DIAGNOSIS — C3492 Malignant neoplasm of unspecified part of left bronchus or lung: Secondary | ICD-10-CM

## 2016-08-24 DIAGNOSIS — E119 Type 2 diabetes mellitus without complications: Secondary | ICD-10-CM | POA: Diagnosis not present

## 2016-08-24 DIAGNOSIS — J84114 Acute interstitial pneumonitis: Secondary | ICD-10-CM

## 2016-08-24 DIAGNOSIS — Z5111 Encounter for antineoplastic chemotherapy: Secondary | ICD-10-CM

## 2016-08-24 DIAGNOSIS — E876 Hypokalemia: Secondary | ICD-10-CM

## 2016-08-24 DIAGNOSIS — C3490 Malignant neoplasm of unspecified part of unspecified bronchus or lung: Secondary | ICD-10-CM

## 2016-08-24 DIAGNOSIS — C3491 Malignant neoplasm of unspecified part of right bronchus or lung: Secondary | ICD-10-CM

## 2016-08-24 LAB — CMP (CANCER CENTER ONLY)
ALT(SGPT): 28 U/L (ref 10–47)
AST: 29 U/L (ref 11–38)
Albumin: 3.8 g/dL (ref 3.3–5.5)
Alkaline Phosphatase: 81 U/L (ref 26–84)
BILIRUBIN TOTAL: 0.7 mg/dL (ref 0.20–1.60)
BUN: 13 mg/dL (ref 7–22)
CO2: 30 meq/L (ref 18–33)
CREATININE: 1.5 mg/dL — AB (ref 0.6–1.2)
Calcium: 9.7 mg/dL (ref 8.0–10.3)
Chloride: 96 mEq/L — ABNORMAL LOW (ref 98–108)
GLUCOSE: 120 mg/dL — AB (ref 73–118)
Potassium: 2.9 mEq/L — CL (ref 3.3–4.7)
SODIUM: 142 meq/L (ref 128–145)
Total Protein: 7.3 g/dL (ref 6.4–8.1)

## 2016-08-24 LAB — CBC WITH DIFFERENTIAL (CANCER CENTER ONLY)
BASO#: 0.1 10*3/uL (ref 0.0–0.2)
BASO%: 0.7 % (ref 0.0–2.0)
EOS%: 1.6 % (ref 0.0–7.0)
Eosinophils Absolute: 0.1 10*3/uL (ref 0.0–0.5)
HCT: 27.8 % — ABNORMAL LOW (ref 34.8–46.6)
HGB: 9.5 g/dL — ABNORMAL LOW (ref 11.6–15.9)
LYMPH#: 0.8 10*3/uL — ABNORMAL LOW (ref 0.9–3.3)
LYMPH%: 11.5 % — ABNORMAL LOW (ref 14.0–48.0)
MCH: 37.7 pg — ABNORMAL HIGH (ref 26.0–34.0)
MCHC: 34.2 g/dL (ref 32.0–36.0)
MCV: 110 fL — ABNORMAL HIGH (ref 81–101)
MONO#: 1.1 10*3/uL — ABNORMAL HIGH (ref 0.1–0.9)
MONO%: 15.8 % — AB (ref 0.0–13.0)
NEUT#: 4.8 10*3/uL (ref 1.5–6.5)
NEUT%: 70.4 % (ref 39.6–80.0)
PLATELETS: 383 10*3/uL (ref 145–400)
RBC: 2.52 10*6/uL — ABNORMAL LOW (ref 3.70–5.32)
RDW: 14.9 % (ref 11.1–15.7)
WBC: 6.8 10*3/uL (ref 3.9–10.0)

## 2016-08-24 LAB — IRON AND TIBC
%SAT: 28 % (ref 21–57)
IRON: 111 ug/dL (ref 41–142)
TIBC: 395 ug/dL (ref 236–444)
UIBC: 284 ug/dL (ref 120–384)

## 2016-08-24 LAB — FERRITIN: Ferritin: 182 ng/ml (ref 9–269)

## 2016-08-24 MED ORDER — SODIUM CHLORIDE 0.9 % IV SOLN
Freq: Once | INTRAVENOUS | Status: AC
  Administered 2016-08-24: 11:00:00 via INTRAVENOUS

## 2016-08-24 MED ORDER — SODIUM CHLORIDE 0.9% FLUSH
10.0000 mL | INTRAVENOUS | Status: DC | PRN
  Administered 2016-08-24: 10 mL
  Filled 2016-08-24: qty 10

## 2016-08-24 MED ORDER — POTASSIUM CHLORIDE CRYS ER 20 MEQ PO TBCR
40.0000 meq | EXTENDED_RELEASE_TABLET | Freq: Once | ORAL | Status: AC
  Administered 2016-08-24: 40 meq via ORAL
  Filled 2016-08-24: qty 2

## 2016-08-24 MED ORDER — POTASSIUM CHLORIDE CRYS ER 20 MEQ PO TBCR
40.0000 meq | EXTENDED_RELEASE_TABLET | Freq: Two times a day (BID) | ORAL | Status: DC
  Filled 2016-08-24: qty 2

## 2016-08-24 MED ORDER — POTASSIUM CHLORIDE CRYS ER 20 MEQ PO TBCR: 20.0000 meq | EXTENDED_RELEASE_TABLET | Freq: Every day | ORAL | 6 refills | Status: AC

## 2016-08-24 MED ORDER — HEPARIN SOD (PORK) LOCK FLUSH 100 UNIT/ML IV SOLN
500.0000 [IU] | Freq: Once | INTRAVENOUS | Status: AC | PRN
  Administered 2016-08-24: 500 [IU]
  Filled 2016-08-24: qty 5

## 2016-08-24 MED ORDER — PROCHLORPERAZINE MALEATE 10 MG PO TABS
10.0000 mg | ORAL_TABLET | Freq: Once | ORAL | Status: AC
  Administered 2016-08-24: 10 mg via ORAL

## 2016-08-24 MED ORDER — PROCHLORPERAZINE MALEATE 10 MG PO TABS
ORAL_TABLET | ORAL | Status: AC
  Filled 2016-08-24: qty 1

## 2016-08-24 MED ORDER — SODIUM CHLORIDE 0.9 % IV SOLN
1000.0000 mg | Freq: Once | INTRAVENOUS | Status: AC
  Administered 2016-08-24: 1000 mg via INTRAVENOUS
  Filled 2016-08-24: qty 40

## 2016-08-24 NOTE — Patient Instructions (Signed)
[facility] Discharge Instructions for Patients Receiving Chemotherapy  Today you received the following chemotherapy agents; Alimta.   To help prevent nausea and vomiting after your treatment, we encourage you to take your nausea medication as directed.    If you develop nausea and vomiting that is not controlled by your nausea medication, call the clinic.   BELOW ARE SYMPTOMS THAT SHOULD BE REPORTED IMMEDIATELY:  *FEVER GREATER THAN 100.5 F  *CHILLS WITH OR WITHOUT FEVER  NAUSEA AND VOMITING THAT IS NOT CONTROLLED WITH YOUR NAUSEA MEDICATION  *UNUSUAL SHORTNESS OF BREATH  *UNUSUAL BRUISING OR BLEEDING  TENDERNESS IN MOUTH AND THROAT WITH OR WITHOUT PRESENCE OF ULCERS  *URINARY PROBLEMS  *BOWEL PROBLEMS  UNUSUAL RASH Items with * indicate a potential emergency and should be followed up as soon as possible.  Feel free to call the clinic you have any questions or concerns. The clinic phone number is [phone].  Please show the CHEMO ALERT CARD at check-in to the Emergency Department and triage nurse.   

## 2016-08-24 NOTE — Telephone Encounter (Signed)
Critical Value Potassium 2.9 Dr Ennever notified. Orders will be placed 

## 2016-08-24 NOTE — Progress Notes (Signed)
Hematology and Oncology Follow Up Visit  Carol Buchanan [id number] 05/30/1961 56 y.o. 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     Interim History:  Carol Buchanan here today for follow-up. She looks a little bit rougher. She is on oxygen. She's had just a, she had wear oxygen all day.  Part of the problem might be fact that she is not taking her potassium. She never got the potassium refilled. Her potassium today is 2.9.  She says that she is not bleeding. She said there's been no melena or bright red blood per rectum.  Her appetite is doing okay. She's had no nausea or vomiting.  Her mother is a little worried about her heart rate being a bit high. I told her that it probably is a combination of anemia and low potassium.  She's had no fever. She's had no rashes. She's had a lipid of a cough.  Her facial swelling is much better. She still has some periorbital edema.  Overall, her performance status is ECOG 1.   Medications:  Allergies as of 08/24/2016   No Known Allergies     Medication List       Accurate as of 08/24/16 11:30 AM. Always use your most recent med list.          calcium-vitamin D 500-200 MG-UNIT tablet Take 1 tablet by mouth daily.   DULoxetine 60 MG capsule Commonly known as:  CYMBALTA Take 60 mg by mouth daily. Reported on 08/06/2015   fluticasone 50 MCG/ACT nasal spray Commonly known as:  FLONASE Place 2 sprays into both nostrils daily.   folic acid 1 MG tablet Commonly known as:  FOLVITE Take 1 tablet (1 mg total) by mouth daily. Start 5-7 days before Alimta chemotherapy. Continue until 21  days after Alimta completed.   glipiZIDE 5 MG tablet Commonly known as:  GLUCOTROL Take 1 tablet (5 mg total) by mouth 2 (two) times daily before a meal.   ipratropium-albuterol 0.5-2.5 (3) MG/3ML Soln Commonly known as:  DUONEB Take 3 mLs by nebulization 3 (three) times daily.   lidocaine-prilocaine cream Commonly known as:  EMLA Apply to affected area once   LORazepam 0.5 MG tablet Commonly known as:  ATIVAN take 1 tablet by mouth every 6 hours if needed for nausea or vomiting   meloxicam 15 MG tablet Commonly known as:  MOBIC Take 15 mg by mouth every other day.   montelukast 10 MG tablet Commonly known as:  SINGULAIR take 1 tablet by mouth at bedtime   omeprazole 20 MG capsule Commonly known as:  PRILOSEC Take 1 capsule (20 mg total) by mouth daily.   ondansetron 4 MG tablet Commonly known as:  ZOFRAN take 1 tablet by mouth once daily   polyethylene glycol powder powder Commonly known as:  GLYCOLAX/MIRALAX take 17GM (DISSOLVED IN WATER) by mouth twice a day   prochlorperazine 10 MG tablet Commonly known as:  COMPAZINE take 1 tablet by mouth every 6 hours if needed for nausea or vomiting   simvastatin 20 MG tablet Commonly known as:  ZOCOR Take 20 mg by mouth daily.   STIOLTO RESPIMAT 2.5-2.5 MCG/ACT Aers Generic  drug:  Tiotropium Bromide-Olodaterol inhale 2 puffs INTO THE LUNGS DAILY   triamterene-hydrochlorothiazide 75-50 MG tablet Commonly known as:  MAXZIDE Take 1 tablet by mouth daily.   verapamil 180 MG (CO) 24 hr tablet Commonly known as:  COVERA HS Take 180 mg by mouth daily.   vitamin C 1000 MG tablet Take 1,000 mg by mouth daily.       Allergies: No Known Allergies  Past Medical History, Surgical history, Social history, and Family History were reviewed and updated.  Review of Systems: All other 10 point review of systems is negative.   Physical Exam:  weight is 181 lb (82.1 kg).   Wt Readings from Last 3 Encounters:  08/24/16  181 lb (82.1 kg)  08/04/16 184 lb (83.5 kg)  08/04/16 184 lb 12.8 oz (83.8 kg)    Mildly cushingoid appearing white female in no obvious distress. Head and neck exam shows no ocular or oral lesions. She has no thrush. No adenopathy is noted in the neck. Lungs show decent breath sounds bilaterally.  She has occasional wheezes. Cardiac exam regular rate and rhythm with no murmurs, rubs or bruits. Abdomen is soft. She is slightly obese. She has good bowel sounds. She has no guarding or rebound tenderness. She has no palpable hepatosplenomegaly. Back exam shows no tenderness over the spine, ribs or hips. Extremities shows no edema in her legs. She has good range of motion of her joints. Skin exam shows no rashes, ecchymoses or petechia. Rectal exam shows no tears or fissures. She has no external hemorrhoids area and stool is brownish red and grossly heme positive. Neurological exam shows no focal neurological deficits.   Lab Results  Component Value Date   WBC 6.8 08/24/2016   HGB 9.5 (L) 08/24/2016   HCT 27.8 (L) 08/24/2016   MCV 110 (H) 08/24/2016   PLT 383 08/24/2016   Lab Results  Component Value Date   FERRITIN 147 08/03/2016   IRON 116 08/03/2016   TIBC 428 08/03/2016   UIBC 311 08/03/2016   IRONPCTSAT 27 08/03/2016   Lab Results  Component Value Date   RBC 2.52 (L) 08/24/2016   No results found for: KPAFRELGTCHN, LAMBDASER, KAPLAMBRATIO No results found for: IGGSERUM, IGA, IGMSERUM No results found for: Odetta Pink, SPEI   Chemistry      Component Value Date/Time   NA 142 08/24/2016 0956   NA 135 (L) 11/13/2015 1131   K 2.9 (LL) 08/24/2016 0956   K 4.4 11/13/2015 1131   CL 96 (L) 08/24/2016 0956   CO2 30 08/24/2016 0956   CO2 21 (L) 11/13/2015 1131   BUN 13 08/24/2016 0956   BUN 18.7 11/13/2015 1131   CREATININE 1.5 (H) 08/24/2016 0956   CREATININE 0.8 11/13/2015 1131      Component Value Date/Time   CALCIUM 9.7  08/24/2016 0956   CALCIUM 8.9 11/13/2015 1131   ALKPHOS 81 08/24/2016 0956   ALKPHOS 66 11/13/2015 1131   AST 29 08/24/2016 0956   AST 22 11/13/2015 1131   ALT 28 08/24/2016 0956   ALT 27 11/13/2015 1131   BILITOT 0.70 08/24/2016 0956   BILITOT 0.39 11/13/2015 1131     Impression and Plan: Carol Buchanan is a 56 yo white female with metastatic poorly differentiated carcinoma of the left lung and wild-type for the typical genetic mutations.   ISuspect that part of her breathing problems might be the hypokalemia. She has not been taking potassium. We will give  her 14mq of potassium right now and then I will give her 40 before she leaves. I will call in potassium for her. Apparently, she never got potassium refilled from her family doctor.   I will get her back in 3 weeks. I don't think we have to do any scans on her for another couple months.    EVolanda Napoleon MD 1/29/201811:30 AM

## 2016-08-26 ENCOUNTER — Other Ambulatory Visit: Payer: Self-pay | Admitting: Hematology & Oncology

## 2016-08-26 DIAGNOSIS — C3491 Malignant neoplasm of unspecified part of right bronchus or lung: Secondary | ICD-10-CM

## 2016-09-04 ENCOUNTER — Other Ambulatory Visit: Payer: Self-pay | Admitting: Family

## 2016-09-14 ENCOUNTER — Ambulatory Visit (HOSPITAL_BASED_OUTPATIENT_CLINIC_OR_DEPARTMENT_OTHER): Payer: BLUE CROSS/BLUE SHIELD | Admitting: Hematology & Oncology

## 2016-09-14 ENCOUNTER — Other Ambulatory Visit (HOSPITAL_BASED_OUTPATIENT_CLINIC_OR_DEPARTMENT_OTHER): Payer: BLUE CROSS/BLUE SHIELD

## 2016-09-14 ENCOUNTER — Ambulatory Visit (HOSPITAL_BASED_OUTPATIENT_CLINIC_OR_DEPARTMENT_OTHER): Payer: BLUE CROSS/BLUE SHIELD

## 2016-09-14 ENCOUNTER — Ambulatory Visit (HOSPITAL_COMMUNITY)
Admission: RE | Admit: 2016-09-14 | Discharge: 2016-09-14 | Disposition: A | Discharge status: Home / Self Care | Payer: BLUE CROSS/BLUE SHIELD | Source: Ambulatory Visit | Attending: Hematology & Oncology | Admitting: Hematology & Oncology

## 2016-09-14 ENCOUNTER — Ambulatory Visit: Payer: BLUE CROSS/BLUE SHIELD

## 2016-09-14 VITALS — BP 125/77 | HR 109 | Temp 98.4°F | Resp 19 | Wt 180.0 lb

## 2016-09-14 DIAGNOSIS — C3492 Malignant neoplasm of unspecified part of left bronchus or lung: Secondary | ICD-10-CM | POA: Diagnosis not present

## 2016-09-14 DIAGNOSIS — D649 Anemia, unspecified: Secondary | ICD-10-CM | POA: Insufficient documentation

## 2016-09-14 DIAGNOSIS — Z5111 Encounter for antineoplastic chemotherapy: Secondary | ICD-10-CM

## 2016-09-14 DIAGNOSIS — J189 Pneumonia, unspecified organism: Secondary | ICD-10-CM

## 2016-09-14 DIAGNOSIS — C3491 Malignant neoplasm of unspecified part of right bronchus or lung: Secondary | ICD-10-CM | POA: Insufficient documentation

## 2016-09-14 DIAGNOSIS — D500 Iron deficiency anemia secondary to blood loss (chronic): Secondary | ICD-10-CM | POA: Diagnosis not present

## 2016-09-14 DIAGNOSIS — K922 Gastrointestinal hemorrhage, unspecified: Secondary | ICD-10-CM

## 2016-09-14 DIAGNOSIS — E876 Hypokalemia: Secondary | ICD-10-CM

## 2016-09-14 DIAGNOSIS — J9612 Chronic respiratory failure with hypercapnia: Secondary | ICD-10-CM

## 2016-09-14 DIAGNOSIS — J9611 Chronic respiratory failure with hypoxia: Secondary | ICD-10-CM

## 2016-09-14 DIAGNOSIS — C3490 Malignant neoplasm of unspecified part of unspecified bronchus or lung: Secondary | ICD-10-CM

## 2016-09-14 LAB — CMP (CANCER CENTER ONLY)
ALK PHOS: 85 U/L — AB (ref 26–84)
ALT: 22 U/L (ref 10–47)
AST: 28 U/L (ref 11–38)
Albumin: 3.6 g/dL (ref 3.3–5.5)
BUN, Bld: 14 mg/dL (ref 7–22)
CO2: 30 mEq/L (ref 18–33)
Calcium: 9.2 mg/dL (ref 8.0–10.3)
Chloride: 90 mEq/L — ABNORMAL LOW (ref 98–108)
Creat: 1.4 mg/dl — ABNORMAL HIGH (ref 0.6–1.2)
GLUCOSE: 114 mg/dL (ref 73–118)
Potassium: 3.2 mEq/L — ABNORMAL LOW (ref 3.3–4.7)
SODIUM: 140 meq/L (ref 128–145)
Total Bilirubin: 0.7 mg/dl (ref 0.20–1.60)
Total Protein: 7.3 g/dL (ref 6.4–8.1)

## 2016-09-14 LAB — CBC WITH DIFFERENTIAL (CANCER CENTER ONLY)
BASO#: 0.1 10*3/uL (ref 0.0–0.2)
BASO%: 0.8 % (ref 0.0–2.0)
EOS%: 1.1 % (ref 0.0–7.0)
Eosinophils Absolute: 0.1 10*3/uL (ref 0.0–0.5)
HCT: 26 % — ABNORMAL LOW (ref 34.8–46.6)
HGB: 8.7 g/dL — ABNORMAL LOW (ref 11.6–15.9)
LYMPH#: 0.7 10*3/uL — AB (ref 0.9–3.3)
LYMPH%: 9.4 % — AB (ref 14.0–48.0)
MCH: 37.2 pg — ABNORMAL HIGH (ref 26.0–34.0)
MCHC: 33.5 g/dL (ref 32.0–36.0)
MCV: 111 fL — ABNORMAL HIGH (ref 81–101)
MONO#: 1.3 10*3/uL — ABNORMAL HIGH (ref 0.1–0.9)
MONO%: 17.3 % — ABNORMAL HIGH (ref 0.0–13.0)
NEUT#: 5.1 10*3/uL (ref 1.5–6.5)
NEUT%: 71.4 % (ref 39.6–80.0)
Platelets: 380 10*3/uL (ref 145–400)
RBC: 2.34 10*6/uL — ABNORMAL LOW (ref 3.70–5.32)
RDW: 14.7 % (ref 11.1–15.7)
WBC: 7.2 10*3/uL (ref 3.9–10.0)

## 2016-09-14 LAB — IRON AND TIBC
%SAT: 21 % (ref 21–57)
Iron: 77 ug/dL (ref 41–142)
TIBC: 374 ug/dL (ref 236–444)
UIBC: 296 ug/dL (ref 120–384)

## 2016-09-14 LAB — FERRITIN: Ferritin: 253 ng/ml (ref 9–269)

## 2016-09-14 MED ORDER — PROCHLORPERAZINE MALEATE 10 MG PO TABS
10.0000 mg | ORAL_TABLET | Freq: Once | ORAL | Status: AC
  Administered 2016-09-14: 10 mg via ORAL

## 2016-09-14 MED ORDER — PROCHLORPERAZINE MALEATE 10 MG PO TABS
ORAL_TABLET | ORAL | Status: AC
  Filled 2016-09-14: qty 1

## 2016-09-14 MED ORDER — HEPARIN SOD (PORK) LOCK FLUSH 100 UNIT/ML IV SOLN
500.0000 [IU] | Freq: Once | INTRAVENOUS | Status: AC | PRN
  Administered 2016-09-14: 500 [IU]
  Filled 2016-09-14: qty 5

## 2016-09-14 MED ORDER — CYANOCOBALAMIN 1000 MCG/ML IJ SOLN
INTRAMUSCULAR | Status: AC
  Filled 2016-09-14: qty 1

## 2016-09-14 MED ORDER — IPRATROPIUM-ALBUTEROL 0.5-2.5 (3) MG/3ML IN SOLN
RESPIRATORY_TRACT | Status: AC
  Filled 2016-09-14: qty 3

## 2016-09-14 MED ORDER — SODIUM CHLORIDE 0.9 % IV SOLN
525.0000 mg/m2 | Freq: Once | INTRAVENOUS | Status: AC
  Administered 2016-09-14: 1000 mg via INTRAVENOUS
  Filled 2016-09-14: qty 40

## 2016-09-14 MED ORDER — SODIUM CHLORIDE 0.9 % IV SOLN
Freq: Once | INTRAVENOUS | Status: AC
  Administered 2016-09-14: 12:00:00 via INTRAVENOUS

## 2016-09-14 MED ORDER — SODIUM CHLORIDE 0.9% FLUSH
10.0000 mL | INTRAVENOUS | Status: DC | PRN
  Administered 2016-09-14: 10 mL
  Filled 2016-09-14: qty 10

## 2016-09-14 MED ORDER — IPRATROPIUM-ALBUTEROL 0.5-2.5 (3) MG/3ML IN SOLN
3.0000 mL | Freq: Once | RESPIRATORY_TRACT | Status: AC
  Administered 2016-09-14: 3 mL via RESPIRATORY_TRACT

## 2016-09-14 MED ORDER — CYANOCOBALAMIN 1000 MCG/ML IJ SOLN
1000.0000 ug | Freq: Once | INTRAMUSCULAR | Status: AC
  Administered 2016-09-14: 1000 ug via INTRAMUSCULAR

## 2016-09-14 NOTE — Progress Notes (Signed)
Hematology and Oncology Follow Up Visit  MARION SEESE [id number] 1961-06-29 56 y.o. 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     Interim History:  Ms. DEastonis here today for follow-up. She looks a little bit rougher. Her breathing seems to be doing a little bit worse. She is on 4 L of oxygen right now.  She just feels very weak and tired. She does not have a lot of energy.  She's not noted any melena or bright red blood per rectum.  She is more anemic today. Hemoglobin is down to 8.7. I think she is a person who will need her hemoglobin above 10. I suspect we will have to transfuse her. I talked to her about this today.  We also will have to get her set up with another PET scan. Her last one was done back in December. I think this will help [place] and see if she is still responding.  Overall, I think she is done well with chemotherapy. Her appetite is about the same. She's had a little nausea but no vomiting.  She's had no rashes. She has had no leg swelling.  The swelling about her eyes is not as bad.  She had marked hypokalemia we last saw her. She has been on supplemental potassium.   Overall, her performance status is ECOG 2.   Medications:  Allergies as of 09/14/2016   No Known Allergies     Medication List       Accurate as of 09/14/16 12:07 PM. Always use your most recent med list.          calcium-vitamin D 500-200 MG-UNIT tablet Take 1 tablet by mouth daily.   DULoxetine 60 MG capsule Commonly known as:  CYMBALTA Take 60 mg by mouth daily. Reported on 08/06/2015   fluticasone 50 MCG/ACT nasal  spray Commonly known as:  FLONASE Place 2 sprays into both nostrils daily.   folic acid 1 MG tablet Commonly known as:  FOLVITE Take 1 tablet (1 mg total) by mouth daily. Start 5-7 days before Alimta chemotherapy. Continue until 21 days after Alimta completed.   glipiZIDE 5 MG tablet Commonly known as:  GLUCOTROL Take 1 tablet (5 mg total) by mouth 2 (two) times daily before a meal.   ipratropium-albuterol 0.5-2.5 (3) MG/3ML Soln Commonly known as:  DUONEB Take 3 mLs by nebulization 3 (three) times daily.   lidocaine-prilocaine cream Commonly known as:  EMLA Apply to affected area once   LORazepam 0.5 MG tablet Commonly known as:  ATIVAN take 1 tablet by mouth every 6 hours if needed for nausea or vomiting   meloxicam 15 MG tablet Commonly known as:  MOBIC Take 15 mg by mouth every other day.   montelukast 10 MG tablet Commonly known as:  SINGULAIR take 1 tablet by mouth at bedtime   omeprazole 20 MG capsule Commonly known as:  PRILOSEC Take 1 capsule (20 mg total) by mouth daily.   ondansetron 4 MG tablet Commonly known as:  ZOFRAN take 1 tablet by mouth once daily   polyethylene glycol powder powder Commonly known as:  GLYCOLAX/MIRALAX take 17GM (DISSOLVED IN WATER) by mouth twice  a day   potassium chloride SA 20 MEQ tablet Commonly known as:  K-DUR,KLOR-CON Take 1 tablet (20 mEq total) by mouth daily.   prochlorperazine 10 MG tablet Commonly known as:  COMPAZINE take 1 tablet by mouth every 6 hours if needed for nausea or vomiting   simvastatin 20 MG tablet Commonly known as:  ZOCOR Take 20 mg by mouth daily.   STIOLTO RESPIMAT 2.5-2.5 MCG/ACT Aers Generic drug:  Tiotropium Bromide-Olodaterol inhale 2 puffs INTO THE LUNGS DAILY   triamterene-hydrochlorothiazide 75-50 MG tablet Commonly known as:  MAXZIDE Take 1 tablet by mouth daily.   verapamil 180 MG (CO) 24 hr tablet Commonly known as:  COVERA HS Take 180 mg by mouth daily.   vitamin C 1000 MG  tablet Take 1,000 mg by mouth daily.       Allergies: No Known Allergies  Past Medical History, Surgical history, Social history, and Family History were reviewed and updated.  Review of Systems: All other 10 point review of systems is negative.   Physical Exam:  weight is 180 lb (81.6 kg). Her oral temperature is 98.4 F (36.9 C). Her blood pressure is 125/77 and her pulse is 109 (abnormal). Her respiration is 19 and oxygen saturation is 98%.   Wt Readings from Last 3 Encounters:  09/14/16 180 lb (81.6 kg)  08/24/16 181 lb (82.1 kg)  08/04/16 184 lb (83.5 kg)    Mildly cushingoid appearing white female in no obvious distress. Head and neck exam shows no ocular or oral lesions. She has no thrush. No adenopathy is noted in the neck. Lungs show decent breath sounds bilaterally.  She has occasional wheezes. Cardiac exam regular rate and rhythm with no murmurs, rubs or bruits. Abdomen is soft. She is slightly obese. She has good bowel sounds. She has no guarding or rebound tenderness. She has no palpable hepatosplenomegaly. Rectal exam showed no masses in the rectal vault. She has brown stool that is heme negative. Back exam shows no tenderness over the spine, ribs or hips. Extremities shows no edema in her legs. She has good range of motion of her joints. Skin exam shows no rashes, ecchymoses or petechia.  Neurological exam shows no focal neurological deficits.   Lab Results  Component Value Date   WBC 7.2 09/14/2016   HGB 8.7 (L) 09/14/2016   HCT 26.0 (L) 09/14/2016   MCV 111 (H) 09/14/2016   PLT 380 09/14/2016   Lab Results  Component Value Date   FERRITIN 182 08/24/2016   IRON 111 08/24/2016   TIBC 395 08/24/2016   UIBC 284 08/24/2016   IRONPCTSAT 28 08/24/2016   Lab Results  Component Value Date   RBC 2.34 (L) 09/14/2016   No results found for: KPAFRELGTCHN, LAMBDASER, KAPLAMBRATIO No results found for: IGGSERUM, IGA, IGMSERUM No results found for: TOTALPROTELP,  ALBUMINELP, A1GS, A2GS, BETS, BETA2SER, GAMS, MSPIKE, SPEI   Chemistry      Component Value Date/Time   NA 140 09/14/2016 1046   NA 135 (L) 11/13/2015 1131   K 3.2 (L) 09/14/2016 1046   K 4.4 11/13/2015 1131   CL 90 (L) 09/14/2016 1046   CO2 30 09/14/2016 1046   CO2 21 (L) 11/13/2015 1131   BUN 14 09/14/2016 1046   BUN 18.7 11/13/2015 1131   CREATININE 1.4 (H) 09/14/2016 1046   CREATININE 0.8 11/13/2015 1131      Component Value Date/Time   CALCIUM 9.2 09/14/2016 1046   CALCIUM 8.9 11/13/2015 1131   ALKPHOS 85 (H)  09/14/2016 1046   ALKPHOS 66 11/13/2015 1131   AST 28 09/14/2016 1046   AST 22 11/13/2015 1131   ALT 22 09/14/2016 1046   ALT 27 11/13/2015 1131   BILITOT 0.70 09/14/2016 1046   BILITOT 0.39 11/13/2015 1131     Impression and Plan: Ms. Yerby is a 56 yo white female with metastatic poorly differentiated carcinoma of the left lung and wild-type for the typical genetic mutations.   We will go ahead and transfuse her tomorrow. I will give her 2 units of blood. We'll go ahead and treat her today with Alimta.  We will see about a PET scan in 2 weeks. I think this will be quite informative.  I spent about 30 minutes with she and her mom. She is somewhat complicated. 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 Possibly, this could be getting worse. We may have to consider some pulmonary function tests.  I told her mom that she has to do nebulizers 3 or 4 times a day.     Volanda Napoleon, MD 2/19/201812:07 PM

## 2016-09-14 NOTE — Patient Instructions (Signed)
[facility] Discharge Instructions for Patients Receiving Chemotherapy  Today you received the following chemotherapy agents; Alimta.   To help prevent nausea and vomiting after your treatment, we encourage you to take your nausea medication as directed.    If you develop nausea and vomiting that is not controlled by your nausea medication, call the clinic.   BELOW ARE SYMPTOMS THAT SHOULD BE REPORTED IMMEDIATELY:  *FEVER GREATER THAN 100.5 F  *CHILLS WITH OR WITHOUT FEVER  NAUSEA AND VOMITING THAT IS NOT CONTROLLED WITH YOUR NAUSEA MEDICATION  *UNUSUAL SHORTNESS OF BREATH  *UNUSUAL BRUISING OR BLEEDING  TENDERNESS IN MOUTH AND THROAT WITH OR WITHOUT PRESENCE OF ULCERS  *URINARY PROBLEMS  *BOWEL PROBLEMS  UNUSUAL RASH Items with * indicate a potential emergency and should be followed up as soon as possible.  Feel free to call the clinic you have any questions or concerns. The clinic phone number is [phone].  Please show the CHEMO ALERT CARD at check-in to the Emergency Department and triage nurse.   

## 2016-09-15 ENCOUNTER — Encounter: Payer: Self-pay | Admitting: Hematology & Oncology

## 2016-09-15 ENCOUNTER — Ambulatory Visit (HOSPITAL_BASED_OUTPATIENT_CLINIC_OR_DEPARTMENT_OTHER): Payer: BLUE CROSS/BLUE SHIELD

## 2016-09-15 DIAGNOSIS — C3492 Malignant neoplasm of unspecified part of left bronchus or lung: Secondary | ICD-10-CM | POA: Diagnosis not present

## 2016-09-15 DIAGNOSIS — C3491 Malignant neoplasm of unspecified part of right bronchus or lung: Secondary | ICD-10-CM | POA: Diagnosis not present

## 2016-09-15 LAB — PREPARE RBC (CROSSMATCH)

## 2016-09-15 MED ORDER — SODIUM CHLORIDE 0.9 % IV SOLN
250.0000 mL | Freq: Once | INTRAVENOUS | Status: AC
  Administered 2016-09-15: 250 mL via INTRAVENOUS

## 2016-09-15 MED ORDER — FUROSEMIDE 10 MG/ML IJ SOLN
INTRAMUSCULAR | Status: AC
  Filled 2016-09-15: qty 4

## 2016-09-15 MED ORDER — FUROSEMIDE 10 MG/ML IJ SOLN
20.0000 mg | Freq: Once | INTRAMUSCULAR | Status: AC
  Administered 2016-09-15: 20 mg via INTRAVENOUS

## 2016-09-15 MED ORDER — SODIUM CHLORIDE 0.9% FLUSH
10.0000 mL | INTRAVENOUS | Status: AC | PRN
  Administered 2016-09-15: 10 mL
  Filled 2016-09-15: qty 10

## 2016-09-15 MED ORDER — HEPARIN SOD (PORK) LOCK FLUSH 100 UNIT/ML IV SOLN
500.0000 [IU] | Freq: Every day | INTRAVENOUS | Status: AC | PRN
  Administered 2016-09-15: 500 [IU]
  Filled 2016-09-15: qty 5

## 2016-09-15 NOTE — Patient Instructions (Signed)

## 2016-09-16 LAB — TYPE AND SCREEN
ABO/RH(D): O POS
ANTIBODY SCREEN: NEGATIVE
Unit division: 0
Unit division: 0

## 2016-09-22 ENCOUNTER — Emergency Department (HOSPITAL_COMMUNITY): Payer: Medicare Other

## 2016-09-22 ENCOUNTER — Inpatient Hospital Stay (HOSPITAL_COMMUNITY)
Admission: EM | Admit: 2016-09-22 | Discharge: 2016-10-02 | DRG: 270 | Disposition: A | Discharge status: Home / Self Care | Payer: Medicare Other | Attending: Internal Medicine | Admitting: Internal Medicine

## 2016-09-22 ENCOUNTER — Other Ambulatory Visit: Payer: Self-pay

## 2016-09-22 ENCOUNTER — Encounter (HOSPITAL_COMMUNITY): Payer: Self-pay | Admitting: *Deleted

## 2016-09-22 ENCOUNTER — Telehealth: Payer: Self-pay | Admitting: *Deleted

## 2016-09-22 DIAGNOSIS — R0600 Dyspnea, unspecified: Secondary | ICD-10-CM | POA: Diagnosis not present

## 2016-09-22 DIAGNOSIS — Z86711 Personal history of pulmonary embolism: Secondary | ICD-10-CM | POA: Diagnosis not present

## 2016-09-22 DIAGNOSIS — I313 Pericardial effusion (noninflammatory): Principal | ICD-10-CM | POA: Diagnosis present

## 2016-09-22 DIAGNOSIS — Z9981 Dependence on supplemental oxygen: Secondary | ICD-10-CM | POA: Diagnosis not present

## 2016-09-22 DIAGNOSIS — J90 Pleural effusion, not elsewhere classified: Secondary | ICD-10-CM | POA: Diagnosis present

## 2016-09-22 DIAGNOSIS — J189 Pneumonia, unspecified organism: Secondary | ICD-10-CM

## 2016-09-22 DIAGNOSIS — I5032 Chronic diastolic (congestive) heart failure: Secondary | ICD-10-CM | POA: Diagnosis present

## 2016-09-22 DIAGNOSIS — E876 Hypokalemia: Secondary | ICD-10-CM | POA: Diagnosis present

## 2016-09-22 DIAGNOSIS — N183 Chronic kidney disease, stage 3 (moderate): Secondary | ICD-10-CM | POA: Diagnosis present

## 2016-09-22 DIAGNOSIS — D61818 Other pancytopenia: Secondary | ICD-10-CM | POA: Diagnosis present

## 2016-09-22 DIAGNOSIS — I130 Hypertensive heart and chronic kidney disease with heart failure and stage 1 through stage 4 chronic kidney disease, or unspecified chronic kidney disease: Secondary | ICD-10-CM | POA: Diagnosis present

## 2016-09-22 DIAGNOSIS — C7951 Secondary malignant neoplasm of bone: Secondary | ICD-10-CM | POA: Diagnosis present

## 2016-09-22 DIAGNOSIS — E785 Hyperlipidemia, unspecified: Secondary | ICD-10-CM | POA: Diagnosis present

## 2016-09-22 DIAGNOSIS — R000 Tachycardia, unspecified: Secondary | ICD-10-CM | POA: Diagnosis not present

## 2016-09-22 DIAGNOSIS — Z66 Do not resuscitate: Secondary | ICD-10-CM | POA: Diagnosis present

## 2016-09-22 DIAGNOSIS — I2699 Other pulmonary embolism without acute cor pulmonale: Secondary | ICD-10-CM | POA: Diagnosis not present

## 2016-09-22 DIAGNOSIS — Z7984 Long term (current) use of oral hypoglycemic drugs: Secondary | ICD-10-CM

## 2016-09-22 DIAGNOSIS — I10 Essential (primary) hypertension: Secondary | ICD-10-CM | POA: Diagnosis present

## 2016-09-22 DIAGNOSIS — J449 Chronic obstructive pulmonary disease, unspecified: Secondary | ICD-10-CM | POA: Diagnosis present

## 2016-09-22 DIAGNOSIS — Z8249 Family history of ischemic heart disease and other diseases of the circulatory system: Secondary | ICD-10-CM

## 2016-09-22 DIAGNOSIS — C3412 Malignant neoplasm of upper lobe, left bronchus or lung: Secondary | ICD-10-CM | POA: Diagnosis present

## 2016-09-22 DIAGNOSIS — E1142 Type 2 diabetes mellitus with diabetic polyneuropathy: Secondary | ICD-10-CM | POA: Diagnosis present

## 2016-09-22 DIAGNOSIS — Z923 Personal history of irradiation: Secondary | ICD-10-CM

## 2016-09-22 DIAGNOSIS — Z4682 Encounter for fitting and adjustment of non-vascular catheter: Secondary | ICD-10-CM

## 2016-09-22 DIAGNOSIS — J431 Panlobular emphysema: Secondary | ICD-10-CM | POA: Diagnosis not present

## 2016-09-22 DIAGNOSIS — N179 Acute kidney failure, unspecified: Secondary | ICD-10-CM | POA: Diagnosis present

## 2016-09-22 DIAGNOSIS — I319 Disease of pericardium, unspecified: Secondary | ICD-10-CM | POA: Diagnosis not present

## 2016-09-22 DIAGNOSIS — K5900 Constipation, unspecified: Secondary | ICD-10-CM | POA: Diagnosis present

## 2016-09-22 DIAGNOSIS — R1084 Generalized abdominal pain: Secondary | ICD-10-CM

## 2016-09-22 DIAGNOSIS — J9611 Chronic respiratory failure with hypoxia: Secondary | ICD-10-CM | POA: Diagnosis present

## 2016-09-22 DIAGNOSIS — Z9221 Personal history of antineoplastic chemotherapy: Secondary | ICD-10-CM | POA: Diagnosis not present

## 2016-09-22 DIAGNOSIS — I2609 Other pulmonary embolism with acute cor pulmonale: Secondary | ICD-10-CM | POA: Diagnosis not present

## 2016-09-22 DIAGNOSIS — J410 Simple chronic bronchitis: Secondary | ICD-10-CM

## 2016-09-22 DIAGNOSIS — Z9689 Presence of other specified functional implants: Secondary | ICD-10-CM

## 2016-09-22 DIAGNOSIS — R0602 Shortness of breath: Secondary | ICD-10-CM

## 2016-09-22 DIAGNOSIS — C3492 Malignant neoplasm of unspecified part of left bronchus or lung: Secondary | ICD-10-CM | POA: Diagnosis not present

## 2016-09-22 DIAGNOSIS — I2602 Saddle embolus of pulmonary artery with acute cor pulmonale: Secondary | ICD-10-CM | POA: Diagnosis not present

## 2016-09-22 DIAGNOSIS — I314 Cardiac tamponade: Secondary | ICD-10-CM | POA: Diagnosis present

## 2016-09-22 DIAGNOSIS — Z79899 Other long term (current) drug therapy: Secondary | ICD-10-CM

## 2016-09-22 DIAGNOSIS — T451X5A Adverse effect of antineoplastic and immunosuppressive drugs, initial encounter: Secondary | ICD-10-CM | POA: Diagnosis present

## 2016-09-22 DIAGNOSIS — J9610 Chronic respiratory failure, unspecified whether with hypoxia or hypercapnia: Secondary | ICD-10-CM

## 2016-09-22 DIAGNOSIS — C3490 Malignant neoplasm of unspecified part of unspecified bronchus or lung: Secondary | ICD-10-CM | POA: Diagnosis present

## 2016-09-22 DIAGNOSIS — I2782 Chronic pulmonary embolism: Secondary | ICD-10-CM | POA: Diagnosis not present

## 2016-09-22 DIAGNOSIS — Z419 Encounter for procedure for purposes other than remedying health state, unspecified: Secondary | ICD-10-CM

## 2016-09-22 DIAGNOSIS — D649 Anemia, unspecified: Secondary | ICD-10-CM | POA: Diagnosis not present

## 2016-09-22 DIAGNOSIS — Z7901 Long term (current) use of anticoagulants: Secondary | ICD-10-CM | POA: Diagnosis not present

## 2016-09-22 DIAGNOSIS — E1122 Type 2 diabetes mellitus with diabetic chronic kidney disease: Secondary | ICD-10-CM | POA: Diagnosis present

## 2016-09-22 DIAGNOSIS — R109 Unspecified abdominal pain: Secondary | ICD-10-CM

## 2016-09-22 DIAGNOSIS — Z87891 Personal history of nicotine dependence: Secondary | ICD-10-CM

## 2016-09-22 DIAGNOSIS — J939 Pneumothorax, unspecified: Secondary | ICD-10-CM

## 2016-09-22 LAB — I-STAT TROPONIN, ED: TROPONIN I, POC: 0 ng/mL (ref 0.00–0.08)

## 2016-09-22 LAB — COMPREHENSIVE METABOLIC PANEL
ALT: 41 U/L (ref 14–54)
ANION GAP: 10 (ref 5–15)
AST: 40 U/L (ref 15–41)
Albumin: 3.2 g/dL — ABNORMAL LOW (ref 3.5–5.0)
Alkaline Phosphatase: 157 U/L — ABNORMAL HIGH (ref 38–126)
BUN: 20 mg/dL (ref 6–20)
CHLORIDE: 93 mmol/L — AB (ref 101–111)
CO2: 31 mmol/L (ref 22–32)
Calcium: 8.9 mg/dL (ref 8.9–10.3)
Creatinine, Ser: 1.16 mg/dL — ABNORMAL HIGH (ref 0.44–1.00)
GFR calc non Af Amer: 52 mL/min — ABNORMAL LOW (ref >60)
GFR, EST AFRICAN AMERICAN: 60 mL/min — AB (ref >60)
Glucose, Bld: 89 mg/dL (ref 65–99)
POTASSIUM: 2.9 mmol/L — AB (ref 3.5–5.1)
Sodium: 134 mmol/L — ABNORMAL LOW (ref 135–145)
TOTAL PROTEIN: 7.2 g/dL (ref 6.5–8.1)
Total Bilirubin: 0.5 mg/dL (ref 0.3–1.2)

## 2016-09-22 LAB — CBC WITH DIFFERENTIAL/PLATELET
Basophils Absolute: 0 10*3/uL (ref 0.0–0.1)
Basophils Relative: 0 %
Eosinophils Absolute: 0 10*3/uL (ref 0.0–0.7)
Eosinophils Relative: 2 %
HCT: 23.8 % — ABNORMAL LOW (ref 36.0–46.0)
Hemoglobin: 8 g/dL — ABNORMAL LOW (ref 12.0–15.0)
LYMPHS ABS: 0.4 10*3/uL — AB (ref 0.7–4.0)
Lymphocytes Relative: 25 %
MCH: 32.9 pg (ref 26.0–34.0)
MCHC: 33.6 g/dL (ref 30.0–36.0)
MCV: 97.9 fL (ref 78.0–100.0)
MONO ABS: 0.4 10*3/uL (ref 0.1–1.0)
Monocytes Relative: 27 %
NEUTROS ABS: 0.8 10*3/uL — AB (ref 1.7–7.7)
Neutrophils Relative %: 46 %
PLATELETS: 134 10*3/uL — AB (ref 150–400)
RBC: 2.43 MIL/uL — AB (ref 3.87–5.11)
RDW: 16.7 % — AB (ref 11.5–15.5)
WBC: 1.6 10*3/uL — AB (ref 4.0–10.5)

## 2016-09-22 LAB — BRAIN NATRIURETIC PEPTIDE: B Natriuretic Peptide: 72.8 pg/mL (ref 0.0–100.0)

## 2016-09-22 LAB — PROTIME-INR
INR: 1.15
PROTHROMBIN TIME: 14.8 s (ref 11.4–15.2)

## 2016-09-22 LAB — APTT: aPTT: 48 seconds — ABNORMAL HIGH (ref 24–36)

## 2016-09-22 LAB — I-STAT CG4 LACTIC ACID, ED: LACTIC ACID, VENOUS: 0.91 mmol/L (ref 0.5–1.9)

## 2016-09-22 MED ORDER — IOPAMIDOL (ISOVUE-370) INJECTION 76%
100.0000 mL | Freq: Once | INTRAVENOUS | Status: AC | PRN
  Administered 2016-09-22: 65 mL via INTRAVENOUS

## 2016-09-22 MED ORDER — ACETAMINOPHEN 325 MG PO TABS
650.0000 mg | ORAL_TABLET | Freq: Four times a day (QID) | ORAL | Status: DC | PRN
  Administered 2016-09-23: 650 mg via ORAL
  Filled 2016-09-22: qty 2

## 2016-09-22 MED ORDER — POTASSIUM CHLORIDE CRYS ER 20 MEQ PO TBCR
40.0000 meq | EXTENDED_RELEASE_TABLET | Freq: Two times a day (BID) | ORAL | Status: AC
  Administered 2016-09-22 – 2016-09-23 (×2): 40 meq via ORAL
  Filled 2016-09-22 (×2): qty 2

## 2016-09-22 MED ORDER — HEPARIN BOLUS VIA INFUSION
2000.0000 [IU] | Freq: Once | INTRAVENOUS | Status: AC
  Administered 2016-09-22: 2000 [IU] via INTRAVENOUS
  Filled 2016-09-22: qty 2000

## 2016-09-22 MED ORDER — SODIUM CHLORIDE 0.9% FLUSH
3.0000 mL | Freq: Two times a day (BID) | INTRAVENOUS | Status: DC
  Administered 2016-09-22 – 2016-09-24 (×5): 3 mL via INTRAVENOUS

## 2016-09-22 MED ORDER — POLYETHYLENE GLYCOL 3350 17 G PO PACK
17.0000 g | PACK | Freq: Every day | ORAL | Status: DC | PRN
Start: 2016-09-22 — End: 2016-09-25
  Administered 2016-09-23: 17 g via ORAL
  Filled 2016-09-22: qty 1

## 2016-09-22 MED ORDER — DULOXETINE HCL 60 MG PO CPEP
60.0000 mg | ORAL_CAPSULE | Freq: Every day | ORAL | Status: DC
  Administered 2016-09-23 – 2016-10-02 (×9): 60 mg via ORAL
  Filled 2016-09-22 (×9): qty 1

## 2016-09-22 MED ORDER — ENSURE ENLIVE PO LIQD
237.0000 mL | Freq: Two times a day (BID) | ORAL | Status: DC
  Administered 2016-09-23: 237 mL via ORAL

## 2016-09-22 MED ORDER — VITAMIN C 500 MG PO TABS
1000.0000 mg | ORAL_TABLET | Freq: Every day | ORAL | Status: DC
  Administered 2016-09-23 – 2016-10-02 (×9): 1000 mg via ORAL
  Filled 2016-09-22 (×9): qty 2

## 2016-09-22 MED ORDER — SIMVASTATIN 20 MG PO TABS
20.0000 mg | ORAL_TABLET | Freq: Every day | ORAL | Status: DC
  Administered 2016-09-23 – 2016-09-26 (×4): 20 mg via ORAL
  Filled 2016-09-22 (×4): qty 1

## 2016-09-22 MED ORDER — POTASSIUM CHLORIDE CRYS ER 20 MEQ PO TBCR: 20.0000 meq | EXTENDED_RELEASE_TABLET | Freq: Every day | ORAL | Status: DC

## 2016-09-22 MED ORDER — ACETAMINOPHEN 650 MG RE SUPP: 650.0000 mg | Freq: Four times a day (QID) | RECTAL | Status: DC | PRN

## 2016-09-22 MED ORDER — GLIPIZIDE 5 MG PO TABS
5.0000 mg | ORAL_TABLET | Freq: Every day | ORAL | Status: DC
  Administered 2016-09-23 – 2016-09-24 (×2): 5 mg via ORAL
  Filled 2016-09-22 (×2): qty 1

## 2016-09-22 MED ORDER — SODIUM CHLORIDE 0.9 % IV SOLN: 250.0000 mL | INTRAVENOUS | Status: DC | PRN

## 2016-09-22 MED ORDER — MONTELUKAST SODIUM 10 MG PO TABS
10.0000 mg | ORAL_TABLET | Freq: Every day | ORAL | Status: DC
  Administered 2016-09-22 – 2016-10-01 (×10): 10 mg via ORAL
  Filled 2016-09-22 (×11): qty 1

## 2016-09-22 MED ORDER — HEPARIN (PORCINE) IN NACL 100-0.45 UNIT/ML-% IJ SOLN
1450.0000 [IU]/h | INTRAMUSCULAR | Status: DC
  Administered 2016-09-22: 1200 [IU]/h via INTRAVENOUS
  Filled 2016-09-22 (×2): qty 250

## 2016-09-22 MED ORDER — SODIUM CHLORIDE 0.9% FLUSH
3.0000 mL | Freq: Two times a day (BID) | INTRAVENOUS | Status: DC
  Administered 2016-09-24: 3 mL via INTRAVENOUS

## 2016-09-22 MED ORDER — PANTOPRAZOLE SODIUM 40 MG PO TBEC
40.0000 mg | DELAYED_RELEASE_TABLET | Freq: Every day | ORAL | Status: DC
  Administered 2016-09-23 – 2016-10-02 (×9): 40 mg via ORAL
  Filled 2016-09-22 (×9): qty 1

## 2016-09-22 MED ORDER — IPRATROPIUM-ALBUTEROL 0.5-2.5 (3) MG/3ML IN SOLN
3.0000 mL | Freq: Every day | RESPIRATORY_TRACT | Status: DC
  Administered 2016-09-23: 3 mL via RESPIRATORY_TRACT
  Filled 2016-09-22: qty 3

## 2016-09-22 MED ORDER — SODIUM CHLORIDE 0.9% FLUSH: 3.0000 mL | INTRAVENOUS | Status: DC | PRN

## 2016-09-22 MED ORDER — FLUTICASONE PROPIONATE 50 MCG/ACT NA SUSP
2.0000 | Freq: Every day | NASAL | Status: DC
  Administered 2016-09-22 – 2016-10-02 (×10): 2 via NASAL
  Filled 2016-09-22: qty 16

## 2016-09-22 MED ORDER — IOPAMIDOL (ISOVUE-370) INJECTION 76%
INTRAVENOUS | Status: AC
  Filled 2016-09-22: qty 100

## 2016-09-22 MED ORDER — FOLIC ACID 1 MG PO TABS
1.0000 mg | ORAL_TABLET | Freq: Every day | ORAL | Status: DC
  Administered 2016-09-23 – 2016-10-02 (×9): 1 mg via ORAL
  Filled 2016-09-22 (×10): qty 1

## 2016-09-22 MED ORDER — FUROSEMIDE 10 MG/ML IJ SOLN
20.0000 mg | Freq: Two times a day (BID) | INTRAMUSCULAR | Status: DC
  Administered 2016-09-22 – 2016-09-23 (×3): 20 mg via INTRAVENOUS
  Filled 2016-09-22 (×3): qty 2

## 2016-09-22 NOTE — H&P (Signed)
[facility] History and Physical  MAYGEN Buchanan JME:[id number] DOB: [birth year] DOA: 09/22/2016  Referring physician: Dr Thomasene Lot PCP: Andria Frames, MD   Chief Complaint: Dyspnea  HPI: Carol Buchanan is a 56 y.o. 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  Was treated with XRT initially and has been on chemoRx since then, f/b Dr Marin Olp.  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  Presenting with one month hx worsening SOB.  CT angio of chest in ED today showed bilat segmental and subsegmental pulm emboli, no R heart strain, mild CHF, pericardial effusion.  CXR shows chronic scarring left chest, L effusion. Asked to see for admission.   Denies any CP, fevers, prod cough, no abd pain, no n/v/d.  No hx GIB except one time bleeding hemorrhoid.  Has had anemia recently and Dr Marin Olp did a rectal exam per pt 1 week ago and it was "negative" for blood.  Lives with her mother who is in good health, mother would be decision maker if pt weren't able.  Hx COPD, quit smoking 6 yrs ago.  Never married, no children.  3 sibs living, sister is here tonight.  Grew up in [place], went to [employer].  Got her GED, has had multiple varied jobs.  Is on home O2 at 4L, on home O2 > 1 yr.     ROS  denies CP  no joint pain   no HA  no blurry vision  no rash  no diarrhea  no nausea/ vomiting  no dysuria  no difficulty voiding  no change in urine color    Past Medical History  Past Medical History:  Diagnosis Date  . COPD (chronic obstructive pulmonary disease) ([place])   . DM (diabetes mellitus), type 2, uncontrolled ([place]) 11/26/2015  . Hyperlipidemia   . Hypertension   . Neuropathy ([place])   . Primary lung cancer with metastasis from lung to other site [facility]) 07/26/2015  . 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 Sinus trouble    Past Surgical History  Past Surgical History:  Procedure Laterality Date  . BACK SURGERY   2015  . COLONOSCOPY Left 12/28/2015   Procedure: COLONOSCOPY;  Surgeon: Wilford Corner, MD;  Location: [facility];  Service: Endoscopy;  Laterality: Left;  . ESOPHAGOGASTRODUODENOSCOPY N/A 12/28/2015   Procedure: ESOPHAGOGASTRODUODENOSCOPY (EGD);  Surgeon: Wilford Corner, MD;  Location: [facility];  Service: Endoscopy;  Laterality: N/A;  . TUBAL LIGATION  1994  . VIDEO BRONCHOSCOPY Bilateral 07/01/2015   Procedure: VIDEO BRONCHOSCOPY WITHOUT FLUORO;  Surgeon: Collene Gobble, MD;  Location: [facility];  Service: Cardiopulmonary;  Laterality: Bilateral;   Family History  Family History  Problem Relation Age of Onset  . Heart disease Father   . Heart attack Father   . Arthritis Mother    Social History  reports that she quit smoking about 5 years ago. Her smoking use included Cigarettes. She has a 52.50 pack-year smoking history. She has never used smokeless tobacco. She reports that she drinks about 1.2 oz of alcohol per week . She reports that she does not use drugs. Allergies No Known Allergies Home medications Prior to Admission medications   Medication Sig Start Date End Date Taking? Authorizing Provider  Ascorbic Acid (VITAMIN C) 1000 MG tablet Take 1,000 mg by mouth daily.   Yes Historical Provider, MD  Calcium Carbonate-Vitamin D (CALCIUM-VITAMIN D) 500-200 MG-UNIT tablet Take 1  tablet by mouth daily.   Yes Historical Provider, MD  DULoxetine (CYMBALTA) 60 MG capsule Take 60 mg by mouth daily. Reported on 08/06/2015 08/02/15  Yes Historical Provider, MD  fluticasone (FLONASE) 50 MCG/ACT nasal spray Place 2 sprays into both nostrils daily. 01/30/16  Yes Collene Gobble, MD  folic acid (FOLVITE) 1 MG tablet Take 1 tablet (1 mg total) by mouth daily. Start 5-7 days before Alimta chemotherapy. Continue until 21 days after Alimta completed. 01/06/16  Yes Volanda Napoleon, MD  glipiZIDE (GLUCOTROL) 5 MG tablet Take 1 tablet (5 mg total) by mouth 2 (two) times daily before a meal. Patient  taking differently: Take 5 mg by mouth daily before breakfast.  12/02/15  Yes Debbe Odea, MD  ipratropium-albuterol (DUONEB) 0.5-2.5 (3) MG/3ML SOLN Take 3 mLs by nebulization 3 (three) times daily. Patient taking differently: Take 3 mLs by nebulization daily.  12/28/15  Yes Chauncey Cruel, MD  lidocaine-prilocaine (EMLA) cream Apply to affected area once 01/06/16  Yes Volanda Napoleon, MD  LORazepam (ATIVAN) 0.5 MG tablet take 1 tablet by mouth every 6 hours if needed for nausea or vomiting 07/06/16  Yes Volanda Napoleon, MD  meloxicam (MOBIC) 15 MG tablet Take 15 mg by mouth every other day.   Yes Historical Provider, MD  montelukast (SINGULAIR) 10 MG tablet take 1 tablet by mouth at bedtime 09/04/16  Yes Eliezer Bottom, NP  omeprazole (PRILOSEC) 20 MG capsule Take 1 capsule (20 mg total) by mouth daily. 12/02/15  Yes Debbe Odea, MD  ondansetron (ZOFRAN) 4 MG tablet take 1 tablet by mouth once daily 07/21/16  Yes Volanda Napoleon, MD  polyethylene glycol powder (GLYCOLAX/MIRALAX) powder take 17GM (DISSOLVED IN WATER) by mouth twice a day 08/05/16  Yes Eliezer Bottom, NP  potassium chloride SA (K-DUR,KLOR-CON) 20 MEQ tablet Take 1 tablet (20 mEq total) by mouth daily. 08/24/16  Yes Volanda Napoleon, MD  prochlorperazine (COMPAZINE) 10 MG tablet take 1 tablet by mouth every 6 hours if needed for nausea or vomiting 08/26/16  Yes Volanda Napoleon, MD  simvastatin (ZOCOR) 20 MG tablet Take 20 mg by mouth daily.    Yes Historical Provider, MD  STIOLTO RESPIMAT 2.5-2.5 MCG/ACT AERS inhale 2 puffs INTO THE LUNGS DAILY 07/28/16  Yes Collene Gobble, MD  triamterene-hydrochlorothiazide (MAXZIDE) 75-50 MG tablet Take 1 tablet by mouth daily. 07/13/16  Yes Volanda Napoleon, MD  verapamil (COVERA HS) 180 MG (CO) 24 hr tablet Take 180 mg by mouth daily.    Yes Historical Provider, MD   Liver Function Tests  Recent Labs Lab 09/22/16 1636  AST 40  ALT 41  ALKPHOS 157*  BILITOT 0.5  PROT 7.2  ALBUMIN 3.2*    No results for input(s): LIPASE, AMYLASE in the last 168 hours. CBC  Recent Labs Lab 09/22/16 1636  WBC 1.6*  NEUTROABS 0.8*  HGB 8.0*  HCT 23.8*  MCV 97.9  PLT 619*   Basic Metabolic Panel  Recent Labs Lab 09/22/16 1636  NA 134*  K 2.9*  CL 93*  CO2 31  GLUCOSE 89  BUN 20  CREATININE 1.16*  CALCIUM 8.9     Vitals:   09/22/16 1527 09/22/16 1527 09/22/16 1736  BP:  126/76 119/63  Pulse:  113 97  Resp:  22 24  Temp:  97.9 F (36.6 C)   TempSrc:  Oral   SpO2:  92% 96%  Weight: 81.6 kg (180 lb)    Height: 5'  4" (1.626 m)     Exam: Gen pleasant adult WF, not in distress, nasal O2 No rash, cyanosis or gangrene Sclera anicteric, throat clear  No jvd or bruits Chest R lung clear, L lung dec'd with rales at the base RRR w soft SEM, tachy, no S3/ rub Abd soft ntnd no mass or ascites +bs GU defer MS no joint effusions or deformity Ext 1+ bilat pretib edema / no wounds or ulcers Neuro is alert, Ox 3 , nf  Na 134 K 2.9  BUn 20  Cr 1.16  eGFR 60   Ca 8.9   alkphos 157   LFT's ok BNP wnl  Trop 0.0 WBC 1.6   Hb 8   plt 134   CT chest >>> 1) Acute bilateral lower lobe segmental and subsegmental nonocclusive pulmonary emboli without right heart strain. RV/ LV ratio equals 0.67. Cardiomegaly with circumferential pericardial effusion measuring up to 3.1 cm in thickness no aortic aneurysm or dissection.Marland KitchenMarland Kitchen 2) Lungs/Pleura: Moderate loculated left pleural effusion with adjacent areas of pulmonary atelectasis and consolidation. Small right pleural effusion. Left hilar masslike opacity noted on prior exam is obscured by adjacent atelectasis and pleural fluid centrilobular emphysema is identified upper lobe predominant. Hazy ground-glass opacities of the lungs may reflect sequela of CHF. 3) Musculoskeletal: Sclerotic appearance of the T4 vertebral body concerning for osseous metastatic disease. Degenerative disc space narrowing from T3 through T8 with small  posterior marginal osteophytes at T5-6 and T6-7. Slightly sclerotic appearance of the C6 and C7 vertebral bodies are also noted.  EKG (independ reviewed) > sinus tach, low voltage precordial leads, prolonged QTc 530 msec CXR (independ reviewed) > Areas of consolidation on the left, primarily in the left upper lobe, posterior segment. Loculated pleural effusion on the left with atelectatic change. Atelectasis right perihilar region.   Assessment: 1. Pulm emboli - presumably acute but SOB has been progressive for a month.  Also w underlying COPD, probably some degree of CHF (LE edema, pleural effusions) as well.  No sign PNA.   2. Dyspnea - multifactorial, COPD/ pneumonitis (prob from Keytruda/ immunomodulator)/ poss CHF/ PE 3. NSCCa of lung - as above, getting q 3 wk chemo per port, Alimta (pemetrexed) only,it appears that carboplat on hold due to allergic rxn , and had side effects from [facility] 4. HTN - on verapamil and Maxzide, will hold for now 5. HL 6. DM not on medication 7. Leukopenia - not febrile, due to chemoRx (pemetrexed) most likely 8. Pericardial effusion - unchanged from prior, no rub, no hypotension, observe  Plan - admit, IV heparin, cont home meds, home BP meds, nasal O2, tele, give low dose lasix IV for vol ^, doppler LE's in am, Onc input if WBC doesn't improve soon . Extra KCL po.      Roney Jaffe D [facility] Pager [phone]   If 7PM-7AM, please contact night-coverage [email or web address] Password [email or web address] 09/22/2016, 7:30 PM

## 2016-09-22 NOTE — Progress Notes (Signed)
ANTICOAGULATION CONSULT NOTE - Initial Consult  Pharmacy Consult for Heparin Indication: pulmonary embolus  No Known Allergies  Patient Measurements: Height: '5\' 4"'$  (162.6 cm) Weight: 180 lb (81.6 kg) IBW/kg (Calculated) : 54.7 Heparin Dosing Weight: 72.3 kg  Vital Signs: Temp: 97.9 F (36.6 C) (02/27 1527) Temp Source: Oral (02/27 1527) BP: 119/63 (02/27 1736) Pulse Rate: 97 (02/27 1736)  Labs:  Recent Labs  09/22/16 1636  HGB 8.0*  HCT 23.8*  PLT 134*  APTT 48*  LABPROT 14.8  INR 1.15  CREATININE 1.16*    Estimated Creatinine Clearance: 56 mL/min (by C-G formula based on SCr of 1.16 mg/dL (H)).   Medical History: Past Medical History:  Diagnosis Date  . COPD (chronic obstructive pulmonary disease) ([place])   . DM (diabetes mellitus), type 2, uncontrolled ([place]) 11/26/2015  . Hyperlipidemia   . Hypertension   . Neuropathy ([place])   . Primary lung cancer with metastasis from lung to other site [facility]) 07/26/2015  . 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 Sinus trouble      Assessment: 86 y/oF with PMH of lung cancer (last dose of Alimta on 09/14/16), anemia (s/p blood transfusion last week), COPD, DM, HLD, HTN who presents to [facility] ED with worsening shortness of breath. CTa of chest + for PE. Pharmacy consulted to start heparin infusion. Patient not on any anticoagulants PTA. CBC reveals low Hgb at 8, low Pltc at 134K. PT/INR WNL, aPTT elevated at 48 seconds. No active bleeding reported.  Goal of Therapy:  Heparin level 0.3-0.7 units/ml Monitor platelets by anticoagulation protocol: Yes   Plan:   Heparin 2000 units IV bolus x 1, then start heparin infusion at 1200 units/hr.  Heparin level 6 hours after heparin infusion started.  Daily CBC and heparin level while on heparin infusion.  Monitor for s/sx of bleeding.    Lindell Spar, PharmD, BCPS Pager: [phone] 09/22/2016 7:15 PM

## 2016-09-22 NOTE — ED Notes (Signed)
Bed: GG83 Expected date:  Expected time:  Means of arrival:  Comments: 56 yo ab pain, nausea, cbg ^600, alcohol intoxication

## 2016-09-22 NOTE — ED Provider Notes (Signed)
[facility] DEPT Provider Note   CSN: [id number] Arrival date & time: 09/22/16  1509     History   Chief Complaint Chief Complaint  Patient presents with  . Shortness of Breath    HPI Carol Buchanan is a 56 y.o. female.  HPI   Patient is a 57 year old female with lung cancer followed by Dr. Jonette Eva. Patient's last treatment was 8 days ago. Patient is on chemotherapy. Pt had anemia diagnosed last week, given transfusion but has not appreciable had symtpoms improve.  Today she had further wrosening of symptoms (SOB) and called EMS. On home 4L.   Ho pericardial effusion with no phsysiologic effect.      Past Medical History:  Diagnosis Date  . COPD (chronic obstructive pulmonary disease) ([place])   . DM (diabetes mellitus), type 2, uncontrolled ([place]) 11/26/2015  . Hyperlipidemia   . Hypertension   . Neuropathy ([place])   . Primary lung cancer with metastasis from lung to other site [facility]) 07/26/2015  . 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 Sinus trouble     Patient Active Problem List   Diagnosis Date Noted  . Pericardial effusion 08/04/2016  . Lower GI bleed 12/27/2015  . Chronic respiratory failure ([place]) 12/13/2015  . Hyperglycemia due to steroids 12/02/2015  . Pneumonitis   . DM (diabetes mellitus), type 2, uncontrolled ([place]) 11/26/2015  . Essential hypertension 11/26/2015  . Acute hypoxemic respiratory failure ([place]) 11/25/2015  . 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 Primary lung cancer with metastasis from lung to other site [facility]) 07/26/2015  . Focal atelectasis 06/25/2015  . Neuropathy ([place])   . Spinal stenosis of lumbar region 07/10/2013  . Spinal stenosis in cervical region 07/10/2013  . COPD (chronic obstructive pulmonary disease) ([place]) 12/09/2012  . Dyspnea 11/07/2012  . Seasonal allergies 11/07/2012    Past Surgical History:  Procedure Laterality Date  . BACK SURGERY  2015  .  COLONOSCOPY Left 12/28/2015   Procedure: COLONOSCOPY;  Surgeon: Wilford Corner, MD;  Location: [facility];  Service: Endoscopy;  Laterality: Left;  . ESOPHAGOGASTRODUODENOSCOPY N/A 12/28/2015   Procedure: ESOPHAGOGASTRODUODENOSCOPY (EGD);  Surgeon: Wilford Corner, MD;  Location: [facility];  Service: Endoscopy;  Laterality: N/A;  . TUBAL LIGATION  1994  . VIDEO BRONCHOSCOPY Bilateral 07/01/2015   Procedure: VIDEO BRONCHOSCOPY WITHOUT FLUORO;  Surgeon: Collene Gobble, MD;  Location: [facility];  Service: Cardiopulmonary;  Laterality: Bilateral;    OB History    No data available       Home Medications    Prior to Admission medications   Medication Sig Start Date End Date Taking? Authorizing Provider  Ascorbic Acid (VITAMIN C) 1000 MG tablet Take 1,000 mg by mouth daily.    Historical Provider, MD  Calcium Carbonate-Vitamin D (CALCIUM-VITAMIN D) 500-200 MG-UNIT tablet Take 1 tablet by mouth daily.    Historical Provider, MD  DULoxetine (CYMBALTA) 60 MG capsule Take 60 mg by mouth daily. Reported on 08/06/2015 08/02/15   Historical Provider, MD  fluticasone (FLONASE) 50 MCG/ACT nasal spray Place 2 sprays into both nostrils daily. 01/30/16   Collene Gobble, MD  folic acid (FOLVITE) 1 MG tablet Take 1 tablet (1 mg total) by mouth daily. Start 5-7 days before Alimta chemotherapy. Continue until 21 days after Alimta completed. 01/06/16   Volanda Napoleon, MD  glipiZIDE (GLUCOTROL) 5 MG tablet Take 1 tablet (5 mg total) by mouth 2 (two) times daily before a meal. 12/02/15  Debbe Odea, MD  ipratropium-albuterol (DUONEB) 0.5-2.5 (3) MG/3ML SOLN Take 3 mLs by nebulization 3 (three) times daily. Patient taking differently: Take 3 mLs by nebulization daily.  12/28/15   Chauncey Cruel, MD  lidocaine-prilocaine (EMLA) cream Apply to affected area once 01/06/16   Volanda Napoleon, MD  LORazepam (ATIVAN) 0.5 MG tablet take 1 tablet by mouth every 6 hours if needed for nausea or vomiting 07/06/16   Volanda Napoleon, MD  meloxicam (MOBIC) 15 MG tablet Take 15 mg by mouth every other day.    Historical Provider, MD  montelukast (SINGULAIR) 10 MG tablet take 1 tablet by mouth at bedtime 09/04/16   Eliezer Bottom, NP  omeprazole (PRILOSEC) 20 MG capsule Take 1 capsule (20 mg total) by mouth daily. 12/02/15   Debbe Odea, MD  ondansetron (ZOFRAN) 4 MG tablet take 1 tablet by mouth once daily 07/21/16   Volanda Napoleon, MD  polyethylene glycol powder [facility]) powder take 17GM (DISSOLVED IN WATER) by mouth twice a day 08/05/16   Eliezer Bottom, NP  potassium chloride SA (K-DUR,KLOR-CON) 20 MEQ tablet Take 1 tablet (20 mEq total) by mouth daily. 08/24/16   Volanda Napoleon, MD  prochlorperazine (COMPAZINE) 10 MG tablet take 1 tablet by mouth every 6 hours if needed for nausea or vomiting 08/26/16   Volanda Napoleon, MD  simvastatin (ZOCOR) 20 MG tablet Take 20 mg by mouth daily.     Historical Provider, MD  STIOLTO RESPIMAT 2.5-2.5 MCG/ACT AERS inhale 2 puffs INTO THE LUNGS DAILY 07/28/16   Collene Gobble, MD  triamterene-hydrochlorothiazide (MAXZIDE) 75-50 MG tablet Take 1 tablet by mouth daily. 07/13/16   Volanda Napoleon, MD  verapamil (COVERA HS) 180 MG (CO) 24 hr tablet Take 180 mg by mouth daily.     Historical Provider, MD    Family History Family History  Problem Relation Age of Onset  . Heart disease Father   . Heart attack Father   . Arthritis Mother     Social History Social History  Substance Use Topics  . Smoking status: Former Smoker    Packs/day: 1.50    Years: 35.00    Types: Cigarettes    Quit date: 01/26/2011  . Smokeless tobacco: Never Used  . Alcohol use 1.2 oz/week    2 Standard drinks or equivalent per week     Comment: on weekends     Allergies   Patient has no known allergies.   Review of Systems Review of Systems  Constitutional: Positive for fatigue. Negative for activity change and fever.  HENT: Negative for congestion and ear pain.   Respiratory:  Positive for cough, chest tightness and shortness of breath.   Gastrointestinal: Negative for abdominal pain.  Neurological: Positive for weakness. Negative for dizziness, numbness and headaches.  All other systems reviewed and are negative.    Physical Exam Updated Vital Signs BP 126/76 (BP Location: Right Arm)   Pulse 113   Temp 97.9 F (36.6 C) (Oral)   Resp 22   Ht '5\' 4"'$  (1.626 m)   Wt 180 lb (81.6 kg)   SpO2 92%   BMI 30.90 kg/m   Physical Exam  Constitutional: She is oriented to person, place, and time. She appears well-developed and well-nourished.  HENT:  Head: Normocephalic and atraumatic.  Eyes: Right eye exhibits no discharge.  Edema around eyes  Cardiovascular:  tachycardia  Pulmonary/Chest: No respiratory distress. She has no wheezes.  tachpnic on 4L L lung  sounds distant.  Musculoskeletal: Normal range of motion. She exhibits edema.  + 1 edema bilatearl LE  Neurological: She is oriented to person, place, and time.  Skin: Skin is warm and dry. She is not diaphoretic.  Psychiatric: She has a normal mood and affect.  Nursing note and vitals reviewed.    ED Treatments / Results  Labs (all labs ordered are listed, but only abnormal results are displayed) Labs Reviewed  CBC WITH DIFFERENTIAL/PLATELET  COMPREHENSIVE METABOLIC PANEL  PROTIME-INR  BRAIN NATRIURETIC PEPTIDE  I-STAT CG4 LACTIC ACID, ED  I-STAT [place], ED    EKG  EKG Interpretation  Date/Time:  Tuesday September 22 2016 15:27:27 EST Ventricular Rate:  113 PR Interval:    QRS Duration: 79 QT Interval:  379 QTC Calculation: 520 R Axis:   96 Text Interpretation:  Sinus or ectopic atrial tachycardia Borderline right axis deviation Low voltage, precordial leads Nonspecific T abnormalities, lateral leads Prolonged QT interval no evidence of acute ischemia No significant change since last tracing Confirmed by Gerald Leitz ([id number]) on 09/22/2016 4:12:22 PM       Radiology Dg Chest 2  View  Result Date: 09/22/2016 CLINICAL DATA:  Shortness of breath.  History of lung carcinoma EXAM: CHEST  2 VIEW COMPARISON:  Chest radiograph Dec 24, 2015 and PET-CT July 06, 2016 FINDINGS: There is loculated pleural effusion on the left with atelectasis in left base. There is consolidation in the left upper lobe posterior segment as well as in portions of the left lower lobe medially. On the right, there is atelectatic change in the medial right base region. Lungs elsewhere clear. There is mild cardiomegaly with pulmonary vascularity within normal limits. No adenopathy. No bone lesions appreciable. Port-A-Cath tip at cavoatrial junction. No pneumothorax. IMPRESSION: Areas of consolidation on the left, primarily in the left upper lobe, posterior segment. Loculated pleural effusion on the left with atelectatic change. Atelectasis right perihilar region. Stable cardiomegaly. No adenopathy by radiography apparent. No pneumothorax. Electronically Signed   By: Lowella Grip III M.D.   On: 09/22/2016 15:47    Procedures Procedures (including critical care time)  Medications Ordered in ED Medications - No data to display   Initial Impression / Assessment and Plan / ED Course  I have reviewed the triage vital signs and the nursing notes.  Pertinent labs & imaging results that were available during my care of the patient were reviewed by me and considered in my medical decision making (see chart for details).     Pt is a 56 yo with ho lung cancer folo9owed by [facility] here with SOB. Concern for progression of lung ca vs PE vs pericardial effusion for cause. Will get CXR, CT angio, labs.   CT angios shows segmental subsegmental pulmonary emboli. Additionally shows pericardial effusion. It is difficult to tell but appears similar to pericardial effusion that is documented in both December and January of this year. In addition shows loculated effusion left upper lung.  I thus think that her  shortness of breath is likely multifactorial. Will admit to the hospitalist for echo in the morning, heparin.  CRITICAL CARE Performed by: Gardiner Sleeper Total critical care time: 60 minutes Critical care time was exclusive of separately billable procedures and treating other patients. Critical care was necessary to treat or prevent imminent or life-threatening deterioration. Critical care was time spent personally by me on the following activities: development of treatment plan with patient and/or surrogate as well as nursing, discussions with consultants, evaluation of patient's  response to treatment, examination of patient, obtaining history from patient or surrogate, ordering and performing treatments and interventions, ordering and review of laboratory studies, ordering and review of radiographic studies, pulse oximetry and re-evaluation of patient's condition.   Final Clinical Impressions(s) / ED Diagnoses   Final diagnoses:  None    New Prescriptions New Prescriptions   No medications on file     Mikael Skoda Julio Alm, MD 09/22/16 2342

## 2016-09-22 NOTE — Telephone Encounter (Signed)
Patient's mother calling the office to inform us that she is having a hard time breathing. She is known to have shortness of breath, and is dependant on 4L O2, however the mother states it's gotten significantly worse where she is unable to participate in ADL's. The patient states that she's tired and thinks she needs to go to the hospital.  Instructed mother to bring patient to the ED. Mother agrees and will bring patient to the [facility] ED today.

## 2016-09-22 NOTE — ED Triage Notes (Signed)
Per EMS, pt complains of shortness of breath, lower leg edema, faicial swelling. Pt has hx of left lobe lung cancer and COPD, is on 4L O2 at home. Pt sats 93% on 4L.   Pt received chemo 8 days ago and 2 units of blood last week.  Pt denies pain.

## 2016-09-23 ENCOUNTER — Inpatient Hospital Stay (HOSPITAL_COMMUNITY): Payer: Medicare Other

## 2016-09-23 ENCOUNTER — Telehealth: Payer: Self-pay | Admitting: Emergency Medicine

## 2016-09-23 DIAGNOSIS — C3492 Malignant neoplasm of unspecified part of left bronchus or lung: Secondary | ICD-10-CM

## 2016-09-23 DIAGNOSIS — Z86711 Personal history of pulmonary embolism: Secondary | ICD-10-CM

## 2016-09-23 DIAGNOSIS — R000 Tachycardia, unspecified: Secondary | ICD-10-CM

## 2016-09-23 DIAGNOSIS — I313 Pericardial effusion (noninflammatory): Principal | ICD-10-CM

## 2016-09-23 DIAGNOSIS — Z7901 Long term (current) use of anticoagulants: Secondary | ICD-10-CM

## 2016-09-23 DIAGNOSIS — I2699 Other pulmonary embolism without acute cor pulmonale: Secondary | ICD-10-CM

## 2016-09-23 LAB — CBC WITH DIFFERENTIAL/PLATELET
Basophils Absolute: 0 10*3/uL (ref 0.0–0.1)
Basophils Relative: 0 %
EOS PCT: 4 %
Eosinophils Absolute: 0.1 10*3/uL (ref 0.0–0.7)
HCT: 23.8 % — ABNORMAL LOW (ref 36.0–46.0)
Hemoglobin: 8.1 g/dL — ABNORMAL LOW (ref 12.0–15.0)
Lymphocytes Relative: 23 %
Lymphs Abs: 0.3 10*3/uL — ABNORMAL LOW (ref 0.7–4.0)
MCH: 34 pg (ref 26.0–34.0)
MCHC: 34 g/dL (ref 30.0–36.0)
MCV: 100 fL (ref 78.0–100.0)
MONO ABS: 0.4 10*3/uL (ref 0.1–1.0)
MONOS PCT: 28 %
NEUTROS ABS: 0.7 10*3/uL — AB (ref 1.7–7.7)
Neutrophils Relative %: 45 %
PLATELETS: 127 10*3/uL — AB (ref 150–400)
RBC: 2.38 MIL/uL — AB (ref 3.87–5.11)
RDW: 17.1 % — AB (ref 11.5–15.5)
WBC: 1.5 10*3/uL — AB (ref 4.0–10.5)

## 2016-09-23 LAB — BASIC METABOLIC PANEL
Anion gap: 13 (ref 5–15)
BUN: 19 mg/dL (ref 6–20)
CO2: 27 mmol/L (ref 22–32)
CREATININE: 1.34 mg/dL — AB (ref 0.44–1.00)
Calcium: 8.8 mg/dL — ABNORMAL LOW (ref 8.9–10.3)
Chloride: 94 mmol/L — ABNORMAL LOW (ref 101–111)
GFR calc Af Amer: 50 mL/min — ABNORMAL LOW (ref >60)
GFR, EST NON AFRICAN AMERICAN: 43 mL/min — AB (ref >60)
GLUCOSE: 111 mg/dL — AB (ref 65–99)
Potassium: 3 mmol/L — ABNORMAL LOW (ref 3.5–5.1)
SODIUM: 134 mmol/L — AB (ref 135–145)

## 2016-09-23 LAB — HEPARIN LEVEL (UNFRACTIONATED): Heparin Unfractionated: <0.1 IU/mL — ABNORMAL LOW (ref 0.30–0.70)

## 2016-09-23 MED ORDER — HEPARIN BOLUS VIA INFUSION
2000.0000 [IU] | Freq: Once | INTRAVENOUS | Status: DC
  Filled 2016-09-23: qty 2000

## 2016-09-23 MED ORDER — HEPARIN (PORCINE) IN NACL 100-0.45 UNIT/ML-% IJ SOLN: 2000.0000 [IU]/h | INTRAMUSCULAR | Status: DC

## 2016-09-23 MED ORDER — HYDROCODONE-ACETAMINOPHEN 7.5-325 MG PO TABS
1.0000 | ORAL_TABLET | Freq: Four times a day (QID) | ORAL | Status: DC | PRN
  Administered 2016-09-23 – 2016-09-27 (×6): 2 via ORAL
  Filled 2016-09-23 (×7): qty 2

## 2016-09-23 MED ORDER — SODIUM CHLORIDE 0.9% FLUSH
10.0000 mL | INTRAVENOUS | Status: DC | PRN
  Administered 2016-09-24: 10 mL
  Filled 2016-09-23: qty 40

## 2016-09-23 MED ORDER — IPRATROPIUM-ALBUTEROL 0.5-2.5 (3) MG/3ML IN SOLN
3.0000 mL | Freq: Three times a day (TID) | RESPIRATORY_TRACT | Status: DC
  Administered 2016-09-24 – 2016-09-27 (×8): 3 mL via RESPIRATORY_TRACT
  Filled 2016-09-23 (×10): qty 3

## 2016-09-23 MED ORDER — RIVAROXABAN 15 MG PO TABS
15.0000 mg | ORAL_TABLET | Freq: Once | ORAL | Status: AC
  Administered 2016-09-23: 15 mg via ORAL
  Filled 2016-09-23: qty 1

## 2016-09-23 MED ORDER — RIVAROXABAN 15 MG PO TABS
15.0000 mg | ORAL_TABLET | Freq: Two times a day (BID) | ORAL | Status: DC
  Administered 2016-09-23: 15 mg via ORAL
  Filled 2016-09-23: qty 1

## 2016-09-23 MED ORDER — ENSURE ENLIVE PO LIQD: 237.0000 mL | Freq: Two times a day (BID) | ORAL | Status: DC | PRN

## 2016-09-23 MED ORDER — IPRATROPIUM-ALBUTEROL 0.5-2.5 (3) MG/3ML IN SOLN
3.0000 mL | Freq: Four times a day (QID) | RESPIRATORY_TRACT | Status: DC
  Administered 2016-09-23 (×2): 3 mL via RESPIRATORY_TRACT
  Filled 2016-09-23 (×2): qty 3

## 2016-09-23 NOTE — Progress Notes (Signed)
Initial Nutrition Assessment  DOCUMENTATION CODES:   Not applicable  INTERVENTION:  - Continue Ensure Enlive BID but will change to PRN, each supplement provides 350 kcal and 20 grams of protein - Continue to encourage PO intakes of meals.  - RD will continue to monitor for additional nutrition-related needs.  NUTRITION DIAGNOSIS:   Increased nutrient needs related to catabolic illness, cancer and cancer related treatments as evidenced by estimated needs.  GOAL:   Patient will meet greater than or equal to 90% of their needs  MONITOR:   PO intake, Weight trends, Labs, I & O's  REASON FOR ASSESSMENT:   Malnutrition Screening Tool  ASSESSMENT:   56 y.o. 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  Was treated with XRT initially and has been on chemoRx since then, f/b Dr Marin Olp.  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  Presenting with one month hx worsening SOB.  CT angio of chest in ED today showed bilat segmental and subsegmental pulm emboli, no R heart strain, mild CHF, pericardial effusion.  CXR shows chronic scarring left chest, L effusion.  Pt seen for MST. BMI indicates obesity. No intakes documented since admission. Pt reports that for breakfast she ordered an omelet and ate as much as she was able but that it was very large and she is still feeling full. She requested a cup of cherry jello for lunch; ordered by RD. She is wanting a piece of pizza for dinner. Pt states that she used to eat large quantities of food but that over the past few months this has decreased and she is now eating smaller portions. She denies abdominal pain now or PTA but states that bland taste and altered sensation with certain food textures has been the cause of decreased intakes. Pt feels that this is a beneficial thing for her. She denies chewing or swallowing issues with any items. Per notes, pt's last chemo was 8 days PTA (09/14/16). Pt denies any adverse side effects since that  time.  Physical assessment shows no muscle and no fat wasting; mild generalized edema present. Per chart review, pt has lost 16 lbs (8.4% body weight) in the past 5 months which is not significant for time frame. Pt confirms this weight loss. Pt does not meet criteria for malnutrition at this time.   Medications reviewed; 1 mg oral folic acid/day, 20 mg IV Lasix BID, 5 mg Glucotrol/day, 40 mg oral Protonix/day, 20 mEq oral KCl/day, 40 mEq oral KCl BID, 1000 mg ascorbic acid/day.  Labs reviewed; Na: 134 mmol/L, Cl: 94 mmol/L, creatinine: 1.34 mg/dL, Ca: 8.8 mg/dL, GFR: 43 mL/min.    Diet Order:  Diet Carb Modified Fluid consistency: Thin; Room service appropriate? Yes  Skin:  Reviewed, no issues  Last BM:  2/27  Height:   Ht Readings from Last 1 Encounters:  09/22/16 '5\' 4"'$  (1.626 m)    Weight:   Wt Readings from Last 1 Encounters:  09/23/16 184 lb 11.2 oz (83.8 kg)    Ideal Body Weight:  54.54 kg  BMI:  Body mass index is 31.7 kg/m.  Estimated Nutritional Needs:   Kcal:  2100-2345 (25-28 kcal/kg)  Protein:  100-117 grams (1.2-1.4 grams/kg)  Fluid:  >/= 2. L/day  EDUCATION NEEDS:   No education needs identified at this time    Jarome Matin, MS, RD, LDN, [facility] Inpatient Clinical Dietitian Pager # [phone] After hours/weekend pager # [phone]

## 2016-09-23 NOTE — Care Management Note (Signed)
Case Management Note  Patient Details  Name: Carol Buchanan MRN: [id number] Date of Birth: [birth year]  Subjective/Objective: 56 y/o f admitted w/PE. From home.                    Action/Plan:d/c plan home.   Expected Discharge Date:   (unknown)               Expected Discharge Plan:  Home/Self Care  In-House Referral:     Discharge planning Services  CM Consult  Post Acute Care Choice:    Choice offered to:     DME Arranged:    DME Agency:     HH Arranged:    HH Agency:     Status of Service:  In process, will continue to follow  If discussed at Long Length of Stay Meetings, dates discussed:    Additional Comments:  Dessa Phi, RN 09/23/2016, 1:42 PM

## 2016-09-23 NOTE — Telephone Encounter (Signed)
Spoke with pt's mother, Carol Buchanan. States that pt is currently at [facility]. Pt's mother had to call 911 due to pt not being able to catch her breath. Pt had CT done and it showed PE's and pt was started on heparin. Carol Buchanan became very upset and started crying while on the phone. Stated, "I really trust Dr. Lamonte Sakai and I wanted him to know what was going on with [name]."  RB - FYI.

## 2016-09-23 NOTE — Consult Note (Signed)
Referral MD  Reason for Referral: Bilateral pulmonary emboli; metastatic poorly differentiated of the lung; pericardial effusion   Chief Complaint  Patient presents with  . Shortness of Breath  : I have blood clots in my lung.  HPI: Carol Buchanan is well-known to me. She is a 56 year old white female. 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  She is on chronic oxygen.  She has been having some more difficulty with breathing. She has had GI bleeding in the past.  We last saw her on the 19th. She was having some breathing issues. Her hemoglobin was 8.7. We went ahead and transfused her. She just did not feel much better.  She got worse over the weekend. Her mom called the office on Tuesday. We told her to go to the emergency room since I was out of the office. She had a CT angiogram. This, unfortunately, showed bilateral nonocclusive segmental and some central emboli. There is no right heart strain. She had loculated left pleural effusion. She had a large pericardial effusion.  She was started on heparin.  She had Dopplers of her leg which were negative.  She is complaining of abdominal pain.  She was switched over to Xarelto.  She still feels short of breath. She gets dyspneic with minimal exertion.  She's had no fever.  Her labs show anemia with hemoglobin of 8.1. Her white cell count is 1.5. Her platelet count is 127. Her creatinine is 1.34.  She has not noted any obvious melena area she has had GI bleeding in the past.  Her appetite is decent. She's had no nausea or vomiting.  We are asked to see her to help with management.    Past Medical History:  Diagnosis Date  . COPD (chronic obstructive pulmonary disease) ([place])   . DM (diabetes mellitus), type 2, uncontrolled ([place]) 11/26/2015  . Hyperlipidemia   . Hypertension   .  Neuropathy ([place])   . Primary lung cancer with metastasis from lung to other site [facility]) 07/26/2015  . 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 Sinus trouble   :  Past Surgical History:  Procedure Laterality Date  . BACK SURGERY  2015  . COLONOSCOPY Left 12/28/2015   Procedure: COLONOSCOPY;  Surgeon: Wilford Corner, MD;  Location: [facility] ENDOSCOPY;  Service: Endoscopy;  Laterality: Left;  . ESOPHAGOGASTRODUODENOSCOPY N/A 12/28/2015   Procedure: ESOPHAGOGASTRODUODENOSCOPY (EGD);  Surgeon: Wilford Corner, MD;  Location: [facility];  Service: Endoscopy;  Laterality: N/A;  . TUBAL LIGATION  1994  . VIDEO BRONCHOSCOPY Bilateral 07/01/2015   Procedure: VIDEO BRONCHOSCOPY WITHOUT FLUORO;  Surgeon: Collene Gobble, MD;  Location: [facility];  Service: Cardiopulmonary;  Laterality: Bilateral;  :   Current Facility-Administered Medications:  .  0.9 %  sodium chloride infusion, 250 mL, Intravenous, PRN, Roney Jaffe, MD .  DULoxetine (CYMBALTA) DR capsule 60 mg, 60 mg, Oral, Daily, Roney Jaffe, MD, 60 mg at 09/23/16 2563 .  feeding supplement (ENSURE ENLIVE) (ENSURE ENLIVE) liquid 237 mL, 237 mL, Oral, BID BM PRN, Charlynne Cousins, MD .  fluticasone [facility]) 50 MCG/ACT nasal spray 2 spray, 2 spray, Each Nare, Daily, Roney Jaffe, MD, 2 spray at 09/23/16 [phone] .  folic acid (FOLVITE) tablet 1 mg, 1 mg, Oral, Daily, Roney Jaffe, MD, 1 mg at 09/23/16 0927 .  furosemide (LASIX)  injection 20 mg, 20 mg, Intravenous, Q12H, Roney Jaffe, MD, 20 mg at 09/23/16 1438 .  glipiZIDE (GLUCOTROL) tablet 5 mg, 5 mg, Oral, QAC breakfast, Roney Jaffe, MD, 5 mg at 09/23/16 0758 .  HYDROcodone-acetaminophen (NORCO) 7.5-325 MG per tablet 1-2 tablet, 1-2 tablet, Oral, Q6H PRN, Charlynne Cousins, MD .  ipratropium-albuterol (DUONEB) 0.5-2.5 (3) MG/3ML nebulizer solution 3 mL, 3 mL, Nebulization, Q6H, Charlynne Cousins, MD, 3 mL at 09/23/16  1400 .  montelukast (SINGULAIR) tablet 10 mg, 10 mg, Oral, QHS, Roney Jaffe, MD, 10 mg at 09/22/16 2230 .  pantoprazole (PROTONIX) EC tablet 40 mg, 40 mg, Oral, Daily, Roney Jaffe, MD, 40 mg at 09/23/16 8875 .  polyethylene glycol (MIRALAX / GLYCOLAX) packet 17 g, 17 g, Oral, Daily PRN, Roney Jaffe, MD, 17 g at 09/23/16 7972 .  potassium chloride SA (K-DUR,KLOR-CON) CR tablet 20 mEq, 20 mEq, Oral, Daily, Roney Jaffe, MD .  simvastatin (ZOCOR) tablet 20 mg, 20 mg, Oral, q1800, Roney Jaffe, MD, 20 mg at 09/23/16 1703 .  sodium chloride flush (NS) 0.9 % injection 10-40 mL, 10-40 mL, Intracatheter, PRN, Joseph Art, MD .  sodium chloride flush (NS) 0.9 % injection 3 mL, 3 mL, Intravenous, Q12H, Roney Jaffe, MD .  sodium chloride flush (NS) 0.9 % injection 3 mL, 3 mL, Intravenous, Q12H, Roney Jaffe, MD, 3 mL at 09/23/16 1000 .  sodium chloride flush (NS) 0.9 % injection 3 mL, 3 mL, Intravenous, PRN, Roney Jaffe, MD .  vitamin C (ASCORBIC ACID) tablet 1,000 mg, 1,000 mg, Oral, Daily, Roney Jaffe, MD, 1,000 mg at 09/23/16 8206:  . DULoxetine  60 mg Oral Daily  . fluticasone  2 spray Each Nare Daily  . folic acid  1 mg Oral Daily  . furosemide  20 mg Intravenous Q12H  . glipiZIDE  5 mg Oral QAC breakfast  . ipratropium-albuterol  3 mL Nebulization Q6H  . montelukast  10 mg Oral QHS  . pantoprazole  40 mg Oral Daily  . potassium chloride SA  20 mEq Oral Daily  . simvastatin  20 mg Oral q1800  . sodium chloride flush  3 mL Intravenous Q12H  . sodium chloride flush  3 mL Intravenous Q12H  . vitamin C  1,000 mg Oral Daily  :  No Known Allergies:  Family History  Problem Relation Age of Onset  . Heart disease Father   . Heart attack Father   . Arthritis Mother   :  Social History   Social History  . Marital status: Single    Spouse name: N/A  . Number of children: 0  . Years of education: 35   Occupational History  . unemployed [employer]    Social History  Main Topics  . Smoking status: Former Smoker    Packs/day: 1.50    Years: 35.00    Types: Cigarettes    Quit date: 01/26/2011  . Smokeless tobacco: Never Used  . Alcohol use 1.2 oz/week    2 Standard drinks or equivalent per week     Comment: on weekends  . Drug use: No  . Sexual activity: No   Other Topics Concern  . Not on file   Social History Narrative   Patient lives at home with her mother.   Patient is not working   Right handed   Education- One year of college  :  Pertinent items are noted in HPI.  Exam: Patient Vitals for the past 24 hrs:  BP Temp  Temp src Pulse Resp SpO2 Height Weight  09/23/16 1408 133/77 98.7 F (37.1 C) Oral (!) 118 18 93 % - -  09/23/16 1400 - - - - - 95 % - -  09/23/16 0923 - - - - - 95 % - -  09/23/16 0427 137/78 98.2 F (36.8 C) Oral (!) 108 20 97 % - 184 lb 11.2 oz (83.8 kg)  09/22/16 2137 125/83 97.8 F (36.6 C) Oral (!) 107 (!) 23 100 % 5' 4"  (1.626 m) 187 lb 6.4 oz (85 kg)  09/22/16 2100 120/72 - - - 19 - - -  09/22/16 2030 122/75 - - - 25 - - -  09/22/16 2021 137/76 - - 103 17 96 % - -  09/22/16 2000 137/76 - - - 13 - - -  09/22/16 1930 147/96 - - 102 23 97 % - -   As above    Recent Labs  09/22/16 1636 09/23/16 0201  WBC 1.6* 1.5*  HGB 8.0* 8.1*  HCT 23.8* 23.8*  PLT 134* 127*    Recent Labs  09/22/16 1636 09/23/16 0201  NA 134* 134*  K 2.9* 3.0*  CL 93* 94*  CO2 31 27  GLUCOSE 89 111*  BUN 20 19  CREATININE 1.16* 1.34*  CALCIUM 8.9 8.8*    Blood smear review:  None  Pathology: None     Assessment and Plan:  Ms. Dupas is a 35 old white female. She has metastatic point differentiated carcinoma the lung. She actually has done incredibly well with systemic treatment.  The pulmonary emboli clearly are related to her underlying malignancy.  We have several issues that need to be addressed. She has marked anemia. She has had problems with GI bleeding in the past.  I think we have to get her back on  heparin. With her anemia, and history of GI bleeding, I just don't feel confident with her being on Xarelto since this would be very difficult to reverse immediately. In addition, there is a slightly increased risk of GI bleeding with Xarelto.  She has this large pericardial effusion. I have to believe that this needs to be drained. I realize that there is no pericardial tamponade physiology. However this is a fairly large effusion. This could easily be causing her tachycardia. Also could be implicated with her shortness of breath. I think that cardiology needs to be involved and we need to consider a pericardiocentesis.  She is quite anemic. I will check her CBC tomorrow. I suspect we probably will have to transfuse her.  Overall, I believe that she is going to need heparin or possibly Lovenox as a long-term anticoagulant. A lot will really be dictated by her anemia and whether or not she has any GI bleeding.  I really think the pericardial effusion needs to be drained. I think this will make her feel better and will help with her dyspnea.  We will follow her along. I suspect that she may need some type of invasive procedure(s) and having her on heparin would make procedures more readily available.  I have known Ms. Brougham for over a year. I am glad that she came to the hospital. I think we have several issues that can now be addressed more effectively.  As always, I know the staff on [address]. we'll do a tremendous job in taking care of her.  Lattie Haw, MD  Carol Buchanan 9:24

## 2016-09-23 NOTE — Progress Notes (Addendum)
[facility] PROGRESS NOTE    Progress Note  Carol Buchanan  ALP:[id number] DOB: [birth year] DOA: 09/22/2016 PCP: Andria Frames, MD     Brief Narrative:   Carol Buchanan is an 56 y.o. 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  Was treated with XRT initially and has been on chemoRx since then, f/b Dr Marin Olp.  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  Presenting with one month hx worsening SOB.  CT angio of chest in ED today showed bilat segmental and subsegmental pulm emboli, no R heart strain,  Assessment/Plan:   Acute Pulmonary embolism: Was started on IV heparin and supplemental oxygen. She would want to be on a NOAC, preferable once a day.  Acute diastolic heart failure: Cont  on IV Lasix. Strict I and O's. Daily weights.  Non-small cell carcinoma of the lung/Primary lung cancer with metastasis from lung to other site [facility]): Started chemotherapy about 3 weeks prior to admission. With a prior history of pneumonitis (prob from [place]). Now only  On Alimta (pemetrexed)  COPD (chronic obstructive pulmonary disease) (H[facility])  Essential hypertension  Pneumonitis, from immunomodulator  Chronic respiratory failure ([place]), on home O2 Saturations remained stable.  Pericardial effusion: Unchanged from prior not hypotension.  DVT prophylaxis: Heaprin Family Communication:nonme Disposition Plan/Barrier to D/C: home in am Code Status:     Code Status Orders        Start     Ordered   09/22/16 2140  Full code  Continuous     09/22/16 2139    Code Status History    Date Active Date Inactive Code Status Order ID Comments User Context   12/27/2015 10:42 AM 12/28/2015  5:30 PM Full Code [id number]  Volanda Napoleon, MD Inpatient   11/26/2015  2:03 AM 12/02/2015  3:42 PM DNR [id number]  Toy Baker, MD Inpatient    Advance Directive Documentation   Flowsheet Row Most Recent Value  Type of Advance Directive  Living will  Pre-existing out  of facility DNR order (yellow form or pink MOST form)  No data  "MOST" Form in Place?  No data        IV Access:    Peripheral IV   Procedures and diagnostic studies:   Dg Chest 2 View  Result Date: 09/22/2016 CLINICAL DATA:  Shortness of breath.  History of lung carcinoma EXAM: CHEST  2 VIEW COMPARISON:  Chest radiograph Dec 24, 2015 and PET-CT July 06, 2016 FINDINGS: There is loculated pleural effusion on the left with atelectasis in left base. There is consolidation in the left upper lobe posterior segment as well as in portions of the left lower lobe medially. On the right, there is atelectatic change in the medial right base region. Lungs elsewhere clear. There is mild cardiomegaly with pulmonary vascularity within normal limits. No adenopathy. No bone lesions appreciable. Port-A-Cath tip at cavoatrial junction. No pneumothorax. IMPRESSION: Areas of consolidation on the left, primarily in the left upper lobe, posterior segment. Loculated pleural effusion on the left with atelectatic change. Atelectasis right perihilar region. Stable cardiomegaly. No adenopathy by radiography apparent. No pneumothorax. Electronically Signed   By: Lowella Grip III M.D.   On: 09/22/2016 15:47   Ct Angio Chest Pe W And/or Wo Contrast  Result Date: 09/22/2016 CLINICAL DATA:  56 year old female with lung cancer. Patient is on chemotherapy with last treatment 8 days ago. Diagnosed with anemia last week beginning transfusion. Worsening of dyspnea. History of pericardial effusion  without physiologic effect. EXAM: CT ANGIOGRAPHY CHEST WITH CONTRAST TECHNIQUE: Multidetector CT imaging of the chest was performed using the standard protocol during bolus administration of intravenous contrast. Multiplanar CT image reconstructions and MIPs were obtained to evaluate the vascular anatomy. CONTRAST:  65 cc Isovue 370 COMPARISON:  Chest CT 11/25/2015, CXR 09/22/2016 FINDINGS: Cardiovascular: Acute bilateral lower  lobe segmental and subsegmental nonocclusive pulmonary emboli without right heart strain. RV/ LV ratio equals 0.67. Cardiomegaly with circumferential pericardial effusion measuring up to 3.1 cm in thickness no aortic aneurysm or dissection. Coronary arteriosclerosis. Port catheter tip projects within the proximal right atrium. Aberrant right subclavian artery. Mediastinum/Nodes: No mediastinal lymphadenopathy. No axillary or supraclavicular adenopathy. Calcified right 1.7 cm thyroid nodule. Lungs/Pleura: Moderate loculated left pleural effusion with adjacent areas of pulmonary atelectasis and consolidation. Small right pleural effusion. Left hilar masslike opacity noted on prior exam is obscured by adjacent atelectasis and pleural fluid centrilobular emphysema is identified upper lobe predominant. Hazy ground-glass opacities of the lungs may reflect sequela of CHF. Upper Abdomen: No acute abnormality. Musculoskeletal: Sclerotic appearance of the T4 vertebral body concerning for osseous metastatic disease. Degenerative disc space narrowing from T3 through T8 with small posterior marginal osteophytes at T5-6 and T6-7. Slightly sclerotic appearance of the C6 and C7 vertebral bodies are also noted. Review of the MIP images confirms the above findings. IMPRESSION: 1. Bilateral nonocclusive segmental and subsegmental pulmonary emboli without right heart strain. RV/LV ratio equals 0.67. 2. Loculated left pleural effusion with adjacent compressive atelectasis. Mild CHF. 3. Cardiomegaly with large circumferential pericardial effusion measuring up to 3.1 cm in thickness. 4. Sclerotic appearance of several visualized lower cervical and mid thoracic vertebrae concerning for osteoblastic disease in particular T4. The other areas of sclerosis may be due to degenerative disc disease and discogenic sclerosis. Mixed sclerotic and lytic appearance of the right sternum at the sternoclavicular joint is more likely related to  osteoarthritic degenerative change. 5. Critical Value/emergent results were called by telephone at the time of interpretation on 09/22/2016 at 6:01 pm to Dr. Zenovia Jarred , who verbally acknowledged these results. Electronically Signed   By: Ashley Royalty M.D.   On: 09/22/2016 18:01     Medical Consultants:    None.  Anti-Infectives:   None  Subjective:    Carol Buchanan she relates her breathing is close to baseline.  Objective:    Vitals:   09/22/16 2030 09/22/16 2100 09/22/16 2137 09/23/16 0427  BP: 122/75 120/72 125/83 137/78  Pulse:   (!) 107 (!) 108  Resp: 25 19 (!) 23 20  Temp:   97.8 F (36.6 C) 98.2 F (36.8 C)  TempSrc:   Oral Oral  SpO2:   100% 97%  Weight:   85 kg (187 lb 6.4 oz) 83.8 kg (184 lb 11.2 oz)  Height:   '5\' 4"'$  (1.626 m)     Intake/Output Summary (Last 24 hours) at 09/23/16 0838 Last data filed at 09/23/16 0542  Gross per 24 hour  Intake           487.43 ml  Output             1575 ml  Net         -1087.57 ml   Filed Weights   09/22/16 1527 09/22/16 2137 09/23/16 0427  Weight: 81.6 kg (180 lb) 85 kg (187 lb 6.4 oz) 83.8 kg (184 lb 11.2 oz)    Exam: General exam: In no acute distress. Respiratory system: Moderate air movement with diffuse  crackles bilaterally no wheezing. Cardiovascular system: S1 & S2 heard, RRR.  Gastrointestinal system: Abdomen is nondistended, soft and nontender.  Extremities: No pedal edema. Skin: No rashes, lesions or ulcers Psychiatry: Judgement and insight appear normal. Mood & affect appropriate.    Data Reviewed:    Labs: Basic Metabolic Panel:  Recent Labs Lab 09/22/16 1636 09/23/16 0201  NA 134* 134*  K 2.9* 3.0*  CL 93* 94*  CO2 31 27  GLUCOSE 89 111*  BUN 20 19  CREATININE 1.16* 1.34*  CALCIUM 8.9 8.8*   GFR Estimated Creatinine Clearance: 49.1 mL/min (by C-G formula based on SCr of 1.34 mg/dL (H)). Liver Function Tests:  Recent Labs Lab 09/22/16 1636  AST 40  ALT 41  ALKPHOS  157*  BILITOT 0.5  PROT 7.2  ALBUMIN 3.2*   No results for input(s): LIPASE, AMYLASE in the last 168 hours. No results for input(s): AMMONIA in the last 168 hours. Coagulation profile  Recent Labs Lab 09/22/16 1636  INR 1.15    CBC:  Recent Labs Lab 09/22/16 1636 09/23/16 0201  WBC 1.6* 1.5*  NEUTROABS 0.8* 0.7*  HGB 8.0* 8.1*  HCT 23.8* 23.8*  MCV 97.9 100.0  PLT 134* 127*   Cardiac Enzymes: No results for input(s): CKTOTAL, CKMB, CKMBINDEX, TROPONINI in the last 168 hours. BNP (last 3 results) No results for input(s): PROBNP in the last 8760 hours. CBG: No results for input(s): GLUCAP in the last 168 hours. D-Dimer: No results for input(s): DDIMER in the last 72 hours. Hgb A1c: No results for input(s): HGBA1C in the last 72 hours. Lipid Profile: No results for input(s): CHOL, HDL, LDLCALC, TRIG, CHOLHDL, LDLDIRECT in the last 72 hours. Thyroid function studies: No results for input(s): TSH, T4TOTAL, T3FREE, THYROIDAB in the last 72 hours.  Invalid input(s): FREET3 Anemia work up: No results for input(s): VITAMINB12, FOLATE, FERRITIN, TIBC, IRON, RETICCTPCT in the last 72 hours. Sepsis Labs:  Recent Labs Lab 09/22/16 1636 09/22/16 1647 09/23/16 0201  WBC 1.6*  --  1.5*  LATICACIDVEN  --  0.91  --    Microbiology No results found for this or any previous visit (from the past 240 hour(s)).   Medications:   . DULoxetine  60 mg Oral Daily  . feeding supplement (ENSURE ENLIVE)  237 mL Oral BID BM  . fluticasone  2 spray Each Nare Daily  . folic acid  1 mg Oral Daily  . furosemide  20 mg Intravenous Q12H  . glipiZIDE  5 mg Oral QAC breakfast  . ipratropium-albuterol  3 mL Nebulization Daily  . montelukast  10 mg Oral QHS  . pantoprazole  40 mg Oral Daily  . potassium chloride SA  20 mEq Oral Daily  . potassium chloride  40 mEq Oral BID  . simvastatin  20 mg Oral q1800  . sodium chloride flush  3 mL Intravenous Q12H  . sodium chloride flush  3 mL  Intravenous Q12H  . vitamin C  1,000 mg Oral Daily   Continuous Infusions: . heparin 1,450 Units/hr (09/23/16 0327)    Time spent: 25 min   LOS: 1 day   Carol Buchanan  [facility] Pager [phone]  *Please refer to [email or web address], password [email or web address] to get updated schedule on who will round on this patient, as hospitalists switch teams weekly. If 7PM-7AM, please contact night-coverage at [email or web address], password [email or web address] for any overnight needs.  09/23/2016, 8:38 AM

## 2016-09-23 NOTE — Progress Notes (Signed)
*  Preliminary Results* Bilateral lower extremity venous duplex completed. Bilateral lower extremities are negative for deep vein thrombosis. There is no evidence of Baker's cyst bilaterally.  09/23/2016 10:31 AM Maudry Mayhew, BS, RVT, RDCS, RDMS

## 2016-09-23 NOTE — Progress Notes (Signed)
ANTICOAGULATION CONSULT NOTE - Initial Consult  Pharmacy Consult for Heparin Indication: pulmonary embolus  No Known Allergies  Patient Measurements: Height: '5\' 4"'$  (162.6 cm) Weight: 184 lb 11.2 oz (83.8 kg) IBW/kg (Calculated) : 54.7 Heparin Dosing Weight: 72.3 kg  Vital Signs: Temp: 98.2 F (36.8 C) (02/28 0427) Temp Source: Oral (02/28 0427) BP: 137/78 (02/28 0427) Pulse Rate: 108 (02/28 0427)  Labs:  Recent Labs  09/22/16 1636 09/23/16 0201 09/23/16 1016  HGB 8.0* 8.1*  --   HCT 23.8* 23.8*  --   PLT 134* 127*  --   APTT 48*  --   --   LABPROT 14.8  --   --   INR 1.15  --   --   HEPARINUNFRC  --  <0.10* >1.10*  CREATININE 1.16* 1.34*  --     Estimated Creatinine Clearance: 49.1 mL/min (by C-G formula based on SCr of 1.34 mg/dL (H)).   Medical History: Past Medical History:  Diagnosis Date  . COPD (chronic obstructive pulmonary disease) ([place])   . DM (diabetes mellitus), type 2, uncontrolled ([place]) 11/26/2015  . Hyperlipidemia   . Hypertension   . Neuropathy ([place])   . Primary lung cancer with metastasis from lung to other site [facility]) 07/26/2015  . 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 Sinus trouble      Assessment: 48 y/oF with PMH of lung cancer (last dose of Alimta on 09/14/16), anemia (s/p blood transfusion last week), COPD, DM, HLD, HTN who presents to [facility] ED with worsening shortness of breath. CTa of chest + for PE. Pharmacy consulted to start heparin infusion. Patient not on any anticoagulants PTA. CBC reveals low Hgb at 8, low Pltc at 134K. PT/INR WNL, aPTT elevated at 48 seconds. No active bleeding reported.  Today, 09/23/2016:  CBC: Hgb/Plt low but stable from yesterday, consistent with baseline anemia/thrombocytopenia  Most recent heparin level remains SUBtherapeutic on 1450 units/hr  No bleeding or infusion issues per nursing  CrCl: 49 ml/min  Goal of Therapy:  Heparin level  0.3-0.7 units/ml Monitor platelets by anticoagulation protocol: Yes   Plan:   Repeat Heparin 2000 units IV bolus x 1  Increase heparin infusion to 2000 units/hr  Heparin level 6 hours after rate change  Daily CBC and heparin level while on heparin infusion.  Monitor for s/sx of bleeding   Reuel Boom, PharmD, BCPS Pager: [phone] 09/23/2016, 12:20 PM

## 2016-09-23 NOTE — Discharge Instructions (Addendum)
Information on my medicine - Pradaxa (dabigatran)  This medication education was reviewed with me or my healthcare representative as part of my discharge preparation.  The pharmacist that spoke with me during my hospital stay was:  Georgina Peer, [facility]  Why was Pradaxa prescribed for you? Pradaxa was prescribed to treat blood clots that may have been found in the veins of your legs (deep vein thrombosis) or in your lungs (pulmonary embolism) and to reduce the risk of them occurring again.  Pradaxa will take the place of the injectable anticoagulant medication you have been receiving for this condition for the last 5-10 days.  What do you Need to know about PradAXa? Take your Pradaxa TWICE DAILY - one capsule in the morning and one tablet in the evening with or without food.  It would be best to take the doses about the same time each day.  The capsules should not be broken, chewed or opened - they must be swallowed whole.  Do not store Pradaxa in other medication containers - once the bottle is opened the Pradaxa should be used within FOUR months; throw away any capsules that havent been by that time.  Take Pradaxa exactly as prescribed by your doctor.  DO NOT stop taking Pradaxa without talking to the doctor who prescribed the medication.  Refill your prescription before you run out.  After discharge, you should have regular check-up appointments with your healthcare provider that is prescribing your Pradaxa.  In the future your dose may need to be changed if your kidney function or weight changes by a significant amount.  What do you do if you miss a dose? If you miss a dose, take it as soon as you remember on the same day.  If your next dose is less than 6 hours away, skip the missed dose.  Do not take two doses of PRADAXA at the same time.  Important Safety Information A possible side effect of Pradaxa is bleeding. You should call your healthcare provider right away if you  experience any of the following: ? Bleeding from an injury or your nose that does not stop. ? Unusual colored urine (red or dark brown) or unusual colored stools (red or black). ? Unusual bruising for unknown reasons. ? A serious fall or if you hit your head (even if there is no bleeding).  Some medicines may interact with Pradaxa and might increase your risk of bleeding or clotting while on Pradaxa. To help avoid this, consult your healthcare provider or pharmacist prior to using any new prescription or non-prescription medications, including herbals, vitamins, non-steroidal anti-inflammatory drugs (NSAIDs) and supplements.  This website has more information on Pradaxa (dabigatran): www.[employer].cz.   Wound Care:  Please shower daily, wash wounds with soap and water, pat dry

## 2016-09-23 NOTE — Progress Notes (Signed)
ANTICOAGULATION CONSULT NOTE - Follow Up Consult  Pharmacy Consult for Heparin Indication: pulmonary embolus  No Known Allergies  Patient Measurements: Height: '5\' 4"'$  (162.6 cm) Weight: 187 lb 6.4 oz (85 kg) IBW/kg (Calculated) : 54.7 Heparin Dosing Weight:   Vital Signs: Temp: 97.8 F (36.6 C) (02/27 2137) Temp Source: Oral (02/27 2137) BP: 125/83 (02/27 2137) Pulse Rate: 107 (02/27 2137)  Labs:  Recent Labs  09/22/16 1636 09/23/16 0201  HGB 8.0* 8.1*  HCT 23.8* 23.8*  PLT 134* 127*  APTT 48*  --   LABPROT 14.8  --   INR 1.15  --   HEPARINUNFRC  --  <0.10*  CREATININE 1.16* 1.34*    Estimated Creatinine Clearance: 49.4 mL/min (by C-G formula based on SCr of 1.34 mg/dL (H)).   Medications:  Infusions:  . heparin 1,200 Units/hr (09/22/16 1933)    Assessment: Patient with low heparin level.  No heparin issues per RN.  Goal of Therapy:  Heparin level 0.3-0.7 units/ml Monitor platelets by anticoagulation protocol: Yes   Plan:  Increase heparin to 1450 units/hr Recheck level at [place], Basye Crowford 09/23/2016,3:27 AM

## 2016-09-23 NOTE — Progress Notes (Signed)
[place] for Heparin Indication: pulmonary embolus  No Known Allergies  Patient Measurements: Height: '5\' 4"'$  (162.6 cm) Weight: 184 lb 11.2 oz (83.8 kg) IBW/kg (Calculated) : 54.7 Heparin Dosing Weight: 73 kg  Vital Signs: Temp: 98.7 F (37.1 C) (02/28 1408) Temp Source: Oral (02/28 1408) BP: 133/77 (02/28 1408) Pulse Rate: 118 (02/28 1408)  Labs:  Recent Labs  09/22/16 1636 09/23/16 0201 09/23/16 1016  HGB 8.0* 8.1*  --   HCT 23.8* 23.8*  --   PLT 134* 127*  --   APTT 48*  --   --   LABPROT 14.8  --   --   INR 1.15  --   --   HEPARINUNFRC  --  <0.10* >1.10*  CREATININE 1.16* 1.34*  --     Estimated Creatinine Clearance: 49.1 mL/min (by C-G formula based on SCr of 1.34 mg/dL (H)).   Medical History: Past Medical History:  Diagnosis Date  . COPD (chronic obstructive pulmonary disease) ([place])   . DM (diabetes mellitus), type 2, uncontrolled ([place]) 11/26/2015  . Hyperlipidemia   . Hypertension   . Neuropathy ([place])   . Primary lung cancer with metastasis from lung to other site [facility]) 07/26/2015  . 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 Sinus trouble      Assessment: 41 y/oF with PMH of lung cancer (last dose of Alimta on 09/14/16), anemia (s/p blood transfusion last week), COPD, DM, HLD, HTN who presented to [facility] ED on 09/22/16 with worsening shortness of breath. CTa of chest + for PE. Pharmacy consulted to start heparin infusion on admission. Patient not on any anticoagulants PTA. Admission CBC revealed low Hgb at 8, low Pltc at 134K. PT/INR WNL, aPTT elevated at 48 seconds. No active bleeding reported.  Today, 09/23/16:  Patient transitioned to Xarelto-received last dose 2/28 at 1702.   CBC: Hgb, Pltc low but stable from yesterday  No bleeding issues reported  CrCl ~ 49 ml/min  Given anemia, history of GI bleed in the past, possible need for invasive procedures,  MD switching patient back to heparin infusion.  Goal of Therapy:  Heparin level 0.3-0.7 units/ml Monitor platelets by anticoagulation protocol: Yes   Plan:   Will need to delay restarting heparin infusion until 2/28 AM, as patient just received Xarelto this evening.  Will check heparin level and aPTT with morning labs. Note that Xarelto can falsely elevate the heparin level. If heparin level elevated, will use aPTT to adjust heparin infusion until both heparin level and aPTT correlate and thus effects of Xarelto have diminished.   Daily CBC while on heparin infusion.  Monitor for s/sx of bleeding.    Lindell Spar, PharmD, BCPS Pager: [phone] 09/22/2016 7:15 PM

## 2016-09-23 NOTE — Progress Notes (Signed)
This RN acknowledged orders to restart heparin drip. Per pharmacy note, since pt. Took dose of Xarelto this evening, the restart of heparin drip will be delayed to 3/1 AM after morning labs have been drawn to check heparin level and aPTT level. Pt. Was educated and has agreed to the plan. Will continue to monitor pt. Closely and monitor for s/s of bleeding.

## 2016-09-24 ENCOUNTER — Inpatient Hospital Stay (HOSPITAL_COMMUNITY): Payer: Medicare Other

## 2016-09-24 DIAGNOSIS — I313 Pericardial effusion (noninflammatory): Secondary | ICD-10-CM

## 2016-09-24 DIAGNOSIS — I319 Disease of pericardium, unspecified: Secondary | ICD-10-CM

## 2016-09-24 DIAGNOSIS — I314 Cardiac tamponade: Secondary | ICD-10-CM

## 2016-09-24 DIAGNOSIS — R0600 Dyspnea, unspecified: Secondary | ICD-10-CM

## 2016-09-24 LAB — URINALYSIS, ROUTINE W REFLEX MICROSCOPIC
Bilirubin Urine: NEGATIVE
Glucose, UA: NEGATIVE mg/dL
Hgb urine dipstick: NEGATIVE
Ketones, ur: NEGATIVE mg/dL
Leukocytes, UA: NEGATIVE
Nitrite: NEGATIVE
Protein, ur: 100 mg/dL — AB
Specific Gravity, Urine: 1.014 (ref 1.005–1.030)
pH: 6 (ref 5.0–8.0)

## 2016-09-24 LAB — CBC
HEMATOCRIT: 24.4 % — AB (ref 36.0–46.0)
Hemoglobin: 8.1 g/dL — ABNORMAL LOW (ref 12.0–15.0)
MCH: 33.1 pg (ref 26.0–34.0)
MCHC: 33.2 g/dL (ref 30.0–36.0)
MCV: 99.6 fL (ref 78.0–100.0)
Platelets: 141 10*3/uL — ABNORMAL LOW (ref 150–400)
RBC: 2.45 MIL/uL — ABNORMAL LOW (ref 3.87–5.11)
RDW: 16.9 % — ABNORMAL HIGH (ref 11.5–15.5)
WBC: 2.5 10*3/uL — ABNORMAL LOW (ref 4.0–10.5)

## 2016-09-24 LAB — BASIC METABOLIC PANEL
ANION GAP: 12 (ref 5–15)
BUN: 21 mg/dL — ABNORMAL HIGH (ref 6–20)
CHLORIDE: 92 mmol/L — AB (ref 101–111)
CO2: 34 mmol/L — AB (ref 22–32)
Calcium: 9 mg/dL (ref 8.9–10.3)
Creatinine, Ser: 1.42 mg/dL — ABNORMAL HIGH (ref 0.44–1.00)
GFR calc Af Amer: 47 mL/min — ABNORMAL LOW (ref >60)
GFR calc non Af Amer: 40 mL/min — ABNORMAL LOW (ref >60)
GLUCOSE: 107 mg/dL — AB (ref 65–99)
POTASSIUM: 3.2 mmol/L — AB (ref 3.5–5.1)
Sodium: 138 mmol/L (ref 135–145)

## 2016-09-24 LAB — APTT
aPTT: 77 seconds — ABNORMAL HIGH (ref 24–36)
aPTT: 61 seconds — ABNORMAL HIGH (ref 24–36)

## 2016-09-24 LAB — GLUCOSE, CAPILLARY
GLUCOSE-CAPILLARY: 103 mg/dL — AB (ref 65–99)
Glucose-Capillary: 85 mg/dL (ref 65–99)

## 2016-09-24 LAB — ABO/RH: ABO/RH(D): O POS

## 2016-09-24 LAB — ECHOCARDIOGRAM COMPLETE
Height: 64 in
Weight: 2883.2 oz

## 2016-09-24 LAB — MRSA PCR SCREENING: MRSA by PCR: NEGATIVE

## 2016-09-24 LAB — SURGICAL PCR SCREEN
MRSA, PCR: NEGATIVE
Staphylococcus aureus: NEGATIVE

## 2016-09-24 LAB — HEPARIN LEVEL (UNFRACTIONATED): Heparin Unfractionated: >2.2 IU/mL — ABNORMAL HIGH (ref 0.30–0.70)

## 2016-09-24 LAB — HIV ANTIBODY (ROUTINE TESTING W REFLEX): HIV Screen 4th Generation wRfx: NONREACTIVE

## 2016-09-24 MED ORDER — POTASSIUM CHLORIDE CRYS ER 20 MEQ PO TBCR
40.0000 meq | EXTENDED_RELEASE_TABLET | Freq: Once | ORAL | Status: AC
  Administered 2016-09-24: 40 meq via ORAL
  Filled 2016-09-24: qty 2

## 2016-09-24 MED ORDER — HEPARIN (PORCINE) IN NACL 100-0.45 UNIT/ML-% IJ SOLN
1450.0000 [IU]/h | INTRAMUSCULAR | Status: AC
  Administered 2016-09-24: 1200 [IU]/h via INTRAVENOUS
  Filled 2016-09-24 (×2): qty 250

## 2016-09-24 MED ORDER — POTASSIUM CHLORIDE CRYS ER 20 MEQ PO TBCR: 40.0000 meq | EXTENDED_RELEASE_TABLET | Freq: Three times a day (TID) | ORAL | Status: DC

## 2016-09-24 MED ORDER — SODIUM CHLORIDE 0.9 % IV BOLUS (SEPSIS)
500.0000 mL | Freq: Once | INTRAVENOUS | Status: AC
  Administered 2016-09-24: 500 mL via INTRAVENOUS

## 2016-09-24 NOTE — Progress Notes (Signed)
  Subjective: Patient examined and echocardiogram, CTA chest personally reviewed 56 yo female with moderate -large pericardial effusion - hx chemo radiation to L chest for lung cancer 2016 She is stable currently- no tamponade Recent CT shows bilat lower lobe PE She is on heparin now but received 30 mg xarelto yesterday - will wait  until march 3 for pericardial window- stoping heparin perioperatively Objective: Vital signs in last 24 hours: Temp:  [97.4 F (36.3 C)-98.6 F (37 C)] 97.8 F (36.6 C) (03/01 1442) Pulse Rate:  [102-118] 109 (03/01 1500) Cardiac Rhythm: Sinus tachycardia (03/01 1500) Resp:  [18-23] 23 (03/01 1500) BP: (132-158)/(71-86) 140/84 (03/01 1500) SpO2:  [94 %-98 %] 95 % (03/01 1500) Weight:  [180 lb (81.6 kg)-180 lb 3.2 oz (81.7 kg)] 180 lb (81.6 kg) (03/01 1442)  Hemodynamic parameters for last 24 hours:  sinus  Intake/Output from previous day: 02/28 0701 - 03/01 0700 In: 112 [I.V.:112] Out: 1000 [Urine:1000] Intake/Output this shift: Total I/O In: 240 [P.O.:240] Out: -        Exam    General- alert and comfortable   Lungs- decrease BS L base  without rales, wheezes   Cor- regular rate and rhythm, no murmur , gallop   Abdomen- soft, non-tender   Extremities - warm, non-tender, minimal edema   Neuro- oriented, appropriate, no focal weakness   Lab Results:  Recent Labs  09/23/16 0201 09/24/16 0502  WBC 1.5* 2.5*  HGB 8.1* 8.1*  HCT 23.8* 24.4*  PLT 127* 141*   BMET:  Recent Labs  09/23/16 0201 09/24/16 0502  NA 134* 138  K 3.0* 3.2*  CL 94* 92*  CO2 27 34*  GLUCOSE 111* 107*  BUN 19 21*  CREATININE 1.34* 1.42*  CALCIUM 8.8* 9.0    PT/INR:  Recent Labs  09/22/16 1636  LABPROT 14.8  INR 1.15   ABG    Component Value Date/Time   PHART 7.400 11/25/2015 2143   HCO3 16.5 (L) 11/25/2015 2143   TCO2 15.0 11/25/2015 2143   ACIDBASEDEF 6.8 (H) 11/25/2015 2143   O2SAT 90.8 11/25/2015 2143   CBG (last 3)   Recent Labs  09/24/16 0611  GLUCAP 103*    Assessment/Plan: S/P  Pericardial window march 3 after xarelto washout Discussed with patient  LOS: 2 days    Tharon Aquas Trigt III 09/24/2016

## 2016-09-24 NOTE — Progress Notes (Signed)
Patient transferred from [facility] to SDU. C/o dyspnea on minimal exertion, no chest pain.  Vitals reviewed  o2 sat 96% on 4L [place], HR in low 100s.  Bilateral subsegmental PE/ pericardial tamponade D/w with CTVS. Recommended resuming IV heparin. Plan on pericardial window on 3/3. Cardiology following. Avoid aggressive diuresis to prevent hypotension with tamponade. continue stepdown monitoring.

## 2016-09-24 NOTE — Consult Note (Signed)
Cardiology Consult    Patient ID: Carol Buchanan MRN: [id number], DOB/AGE: 1961-05-28   Admit date: 09/22/2016 Date of Consult: 09/24/2016  Primary Physician: Andria Frames, MD Primary Cardiologist: N[place] Requesting Provider: Aileen Fass Reason for Consultation: Pericardial Effusion  Patient Profile    56 yo female with PMH of Non-small cell Lung Ca, COPD, HTN, HL, NIDDM who presented with worsening dyspnea, and found to have bilateral PEs and loculated pleural effusion.   Past Medical History   Past Medical History:  Diagnosis Date  . COPD (chronic obstructive pulmonary disease) ([place])   . DM (diabetes mellitus), type 2, uncontrolled ([place]) 11/26/2015  . Hyperlipidemia   . Hypertension   . Neuropathy ([place])   . Primary lung cancer with metastasis from lung to other site [facility]) 07/26/2015  . 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 Sinus trouble     Past Surgical History:  Procedure Laterality Date  . BACK SURGERY  2015  . COLONOSCOPY Left 12/28/2015   Procedure: COLONOSCOPY;  Surgeon: Wilford Corner, MD;  Location: [facility] ENDOSCOPY;  Service: Endoscopy;  Laterality: Left;  . ESOPHAGOGASTRODUODENOSCOPY N/A 12/28/2015   Procedure: ESOPHAGOGASTRODUODENOSCOPY (EGD);  Surgeon: Wilford Corner, MD;  Location: [facility];  Service: Endoscopy;  Laterality: N/A;  . TUBAL LIGATION  1994  . VIDEO BRONCHOSCOPY Bilateral 07/01/2015   Procedure: VIDEO BRONCHOSCOPY WITHOUT FLUORO;  Surgeon: Collene Gobble, MD;  Location: [place];  Service: Cardiopulmonary;  Laterality: Bilateral;     Allergies  No Known Allergies  History of Present Illness    Carol Buchanan is a 56 yo female with PMH of Non-small cell Lung Ca, COPD, HTN, HL, NIDDM. She was dx with non small cell Lung Ca in 2016 and currently followed by Dr. Marin Olp. Was treated with XRT and has been on chemo since that time. She was referred to Dr. Acie Fredrickson after echo in  12/17 showed an increase in a pericardial effusion from trivial to moderate.  At the office visit she reported chronic dyspnea using home O2 at 2L. Her echo was repeated and showed no change in size of pericardial effusion. Plan was to have her follow up in 3-6 months to determine progression.   She reports since that visit over the past month and steady progression of her dyspnea. Has had to increase her O2 use from 2L to 4L at home. Denies any chest pain, but reports diffuse abd pain. She called the Oncology office but was unable to get an appt, and instructed to come to the [facility].   Found to have bilateral seg and subseg PE with no heart strain, along with loculated left pleural effusion and large circumferential pericardial effusion. She was started on IV heparin. Other labs showed K+ 2.9, BNP 72, Hgb 8.0, trop neg x1, and lactic acid 0.91. She was admitted to [facility] for further management with oncology consulted.   Inpatient Medications    . DULoxetine  60 mg Oral Daily  . fluticasone  2 spray Each Nare Daily  . folic acid  1 mg Oral Daily  . furosemide  20 mg Intravenous Q12H  . ipratropium-albuterol  3 mL Nebulization TID  . montelukast  10 mg Oral QHS  . pantoprazole  40 mg Oral Daily  . potassium chloride SA  20 mEq Oral Daily  . potassium chloride  40 mEq Oral Once  . simvastatin  20 mg Oral q1800  . sodium chloride flush  3 mL  Intravenous Q12H  . sodium chloride flush  3 mL Intravenous Q12H  . vitamin C  1,000 mg Oral Daily    Family History    Family History  Problem Relation Age of Onset  . Heart disease Father   . Heart attack Father   . Arthritis Mother     Social History    Social History   Social History  . Marital status: Single    Spouse name: N/A  . Number of children: 0  . Years of education: 79   Occupational History  . unemployed [employer]    Social History Main Topics  . Smoking status: Former Smoker    Packs/day: 1.50    Years: 35.00    Types:  Cigarettes    Quit date: 01/26/2011  . Smokeless tobacco: Never Used  . Alcohol use 1.2 oz/week    2 Standard drinks or equivalent per week     Comment: on weekends  . Drug use: No  . Sexual activity: No   Other Topics Concern  . Not on file   Social History Narrative   Patient lives at home with her mother.   Patient is not working   Right handed   Education- One year of college     Review of Systems    General:  No chills, fever, night sweats or weight changes.  Cardiovascular:  No chest pain, dyspnea on exertion, edema, orthopnea, palpitations, paroxysmal nocturnal dyspnea. Dermatological: No rash, lesions/masses Respiratory: No cough, ++ dyspnea Urologic: No hematuria, dysuria Abdominal:   No nausea, vomiting, diarrhea, bright red blood per rectum, melena, or hematemesis Neurologic:  No visual changes, ++ wkns, changes in mental status. All other systems reviewed and are otherwise negative except as noted above.  Physical Exam    Blood pressure (!) 141/84, pulse (!) 102, temperature 97.4 F (36.3 C), temperature source Oral, resp. rate 18, height '5\' 4"'$  (1.626 m), weight 180 lb 3.2 oz (81.7 kg), SpO2 94 %.  General: Pleasant ill appearing WF, [place] '@4L'$  Psych: Normal affect. Neuro: Alert and oriented X 3. Moves all extremities spontaneously. HEENT: Normal  Neck: Supple without bruits, + JVD. Lungs:  Resp regular and slightly labored, Diminished bilterally. Heart: Tachy, RR no s3, s4, or murmurs, no pericardial rub. Abdomen: Soft, non-tender, non-distended, BS + x 4.  Extremities: No clubbing, cyanosis or edema. DP/PT/Radials 2+ and equal bilaterally.  Labs    [facility] Test)  Recent Labs  09/22/16 1646  TROPIPOC 0.00   No results for input(s): CKTOTAL, CKMB, TROPONINI in the last 72 hours. Lab Results  Component Value Date   WBC 2.5 (L) 09/24/2016   HGB 8.1 (L) 09/24/2016   HCT 24.4 (L) 09/24/2016   MCV 99.6 09/24/2016   PLT 141 (L)  09/24/2016    Recent Labs Lab 09/22/16 1636  09/24/16 0502  NA 134*  < > 138  K 2.9*  < > 3.2*  CL 93*  < > 92*  CO2 31  < > 34*  BUN 20  < > 21*  CREATININE 1.16*  < > 1.42*  CALCIUM 8.9  < > 9.0  PROT 7.2  --   --   BILITOT 0.5  --   --   ALKPHOS 157*  --   --   ALT 41  --   --   AST 40  --   --   GLUCOSE 89  < > 107*  < > = values in this  interval not displayed. No results found for: CHOL, HDL, LDLCALC, TRIG No results found for: [facility]   Radiology Studies    Dg Chest 2 View  Result Date: 09/22/2016 CLINICAL DATA:  Shortness of breath.  History of lung carcinoma EXAM: CHEST  2 VIEW COMPARISON:  Chest radiograph Dec 24, 2015 and PET-CT July 06, 2016 FINDINGS: There is loculated pleural effusion on the left with atelectasis in left base. There is consolidation in the left upper lobe posterior segment as well as in portions of the left lower lobe medially. On the right, there is atelectatic change in the medial right base region. Lungs elsewhere clear. There is mild cardiomegaly with pulmonary vascularity within normal limits. No adenopathy. No bone lesions appreciable. Port-A-Cath tip at cavoatrial junction. No pneumothorax. IMPRESSION: Areas of consolidation on the left, primarily in the left upper lobe, posterior segment. Loculated pleural effusion on the left with atelectatic change. Atelectasis right perihilar region. Stable cardiomegaly. No adenopathy by radiography apparent. No pneumothorax. Electronically Signed   By: Lowella Grip III M.D.   On: 09/22/2016 15:47   Dg Abd 1 View  Result Date: 09/23/2016 CLINICAL DATA:  Lower abdominal pain EXAM: ABDOMEN - 1 VIEW COMPARISON:  PET-CT 07/06/2016 FINDINGS: Degenerative disc disease L2-3 with L4-5 right-sided facet arthropathy and sclerosis. Bowel gas pattern is unremarkable. There is no free air, bowel obstruction, organomegaly nor suspicious calculi. IMPRESSION: Unremarkable bowel gas pattern. Degenerative disc and facet  arthropathy of the lumbar spine. Electronically Signed   By: Ashley Royalty M.D.   On: 09/23/2016 20:06   Ct Angio Chest Pe W And/or Wo Contrast  Result Date: 09/22/2016 CLINICAL DATA:  56 year old female with lung cancer. Patient is on chemotherapy with last treatment 8 days ago. Diagnosed with anemia last week beginning transfusion. Worsening of dyspnea. History of pericardial effusion without physiologic effect. EXAM: CT ANGIOGRAPHY CHEST WITH CONTRAST TECHNIQUE: Multidetector CT imaging of the chest was performed using the standard protocol during bolus administration of intravenous contrast. Multiplanar CT image reconstructions and MIPs were obtained to evaluate the vascular anatomy. CONTRAST:  65 cc Isovue 370 COMPARISON:  Chest CT 11/25/2015, CXR 09/22/2016 FINDINGS: Cardiovascular: Acute bilateral lower lobe segmental and subsegmental nonocclusive pulmonary emboli without right heart strain. RV/ LV ratio equals 0.67. Cardiomegaly with circumferential pericardial effusion measuring up to 3.1 cm in thickness no aortic aneurysm or dissection. Coronary arteriosclerosis. Port catheter tip projects within the proximal right atrium. Aberrant right subclavian artery. Mediastinum/Nodes: No mediastinal lymphadenopathy. No axillary or supraclavicular adenopathy. Calcified right 1.7 cm thyroid nodule. Lungs/Pleura: Moderate loculated left pleural effusion with adjacent areas of pulmonary atelectasis and consolidation. Small right pleural effusion. Left hilar masslike opacity noted on prior exam is obscured by adjacent atelectasis and pleural fluid centrilobular emphysema is identified upper lobe predominant. Hazy ground-glass opacities of the lungs may reflect sequela of CHF. Upper Abdomen: No acute abnormality. Musculoskeletal: Sclerotic appearance of the T4 vertebral body concerning for osseous metastatic disease. Degenerative disc space narrowing from T3 through T8 with small posterior marginal osteophytes at T5-6  and T6-7. Slightly sclerotic appearance of the C6 and C7 vertebral bodies are also noted. Review of the MIP images confirms the above findings. IMPRESSION: 1. Bilateral nonocclusive segmental and subsegmental pulmonary emboli without right heart strain. RV/LV ratio equals 0.67. 2. Loculated left pleural effusion with adjacent compressive atelectasis. Mild CHF. 3. Cardiomegaly with large circumferential pericardial effusion measuring up to 3.1 cm in thickness. 4. Sclerotic appearance of several visualized lower cervical and mid thoracic vertebrae concerning  for osteoblastic disease in particular T4. The other areas of sclerosis may be due to degenerative disc disease and discogenic sclerosis. Mixed sclerotic and lytic appearance of the right sternum at the sternoclavicular joint is more likely related to osteoarthritic degenerative change. 5. Critical Value/emergent results were called by telephone at the time of interpretation on 09/22/2016 at 6:01 pm to Dr. Zenovia Jarred , who verbally acknowledged these results. Electronically Signed   By: Ashley Royalty M.D.   On: 09/22/2016 18:01    ECG & Cardiac Imaging    EKG: 2/27 ST, p waves noted in most leads  Echo: 08/18/16  Study Conclusions  - Left ventricle: The cavity size was normal. There was moderate   concentric hypertrophy. Systolic function was normal. The   estimated ejection fraction was in the range of 60% to 65%. Wall   motion was normal; there were no regional wall motion   abnormalities. Doppler parameters are consistent with abnormal   left ventricular relaxation (grade 1 diastolic dysfunction). The   E;e&' ratio is between 8-15, suggesting indeterminate LV filling   prsesure. - Aortic valve: Trileaflet. Sclerosis without stenosis. There was   trivial regurgitation. - Left atrium: The atrium was normal in size. - Tricuspid valve: There was trivial regurgitation. - Pulmonary arteries: PA peak pressure: 16 mm Hg (S). - Inferior vena  cava: The vessel was normal in size. The   respirophasic diameter changes were in the normal range (= 50%),   consistent with normal central venous pressure. - Pericardium, extracardiac: Moderate sized circumferential   pericardial effusion. Features were not consistent with tamponade   physiology.  Impressions:  - Compared to a prior study in 06/2016, there is a stable   moderate-sized circumferential pericardial effusion without   tamponade physiology.  Assessment & Plan    56 yo female with PMH of Non-small cell Lung Ca, COPD, HTN, HL, NIDDM who presented with worsening dyspnea, and found to have bilateral PEs and loculated pleural effusion.   1. Pericardial Effusion: Noted as moderate size without tamponade on echo from 08/18/16. Given her increased dyspnea this admission, there is concern that is may have worsened. No pericardial rub on exam, but she is slightly tachycardic. No currently on telemetry. CTA reported as a large effusion.  -- repeat echo has been ordered and read is pending. Further recommendations based on echo results.   2. Bilateral PEs: Noted on CTA. Has been started on IV heparin, but given 2 doses of Xarelto yesterday. Ok with NOAC once past this acute state, would continue heparin for now assuming she may need invasive procedures. Montior CBC closely with anemia and hx of GI bleed.  3. Left loculated Pleural Effusion: Was given several doses of IV lasix. Net - 1.9 L  4. Anemia: Hgb remains at 8 today. Reports having a transfusion prior to admission, but does not seem to have improved. Oncology and primary following.   5. Non small cell lung Ca: Being seen by Dr. Marin Olp.   6. Dyspnea: Multifactorial, though there is concern that her pericardial effusion may be contributing. Follow up echo pending.   Barnet Pall, NP-C Pager [phone] 09/24/2016, 9:44 AM   Called to see the patient by Dr. Venetia Constable for cardiac tamponade, found on echo done earlier  today.    I have examined the patient and reviewed assessment and plan and discussed with patient.  Agree with above as stated. I personally reviewed the echo images.  There is a large pericardial effusion causing  tamponade.  Complex patient with lung cancer treated with chemotherapy in the past.  She has had a pericardial effusion which has increased in size and now appears to be causing tamponade.  I suspect this is more likely a malignant effusion than a hemorrhagic effusion given the time course.  It is possible that the anticoagulation added to the existing effusion.    She will need this effusion drained.  Of note, she also has a loculated pleural effusion.  Xarelto was given yesterday for PE, making the bleeding risk of any procedure increased.  She will need long term anticoagulation after the fluid is removed.  Given risk of reaccumulation of fluid, will consult cardiac surgery for possible pericardial window =/- management of loculated effusion.    Will transfer her to [facility].  Medicine looking at restarting heparin based on PTT. At least this could be stopped and the effect would be gone quickly.  Currently BP is stable.  HR has been increased.  NO change in pulse amplitude with deep breathing.  She feels ok if she stays still.  Walking causes her SHOB.  I explained the possible treatments with her and she is agreeable.   Would not aggressively diurese while in tamponade as dehydration could cause hypotension.  Plan discussed with Dr. Venetia Constable.     Larae Grooms

## 2016-09-24 NOTE — Progress Notes (Signed)
eLink Physician-Brief Progress Note Patient Name: Carol Buchanan DOB: 1961/05/06 MRN: [id number]   Date of Service  09/24/2016  HPI/Events of Note  New patient eval COPD, lung cancer, pericardial effusion Transferred to [facility] Stable on camera check  eICU Interventions  No eICU intervention     Intervention Category Evaluation Type: New Patient Evaluation  Simonne Maffucci 09/24/2016, 3:40 PM

## 2016-09-24 NOTE — Progress Notes (Signed)
[place] for Heparin Indication: pulmonary embolus  No Known Allergies  Patient Measurements: Height: '5\' 4"'$  (162.6 cm) Weight: 180 lb 3.2 oz (81.7 kg) IBW/kg (Calculated) : 54.7 Heparin Dosing Weight: 73 kg  Vital Signs: Temp: 97.4 F (36.3 C) (03/01 0441) Temp Source: Oral (03/01 0441) BP: 141/84 (03/01 0441) Pulse Rate: 102 (03/01 0441)  Labs:  Recent Labs  09/22/16 1636 09/23/16 0201 09/23/16 1016 09/24/16 0502  HGB 8.0* 8.1*  --  8.1*  HCT 23.8* 23.8*  --  24.4*  PLT 134* 127*  --  141*  APTT 48*  --   --  77*  LABPROT 14.8  --   --   --   INR 1.15  --   --   --   HEPARINUNFRC  --  <0.10* >2.20* >2.20*  CREATININE 1.16* 1.34*  --  1.42*   Estimated Creatinine Clearance: 45.7 mL/min (by C-G formula based on SCr of 1.42 mg/dL (H)).  Medical History: Past Medical History:  Diagnosis Date  . COPD (chronic obstructive pulmonary disease) ([place])   . DM (diabetes mellitus), type 2, uncontrolled ([place]) 11/26/2015  . Hyperlipidemia   . Hypertension   . Neuropathy ([place])   . Primary lung cancer with metastasis from lung to other site [facility]) 07/26/2015  . 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 Sinus trouble     Assessment: 40 y/oF with PMH of lung cancer (last dose of Alimta on 09/14/16), anemia (s/p blood transfusion last week), COPD, DM, HLD, HTN who presented to [facility] ED on 09/22/16 with worsening shortness of breath. CTa of chest + for PE. Pharmacy consulted to start heparin infusion on admission. Patient not on any anticoagulants PTA. Admission CBC revealed low Hgb at 8, low Pltc at 134K. PT/INR WNL, aPTT elevated at 48 seconds. No active bleeding reported.  Today, 09/24/16:  Patient transitioned to [place] 2/28 - received 2 doses yesterday 1300 & 1700  Xarelto discontinued 2/28 pm, planned resume Heparin with hx of anemia, GI bleed, possible need for invasive  procedures  CBC: Hgb, Pltc low, stable  No bleeding issues reported  CrCl ~ 45 ml/min  APTT this am 77 seconds - still anticoagulated per Xarelto,   Heparin level supratherapeutic due to Xarelto falsely elevating level  Goal of Therapy:  Heparin level 0.3-0.7 units/ml  APTT goal 66-102 seconds Monitor platelets by anticoagulation protocol: Yes   Plan:   Will recheck heparin level and aPTT at 1300  Xarelto can falsely elevate the heparin level. If heparin level elevated, will use aPTT to adjust heparin infusion until both heparin level and aPTT correlate and thus effects of Xarelto have diminished.   Daily CBC while on heparin infusion.  Monitor for s/sx of bleeding.  Minda Ditto PharmD Pager [phone] 09/24/2016, 11:06 AM

## 2016-09-24 NOTE — Progress Notes (Signed)
Pt transported to [facility] via [facility]. VSS at time of transport and no acute changes noted with Pt's assessment at time of transfer.

## 2016-09-24 NOTE — Progress Notes (Signed)
Patient arrived to 4n19. VSS. Pt experiencing SOB with exertion, O2 satuation mid 90's on 4L.  Call bell within reach. MD paged to notify of arrival. Will continue to monitor.

## 2016-09-24 NOTE — Progress Notes (Signed)
ANTICOAGULATION CONSULT NOTE  Pharmacy Consult for Heparin Indication: pulmonary embolus  No Known Allergies  Patient Measurements: Height: '5\' 4"'$  (162.6 cm) Weight: 180 lb (81.6 kg) IBW/kg (Calculated) : 54.7 Heparin Dosing Weight: 73 kg  Vital Signs: Temp: 97.8 F (36.6 C) (03/01 1442) Temp Source: Oral (03/01 1442) BP: 140/84 (03/01 1500) Pulse Rate: 109 (03/01 1500)  Labs:  Recent Labs  09/22/16 1636 09/23/16 0201 09/23/16 1016 09/24/16 0502 09/24/16 1626  HGB 8.0* 8.1*  --  8.1*  --   HCT 23.8* 23.8*  --  24.4*  --   PLT 134* 127*  --  141*  --   APTT 48*  --   --  77* 61*  LABPROT 14.8  --   --   --   --   INR 1.15  --   --   --   --   HEPARINUNFRC  --  <0.10* >2.20* >2.20*  --   CREATININE 1.16* 1.34*  --  1.42*  --    Estimated Creatinine Clearance: 45.7 mL/min (by C-G formula based on SCr of 1.42 mg/dL (H)).  Medical History: Past Medical History:  Diagnosis Date  . COPD (chronic obstructive pulmonary disease) ([place])   . DM (diabetes mellitus), type 2, uncontrolled ([place]) 11/26/2015  . Hyperlipidemia   . Hypertension   . Neuropathy ([place])   . Primary lung cancer with metastasis from lung to other site [facility]) 07/26/2015  . 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 Sinus trouble     Assessment: 77 y/oF with PMH of lung cancer (last dose of Alimta on 09/14/16), anemia (s/p blood transfusion last week), COPD, DM, HLD, HTN who presented to [facility] ED on 09/22/16 with worsening shortness of breath. CTa of chest + for PE. Pharmacy consulted to start heparin infusion on admission. Patient not on any anticoagulants PTA. Admission CBC revealed low Hgb at 8, low Pltc at 134K. PT/INR WNL, baseline aPTT elevated at 48 seconds. No active bleeding reported.   Patient transitioned to [place] 2/28 - received 2 doses yesterday 1300 & 1700  Xarelto discontinued 2/28 pm, planned resume Heparin with hx of anemia, GI bleed,  possible need for invasive procedures  CBC: Hgb 8.1 stable, Pltc low, stable  No bleeding issues reported  CrCl ~ 45 ml/min  No heparin charted as hanging/running since 2/27  APTT this am 77 seconds - still anticoagulated per Xarelto, no heparin running at this time per MAG  Heparin level supratherapeutic due to Xarelto falsely elevating level  3/1 aPTT  = 61 seconds- no heparin running. Still elevated from xarelto   Goal of Therapy:  Heparin level 0.3-0.7 units/ml  APTT goal 66-102 seconds Monitor platelets by anticoagulation protocol: Yes   Plan:  Start heparin drip at 1200 units/hr with no bolus and check aPTT in 6 hours  Xarelto falsely elevates the heparin level. If heparin level elevated, will use aPTT to adjust heparin infusion until both heparin level and aPTT correlate and thus effects of Xarelto have diminished.   Daily CBC while on heparin infusion.  Monitor for s/sx of bleeding.  Eudelia Bunch, Pharm.D. [phone] 09/24/2016 5:14 PM

## 2016-09-24 NOTE — Progress Notes (Signed)
  Echocardiogram 2D Echocardiogram has been performed.  Carol Buchanan 09/24/2016, 8:28 AM

## 2016-09-24 NOTE — Progress Notes (Addendum)
[facility] PROGRESS NOTE    Progress Note  Carol Buchanan  UXN:[id number] DOB: [birth year] DOA: 09/22/2016 PCP: Andria Frames, MD     Brief Narrative:   Carol Buchanan is an 56 y.o. 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  Was treated with XRT initially and has been on chemoRx since then, f/b Dr Marin Olp.  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  Presenting with one month hx worsening SOB.  CT angio of chest in ED today showed bilat segmental and subsegmental pulm emboli, no R heart strain,  Assessment/Plan:   Acute Pulmonary embolism: Due to her history of pericardial effusion her Xarelto was DC'd and heparin is to be resumed. Cont to hold anticoagulation. May need surgical intervention.  Chronic diastolic heart failure: Hold IV Lasix. She remains negative. Her creatinine keeps rising. Strict I and O's. Daily weights.  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 With a prior history of pneumonitis (prob from [place]). Now only  On Alimta (pemetrexed)  COPD (chronic obstructive pulmonary disease) (HCC)  Essential hypertension  Pneumonitis, from immunomodulator  Chronic respiratory failure ([place]), on home O2 She relates her respirations are not improved.  Pericardial effusion: Concern about malignant effusion, 2-D echo showed physiologic tamponade, cardiology was consulted. She remains with mild tachycardic, she is not hypotensive. [facility], CVTS to see, for possible pericardial window.  DVT prophylaxis: Heaprin Family Communication:nonme Disposition Plan/Barrier to D/C: [facility] to [facility] Code Status:     Code Status Orders        Start     Ordered   09/22/16 2140  Full code  Continuous     09/22/16 2139    Code Status History    Date Active Date Inactive Code Status Order ID Comments  User Context   12/27/2015 10:42 AM 12/28/2015  5:30 PM Full Code [id number]  Volanda Napoleon, MD Inpatient   11/26/2015  2:03 AM 12/02/2015  3:42 PM DNR [id number]  Toy Baker, MD Inpatient    Advance Directive Documentation   Flowsheet Row Most Recent Value  Type of Advance Directive  Living will  Pre-existing out of facility DNR order (yellow form or pink MOST form)  No data  "MOST" Form in Place?  No data        IV Access:    Peripheral IV   Procedures and diagnostic studies:   Dg Chest 2 View  Result Date: 09/22/2016 CLINICAL DATA:  Shortness of breath.  History of lung carcinoma EXAM: CHEST  2 VIEW COMPARISON:  Chest radiograph Dec 24, 2015 and PET-CT July 06, 2016 FINDINGS: There is loculated pleural effusion on the left with atelectasis in left base. There is consolidation in the left upper lobe posterior segment as well as in portions of the left lower lobe medially. On the right, there is atelectatic change in the medial right base region. Lungs elsewhere clear. There is mild cardiomegaly with pulmonary vascularity within normal limits. No adenopathy. No bone lesions appreciable. Port-A-Cath tip at cavoatrial junction. No pneumothorax. IMPRESSION: Areas of consolidation on the left, primarily in the left upper lobe, posterior segment. Loculated pleural effusion on the left with atelectatic change. Atelectasis right perihilar region. Stable cardiomegaly. No adenopathy by radiography apparent. No pneumothorax. Electronically Signed   By: Lowella Grip III M.D.   On: 09/22/2016 15:47   Dg  Abd 1 View  Result Date: 09/23/2016 CLINICAL DATA:  Lower abdominal pain EXAM: ABDOMEN - 1 VIEW COMPARISON:  PET-CT 07/06/2016 FINDINGS: Degenerative disc disease L2-3 with L4-5 right-sided facet arthropathy and sclerosis. Bowel gas pattern is unremarkable. There is no free air, bowel obstruction, organomegaly nor suspicious calculi. IMPRESSION: Unremarkable bowel gas pattern. Degenerative  disc and facet arthropathy of the lumbar spine. Electronically Signed   By: Ashley Royalty M.D.   On: 09/23/2016 20:06   Ct Angio Chest Pe W And/or Wo Contrast  Result Date: 09/22/2016 CLINICAL DATA:  56 year old female with lung cancer. Patient is on chemotherapy with last treatment 8 days ago. Diagnosed with anemia last week beginning transfusion. Worsening of dyspnea. History of pericardial effusion without physiologic effect. EXAM: CT ANGIOGRAPHY CHEST WITH CONTRAST TECHNIQUE: Multidetector CT imaging of the chest was performed using the standard protocol during bolus administration of intravenous contrast. Multiplanar CT image reconstructions and MIPs were obtained to evaluate the vascular anatomy. CONTRAST:  65 cc Isovue 370 COMPARISON:  Chest CT 11/25/2015, CXR 09/22/2016 FINDINGS: Cardiovascular: Acute bilateral lower lobe segmental and subsegmental nonocclusive pulmonary emboli without right heart strain. RV/ LV ratio equals 0.67. Cardiomegaly with circumferential pericardial effusion measuring up to 3.1 cm in thickness no aortic aneurysm or dissection. Coronary arteriosclerosis. Port catheter tip projects within the proximal right atrium. Aberrant right subclavian artery. Mediastinum/Nodes: No mediastinal lymphadenopathy. No axillary or supraclavicular adenopathy. Calcified right 1.7 cm thyroid nodule. Lungs/Pleura: Moderate loculated left pleural effusion with adjacent areas of pulmonary atelectasis and consolidation. Small right pleural effusion. Left hilar masslike opacity noted on prior exam is obscured by adjacent atelectasis and pleural fluid centrilobular emphysema is identified upper lobe predominant. Hazy ground-glass opacities of the lungs may reflect sequela of CHF. Upper Abdomen: No acute abnormality. Musculoskeletal: Sclerotic appearance of the T4 vertebral body concerning for osseous metastatic disease. Degenerative disc space narrowing from T3 through T8 with small posterior marginal  osteophytes at T5-6 and T6-7. Slightly sclerotic appearance of the C6 and C7 vertebral bodies are also noted. Review of the MIP images confirms the above findings. IMPRESSION: 1. Bilateral nonocclusive segmental and subsegmental pulmonary emboli without right heart strain. RV/LV ratio equals 0.67. 2. Loculated left pleural effusion with adjacent compressive atelectasis. Mild CHF. 3. Cardiomegaly with large circumferential pericardial effusion measuring up to 3.1 cm in thickness. 4. Sclerotic appearance of several visualized lower cervical and mid thoracic vertebrae concerning for osteoblastic disease in particular T4. The other areas of sclerosis may be due to degenerative disc disease and discogenic sclerosis. Mixed sclerotic and lytic appearance of the right sternum at the sternoclavicular joint is more likely related to osteoarthritic degenerative change. 5. Critical Value/emergent results were called by telephone at the time of interpretation on 09/22/2016 at 6:01 pm to Dr. Zenovia Jarred , who verbally acknowledged these results. Electronically Signed   By: Ashley Royalty M.D.   On: 09/22/2016 18:01     Medical Consultants:    None.  Anti-Infectives:   None  Subjective:    Carol Buchanan she relates her breathing is unchanged.  Objective:    Vitals:   09/23/16 2030 09/24/16 0441 09/24/16 0607 09/24/16 0837  BP: 132/71 (!) 141/84    Pulse: (!) 118 (!) 102    Resp: 18 18    Temp: 98.6 F (37 C) 97.4 F (36.3 C)    TempSrc: Oral Oral    SpO2: 96% 98%  94%  Weight:   81.7 kg (180 lb 3.2 oz)   Height:  Intake/Output Summary (Last 24 hours) at 09/24/16 0954 Last data filed at 09/24/16 6269  Gross per 24 hour  Intake           111.98 ml  Output             1000 ml  Net          -888.02 ml   Filed Weights   09/22/16 2137 09/23/16 0427 09/24/16 0607  Weight: 85 kg (187 lb 6.4 oz) 83.8 kg (184 lb 11.2 oz) 81.7 kg (180 lb 3.2 oz)    Exam: General exam: In no acute  distress. Respiratory system: Moderate air movement with diffuse crackles bilaterally no wheezing. Cardiovascular system: S1 & S2 heard, RRR. -JVD Gastrointestinal system: Abdomen is nondistended, soft and nontender.  Extremities: No pedal edema. Skin: No rashes, lesions or ulcers Psychiatry: Judgement and insight appear normal. Mood & affect appropriate.    Data Reviewed:    Labs: Basic Metabolic Panel:  Recent Labs Lab 09/22/16 1636 09/23/16 0201 09/24/16 0502  NA 134* 134* 138  K 2.9* 3.0* 3.2*  CL 93* 94* 92*  CO2 31 27 34*  GLUCOSE 89 111* 107*  BUN 20 19 21*  CREATININE 1.16* 1.34* 1.42*  CALCIUM 8.9 8.8* 9.0   GFR Estimated Creatinine Clearance: 45.7 mL/min (by C-G formula based on SCr of 1.42 mg/dL (H)). Liver Function Tests:  Recent Labs Lab 09/22/16 1636  AST 40  ALT 41  ALKPHOS 157*  BILITOT 0.5  PROT 7.2  ALBUMIN 3.2*   No results for input(s): LIPASE, AMYLASE in the last 168 hours. No results for input(s): AMMONIA in the last 168 hours. Coagulation profile  Recent Labs Lab 09/22/16 1636  INR 1.15    CBC:  Recent Labs Lab 09/22/16 1636 09/23/16 0201 09/24/16 0502  WBC 1.6* 1.5* 2.5*  NEUTROABS 0.8* 0.7*  --   HGB 8.0* 8.1* 8.1*  HCT 23.8* 23.8* 24.4*  MCV 97.9 100.0 99.6  PLT 134* 127* 141*   Cardiac Enzymes: No results for input(s): CKTOTAL, CKMB, CKMBINDEX, TROPONINI in the last 168 hours. BNP (last 3 results) No results for input(s): PROBNP in the last 8760 hours. CBG:  Recent Labs Lab 09/24/16 0611  GLUCAP 103*   D-Dimer: No results for input(s): DDIMER in the last 72 hours. Hgb A1c: No results for input(s): HGBA1C in the last 72 hours. Lipid Profile: No results for input(s): CHOL, HDL, LDLCALC, TRIG, CHOLHDL, LDLDIRECT in the last 72 hours. Thyroid function studies: No results for input(s): TSH, T4TOTAL, T3FREE, THYROIDAB in the last 72 hours.  Invalid input(s): FREET3 Anemia work up: No results for input(s):  VITAMINB12, FOLATE, FERRITIN, TIBC, IRON, RETICCTPCT in the last 72 hours. Sepsis Labs:  Recent Labs Lab 09/22/16 1636 09/22/16 1647 09/23/16 0201 09/24/16 0502  WBC 1.6*  --  1.5* 2.5*  LATICACIDVEN  --  0.91  --   --    Microbiology No results found for this or any previous visit (from the past 240 hour(s)).   Medications:   . DULoxetine  60 mg Oral Daily  . fluticasone  2 spray Each Nare Daily  . folic acid  1 mg Oral Daily  . furosemide  20 mg Intravenous Q12H  . ipratropium-albuterol  3 mL Nebulization TID  . montelukast  10 mg Oral QHS  . pantoprazole  40 mg Oral Daily  . potassium chloride SA  20 mEq Oral Daily  . potassium chloride  40 mEq Oral Once  . simvastatin  20 mg  Oral q1800  . sodium chloride flush  3 mL Intravenous Q12H  . sodium chloride flush  3 mL Intravenous Q12H  . vitamin C  1,000 mg Oral Daily   Continuous Infusions:   Time spent: 35 min   LOS: 2 days   Charlynne Cousins  [facility] Pager [phone]  *Please refer to [email or web address], password [email or web address] to get updated schedule on who will round on this patient, as hospitalists switch teams weekly. If 7PM-7AM, please contact night-coverage at [email or web address], password [email or web address] for any overnight needs.  09/24/2016, 9:54 AM

## 2016-09-24 NOTE — Progress Notes (Signed)
Carol Buchanan is about the same. She still has some shortness of breath. She says she was able to sleep a little bit last night.  The heparin has not been restarted. I think we have to get her back on heparin as I worry about her needing a pericardiocentesis. We will have the echocardiogram done today. I think cardiology needs to be involved. Again, I think she has quite a bit of fluid around the heart. I think this is affecting her heart and also affecting her breathing. If she is going to be on blood thinner, we have to make sure that she is not bleeding into the pericardial sac.  No labs are back yet today. She may need to be transfused if her hemoglobin is not any better.  On her physical exam, her temperature is 97.4. Pulse is 102. Blood pressure 141/84. Oxygen saturation is 98%. Head neck exam shows no ocular or oral lesions. There is no thrush. There is no adenopathy in the neck. Lungs are with bilateral wheezes and crackles. Cardiac exam tachycardic but regular. Abdomen is obese but soft. Extremities shows no clubbing, cyanosis or edema.  Carol Buchanan has the bilateral pulmonary emboli. To me however, is this fluid around the heart. I think this is just as clinically significant as the pulmonary emboli. As such, I think that we have to get this fluid taken off. We will see what the echocardiogram shows.  She will be back on heparin. I think this is a very good idea for her. I believe this is much safer. She has a history of GI bleeding. As such, we had to have her on something long-term that she can have reversed if there is a bleeding issue. This might be Lovenox.  I appreciate all the great care that she is getting from the nurses up on 4 E.  Lattie Haw, MD  Darlyn Chamber 1:19

## 2016-09-24 NOTE — Progress Notes (Signed)
Patient to bedside commode with assistance. While ambulation patient HR ST110- 120's but patient became very SOB and diaphoretic. Pt assisted back to bed. Education given on future use of bedpan d/t sob with exertion. Patient agreeable. Will continue to monitor.

## 2016-09-24 NOTE — Progress Notes (Signed)
Pt to transfer to [facility]. Report called to [facility].

## 2016-09-25 ENCOUNTER — Inpatient Hospital Stay (HOSPITAL_COMMUNITY): Payer: Medicare Other

## 2016-09-25 DIAGNOSIS — I314 Cardiac tamponade: Secondary | ICD-10-CM

## 2016-09-25 LAB — BLOOD GAS, ARTERIAL
Acid-Base Excess: 7.3 mmol/L — ABNORMAL HIGH (ref 0.0–2.0)
Bicarbonate: 30.9 mmol/L — ABNORMAL HIGH (ref 20.0–28.0)
Drawn by: 244851
O2 Content: 5 L/min
O2 Saturation: 97.6 %
Patient temperature: 98.6
pCO2 arterial: 41.4 mmHg (ref 32.0–48.0)
pH, Arterial: 7.487 — ABNORMAL HIGH (ref 7.350–7.450)
pO2, Arterial: 92.7 mmHg (ref 83.0–108.0)

## 2016-09-25 LAB — COMPREHENSIVE METABOLIC PANEL
ALT: 32 U/L (ref 14–54)
ALT: 34 U/L (ref 14–54)
AST: 31 U/L (ref 15–41)
AST: 36 U/L (ref 15–41)
Albumin: 2.7 g/dL — ABNORMAL LOW (ref 3.5–5.0)
Albumin: 2.9 g/dL — ABNORMAL LOW (ref 3.5–5.0)
Alkaline Phosphatase: 142 U/L — ABNORMAL HIGH (ref 38–126)
Alkaline Phosphatase: 145 U/L — ABNORMAL HIGH (ref 38–126)
Anion gap: 14 (ref 5–15)
Anion gap: 15 (ref 5–15)
BUN: 15 mg/dL (ref 6–20)
BUN: 15 mg/dL (ref 6–20)
CO2: 31 mmol/L (ref 22–32)
CO2: 27 mmol/L (ref 22–32)
Calcium: 9 mg/dL (ref 8.9–10.3)
Calcium: 8.9 mg/dL (ref 8.9–10.3)
Chloride: 94 mmol/L — ABNORMAL LOW (ref 101–111)
Chloride: 93 mmol/L — ABNORMAL LOW (ref 101–111)
Creatinine, Ser: 1.54 mg/dL — ABNORMAL HIGH (ref 0.44–1.00)
Creatinine, Ser: 1.4 mg/dL — ABNORMAL HIGH (ref 0.44–1.00)
GFR calc Af Amer: 42 mL/min — ABNORMAL LOW (ref >60)
GFR calc Af Amer: 48 mL/min — ABNORMAL LOW (ref >60)
GFR calc non Af Amer: 37 mL/min — ABNORMAL LOW (ref >60)
GFR calc non Af Amer: 41 mL/min — ABNORMAL LOW (ref >60)
Glucose, Bld: 98 mg/dL (ref 65–99)
Glucose, Bld: 138 mg/dL — ABNORMAL HIGH (ref 65–99)
Potassium: 3.9 mmol/L (ref 3.5–5.1)
Potassium: 3.4 mmol/L — ABNORMAL LOW (ref 3.5–5.1)
Sodium: 139 mmol/L (ref 135–145)
Sodium: 135 mmol/L (ref 135–145)
Total Bilirubin: 0.9 mg/dL (ref 0.3–1.2)
Total Bilirubin: 0.5 mg/dL (ref 0.3–1.2)
Total Protein: 6.8 g/dL (ref 6.5–8.1)
Total Protein: 6.9 g/dL (ref 6.5–8.1)

## 2016-09-25 LAB — URINALYSIS, ROUTINE W REFLEX MICROSCOPIC
Bilirubin Urine: NEGATIVE
Glucose, UA: NEGATIVE mg/dL
Hgb urine dipstick: NEGATIVE
Ketones, ur: NEGATIVE mg/dL
Leukocytes, UA: NEGATIVE
Nitrite: NEGATIVE
Protein, ur: 30 mg/dL — AB
Specific Gravity, Urine: 1.012 (ref 1.005–1.030)
pH: 6 (ref 5.0–8.0)

## 2016-09-25 LAB — PROTIME-INR
INR: 1.3
INR: 1.18
Prothrombin Time: 16.3 seconds — ABNORMAL HIGH (ref 11.4–15.2)
Prothrombin Time: 15.1 seconds (ref 11.4–15.2)

## 2016-09-25 LAB — APTT
APTT: 60 s — AB (ref 24–36)
aPTT: 65 seconds — ABNORMAL HIGH (ref 24–36)
aPTT: 80 seconds — ABNORMAL HIGH (ref 24–36)

## 2016-09-25 LAB — CBC
HCT: 26.4 % — ABNORMAL LOW (ref 36.0–46.0)
HEMATOCRIT: 25.2 % — AB (ref 36.0–46.0)
HEMOGLOBIN: 8.2 g/dL — AB (ref 12.0–15.0)
Hemoglobin: 8.6 g/dL — ABNORMAL LOW (ref 12.0–15.0)
MCH: 33.6 pg (ref 26.0–34.0)
MCH: 33.6 pg (ref 26.0–34.0)
MCHC: 32.5 g/dL (ref 30.0–36.0)
MCHC: 32.6 g/dL (ref 30.0–36.0)
MCV: 103.3 fL — AB (ref 78.0–100.0)
MCV: 103.1 fL — ABNORMAL HIGH (ref 78.0–100.0)
Platelets: 147 10*3/uL — ABNORMAL LOW (ref 150–400)
Platelets: 170 10*3/uL (ref 150–400)
RBC: 2.44 MIL/uL — ABNORMAL LOW (ref 3.87–5.11)
RBC: 2.56 MIL/uL — ABNORMAL LOW (ref 3.87–5.11)
RDW: 17.5 % — AB (ref 11.5–15.5)
RDW: 17.2 % — ABNORMAL HIGH (ref 11.5–15.5)
WBC: 3.2 10*3/uL — ABNORMAL LOW (ref 4.0–10.5)
WBC: 3.3 10*3/uL — ABNORMAL LOW (ref 4.0–10.5)

## 2016-09-25 LAB — HEPARIN LEVEL (UNFRACTIONATED): Heparin Unfractionated: 0.61 IU/mL (ref 0.30–0.70)

## 2016-09-25 LAB — PREPARE RBC (CROSSMATCH)

## 2016-09-25 MED ORDER — LORAZEPAM 0.5 MG PO TABS
0.5000 mg | ORAL_TABLET | Freq: Four times a day (QID) | ORAL | Status: DC | PRN
  Administered 2016-09-25 – 2016-10-01 (×7): 0.5 mg via ORAL
  Filled 2016-09-25 (×7): qty 1

## 2016-09-25 MED ORDER — DEXTROSE 5 % IV SOLN
1.5000 g | INTRAVENOUS | Status: AC
  Administered 2016-09-26: 1.5 g via INTRAVENOUS
  Filled 2016-09-25: qty 1.5

## 2016-09-25 MED ORDER — SENNOSIDES-DOCUSATE SODIUM 8.6-50 MG PO TABS
1.0000 | ORAL_TABLET | Freq: Two times a day (BID) | ORAL | Status: DC
  Administered 2016-09-25 – 2016-10-02 (×13): 1 via ORAL
  Filled 2016-09-25 (×13): qty 1

## 2016-09-25 MED ORDER — CHLORHEXIDINE GLUCONATE CLOTH 2 % EX PADS
6.0000 | MEDICATED_PAD | Freq: Every day | CUTANEOUS | Status: DC
  Administered 2016-09-25: 6 via TOPICAL

## 2016-09-25 MED ORDER — POLYETHYLENE GLYCOL 3350 17 G PO PACK
17.0000 g | PACK | Freq: Two times a day (BID) | ORAL | Status: DC
  Administered 2016-09-25 – 2016-09-28 (×6): 17 g via ORAL
  Filled 2016-09-25 (×9): qty 1

## 2016-09-25 NOTE — Care Management Note (Addendum)
Case Management Note  Patient Details  Name: Carol Buchanan MRN: [id number] Date of Birth: [birth year]  Subjective/Objective:  Pt lives with her mother - states they take care of each other and that her mother is in good health.  Has home O2 through [employer].  Pt will be on Xarelto - per staff, pharmacy will provide educational material and 30-day free trial card when Xarelto is started.                         Expected Discharge Plan:  Home/Self Care  Discharge planning Services  CM Consult  Status of Service:  In process, will continue to follow  Girard Cooter, RN 09/25/2016, 3:15 PM

## 2016-09-25 NOTE — Progress Notes (Signed)
ANTICOAGULATION CONSULT NOTE - Follow Up Consult  Pharmacy Consult for Heparin Indication: pulmonary embolus  Allergies  Allergen Reactions  . No Known Allergies     Patient Measurements: Height: '5\' 4"'$  (162.6 cm) Weight: 183 lb 10.3 oz (83.3 kg) IBW/kg (Calculated) : 54.7  Vital Signs: Temp: 98 F (36.7 C) (03/02 1200) Temp Source: Oral (03/02 1200) BP: 147/83 (03/02 2100) Pulse Rate: 109 (03/02 2100)  Labs:  Recent Labs  09/23/16 1016 09/24/16 0502  09/25/16 0051 09/25/16 0327 09/25/16 1036 09/25/16 1425 09/25/16 2001  HGB  --  8.1*  --   --  8.2*  --  8.6*  --   HCT  --  24.4*  --   --  25.2*  --  26.4*  --   PLT  --  141*  --   --  147*  --  170  --   APTT  --  77*  < > 60*  --  65*  --  80*  LABPROT  --   --   --   --  16.3*  --  15.1  --   INR  --   --   --   --  1.30  --  1.18  --   HEPARINUNFRC >2.20* >2.20*  --   --   --  0.61  --   --   CREATININE  --  1.42*  --   --  1.54*  --  1.40*  --   < > = values in this interval not displayed.  Estimated Creatinine Clearance: 46.8 mL/min (by C-G formula based on SCr of 1.4 mg/dL (H)).  Assessment: 71 y/oF with PMH of lung cancer (last dose of Alimta on 09/14/16), anemia (s/p blood transfusion last week), COPD, DM, HLD, HTN who presented to [facility] ED on 09/22/16 with worsening shortness of breath. CTa of chest + for PE. Pharmacy consulted to start heparin infusion on admission. Patient not on any anticoagulants PTA. Patient was transitioned to Xarelto on 2/28, heparin resumed that day for pericardial window.   APTT therapeutic at 80 secs after heparin drip increased to 1450 units./hr . Chronic anemia and thrombocytopenia 2/2 lung cancer. No s/sx of bleeding.    Heparin to turn off at 0600 on 3/3 for pericardial window.   Goal of Therapy:  Heparin level 0.3-0.7 units/ml aPTT 66-102 seconds Monitor platelets by anticoagulation protocol: Yes   Plan:  continue heparin at 1450 units/hr Monitor s/sx of bleeding Stop  heparin 3/3 at 0600 - no am coag labs ordered for 3/3 am F/u for coag orders tomorrow after pericardial window and chest tube  Eudelia Bunch, Pharm.D. [phone] 09/25/2016 9:24 PM

## 2016-09-25 NOTE — Progress Notes (Signed)
[place] TEAM 1 - Stepdown/ICU TEAM  Carol Buchanan  MVE:[id number] DOB: Mar 13, 1961 DOA: 09/22/2016 PCP: Andria Frames, MD    Brief Narrative:  56 y.o. female with history of NSCC Lung CA dx'd in Nov 2016. Was treated with XRT initially and has been on chemoRx since then per Dr Marin Olp. 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 Presenting with one month hx worsening SOB. CT angio of chest in ED noted bilat segmental and subsegmental pulm emboli w/ no R heart strain.  Subjective: The patient is resting in bed.  She reports ongoing dyspnea but states it is not severe.  She also complains of constipation in The last bowel movement she had.  She denies chest pressure but does report diffuse crampy abdominal pain.  She is alert oriented and conversant.  Assessment & Plan:  Acute Pulmonary embolism Due to her pericardial effusion Xarelto was DC'd and heparin initiated  Pericardial effusion w/ tamponade  TTE noted physiologic tamponade - TCTS to perform window when Xarelto washed out  Acute kidney injury on chronic kidney disease stage III Baseline creatinine appears to be approximately 1.4 - follow trend - avoid diuresis, contrast, ACEi/ARB for now   Recent Labs Lab 09/22/16 1636 09/23/16 0201 09/24/16 0502 09/25/16 0327  CREATININE 1.16* 1.34* 1.42* 1.54*    Pancytopenia Due to active chemotherapy  Chronic diastolic heart failure Baseline weight appears to be approximately 83 kg - net negative approximately 1.6 L since admission Filed Weights   09/24/16 0607 09/24/16 1442 09/25/16 0500  Weight: 81.7 kg (180 lb 3.2 oz) 81.6 kg (180 lb) 83.3 kg (183 lb 10.3 oz)    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 Marin Olp  DM2 CBGs well controlled at this time  COPD on home O2 No active wheezing at this time   HTN Upper avoid overcorrection in the setting of tamponade    Hyperlipidemia  Hx of Pneumonitis from immunomodulator  DVT prophylaxis: IV heparin Code Status: FULL CODE Family Communication: no family present at time of exam  Disposition Plan: to OR for pericardial window 09/26/16  Consultants:  TCTS PCCM Cardiology  Procedures: 3/1 TTE - EF 55-60 percent with no regional wall motion abnormalities with large pericardial effusion and features suggestive of tamponade   2/28 bilateral lower extremity venous duplex without evidence of DVT  Antimicrobials:  none  Objective: Blood pressure 118/89, pulse 99, temperature 97.5 F (36.4 C), temperature source Oral, resp. rate 14, height '5\' 4"'$  (1.626 m), weight 83.3 kg (183 lb 10.3 oz), SpO2 98 %.  Intake/Output Summary (Last 24 hours) at 09/25/16 0836 Last data filed at 09/25/16 0600  Gross per 24 hour  Intake            512.6 ml  Output              150 ml  Net            362.6 ml   Filed Weights   09/24/16 0607 09/24/16 1442 09/25/16 0500  Weight: 81.7 kg (180 lb 3.2 oz) 81.6 kg (180 lb) 83.3 kg (183 lb 10.3 oz)    Examination: General: No acute respiratory distress at rest  Lungs: Clear to auscultation bilaterally without wheezes or crackles Cardiovascular: Regular rate and rhythm - rub appreciable - no murmur Abdomen: Nontender, nondistended, soft, bowel sounds positive, no rebound, no ascites, no appreciable mass Extremities: Trace bilateral  lower extremity edema  CBC:  Recent Labs Lab 09/22/16 1636 09/23/16 0201 09/24/16 0502 09/25/16 0327  WBC 1.6* 1.5* 2.5* 3.2*  NEUTROABS 0.8* 0.7*  --   --   HGB 8.0* 8.1* 8.1* 8.2*  HCT 23.8* 23.8* 24.4* 25.2*  MCV 97.9 100.0 99.6 103.3*  PLT 134* 127* 141* 824*   Basic Metabolic Panel:  Recent Labs Lab 09/22/16 1636 09/23/16 0201 09/24/16 0502 09/25/16 0327  NA 134* 134* 138 139  K 2.9* 3.0* 3.2* 3.9  CL 93* 94* 92* 94*  CO2 31 27 34* 31  GLUCOSE 89 111* 107* 98  BUN 20 19 21* 15  CREATININE 1.16* 1.34* 1.42* 1.54*   CALCIUM 8.9 8.8* 9.0 9.0   GFR: Estimated Creatinine Clearance: 42.6 mL/min (by C-G formula based on SCr of 1.54 mg/dL (H)).  Liver Function Tests:  Recent Labs Lab 09/22/16 1636 09/25/16 0327  AST 40 31  ALT 41 32  ALKPHOS 157* 142*  BILITOT 0.5 0.9  PROT 7.2 6.8  ALBUMIN 3.2* 2.7*    Coagulation Profile:  Recent Labs Lab 09/22/16 1636 09/25/16 0327  INR 1.15 1.30    HbA1C: Hgb A1c MFr Bld  Date/Time Value Ref Range Status  11/26/2015 02:54 AM 6.7 (H) 4.8 - 5.6 % Final    Comment:    (NOTE)         Pre-diabetes: 5.7 - 6.4         Diabetes: >6.4         Glycemic control for adults with diabetes: <7.0     CBG:  Recent Labs Lab 09/24/16 0611 09/24/16 2323  GLUCAP 103* 85    Recent Results (from the past 240 hour(s))  MRSA PCR Screening     Status: None   Collection Time: 09/24/16  2:53 PM  Result Value Ref Range Status   MRSA by PCR NEGATIVE NEGATIVE Final    Comment:        The GeneXpert MRSA Assay (FDA approved for NASAL specimens only), is one component of a comprehensive MRSA colonization surveillance program. It is not intended to diagnose MRSA infection nor to guide or monitor treatment for MRSA infections.   Surgical pcr screen     Status: None   Collection Time: 09/24/16  4:49 PM  Result Value Ref Range Status   MRSA, PCR NEGATIVE NEGATIVE Final   Staphylococcus aureus NEGATIVE NEGATIVE Final    Comment:        The Xpert SA Assay (FDA approved for NASAL specimens in patients over 64 years of age), is one component of a comprehensive surveillance program.  Test performance has been validated by [facility] for patients greater than or equal to 4 year old. It is not intended to diagnose infection nor to guide or monitor treatment.      Scheduled Meds: . DULoxetine  60 mg Oral Daily  . fluticasone  2 spray Each Nare Daily  . folic acid  1 mg Oral Daily  . ipratropium-albuterol  3 mL Nebulization TID  . montelukast  10 mg  Oral QHS  . pantoprazole  40 mg Oral Daily  . potassium chloride SA  20 mEq Oral Daily  . simvastatin  20 mg Oral q1800  . sodium chloride flush  3 mL Intravenous Q12H  . sodium chloride flush  3 mL Intravenous Q12H  . vitamin C  1,000 mg Oral Daily   Continuous Infusions: . heparin 1,350 Units/hr (09/25/16 0152)     LOS: 3 days  Cherene Altes, MD [facility] Office  [phone] Pager - Text Page per Amion as per below:  On-Call/Text Page:      [email or web address]      password [email or web address]  If 7PM-7AM, please contact night-coverage [email or web address] Password [email or web address] 09/25/2016, 8:36 AM

## 2016-09-25 NOTE — Progress Notes (Signed)
ANTICOAGULATION CONSULT NOTE - Follow Up Consult  Pharmacy Consult for Heparin  Indication: pulmonary embolus  No Known Allergies  Patient Measurements: Height: '5\' 4"'$  (162.6 cm) Weight: 180 lb (81.6 kg) IBW/kg (Calculated) : 54.7  Vital Signs: Temp: 97.8 F (36.6 C) (03/01 1442) Temp Source: Oral (03/01 1442) BP: 142/87 (03/01 2200) Pulse Rate: 107 (03/01 2200)  Labs:  Recent Labs  09/22/16 1636 09/23/16 0201 09/23/16 1016 09/24/16 0502 09/24/16 1626 09/25/16 0051  HGB 8.0* 8.1*  --  8.1*  --   --   HCT 23.8* 23.8*  --  24.4*  --   --   PLT 134* 127*  --  141*  --   --   APTT 48*  --   --  77* 61* 60*  LABPROT 14.8  --   --   --   --   --   INR 1.15  --   --   --   --   --   HEPARINUNFRC  --  <0.10* >2.20* >2.20*  --   --   CREATININE 1.16* 1.34*  --  1.42*  --   --     Estimated Creatinine Clearance: 45.7 mL/min (by C-G formula based on SCr of 1.42 mg/dL (H)).   Assessment: 78 y/oF with PMH of lung cancer (last dose of Alimta on 09/14/16), anemia (s/p blood transfusion last week), COPD, DM, HLD, HTN who presented to [facility] ED on 09/22/16 with worsening shortness of breath. CTa of chest + for PE. Pharmacy consulted to start heparin infusion on admission. Patient not on any anticoagulants PTA. Admission CBC revealed low Hgb at 8, low Pltc at 134K. PT/INR WNL, baseline aPTT elevated at 48 seconds. No active bleeding reported.   Patient transitioned to [place] 2/28 - received 2 doses yesterday 1300 & 1700  Xarelto discontinued 2/28 pm, planned resume Heparin with hx of anemia, GI bleed, possible need for invasive procedures  CBC: Hgb 8.1 stable, Pltc low, stable  No bleeding issues reported  CrCl ~ 45 ml/min  Heparin level supratherapeutic due to Xarelto falsely elevating level  3/2 AM: aPTT 60, no issues per RN, pt has large pericardial effusion, planning for pericardial window after Xarelto washout.   Goal of Therapy:  Heparin level 0.3-0.7 units/ml aPTT 66-102  seconds Monitor platelets by anticoagulation protocol: Yes   Plan:  -Inc heparin to 1350 units/hr -1000 aPTT/HL  Narda Bonds 09/25/2016,1:48 AM

## 2016-09-25 NOTE — Progress Notes (Signed)
Procedure(s) (LRB): SUBXYPHOID PERICARDIAL WINDOW (N/A) CHEST TUBE INSERTION (Left) TRANSESOPHAGEAL ECHOCARDIOGRAM (TEE) (N/A) Subjective: BP and respiratory status stable Plan pericardial window and L chest tube in am Patient will return to 4N room postop Objective: Vital signs in last 24 hours: Temp:  [97.5 F (36.4 C)-99.2 F (37.3 C)] 97.5 F (36.4 C) (03/02 0749) Pulse Rate:  [97-110] 103 (03/02 0900) Cardiac Rhythm: Normal sinus rhythm (03/02 0800) Resp:  [13-25] 15 (03/02 0900) BP: (113-158)/(67-114) 113/84 (03/02 0900) SpO2:  [94 %-99 %] 97 % (03/02 0900) Weight:  [180 lb (81.6 kg)-183 lb 10.3 oz (83.3 kg)] 183 lb 10.3 oz (83.3 kg) (03/02 0500)  Hemodynamic parameters for last 24 hours:  stable  Intake/Output from previous day: 03/01 0701 - 03/02 0700 In: 512.6 [P.O.:360; I.V.:152.6] Out: 150 [Urine:150] Intake/Output this shift: Total I/O In: 160.5 [P.O.:120; I.V.:40.5] Out: -        Exam    General- alert and comfortable   Lungs- decreased BS L basewithout rales, wheezes   Cor- regular rate and rhythm, no murmur , gallop   Abdomen- soft, non-tender   Extremities - warm, non-tender, minimal edema   Neuro- oriented, appropriate, no focal weakness    Lab Results:  Recent Labs  09/24/16 0502 09/25/16 0327  WBC 2.5* 3.2*  HGB 8.1* 8.2*  HCT 24.4* 25.2*  PLT 141* 147*   BMET:  Recent Labs  09/24/16 0502 09/25/16 0327  NA 138 139  K 3.2* 3.9  CL 92* 94*  CO2 34* 31  GLUCOSE 107* 98  BUN 21* 15  CREATININE 1.42* 1.54*  CALCIUM 9.0 9.0    PT/INR:  Recent Labs  09/25/16 0327  LABPROT 16.3*  INR 1.30   ABG    Component Value Date/Time   PHART 7.400 11/25/2015 2143   HCO3 16.5 (L) 11/25/2015 2143   TCO2 15.0 11/25/2015 2143   ACIDBASEDEF 6.8 (H) 11/25/2015 2143   O2SAT 90.8 11/25/2015 2143   CBG (last 3)   Recent Labs  09/24/16 0611 09/24/16 2323  GLUCAP 103* 85    Assessment/Plan: S/P Procedure(s) (LRB): SUBXYPHOID  PERICARDIAL WINDOW (N/A) CHEST TUBE INSERTION (Left) TRANSESOPHAGEAL ECHOCARDIOGRAM (TEE) (N/A) Surgery in am after xarelto washout   LOS: 3 days    Carol Buchanan 09/25/2016

## 2016-09-25 NOTE — Progress Notes (Signed)
Progress Note  Patient Name: Carol Buchanan Date of Encounter: 09/25/2016  Primary Cardiologist: Dr. Acie Fredrickson  Subjective   Feels SHOB.  Inpatient Medications    Scheduled Meds: . DULoxetine  60 mg Oral Daily  . fluticasone  2 spray Each Nare Daily  . folic acid  1 mg Oral Daily  . ipratropium-albuterol  3 mL Nebulization TID  . montelukast  10 mg Oral QHS  . pantoprazole  40 mg Oral Daily  . potassium chloride SA  20 mEq Oral Daily  . simvastatin  20 mg Oral q1800  . sodium chloride flush  3 mL Intravenous Q12H  . sodium chloride flush  3 mL Intravenous Q12H  . vitamin C  1,000 mg Oral Daily   Continuous Infusions: . heparin 1,350 Units/hr (09/25/16 0152)   PRN Meds: sodium chloride, feeding supplement (ENSURE ENLIVE), HYDROcodone-acetaminophen, polyethylene glycol, sodium chloride flush, sodium chloride flush   Vital Signs    Vitals:   09/25/16 0400 09/25/16 0500 09/25/16 0600 09/25/16 0749  BP: 130/78 137/82 118/89   Pulse: 99 97 99   Resp: '16 13 14   '$ Temp: 97.9 F (36.6 C)   97.5 F (36.4 C)  TempSrc: Oral   Oral  SpO2: 98% 97% 99%   Weight:  183 lb 10.3 oz (83.3 kg)    Height:        Intake/Output Summary (Last 24 hours) at 09/25/16 0813 Last data filed at 09/25/16 0600  Gross per 24 hour  Intake            512.6 ml  Output              150 ml  Net            362.6 ml   Filed Weights   09/24/16 0607 09/24/16 1442 09/25/16 0500  Weight: 180 lb 3.2 oz (81.7 kg) 180 lb (81.6 kg) 183 lb 10.3 oz (83.3 kg)    Telemetry    NSR - Personally Reviewed  ECG     sinus tach on 2/27- Personally Reviewed  Physical Exam   GEN: frail   Neck: unable to assess JVD Cardiac: RRR, 1/6 systolic murmur,; rubs, or gallops. No change in pulse amplitude with deep breathing Respiratory: Wheezing to auscultation bilaterally. GI: Soft, nontender, non-distended  MS: No edema; No deformity. Neuro:  Nonfocal  Psych: Normal affect   Labs    Chemistry Recent  Labs Lab 09/22/16 1636 09/23/16 0201 09/24/16 0502 09/25/16 0327  NA 134* 134* 138 139  K 2.9* 3.0* 3.2* 3.9  CL 93* 94* 92* 94*  CO2 31 27 34* 31  GLUCOSE 89 111* 107* 98  BUN 20 19 21* 15  CREATININE 1.16* 1.34* 1.42* 1.54*  CALCIUM 8.9 8.8* 9.0 9.0  PROT 7.2  --   --  6.8  ALBUMIN 3.2*  --   --  2.7*  AST 40  --   --  31  ALT 41  --   --  32  ALKPHOS 157*  --   --  142*  BILITOT 0.5  --   --  0.9  GFRNONAA 52* 43* 40* 37*  GFRAA 60* 50* 47* 42*  ANIONGAP '10 13 12 14     '$ Hematology Recent Labs Lab 09/23/16 0201 09/24/16 0502 09/25/16 0327  WBC 1.5* 2.5* 3.2*  RBC 2.38* 2.45* 2.44*  HGB 8.1* 8.1* 8.2*  HCT 23.8* 24.4* 25.2*  MCV 100.0 99.6 103.3*  MCH 34.0 33.1 33.6  MCHC 34.0  33.2 32.5  RDW 17.1* 16.9* 17.5*  PLT 127* 141* 147*    Cardiac EnzymesNo results for input(s): TROPONINI in the last 168 hours.  Recent Labs Lab 09/22/16 1646  TROPIPOC 0.00     BNP Recent Labs Lab 09/22/16 1636  BNP 72.8     DDimer No results for input(s): DDIMER in the last 168 hours.   Radiology    Dg Abd 1 View  Result Date: 09/23/2016 CLINICAL DATA:  Lower abdominal pain EXAM: ABDOMEN - 1 VIEW COMPARISON:  PET-CT 07/06/2016 FINDINGS: Degenerative disc disease L2-3 with L4-5 right-sided facet arthropathy and sclerosis. Bowel gas pattern is unremarkable. There is no free air, bowel obstruction, organomegaly nor suspicious calculi. IMPRESSION: Unremarkable bowel gas pattern. Degenerative disc and facet arthropathy of the lumbar spine. Electronically Signed   By: Ashley Royalty M.D.   On: 09/23/2016 20:06   Dg Chest Port 1 View  Result Date: 09/25/2016 CLINICAL DATA:  Pericarditis. EXAM: PORTABLE CHEST 1 VIEW COMPARISON:  CT 05/22/2017.  Chest x-ray 09/22/2016 . FINDINGS: PowerPort catheter noted with tip projected over right atrium in stable position. Stable cardiomegaly. Stable perihilar atelectasis/ infiltrates and left-sided pleural effusion. No pneumothorax. No acute bony  abnormality identified. Reference is made to CT report for discussion of possible bony lesions present. IMPRESSION: 1. PowerPort catheter stable position. 2. Stable perihilar atelectasis/ infiltrates and left-sided pleural effusion. No interim improvement. Electronically Signed   By: Marcello Moores  Register   On: 09/25/2016 07:32    Cardiac Studies   Echo with large pericardial effusion  Patient Profile     56 y.o. female with lung CA, pericardial effusion, pleural effusion, PE  Assessment & Plan    1) Plan for pericardial window tomorrow.  COntinue IV heparin until that time for PE.   2) Pleural effusion: Not sure if Dr. Prescott Gum will address this at the time of surgery as well.  3) BP stable.   4) Cr increasing slightly.  Cardiac output will improve after window which may help Cr.  Signed, Larae Grooms, MD  09/25/2016, 8:13 AM

## 2016-09-25 NOTE — Progress Notes (Signed)
ANTICOAGULATION CONSULT NOTE - Follow Up Consult  Pharmacy Consult for Heparin Indication: pulmonary embolus  Allergies  Allergen Reactions  . No Known Allergies     Patient Measurements: Height: '5\' 4"'$  (162.6 cm) Weight: 183 lb 10.3 oz (83.3 kg) IBW/kg (Calculated) : 54.7  Vital Signs: Temp: 97.5 F (36.4 C) (03/02 0749) Temp Source: Oral (03/02 0749) BP: 113/84 (03/02 0900) Pulse Rate: 103 (03/02 0900)  Labs:  Recent Labs  09/22/16 1636  09/23/16 0201 09/23/16 1016 09/24/16 0502 09/24/16 1626 09/25/16 0051 09/25/16 0327 09/25/16 1036  HGB 8.0*  --  8.1*  --  8.1*  --   --  8.2*  --   HCT 23.8*  --  23.8*  --  24.4*  --   --  25.2*  --   PLT 134*  --  127*  --  141*  --   --  147*  --   APTT 48*  --   --   --  77* 61* 60*  --  65*  LABPROT 14.8  --   --   --   --   --   --  16.3*  --   INR 1.15  --   --   --   --   --   --  1.30  --   HEPARINUNFRC  --   < > <0.10* >2.20* >2.20*  --   --   --  0.61  CREATININE 1.16*  --  1.34*  --  1.42*  --   --  1.54*  --   < > = values in this interval not displayed.  Estimated Creatinine Clearance: 42.6 mL/min (by C-G formula based on SCr of 1.54 mg/dL (H)).  Assessment: 89 y/oF with PMH of lung cancer (last dose of Alimta on 09/14/16), anemia (s/p blood transfusion last week), COPD, DM, HLD, HTN who presented to [facility] ED on 09/22/16 with worsening shortness of breath. CTa of chest + for PE. Pharmacy consulted to start heparin infusion on admission. Patient not on any anticoagulants PTA. Patient was transitioned to Xarelto on 2/28, heparin resumed that day for pericardial window.   AM aPTT slightly subtherapeutic, but heparin level therapeutic. Suspect still some xarelto in system (worsened renal function). Will recheck aPTT today. Chronic anemia and thrombocytopenia 2/2 lung cancer. No s/sx of bleeding.    Heparin to turn off at 0600 on 3/3 for pericardial window.   Goal of Therapy:  Heparin level 0.3-0.7 units/ml aPTT 66-102  seconds Monitor platelets by anticoagulation protocol: Yes   Plan:  Increase heparin to 1450 units/hr 2000 aPTT Monitor s/sx of bleeding Stop heparin 3/3 at 0600  Dierdre Harness, BS, PharmD Clinical Pharmacy Resident [phone] (Pager) 09/25/2016 1:12 PM

## 2016-09-26 ENCOUNTER — Inpatient Hospital Stay (HOSPITAL_COMMUNITY): Payer: Medicare Other | Admitting: Certified Registered Nurse Anesthetist

## 2016-09-26 ENCOUNTER — Inpatient Hospital Stay (HOSPITAL_COMMUNITY): Payer: Medicare Other

## 2016-09-26 ENCOUNTER — Encounter (HOSPITAL_COMMUNITY)
Admission: EM | Disposition: A | Discharge status: Home / Self Care | Payer: Self-pay | Source: Home / Self Care | Attending: Internal Medicine

## 2016-09-26 DIAGNOSIS — I10 Essential (primary) hypertension: Secondary | ICD-10-CM

## 2016-09-26 DIAGNOSIS — I313 Pericardial effusion (noninflammatory): Secondary | ICD-10-CM

## 2016-09-26 DIAGNOSIS — J9611 Chronic respiratory failure with hypoxia: Secondary | ICD-10-CM

## 2016-09-26 HISTORY — PX: SUBXYPHOID PERICARDIAL WINDOW: SHX5075

## 2016-09-26 HISTORY — PX: TEE WITHOUT CARDIOVERSION: SHX5443

## 2016-09-26 HISTORY — PX: CHEST TUBE INSERTION: SHX231

## 2016-09-26 LAB — CBC
HEMATOCRIT: 24.4 % — AB (ref 36.0–46.0)
HEMOGLOBIN: 7.8 g/dL — AB (ref 12.0–15.0)
MCH: 32.9 pg (ref 26.0–34.0)
MCHC: 32 g/dL (ref 30.0–36.0)
MCV: 103 fL — ABNORMAL HIGH (ref 78.0–100.0)
Platelets: 149 10*3/uL — ABNORMAL LOW (ref 150–400)
RBC: 2.37 MIL/uL — ABNORMAL LOW (ref 3.87–5.11)
RDW: 17.7 % — AB (ref 11.5–15.5)
WBC: 2.8 10*3/uL — ABNORMAL LOW (ref 4.0–10.5)

## 2016-09-26 LAB — COMPREHENSIVE METABOLIC PANEL
ALBUMIN: 2.6 g/dL — AB (ref 3.5–5.0)
ALK PHOS: 143 U/L — AB (ref 38–126)
ALT: 32 U/L (ref 14–54)
ANION GAP: 12 (ref 5–15)
AST: 34 U/L (ref 15–41)
BUN: 14 mg/dL (ref 6–20)
CALCIUM: 8.7 mg/dL — AB (ref 8.9–10.3)
CO2: 30 mmol/L (ref 22–32)
Chloride: 94 mmol/L — ABNORMAL LOW (ref 101–111)
Creatinine, Ser: 1.31 mg/dL — ABNORMAL HIGH (ref 0.44–1.00)
GFR calc Af Amer: 52 mL/min — ABNORMAL LOW (ref >60)
GFR calc non Af Amer: 45 mL/min — ABNORMAL LOW (ref >60)
GLUCOSE: 137 mg/dL — AB (ref 65–99)
POTASSIUM: 3.1 mmol/L — AB (ref 3.5–5.1)
SODIUM: 136 mmol/L (ref 135–145)
Total Bilirubin: 0.4 mg/dL (ref 0.3–1.2)
Total Protein: 6.3 g/dL — ABNORMAL LOW (ref 6.5–8.1)

## 2016-09-26 LAB — GRAM STAIN: Gram Stain: NONE SEEN

## 2016-09-26 SURGERY — CREATION, PERICARDIAL WINDOW, SUBXIPHOID APPROACH
Anesthesia: General

## 2016-09-26 MED ORDER — ROCURONIUM BROMIDE 50 MG/5ML IV SOSY
PREFILLED_SYRINGE | INTRAVENOUS | Status: AC
  Filled 2016-09-26: qty 5

## 2016-09-26 MED ORDER — ONDANSETRON HCL 4 MG/2ML IJ SOLN
INTRAMUSCULAR | Status: AC
  Filled 2016-09-26: qty 2

## 2016-09-26 MED ORDER — MIDAZOLAM HCL 2 MG/2ML IJ SOLN
INTRAMUSCULAR | Status: AC
  Filled 2016-09-26: qty 2

## 2016-09-26 MED ORDER — POTASSIUM CHLORIDE CRYS ER 20 MEQ PO TBCR
40.0000 meq | EXTENDED_RELEASE_TABLET | Freq: Once | ORAL | Status: AC
  Administered 2016-09-26: 40 meq via ORAL
  Filled 2016-09-26: qty 2

## 2016-09-26 MED ORDER — OXYCODONE HCL 5 MG/5ML PO SOLN: 5.0000 mg | Freq: Once | ORAL | Status: DC | PRN

## 2016-09-26 MED ORDER — PROPOFOL 10 MG/ML IV BOLUS
INTRAVENOUS | Status: DC | PRN
  Administered 2016-09-26 (×2): 50 mg via INTRAVENOUS

## 2016-09-26 MED ORDER — OXYCODONE HCL 5 MG PO TABS: 5.0000 mg | ORAL_TABLET | Freq: Once | ORAL | Status: DC | PRN

## 2016-09-26 MED ORDER — EPHEDRINE 5 MG/ML INJ
INTRAVENOUS | Status: AC
  Filled 2016-09-26: qty 10

## 2016-09-26 MED ORDER — FENTANYL CITRATE (PF) 100 MCG/2ML IJ SOLN
INTRAMUSCULAR | Status: DC | PRN
  Administered 2016-09-26: 150 ug via INTRAVENOUS
  Administered 2016-09-26: 25 ug via INTRAVENOUS

## 2016-09-26 MED ORDER — DEXTROSE 5 % IV SOLN
1.5000 g | Freq: Three times a day (TID) | INTRAVENOUS | Status: AC
  Administered 2016-09-26 – 2016-09-27 (×5): 1.5 g via INTRAVENOUS
  Filled 2016-09-26 (×6): qty 1.5

## 2016-09-26 MED ORDER — FENTANYL CITRATE (PF) 250 MCG/5ML IJ SOLN
INTRAMUSCULAR | Status: AC
  Filled 2016-09-26: qty 5

## 2016-09-26 MED ORDER — LIDOCAINE 2% (20 MG/ML) 5 ML SYRINGE
INTRAMUSCULAR | Status: AC
  Filled 2016-09-26: qty 5

## 2016-09-26 MED ORDER — HYDROMORPHONE HCL 1 MG/ML IJ SOLN: 0.2500 mg | INTRAMUSCULAR | Status: DC | PRN

## 2016-09-26 MED ORDER — HEPARIN (PORCINE) IN NACL 100-0.45 UNIT/ML-% IJ SOLN
1000.0000 [IU]/h | INTRAMUSCULAR | Status: DC
  Administered 2016-09-26: 1000 [IU]/h via INTRAVENOUS
  Filled 2016-09-26: qty 250

## 2016-09-26 MED ORDER — ROCURONIUM BROMIDE 100 MG/10ML IV SOLN
INTRAVENOUS | Status: DC | PRN
  Administered 2016-09-26: 20 mg via INTRAVENOUS
  Administered 2016-09-26: 30 mg via INTRAVENOUS

## 2016-09-26 MED ORDER — LABETALOL HCL 5 MG/ML IV SOLN
INTRAVENOUS | Status: DC | PRN
  Administered 2016-09-26 (×2): 5 mg via INTRAVENOUS

## 2016-09-26 MED ORDER — CEFUROXIME SODIUM 1.5 G IJ SOLR
1.5000 g | Freq: Three times a day (TID) | INTRAMUSCULAR | Status: DC
  Filled 2016-09-26: qty 1.5

## 2016-09-26 MED ORDER — ARTIFICIAL TEARS OP OINT
TOPICAL_OINTMENT | OPHTHALMIC | Status: DC | PRN
  Administered 2016-09-26: 1 via OPHTHALMIC

## 2016-09-26 MED ORDER — ALBUTEROL SULFATE HFA 108 (90 BASE) MCG/ACT IN AERS
INHALATION_SPRAY | RESPIRATORY_TRACT | Status: AC
  Filled 2016-09-26: qty 0

## 2016-09-26 MED ORDER — SUCCINYLCHOLINE CHLORIDE 200 MG/10ML IV SOSY
PREFILLED_SYRINGE | INTRAVENOUS | Status: AC
  Filled 2016-09-26: qty 10

## 2016-09-26 MED ORDER — SUGAMMADEX SODIUM 200 MG/2ML IV SOLN
INTRAVENOUS | Status: AC
  Filled 2016-09-26: qty 2

## 2016-09-26 MED ORDER — PHENYLEPHRINE HCL 10 MG/ML IJ SOLN
INTRAVENOUS | Status: DC | PRN
  Administered 2016-09-26: 20 ug/min via INTRAVENOUS

## 2016-09-26 MED ORDER — 0.9 % SODIUM CHLORIDE (POUR BTL) OPTIME
TOPICAL | Status: DC | PRN
  Administered 2016-09-26: 2000 mL

## 2016-09-26 MED ORDER — LIDOCAINE HCL (CARDIAC) 20 MG/ML IV SOLN
INTRAVENOUS | Status: DC | PRN
  Administered 2016-09-26: 60 mg via INTRAVENOUS

## 2016-09-26 MED ORDER — ONDANSETRON HCL 4 MG/2ML IJ SOLN: 4.0000 mg | Freq: Four times a day (QID) | INTRAMUSCULAR | Status: DC | PRN

## 2016-09-26 MED ORDER — SUCCINYLCHOLINE CHLORIDE 200 MG/10ML IV SOSY
PREFILLED_SYRINGE | INTRAVENOUS | Status: DC | PRN
  Administered 2016-09-26: 100 mg via INTRAVENOUS

## 2016-09-26 MED ORDER — SUGAMMADEX SODIUM 200 MG/2ML IV SOLN
INTRAVENOUS | Status: DC | PRN
  Administered 2016-09-26: 200 mg via INTRAVENOUS

## 2016-09-26 MED ORDER — LABETALOL HCL 5 MG/ML IV SOLN
INTRAVENOUS | Status: AC
  Filled 2016-09-26: qty 4

## 2016-09-26 MED ORDER — LACTATED RINGERS IV SOLN
INTRAVENOUS | Status: DC | PRN
  Administered 2016-09-26: 08:00:00 via INTRAVENOUS

## 2016-09-26 MED ORDER — PHENYLEPHRINE 40 MCG/ML (10ML) SYRINGE FOR IV PUSH (FOR BLOOD PRESSURE SUPPORT)
PREFILLED_SYRINGE | INTRAVENOUS | Status: AC
  Filled 2016-09-26: qty 10

## 2016-09-26 MED ORDER — LABETALOL HCL 5 MG/ML IV SOLN
INTRAVENOUS | Status: AC
  Filled 2016-09-26: qty 8

## 2016-09-26 MED ORDER — DEXTROSE 5 % IV SOLN
1.5000 g | Freq: Three times a day (TID) | INTRAVENOUS | Status: DC
  Filled 2016-09-26: qty 1.5

## 2016-09-26 MED ORDER — PROPOFOL 10 MG/ML IV BOLUS
INTRAVENOUS | Status: AC
  Filled 2016-09-26: qty 20

## 2016-09-26 MED ORDER — ETOMIDATE 2 MG/ML IV SOLN
INTRAVENOUS | Status: DC | PRN
  Administered 2016-09-26: 20 mg via INTRAVENOUS

## 2016-09-26 MED ORDER — ARTIFICIAL TEARS OP OINT
TOPICAL_OINTMENT | OPHTHALMIC | Status: AC
  Filled 2016-09-26: qty 3.5

## 2016-09-26 MED ORDER — ETOMIDATE 2 MG/ML IV SOLN
INTRAVENOUS | Status: AC
  Filled 2016-09-26: qty 10

## 2016-09-26 MED ORDER — SUCCINYLCHOLINE CHLORIDE 20 MG/ML IJ SOLN
INTRAMUSCULAR | Status: DC | PRN
  Administered 2016-09-26: 100 mg via INTRAVENOUS

## 2016-09-26 SURGICAL SUPPLY — 64 items
APL SKNCLS STERI-STRIP NONHPOA (GAUZE/BANDAGES/DRESSINGS) ×2
BENZOIN TINCTURE PRP APPL 2/3 (GAUZE/BANDAGES/DRESSINGS) ×4 IMPLANT
CANISTER SUCT 3000ML PPV (MISCELLANEOUS) ×6 IMPLANT
CATH THORACIC 28FR (CATHETERS) ×2 IMPLANT
CATH THORACIC 28FR RT ANG (CATHETERS) IMPLANT
CATH THORACIC 36FR (CATHETERS) IMPLANT
CATH THORACIC 36FR RT ANG (CATHETERS) IMPLANT
CLIP TI MEDIUM 24 (CLIP) ×2 IMPLANT
CLIP TI WIDE RED SMALL 24 (CLIP) ×2 IMPLANT
CLOSURE WOUND 1/2 X4 (GAUZE/BANDAGES/DRESSINGS) ×1
CONN ST 1/4X3/8  BEN (MISCELLANEOUS) ×2
CONN ST 1/4X3/8 BEN (MISCELLANEOUS) IMPLANT
CONT SPEC 4OZ CLIKSEAL STRL BL (MISCELLANEOUS) ×2 IMPLANT
COVER BACK TABLE 60X90IN (DRAPES) ×4 IMPLANT
COVER SURGICAL LIGHT HANDLE (MISCELLANEOUS) ×8 IMPLANT
DRAIN CHANNEL 28F RND 3/8 FF (WOUND CARE) ×4 IMPLANT
DRAPE CHEST BREAST 15X10 FENES (DRAPES) ×4 IMPLANT
DRAPE LAPAROSCOPIC ABDOMINAL (DRAPES) ×4 IMPLANT
DRAPE PROXIMA HALF (DRAPES) ×4 IMPLANT
DRSG COVADERM 4X6 (GAUZE/BANDAGES/DRESSINGS) ×2 IMPLANT
DRSG TEGADERM 4X4.75 (GAUZE/BANDAGES/DRESSINGS) ×2 IMPLANT
ELECT BLADE 4.0 EZ CLEAN MEGAD (MISCELLANEOUS) ×4
ELECT REM PT RETURN 9FT ADLT (ELECTROSURGICAL) ×8
ELECTRODE BLDE 4.0 EZ CLN MEGD (MISCELLANEOUS) IMPLANT
ELECTRODE REM PT RTRN 9FT ADLT (ELECTROSURGICAL) ×2 IMPLANT
GAUZE SPONGE 4X4 12PLY STRL (GAUZE/BANDAGES/DRESSINGS) ×6 IMPLANT
GAUZE SPONGE 4X4 16PLY XRAY LF (GAUZE/BANDAGES/DRESSINGS) ×4 IMPLANT
GLOVE BIO SURGEON STRL SZ7.5 (GLOVE) ×12 IMPLANT
GOWN STRL REUS W/ TWL LRG LVL3 (GOWN DISPOSABLE) ×4 IMPLANT
GOWN STRL REUS W/TWL LRG LVL3 (GOWN DISPOSABLE) ×16
HEMOSTAT POWDER SURGIFOAM 1G (HEMOSTASIS) IMPLANT
KIT BASIN OR (CUSTOM PROCEDURE TRAY) ×8 IMPLANT
KIT ROOM TURNOVER OR (KITS) ×6 IMPLANT
NS IRRIG 1000ML POUR BTL (IV SOLUTION) ×4 IMPLANT
PACK CHEST (CUSTOM PROCEDURE TRAY) ×4 IMPLANT
PAD ARMBOARD 7.5X6 YLW CONV (MISCELLANEOUS) ×16 IMPLANT
PAD ELECT DEFIB RADIOL ZOLL (MISCELLANEOUS) ×4 IMPLANT
STRIP CLOSURE SKIN 1/2X4 (GAUZE/BANDAGES/DRESSINGS) ×3 IMPLANT
SUT SILK  1 MH (SUTURE) ×4
SUT SILK 1 MH (SUTURE) IMPLANT
SUT SILK 2 0 SH CR/8 (SUTURE) ×4 IMPLANT
SUT VIC AB 1 CTX 18 (SUTURE) ×4 IMPLANT
SUT VIC AB 1 CTX 36 (SUTURE)
SUT VIC AB 1 CTX36XBRD ANBCTR (SUTURE) ×2 IMPLANT
SUT VIC AB 2-0 CTX 36 (SUTURE) ×2 IMPLANT
SUT VIC AB 3-0 X1 27 (SUTURE) ×4 IMPLANT
SWAB COLLECTION DEVICE MRSA (MISCELLANEOUS) IMPLANT
SYR 10ML LL (SYRINGE) IMPLANT
SYR 50ML SLIP (SYRINGE) IMPLANT
SYR CONTROL 10ML LL (SYRINGE) ×4 IMPLANT
SYSTEM SAHARA CHEST DRAIN ATS (WOUND CARE) IMPLANT
SYSTEM SAHARA CHEST DRAIN RE-I (WOUND CARE) ×6 IMPLANT
TAPE CLOTH SURG 4X10 WHT LF (GAUZE/BANDAGES/DRESSINGS) ×4 IMPLANT
TOWEL GREEN STERILE (TOWEL DISPOSABLE) ×8 IMPLANT
TOWEL OR 17X24 6PK STRL BLUE (TOWEL DISPOSABLE) ×4 IMPLANT
TOWEL OR 17X26 10 PK STRL BLUE (TOWEL DISPOSABLE) ×16 IMPLANT
TRAP SPECIMEN MUCOUS 40CC (MISCELLANEOUS) ×10 IMPLANT
TRAY CHEST TUBE INSERTION (SET/KITS/TRAYS/PACK) ×2 IMPLANT
TRAY FOLEY CATH 14FRSI W/METER (CATHETERS) ×2 IMPLANT
TRAY FOLEY IC TEMP SENS 16FR (CATHETERS) ×2 IMPLANT
TUBE ANAEROBIC SPECIMEN COL (MISCELLANEOUS) IMPLANT
TUBE CONNECTING 12'X1/4 (SUCTIONS) ×1
TUBE CONNECTING 12X1/4 (SUCTIONS) ×3 IMPLANT
WATER STERILE IRR 1000ML POUR (IV SOLUTION) ×8 IMPLANT

## 2016-09-26 NOTE — Progress Notes (Signed)
The patient was examined and preop studies reviewed. There has been no change from the prior exam and the patient is ready for surgery. Plan pericardial window and Left chest tube placement on [facility]

## 2016-09-26 NOTE — Progress Notes (Signed)
Echocardiogram Echocardiogram Transesophageal has been performed.  Carol Buchanan 09/26/2016, 9:44 AM

## 2016-09-26 NOTE — Anesthesia Procedure Notes (Signed)
Procedure Name: Intubation Date/Time: 09/26/2016 8:07 AM Performed by: Izora Gala Pre-anesthesia Checklist: Patient identified, Emergency Drugs available, Suction available, Patient being monitored and Timeout performed Patient Re-evaluated:Patient Re-evaluated prior to inductionOxygen Delivery Method: Circle system utilized Preoxygenation: Pre-oxygenation with 100% oxygen Intubation Type: IV induction Ventilation: Mask ventilation without difficulty Laryngoscope Size: Miller and 3 Grade View: Grade II Tube type: Oral Tube size: 7.5 mm Number of attempts: 1 Airway Equipment and Method: Stylet Placement Confirmation: ETT inserted through vocal cords under direct vision and positive ETCO2 Secured at: 22 cm Dental Injury: Teeth and Oropharynx as per pre-operative assessment

## 2016-09-26 NOTE — Progress Notes (Addendum)
Progress Note  Patient Name: Carol Buchanan Date of Encounter: 09/26/2016  Primary Cardiologist: Dr. Acie Fredrickson  Subjective   The patient just arrived from the OR, s/p pericardial window placement and chest tube placement. She states that SOB has improved.  Inpatient Medications    Scheduled Meds: . cefUROXime  1.5 g Intravenous Q8H  . DULoxetine  60 mg Oral Daily  . fluticasone  2 spray Each Nare Daily  . folic acid  1 mg Oral Daily  . ipratropium-albuterol  3 mL Nebulization TID  . montelukast  10 mg Oral QHS  . pantoprazole  40 mg Oral Daily  . polyethylene glycol  17 g Oral BID  . senna-docusate  1 tablet Oral BID  . simvastatin  20 mg Oral q1800  . vitamin C  1,000 mg Oral Daily   Continuous Infusions:  PRN Meds: feeding supplement (ENSURE ENLIVE), HYDROcodone-acetaminophen, LORazepam   Vital Signs    Vitals:   09/26/16 1015 09/26/16 1030 09/26/16 1045 09/26/16 1110  BP: (!) 127/93  117/90   Pulse: 99 (!) 103 (!) 106 (!) 110  Resp: '14 18 17 19  '$ Temp: 97.3 F (36.3 C)  97.7 F (36.5 C) 98 F (36.7 C)  TempSrc:    Oral  SpO2: 97% 95% 94% 94%  Weight:      Height:        Intake/Output Summary (Last 24 hours) at 09/26/16 1143 Last data filed at 09/26/16 1018  Gross per 24 hour  Intake          1190.23 ml  Output             2150 ml  Net          -959.77 ml   Filed Weights   09/24/16 1442 09/25/16 0500 09/26/16 0500  Weight: 180 lb (81.6 kg) 183 lb 10.3 oz (83.3 kg) 183 lb 3.2 oz (83.1 kg)    Telemetry    NSR - Personally Reviewed  ECG     sinus tach on 2/27- Personally Reviewed  Physical Exam   GEN: frail   Neck: unable to assess JVD Cardiac: RRR, 1/6 systolic murmur,; rubs, or gallops. No change in pulse amplitude with deep breathing Respiratory: Wheezing to auscultation bilaterally. GI: Soft, nontender, non-distended  MS: No edema; No deformity. Neuro:  Nonfocal  Psych: Normal affect   Labs    Chemistry  Recent Labs Lab  09/25/16 0327 09/25/16 1425 09/26/16 0219  NA 139 135 136  K 3.9 3.4* 3.1*  CL 94* 93* 94*  CO2 '31 27 30  '$ GLUCOSE 98 138* 137*  BUN '15 15 14  '$ CREATININE 1.54* 1.40* 1.31*  CALCIUM 9.0 8.9 8.7*  PROT 6.8 6.9 6.3*  ALBUMIN 2.7* 2.9* 2.6*  AST 31 36 34  ALT 32 34 32  ALKPHOS 142* 145* 143*  BILITOT 0.9 0.5 0.4  GFRNONAA 37* 41* 45*  GFRAA 42* 48* 52*  ANIONGAP '14 15 12     '$ Hematology  Recent Labs Lab 09/25/16 0327 09/25/16 1425 09/26/16 0219  WBC 3.2* 3.3* 2.8*  RBC 2.44* 2.56* 2.37*  HGB 8.2* 8.6* 7.8*  HCT 25.2* 26.4* 24.4*  MCV 103.3* 103.1* 103.0*  MCH 33.6 33.6 32.9  MCHC 32.5 32.6 32.0  RDW 17.5* 17.2* 17.7*  PLT 147* 170 149*    Cardiac EnzymesNo results for input(s): TROPONINI in the last 168 hours.   Recent Labs Lab 09/22/16 1646  TROPIPOC 0.00     BNP  Recent Labs Lab 09/22/16  1636  BNP 72.8     DDimer No results for input(s): DDIMER in the last 168 hours.   Radiology    Dg Chest Portable 1 View  Result Date: 09/26/2016 CLINICAL DATA:  Elective surgery.  Lung cancer. EXAM: PORTABLE CHEST 1 VIEW COMPARISON:  Chest radiograph from earlier today. FINDINGS: Endotracheal tube terminates at the level of the carina over the origin of the right mainstem bronchus. Right internal jugular MediPort terminates in the right atrium. Left apical chest tube and pericardial drain overlying the left lung base are in place. Top-normal heart size, with interval drainage of pericardial effusion. Otherwise stable mediastinal contour. Small peripheral basilar left hydropneumothorax. No right pneumothorax. No right pleural effusion. Patchy opacity throughout the parahilar and basilar left lung appears stable. IMPRESSION: 1. Small peripheral basilar left hydropneumothorax. Well-positioned left apical chest tube. 2. Interval drainage of pericardial effusion with top-normal heart size and pericardial drain in place. 3. Low lying endotracheal tube at the level of the carina  overlying the right mainstem bronchus origin, recommend retraction. 4. Stable patchy opacity throughout the parahilar and basilar left lung, probably a combination of tumor, atelectasis and/or posttreatment change. Critical Value/emergent results were called by telephone at the time of interpretation on 09/26/2016 at 10:04 am to Dr. Ivin Poot , who verbally acknowledged these results. Electronically Signed   By: Ilona Sorrel M.D.   On: 09/26/2016 10:07   Dg Chest Port 1 View  Result Date: 09/26/2016 CLINICAL DATA:  Shortness of breath. EXAM: PORTABLE CHEST 1 VIEW COMPARISON:  Radiograph of September 25, 2016. FINDINGS: Stable cardiomegaly. Right internal jugular Port-A-Cath is unchanged in position. No pneumothorax is noted. Right lung is clear. Stable left perihilar and basilar opacity is noted concerning for pneumonia or atelectasis with associated pleural effusion. Bony thorax is unremarkable. IMPRESSION: Stable left lung opacities as described above. Electronically Signed   By: Marijo Conception, M.D.   On: 09/26/2016 07:36   Dg Chest Port 1 View  Result Date: 09/25/2016 CLINICAL DATA:  Pericarditis. EXAM: PORTABLE CHEST 1 VIEW COMPARISON:  CT 05/22/2017.  Chest x-ray 09/22/2016 . FINDINGS: PowerPort catheter noted with tip projected over right atrium in stable position. Stable cardiomegaly. Stable perihilar atelectasis/ infiltrates and left-sided pleural effusion. No pneumothorax. No acute bony abnormality identified. Reference is made to CT report for discussion of possible bony lesions present. IMPRESSION: 1. PowerPort catheter stable position. 2. Stable perihilar atelectasis/ infiltrates and left-sided pleural effusion. No interim improvement. Electronically Signed   By: Marcello Moores  Register   On: 09/25/2016 07:32    Cardiac Studies   Personally reviewed periop TEE from today, at the end of the procedure only trivial residual effusion  Patient Profile     56 y.o. female with lung CA, pericardial  effusion, pleural effusion, PE  Assessment & Plan    1) s/p pericardial window and chest tube placement today, I have reviewed periop TEE from today, at the end of the procedure only trivial residual effusion, follow up TTE is scheduled for tomorrow, I will follow.  COntinue IV heparin until that time for PE.   2) Pleural effusion: s/p chest tube placement 3) BP stable.   4) Cr improving 1.5->1.4-> 1.3.  Signed, Ena Dawley, MD  09/26/2016, 11:43 AM

## 2016-09-26 NOTE — Anesthesia Preprocedure Evaluation (Signed)
Anesthesia Evaluation  Patient identified by MRN, date of birth, ID band Patient awake    Reviewed: Allergy & Precautions, NPO status , Patient's Chart, lab work & pertinent test results  Airway Mallampati: II   Neck ROM: full    Dental   Pulmonary shortness of breath, COPD, former smoker, PE Metastatic lung CA s/p XRT and chemo.  CT upon admission revealed bilateral pulmonary emboli.   breath sounds clear to auscultation       Cardiovascular hypertension,  Rhythm:regular Rate:Normal  Physiologic tamponade seen on TTE this admission.   Neuro/Psych    GI/Hepatic   Endo/Other  diabetes, Type 2  Renal/GU Renal InsufficiencyRenal disease     Musculoskeletal   Abdominal   Peds  Hematology  (+) Blood dyscrasia, anemia ,   Anesthesia Other Findings   Reproductive/Obstetrics                             Anesthesia Physical Anesthesia Plan  ASA: III  Anesthesia Plan: General   Post-op Pain Management:    Induction: Intravenous  Airway Management Planned: Oral ETT  Additional Equipment: TEE and Arterial line  Intra-op Plan:   Post-operative Plan:   Informed Consent: I have reviewed the patients History and Physical, chart, labs and discussed the procedure including the risks, benefits and alternatives for the proposed anesthesia with the patient or authorized representative who has indicated his/her understanding and acceptance.     Plan Discussed with: CRNA, Anesthesiologist and Surgeon  Anesthesia Plan Comments:         Anesthesia Quick Evaluation

## 2016-09-26 NOTE — Brief Op Note (Signed)
09/22/2016 - 09/26/2016  9:37 AM  PATIENT:  Carol Buchanan  56 y.o. female  PRE-OPERATIVE DIAGNOSIS:  PERICARDIAL EFFUSION PLEURAL EFFUSION  POST-OPERATIVE DIAGNOSIS:  PERICARDIAL EFFUSION PLEURAL EFFUSION  PROCEDURE:  Procedure(s): SUBXYPHOID PERICARDIAL WINDOW (N/A) CHEST TUBE INSERTION (Left) TRANSESOPHAGEAL ECHOCARDIOGRAM (TEE) (N/A)  SURGEON:  Surgeon(s) and Role:    * Ivin Poot, MD - Primary  PHYSICIAN ASSISTANT: Antwion Carpenter PA-C  ANESTHESIA:   general  EBL:  Total I/O In: 600 [I.V.:600] Out: 1110 [Urine:100; Other:1000; Blood:10]  BLOOD ADMINISTERED:none  DRAINS: 1 PERICARDIAL BARD DRAIN, 1 LEFT PLEURAL CHEST TUBE   LOCAL MEDICATIONS USED:  NONE  SPECIMEN:  Source of Specimen:  PLEURAL/PERICARDIAL FLUID/ PERICARDIUM  DISPOSITION OF SPECIMEN:  PATH/CYTOLOGY/MICRO  COUNTS:  YES  TOURNIQUET:  * No tourniquets in log *  DICTATION: .Other Dictation: Dictation Number PENDING  PLAN OF CARE: Admit to inpatient   PATIENT DISPOSITION:  PACU - hemodynamically stable.   Delay start of Pharmacological VTE agent (>24hrs) due to surgical blood loss or risk of bleeding: yes  COMPLICATIONS: NO KNOWN

## 2016-09-26 NOTE — Progress Notes (Signed)
[place] TEAM 1 - Stepdown/ICU TEAM  Carol Buchanan  QQP:[id number] DOB: 28-Dec-1960 DOA: 09/22/2016 PCP: Andria Frames, MD    Brief Narrative:  56 y.o. female with history of NSCC Lung CA dx'd in Nov 2016. Was treated with XRT initially and has been on chemoRx since then per Dr Marin Olp. Has had mets to rib and spine per ptt. Presenting with one month hx worsening SOB. CT angio of chest in ED noted bilat segmental and subsegmental pulm emboli w/ no R heart strain.  Subjective: The patient is seen in her ICU room postoperatively.  She is resting comfortably and appears to be in no acute respiratory distress.  Assessment & Plan:  Acute Pulmonary embolism Due to her pericardial effusion Xarelto was DC'd and heparin initiated  Pericardial effusion w/ tamponade  TTE noted physiologic tamponade - s/p pericardial window/drain today   Acute kidney injury on chronic kidney disease stage III Baseline creatinine appears to be approximately 1.4 - follow trend - avoid diuresis, contrast, ACEi/ARB for now   Recent Labs Lab 09/23/16 0201 09/24/16 0502 09/25/16 0327 09/25/16 1425 09/26/16 0219  CREATININE 1.34* 1.42* 1.54* 1.40* 1.31*    Pancytopenia Due to active chemotherapy  Chronic diastolic heart failure Baseline weight appears to be approximately 83 kg - net negative approximately 3.6 L since admission - no signif overload at this time  Filed Weights   09/24/16 1442 09/25/16 0500 09/26/16 0500  Weight: 81.6 kg (180 lb) 83.3 kg (183 lb 10.3 oz) 83.1 kg (183 lb 3.2 oz)    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 Marin Olp  DM2 CBGs well controlled at this time  COPD on home O2 No active wheezing at this time   HTN BP stable post-op   Hyperlipidemia  Hx of Pneumonitis from immunomodulator  DVT prophylaxis: IV heparin Code Status: FULL CODE Family Communication: no family  present at time of exam  Disposition Plan:   Consultants:  TCTS PCCM Cardiology  Procedures: 3/1 TTE - EF 55-60 percent with no regional wall motion abnormalities with large pericardial effusion and features suggestive of tamponade   2/28 bilateral lower extremity venous duplex without evidence of DVT  Antimicrobials:  none  Objective: Blood pressure 117/90, pulse (!) 110, temperature 98 F (36.7 C), temperature source Oral, resp. rate 19, height '5\' 4"'$  (1.626 m), weight 83.1 kg (183 lb 3.2 oz), SpO2 94 %.  Intake/Output Summary (Last 24 hours) at 09/26/16 1143 Last data filed at 09/26/16 1018  Gross per 24 hour  Intake          1190.23 ml  Output             2150 ml  Net          -959.77 ml   Filed Weights   09/24/16 1442 09/25/16 0500 09/26/16 0500  Weight: 81.6 kg (180 lb) 83.3 kg (183 lb 10.3 oz) 83.1 kg (183 lb 3.2 oz)    Examination: General: No acute respiratory distress evident Lungs: Clear to auscultation bilaterally Cardiovascular: Regular rate and rhythm  Abdomen: Nondistended, soft Extremities: Trace bilateral lower extremity edema  CBC:  Recent Labs Lab 09/22/16 1636 09/23/16 0201 09/24/16 0502 09/25/16 0327 09/25/16 1425 09/26/16 0219  WBC 1.6* 1.5* 2.5* 3.2* 3.3* 2.8*  NEUTROABS 0.8* 0.7*  --   --   --   --   HGB 8.0* 8.1* 8.1* 8.2* 8.6* 7.8*  HCT  23.8* 23.8* 24.4* 25.2* 26.4* 24.4*  MCV 97.9 100.0 99.6 103.3* 103.1* 103.0*  PLT 134* 127* 141* 147* 170 093*   Basic Metabolic Panel:  Recent Labs Lab 09/23/16 0201 09/24/16 0502 09/25/16 0327 09/25/16 1425 09/26/16 0219  NA 134* 138 139 135 136  K 3.0* 3.2* 3.9 3.4* 3.1*  CL 94* 92* 94* 93* 94*  CO2 27 34* '31 27 30  '$ GLUCOSE 111* 107* 98 138* 137*  BUN 19 21* '15 15 14  '$ CREATININE 1.34* 1.42* 1.54* 1.40* 1.31*  CALCIUM 8.8* 9.0 9.0 8.9 8.7*   GFR: Estimated Creatinine Clearance: 50 mL/min (by C-G formula based on SCr of 1.31 mg/dL (H)).  Liver Function Tests:  Recent Labs Lab  09/22/16 1636 09/25/16 0327 09/25/16 1425 09/26/16 0219  AST 40 31 36 34  ALT 41 32 34 32  ALKPHOS 157* 142* 145* 143*  BILITOT 0.5 0.9 0.5 0.4  PROT 7.2 6.8 6.9 6.3*  ALBUMIN 3.2* 2.7* 2.9* 2.6*    Coagulation Profile:  Recent Labs Lab 09/22/16 1636 09/25/16 0327 09/25/16 1425  INR 1.15 1.30 1.18    HbA1C: Hgb A1c MFr Bld  Date/Time Value Ref Range Status  11/26/2015 02:54 AM 6.7 (H) 4.8 - 5.6 % Final    Comment:    (NOTE)         Pre-diabetes: 5.7 - 6.4         Diabetes: >6.4         Glycemic control for adults with diabetes: <7.0     CBG:  Recent Labs Lab 09/24/16 0611 09/24/16 2323  GLUCAP 103* 85    Recent Results (from the past 240 hour(s))  MRSA PCR Screening     Status: None   Collection Time: 09/24/16  2:53 PM  Result Value Ref Range Status   MRSA by PCR NEGATIVE NEGATIVE Final    Comment:        The GeneXpert MRSA Assay (FDA approved for NASAL specimens only), is one component of a comprehensive MRSA colonization surveillance program. It is not intended to diagnose MRSA infection nor to guide or monitor treatment for MRSA infections.   Surgical pcr screen     Status: None   Collection Time: 09/24/16  4:49 PM  Result Value Ref Range Status   MRSA, PCR NEGATIVE NEGATIVE Final   Staphylococcus aureus NEGATIVE NEGATIVE Final    Comment:        The Xpert SA Assay (FDA approved for NASAL specimens in patients over 42 years of age), is one component of a comprehensive surveillance program.  Test performance has been validated by [facility] for patients greater than or equal to 6 year old. It is not intended to diagnose infection nor to guide or monitor treatment.      Scheduled Meds: . cefUROXime  1.5 g Intravenous Q8H  . DULoxetine  60 mg Oral Daily  . fluticasone  2 spray Each Nare Daily  . folic acid  1 mg Oral Daily  . ipratropium-albuterol  3 mL Nebulization TID  . montelukast  10 mg Oral QHS  . pantoprazole  40 mg Oral  Daily  . polyethylene glycol  17 g Oral BID  . senna-docusate  1 tablet Oral BID  . simvastatin  20 mg Oral q1800  . vitamin C  1,000 mg Oral Daily      LOS: 4 days   Cherene Altes, MD [facility] Office  [phone] Pager - Text Page per Amion as per below:  On-Call/Text  Page:      [email or web address]      password [email or web address]  If 7PM-7AM, please contact night-coverage [email or web address] Password [email or web address] 09/26/2016, 11:43 AM

## 2016-09-26 NOTE — Transfer of Care (Signed)
Immediate Anesthesia Transfer of Care Note  Patient: Carol Buchanan  Procedure(s) Performed: Procedure(s): SUBXYPHOID PERICARDIAL WINDOW (N/A) CHEST TUBE INSERTION (Left) TRANSESOPHAGEAL ECHOCARDIOGRAM (TEE) (N/A)  Patient Location: PACU  Anesthesia Type:General  Level of Consciousness: awake, alert  and patient cooperative  Airway & Oxygen Therapy: Patient Spontanous Breathing and Patient connected to face mask oxygen  Post-op Assessment: Report given to RN, Post -op Vital signs reviewed and stable, Patient moving all extremities and Patient moving all extremities X 4  Post vital signs: Reviewed and stable  Last Vitals:  Vitals:   09/26/16 0500 09/26/16 0600  BP: 121/85 (!) 153/80  Pulse: (!) 102 (!) 109  Resp: (!) 21 20  Temp:      Last Pain:  Vitals:   09/26/16 0400  TempSrc: Oral  PainSc: 0-No pain      Patients Stated Pain Goal: 2 (02/54/27 0623)  Complications: No apparent anesthesia complications

## 2016-09-26 NOTE — Progress Notes (Signed)
ANTICOAGULATION CONSULT NOTE - Follow Up Consult  Pharmacy Consult for Heparin Indication: pulmonary embolus  Allergies  Allergen Reactions  . No Known Allergies     Patient Measurements: Height: '5\' 4"'$  (162.6 cm) Weight: 183 lb 3.2 oz (83.1 kg) IBW/kg (Calculated) : 54.7  Vital Signs: Temp: 98 F (36.7 C) (03/03 1110) Temp Source: Oral (03/03 1110) BP: 117/90 (03/03 1045) Pulse Rate: 110 (03/03 1110)  Labs:  Recent Labs  09/24/16 0502  09/25/16 0051 09/25/16 0327 09/25/16 1036 09/25/16 1425 09/25/16 2001 09/26/16 0219  HGB 8.1*  --   --  8.2*  --  8.6*  --  7.8*  HCT 24.4*  --   --  25.2*  --  26.4*  --  24.4*  PLT 141*  --   --  147*  --  170  --  149*  APTT 77*  < > 60*  --  65*  --  80*  --   LABPROT  --   --   --  16.3*  --  15.1  --   --   INR  --   --   --  1.30  --  1.18  --   --   HEPARINUNFRC >2.20*  --   --   --  0.61  --   --   --   CREATININE 1.42*  --   --  1.54*  --  1.40*  --  1.31*  < > = values in this interval not displayed.  Estimated Creatinine Clearance: 50 mL/min (by C-G formula based on SCr of 1.31 mg/dL (H)).  Assessment: 72 y/oF with PMH of lung cancer (last dose of Alimta on 09/14/16), anemia (s/p blood transfusion last week), COPD, DM, HLD, HTN who presented to [facility] ED on 09/22/16 with worsening shortness of breath. CTa of chest + for PE. Pharmacy consulted to start heparin infusion on admission. Patient not on any anticoagulants PTA. Patient was transitioned to Xarelto on 2/28, heparin resumed that day for pericardial window.   Heparin off this morning for pericardial window.  Discussed with Dr. Prescott Gum, he would like heparin resumed at 4 pm today with target heparin level 0.3.  However, he would like the maximum heparin rate to be 1000 units/hr.  I do not suspect heparin level will be detectable on this rate given previously required heparin 1450 units/hr to obtain therapeutic levels.  Recent Xarelto dosing, but heparin levels were  decreasing yesterday, should now be fully washed out.  Goal of Therapy:  Heparin level 0.3 Monitor platelets by anticoagulation protocol: Yes   Plan:  Resume heparin at 1000 units/hr at 4 PM. Check heparin level in 6 hrs to ensure not > 0.3. Daily heparin level and CBC. Will f/u with Dr. Prescott Gum 3/4 to discuss when heparin may be adjusted per pharmacy, and when to resume Xarelto.  Uvaldo Rising, BCPS  Clinical Pharmacist Pager [phone]  09/26/2016 2:51 PM

## 2016-09-27 ENCOUNTER — Inpatient Hospital Stay (HOSPITAL_COMMUNITY): Payer: Medicare Other

## 2016-09-27 DIAGNOSIS — J431 Panlobular emphysema: Secondary | ICD-10-CM

## 2016-09-27 LAB — CBC
HCT: 27.8 % — ABNORMAL LOW (ref 36.0–46.0)
Hemoglobin: 9.1 g/dL — ABNORMAL LOW (ref 12.0–15.0)
MCH: 33.6 pg (ref 26.0–34.0)
MCHC: 32.7 g/dL (ref 30.0–36.0)
MCV: 102.6 fL — ABNORMAL HIGH (ref 78.0–100.0)
Platelets: 195 10*3/uL (ref 150–400)
RBC: 2.71 MIL/uL — ABNORMAL LOW (ref 3.87–5.11)
RDW: 17.7 % — ABNORMAL HIGH (ref 11.5–15.5)
WBC: 6.1 10*3/uL (ref 4.0–10.5)

## 2016-09-27 LAB — BASIC METABOLIC PANEL
Anion gap: 13 (ref 5–15)
BUN: 10 mg/dL (ref 6–20)
CO2: 30 mmol/L (ref 22–32)
Calcium: 8.4 mg/dL — ABNORMAL LOW (ref 8.9–10.3)
Chloride: 94 mmol/L — ABNORMAL LOW (ref 101–111)
Creatinine, Ser: 1.36 mg/dL — ABNORMAL HIGH (ref 0.44–1.00)
GFR calc Af Amer: 49 mL/min — ABNORMAL LOW (ref >60)
GFR calc non Af Amer: 43 mL/min — ABNORMAL LOW (ref >60)
Glucose, Bld: 110 mg/dL — ABNORMAL HIGH (ref 65–99)
Potassium: 3.4 mmol/L — ABNORMAL LOW (ref 3.5–5.1)
Sodium: 137 mmol/L (ref 135–145)

## 2016-09-27 LAB — ACID FAST SMEAR (AFB, MYCOBACTERIA)
Acid Fast Smear: NEGATIVE
Acid Fast Smear: NEGATIVE
Acid Fast Smear: NEGATIVE

## 2016-09-27 LAB — HEPARIN LEVEL (UNFRACTIONATED)
HEPARIN UNFRACTIONATED: 0.14 [IU]/mL — AB (ref 0.30–0.70)
HEPARIN UNFRACTIONATED: 0.15 [IU]/mL — AB (ref 0.30–0.70)

## 2016-09-27 LAB — GLUCOSE, CAPILLARY
GLUCOSE-CAPILLARY: 117 mg/dL — AB (ref 65–99)
Glucose-Capillary: 134 mg/dL — ABNORMAL HIGH (ref 65–99)

## 2016-09-27 MED ORDER — INSULIN ASPART 100 UNIT/ML ~~LOC~~ SOLN: 0.0000 [IU] | Freq: Every day | SUBCUTANEOUS | Status: DC

## 2016-09-27 MED ORDER — LEVALBUTEROL HCL 0.63 MG/3ML IN NEBU: 0.6300 mg | INHALATION_SOLUTION | Freq: Four times a day (QID) | RESPIRATORY_TRACT | Status: DC | PRN

## 2016-09-27 MED ORDER — LEVALBUTEROL HCL 1.25 MG/0.5ML IN NEBU
1.2500 mg | INHALATION_SOLUTION | Freq: Three times a day (TID) | RESPIRATORY_TRACT | Status: DC
  Administered 2016-09-27 – 2016-10-02 (×14): 1.25 mg via RESPIRATORY_TRACT
  Filled 2016-09-27 (×14): qty 0.5

## 2016-09-27 MED ORDER — ATORVASTATIN CALCIUM 10 MG PO TABS
10.0000 mg | ORAL_TABLET | Freq: Every day | ORAL | Status: DC
  Administered 2016-09-27 – 2016-10-01 (×5): 10 mg via ORAL
  Filled 2016-09-27 (×5): qty 1

## 2016-09-27 MED ORDER — INSULIN ASPART 100 UNIT/ML ~~LOC~~ SOLN
0.0000 [IU] | Freq: Three times a day (TID) | SUBCUTANEOUS | Status: DC
  Administered 2016-09-27 – 2016-09-30 (×6): 1 [IU] via SUBCUTANEOUS

## 2016-09-27 MED ORDER — LEVALBUTEROL HCL 1.25 MG/0.5ML IN NEBU
1.2500 mg | INHALATION_SOLUTION | Freq: Four times a day (QID) | RESPIRATORY_TRACT | Status: DC
  Administered 2016-09-27: 1.25 mg via RESPIRATORY_TRACT
  Filled 2016-09-27: qty 0.5

## 2016-09-27 MED ORDER — DILTIAZEM HCL ER COATED BEADS 120 MG PO CP24
120.0000 mg | ORAL_CAPSULE | Freq: Every day | ORAL | Status: DC
  Administered 2016-09-27 – 2016-10-02 (×6): 120 mg via ORAL
  Filled 2016-09-27 (×6): qty 1

## 2016-09-27 MED ORDER — HEPARIN (PORCINE) IN NACL 100-0.45 UNIT/ML-% IJ SOLN
1750.0000 [IU]/h | INTRAMUSCULAR | Status: DC
  Administered 2016-09-27: 1100 [IU]/h via INTRAVENOUS
  Administered 2016-09-28: 1450 [IU]/h via INTRAVENOUS
  Administered 2016-09-29: 1650 [IU]/h via INTRAVENOUS
  Administered 2016-09-29: 1600 [IU]/h via INTRAVENOUS
  Administered 2016-09-30: 1650 [IU]/h via INTRAVENOUS
  Administered 2016-10-01: 1750 [IU]/h via INTRAVENOUS
  Filled 2016-09-27 (×6): qty 250

## 2016-09-27 NOTE — Progress Notes (Addendum)
Progress Note  Patient Name: Carol Buchanan Date of Encounter: 09/27/2016  Primary Cardiologist: Dr. Acie Fredrickson  Subjective   The patient states that she feel much better, no more chest pain, SOB has improved.   Inpatient Medications    Scheduled Meds: . cefUROXime (ZINACEF)  IV  1.5 g Intravenous Q8H  . DULoxetine  60 mg Oral Daily  . fluticasone  2 spray Each Nare Daily  . folic acid  1 mg Oral Daily  . ipratropium-albuterol  3 mL Nebulization TID  . montelukast  10 mg Oral QHS  . pantoprazole  40 mg Oral Daily  . polyethylene glycol  17 g Oral BID  . senna-docusate  1 tablet Oral BID  . simvastatin  20 mg Oral q1800  . vitamin C  1,000 mg Oral Daily   Continuous Infusions: . heparin 1,000 Units/hr (09/26/16 1607)   PRN Meds: feeding supplement (ENSURE ENLIVE), HYDROcodone-acetaminophen, LORazepam   Vital Signs    Vitals:   09/27/16 0600 09/27/16 0700 09/27/16 0810 09/27/16 0847  BP: 131/67 117/72    Pulse: (!) 106 (!) 104    Resp: 13 13    Temp:   97.8 F (36.6 C)   TempSrc:   Oral   SpO2: 96% 98%  97%  Weight:      Height:        Intake/Output Summary (Last 24 hours) at 09/27/16 0918 Last data filed at 09/27/16 4742  Gross per 24 hour  Intake              823 ml  Output             3800 ml  Net            -2977 ml   Filed Weights   09/25/16 0500 09/26/16 0500 09/27/16 0500  Weight: 183 lb 10.3 oz (83.3 kg) 183 lb 3.2 oz (83.1 kg) 186 lb 15.2 oz (84.8 kg)    Telemetry    NSR - Personally Reviewed  ECG     sinus tach on 3/3 and 3/4 - Personally Reviewed  Physical Exam   GEN: frail   Neck: no JVD Cardiac: RRR, 1/6 systolic murmur,+ rubs, or gallops. No change in pulse amplitude with deep breathing Respiratory: Wheezing to auscultation bilaterally. GI: Soft, nontender, non-distended  MS: No edema; No deformity. Neuro:  Nonfocal  Psych: Normal affect   Labs    Chemistry  Recent Labs Lab 09/25/16 0327 09/25/16 1425 09/26/16 0219  09/27/16 0018  NA 139 135 136 137  K 3.9 3.4* 3.1* 3.4*  CL 94* 93* 94* 94*  CO2 '31 27 30 30  '$ GLUCOSE 98 138* 137* 110*  BUN '15 15 14 10  '$ CREATININE 1.54* 1.40* 1.31* 1.36*  CALCIUM 9.0 8.9 8.7* 8.4*  PROT 6.8 6.9 6.3*  --   ALBUMIN 2.7* 2.9* 2.6*  --   AST 31 36 34  --   ALT 32 34 32  --   ALKPHOS 142* 145* 143*  --   BILITOT 0.9 0.5 0.4  --   GFRNONAA 37* 41* 45* 43*  GFRAA 42* 48* 52* 49*  ANIONGAP '14 15 12 13     '$ Hematology  Recent Labs Lab 09/25/16 1425 09/26/16 0219 09/27/16 0018  WBC 3.3* 2.8* 6.1  RBC 2.56* 2.37* 2.71*  HGB 8.6* 7.8* 9.1*  HCT 26.4* 24.4* 27.8*  MCV 103.1* 103.0* 102.6*  MCH 33.6 32.9 33.6  MCHC 32.6 32.0 32.7  RDW 17.2* 17.7* 17.7*  PLT  170 149* 195   Cardiac EnzymesNo results for input(s): TROPONINI in the last 168 hours.   Recent Labs Lab 09/22/16 1646  TROPIPOC 0.00    BNP  Recent Labs Lab 09/22/16 1636  BNP 72.8    DDimer No results for input(s): DDIMER in the last 168 hours.   Radiology    Dg Chest Port 1 View  Result Date: 09/27/2016 CLINICAL DATA:  Chest tube in place. EXAM: PORTABLE CHEST 1 VIEW COMPARISON:  Radiograph of September 26, 2016. FINDINGS: Stable cardiomediastinal silhouette. Endotracheal tube has been removed. Left-sided chest tube is unchanged in position. No definite pneumothorax is noted. Left perihilar and basilar opacity is noted concerning for pneumonia, tumor or atelectasis with associated pleural effusion. Right internal jugular Port-A-Cath is unchanged. Mild right basilar subsegmental atelectasis is noted. Bony thorax is unremarkable. IMPRESSION: Stable position of left-sided chest tube without definite pneumothorax. Left perihilar and basilar opacity is noted concerning for atelectasis, pneumonia or tumor with associated pleural effusion. Electronically Signed   By: Marijo Conception, M.D.   On: 09/27/2016 07:19   Dg Chest Portable 1 View  Result Date: 09/26/2016 CLINICAL DATA:  Elective surgery.  Lung  cancer. EXAM: PORTABLE CHEST 1 VIEW COMPARISON:  Chest radiograph from earlier today. FINDINGS: Endotracheal tube terminates at the level of the carina over the origin of the right mainstem bronchus. Right internal jugular MediPort terminates in the right atrium. Left apical chest tube and pericardial drain overlying the left lung base are in place. Top-normal heart size, with interval drainage of pericardial effusion. Otherwise stable mediastinal contour. Small peripheral basilar left hydropneumothorax. No right pneumothorax. No right pleural effusion. Patchy opacity throughout the parahilar and basilar left lung appears stable. IMPRESSION: 1. Small peripheral basilar left hydropneumothorax. Well-positioned left apical chest tube. 2. Interval drainage of pericardial effusion with top-normal heart size and pericardial drain in place. 3. Low lying endotracheal tube at the level of the carina overlying the right mainstem bronchus origin, recommend retraction. 4. Stable patchy opacity throughout the parahilar and basilar left lung, probably a combination of tumor, atelectasis and/or posttreatment change. Critical Value/emergent results were called by telephone at the time of interpretation on 09/26/2016 at 10:04 am to Dr. Ivin Poot , who verbally acknowledged these results. Electronically Signed   By: Ilona Sorrel M.D.   On: 09/26/2016 10:07   Dg Chest Port 1 View  Result Date: 09/26/2016 CLINICAL DATA:  Shortness of breath. EXAM: PORTABLE CHEST 1 VIEW COMPARISON:  Radiograph of September 25, 2016. FINDINGS: Stable cardiomegaly. Right internal jugular Port-A-Cath is unchanged in position. No pneumothorax is noted. Right lung is clear. Stable left perihilar and basilar opacity is noted concerning for pneumonia or atelectasis with associated pleural effusion. Bony thorax is unremarkable. IMPRESSION: Stable left lung opacities as described above. Electronically Signed   By: Marijo Conception, M.D.   On: 09/26/2016 07:36     Cardiac Studies   Personally reviewed periop TEE from today, at the end of the procedure only trivial residual effusion   Patient Profile     56 y.o. female with lung CA, pericardial effusion, pleural effusion, PE  Assessment & Plan    1) s/p pericardial window and chest tube placement yesterday, I have reviewed periop TEE from OR, at the end of the procedure only trivial residual effusion, follow up TTE is scheduled for today, I will follow. The patient feels better, however continues wheezing, I will start Xopenex resp treatments. She is in persistent Sinus tachycardia, multifactorial -  pulmonary embolism, lung ca, hypoxia, she is encouraged to drink fluids, no betablockers as she is wheezing.  Continue IV heparin until that time for PE.   2) Pleural effusion: s/p chest tube placement 3) BP - start cardizem CD 120 mg po daily.   4) Cr improving 1.5->1.4-> 1.3. 5) K improved, we will continue replacing  Signed, Ena Dawley, MD  09/27/2016, 9:18 AM

## 2016-09-27 NOTE — Progress Notes (Signed)
[place] TEAM 1 - Stepdown/ICU TEAM  LAKEDRA WASHINGTON  GBT:[id number] DOB: 1960/09/26 DOA: 09/22/2016 PCP: Andria Frames, MD    Brief Narrative:  56 y.o. female with history of [place] Lung CA dx'd in Nov 2016. Was treated with XRT initially and has been on chemoRx since then per Dr Marin Olp. Has had mets to rib and spine. Presented with one month hx worsening SOB. CT angio of chest in ED noted bilat segmental and subsegmental pulm emboli w/ no R heart strain.  Subjective: The patient is sitting up in a bedside chair.  She reports she feels great.  She denies shortness of breath fevers chills nausea or vomiting.  She is in good spirits.  Assessment & Plan:  Acute Pulmonary embolism Cont IV heparin for now - transition to oral tx when cleared by TCTS   Pericardial effusion w/ tamponade - Pleural effusion  TTE noted physiologic tamponade - s/p pericardial window/drain and chest tube - tube management per TCTS  Acute kidney injury on chronic kidney disease stage III Baseline creatinine appears to be approximately 1.4 - follow trend - avoid diuresis, contrast, ACEi/ARB for now - renal fxn stable at baseline presently   Recent Labs Lab 09/24/16 0502 09/25/16 0327 09/25/16 1425 09/26/16 0219 09/27/16 0018  CREATININE 1.42* 1.54* 1.40* 1.31* 1.36*    Pancytopenia Due to active chemotherapy  Chronic diastolic heart failure Baseline weight appears to be approximately 83 kg - net negative ~4.4 L since admission - no signif overload at this time  Filed Weights   09/25/16 0500 09/26/16 0500 09/27/16 0500  Weight: 83.3 kg (183 lb 10.3 oz) 83.1 kg (183 lb 3.2 oz) 84.8 kg (186 lb 15.2 oz)    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 Marin Olp  DM2 Follow CBG QAC/HS w/ SSI   COPD on home O2 No active wheezing at this time   HTN BP stable post-op   Hypokalemia Replace and follow    Hyperlipidemia  Hx of Pneumonitis from immunomodulator  DVT prophylaxis: IV heparin Code Status: FULL CODE Family Communication: no family present at time of exam  Disposition Plan:   Consultants:  TCTS PCCM Cardiology  Procedures: 3/1 TTE - EF 55-60 percent with no regional wall motion abnormalities with large pericardial effusion and features suggestive of tamponade   2/28 bilateral lower extremity venous duplex without evidence of DVT  Antimicrobials:  none  Objective: Blood pressure 122/79, pulse (!) 110, temperature 97.9 F (36.6 C), temperature source Oral, resp. rate (!) 21, height '5\' 4"'$  (1.626 m), weight 84.8 kg (186 lb 15.2 oz), SpO2 96 %.  Intake/Output Summary (Last 24 hours) at 09/27/16 1209 Last data filed at 09/27/16 7106  Gross per 24 hour  Intake              773 ml  Output             2680 ml  Net            -1907 ml   Filed Weights   09/25/16 0500 09/26/16 0500 09/27/16 0500  Weight: 83.3 kg (183 lb 10.3 oz) 83.1 kg (183 lb 3.2 oz) 84.8 kg (186 lb 15.2 oz)    Examination: General: No acute respiratory distress - alert and conversant  Lungs: Clear to auscultation bilaterally - no wheezing at time of my exam  Cardiovascular: Regular rate and rhythm - no M  Abdomen: Nondistended,  soft, bs+  Extremities: Trace bilateral lower extremity edema  CBC:  Recent Labs Lab 09/22/16 1636 09/23/16 0201 09/24/16 0502 09/25/16 0327 09/25/16 1425 09/26/16 0219 09/27/16 0018  WBC 1.6* 1.5* 2.5* 3.2* 3.3* 2.8* 6.1  NEUTROABS 0.8* 0.7*  --   --   --   --   --   HGB 8.0* 8.1* 8.1* 8.2* 8.6* 7.8* 9.1*  HCT 23.8* 23.8* 24.4* 25.2* 26.4* 24.4* 27.8*  MCV 97.9 100.0 99.6 103.3* 103.1* 103.0* 102.6*  PLT 134* 127* 141* 147* 170 149* 562   Basic Metabolic Panel:  Recent Labs Lab 09/24/16 0502 09/25/16 0327 09/25/16 1425 09/26/16 0219 09/27/16 0018  NA 138 139 135 136 137  K 3.2* 3.9 3.4* 3.1* 3.4*  CL 92* 94* 93* 94* 94*  CO2 34* '31 27 30 30   '$ GLUCOSE 107* 98 138* 137* 110*  BUN 21* '15 15 14 10  '$ CREATININE 1.42* 1.54* 1.40* 1.31* 1.36*  CALCIUM 9.0 9.0 8.9 8.7* 8.4*   GFR: Estimated Creatinine Clearance: 48.6 mL/min (by C-G formula based on SCr of 1.36 mg/dL (H)).  Liver Function Tests:  Recent Labs Lab 09/22/16 1636 09/25/16 0327 09/25/16 1425 09/26/16 0219  AST 40 31 36 34  ALT 41 32 34 32  ALKPHOS 157* 142* 145* 143*  BILITOT 0.5 0.9 0.5 0.4  PROT 7.2 6.8 6.9 6.3*  ALBUMIN 3.2* 2.7* 2.9* 2.6*    Coagulation Profile:  Recent Labs Lab 09/22/16 1636 09/25/16 0327 09/25/16 1425  INR 1.15 1.30 1.18    HbA1C: Hgb A1c MFr Bld  Date/Time Value Ref Range Status  11/26/2015 02:54 AM 6.7 (H) 4.8 - 5.6 % Final    Comment:    (NOTE)         Pre-diabetes: 5.7 - 6.4         Diabetes: >6.4         Glycemic control for adults with diabetes: <7.0     CBG:  Recent Labs Lab 09/24/16 0611 09/24/16 2323  GLUCAP 103* 85    Recent Results (from the past 240 hour(s))  MRSA PCR Screening     Status: None   Collection Time: 09/24/16  2:53 PM  Result Value Ref Range Status   MRSA by PCR NEGATIVE NEGATIVE Final    Comment:        The GeneXpert MRSA Assay (FDA approved for NASAL specimens only), is one component of a comprehensive MRSA colonization surveillance program. It is not intended to diagnose MRSA infection nor to guide or monitor treatment for MRSA infections.   Surgical pcr screen     Status: None   Collection Time: 09/24/16  4:49 PM  Result Value Ref Range Status   MRSA, PCR NEGATIVE NEGATIVE Final   Staphylococcus aureus NEGATIVE NEGATIVE Final    Comment:        The Xpert SA Assay (FDA approved for NASAL specimens in patients over 72 years of age), is one component of a comprehensive surveillance program.  Test performance has been validated by [facility] for patients greater than or equal to 88 year old. It is not intended to diagnose infection nor to guide or monitor  treatment.   Gram stain     Status: None   Collection Time: 09/26/16  9:01 AM  Result Value Ref Range Status   Specimen Description FLUID PERICARDIAL  Final   Special Requests PATIENT ON FOLLOWING  ZINACEF  Final   Gram Stain   Final    RARE WBC PRESENT,BOTH PMN  AND MONONUCLEAR NO ORGANISMS SEEN    Report Status 09/26/2016 FINAL  Final  Aerobic/Anaerobic Culture (surgical/deep wound)     Status: None (Preliminary result)   Collection Time: 09/26/16  9:12 AM  Result Value Ref Range Status   Specimen Description TISSUE  Final   Special Requests   Final    PERICARDIUM SPECIMEN C PATIENT ON FOLLOWING ZINACEF   Gram Stain NO WBC SEEN NO ORGANISMS SEEN   Final   Culture PENDING  Incomplete   Report Status PENDING  Incomplete  Gram stain     Status: None   Collection Time: 09/26/16  9:26 AM  Result Value Ref Range Status   Specimen Description FLUID PLEURAL  Final   Special Requests PATIENT ON FOLLOWING  ZINACEF  Final   Gram Stain NO WBC SEEN NO ORGANISMS SEEN   Final   Report Status 09/26/2016 FINAL  Final     Scheduled Meds: . atorvastatin  10 mg Oral q1800  . cefUROXime (ZINACEF)  IV  1.5 g Intravenous Q8H  . diltiazem  120 mg Oral Daily  . DULoxetine  60 mg Oral Daily  . fluticasone  2 spray Each Nare Daily  . folic acid  1 mg Oral Daily  . ipratropium-albuterol  3 mL Nebulization TID  . levalbuterol  1.25 mg Nebulization Q6H  . montelukast  10 mg Oral QHS  . pantoprazole  40 mg Oral Daily  . polyethylene glycol  17 g Oral BID  . senna-docusate  1 tablet Oral BID  . vitamin C  1,000 mg Oral Daily   . heparin 1,000 Units/hr (09/26/16 1607)     LOS: 5 days   Cherene Altes, MD [facility] Office  [phone] Pager - Text Page per Amion as per below:  On-Call/Text Page:      [email or web address]      password [email or web address]  If 7PM-7AM, please contact night-coverage [email or web address] Password [email or web address] 09/27/2016, 12:09 PM

## 2016-09-27 NOTE — Progress Notes (Addendum)
[facility] DAILY ICU PROGRESS NOTE                   Carol Buchanan            Carol Buchanan          [phone]   1 Day Post-Op Procedure(s) (LRB): SUBXYPHOID PERICARDIAL WINDOW (N/A) CHEST TUBE INSERTION (Left) TRANSESOPHAGEAL ECHOCARDIOGRAM (TEE) (N/A)  Total Length of Stay:  LOS: 5 days   Subjective: Feels much better, + productive cough  Objective: Vital signs in last 24 hours: Temp:  [97.8 F (36.6 C)-99.4 F (37.4 C)] 97.8 F (36.6 C) (03/04 0810) Pulse Rate:  [104-116] 104 (03/04 0700) Cardiac Rhythm: Sinus tachycardia (03/04 0400) Resp:  [13-29] 13 (03/04 0700) BP: (115-141)/(67-91) 117/72 (03/04 0700) SpO2:  [79 %-100 %] 97 % (03/04 0847) Arterial Line BP: (147-155)/(66-68) 147/66 (03/03 1200) Weight:  [186 lb 15.2 oz (84.8 kg)] 186 lb 15.2 oz (84.8 kg) (03/04 0500)  Filed Weights   09/25/16 0500 09/26/16 0500 09/27/16 0500  Weight: 183 lb 10.3 oz (83.3 kg) 183 lb 3.2 oz (83.1 kg) 186 lb 15.2 oz (84.8 kg)    Weight change: 3 lb 12 oz (1.7 kg)   Hemodynamic parameters for last 24 hours:    Intake/Output from previous day: 03/03 0701 - 03/04 0700 In: 1423 [P.O.:480; I.V.:743; IV Piggyback:200] Out: 4410 [Urine:2800; Blood:10; Chest Tube:600]  Intake/Output this shift: No intake/output data recorded.  Current Meds: Scheduled Meds: . atorvastatin  10 mg Oral q1800  . cefUROXime (ZINACEF)  IV  1.5 g Intravenous Q8H  . diltiazem  120 mg Oral Daily  . DULoxetine  60 mg Oral Daily  . fluticasone  2 spray Each Nare Daily  . folic acid  1 mg Oral Daily  . ipratropium-albuterol  3 mL Nebulization TID  . levalbuterol  1.25 mg Nebulization Q6H  . montelukast  10 mg Oral QHS  . pantoprazole  40 mg Oral Daily  . polyethylene glycol  17 g Oral BID  . senna-docusate  1 tablet Oral BID  . vitamin C  1,000 mg Oral Daily   Continuous Infusions: . heparin 1,000 Units/hr (09/26/16 1607)   PRN Meds:.feeding supplement (ENSURE ENLIVE),  HYDROcodone-acetaminophen, LORazepam  General appearance: alert, cooperative and no distress Heart: regular rate and rhythm Lungs: coarse ronchi Abdomen: benign Extremities: PAS in place Wound: dressings CDI  Lab Results: CBC: Recent Labs  09/26/16 0219 09/27/16 0018  WBC 2.8* 6.1  HGB 7.8* 9.1*  HCT 24.4* 27.8*  PLT 149* 195   BMET:  Recent Labs  09/26/16 0219 09/27/16 0018  NA 136 137  K 3.1* 3.4*  CL 94* 94*  CO2 30 30  GLUCOSE 137* 110*  BUN 14 10  CREATININE 1.31* 1.36*  CALCIUM 8.7* 8.4*    CMET: Lab Results  Component Value Date   WBC 6.1 09/27/2016   HGB 9.1 (L) 09/27/2016   HCT 27.8 (L) 09/27/2016   PLT 195 09/27/2016   GLUCOSE 110 (H) 09/27/2016   ALT 32 09/26/2016   AST 34 09/26/2016   NA 137 09/27/2016   K 3.4 (L) 09/27/2016   CL 94 (L) 09/27/2016   CREATININE 1.36 (H) 09/27/2016   BUN 10 09/27/2016   CO2 30 09/27/2016   TSH 0.857 11/26/2015   INR 1.18 09/25/2016   HGBA1C 6.7 (H) 11/26/2015      PT/INR:  Recent Labs  09/25/16 1425  LABPROT 15.1  INR 1.18   Radiology: Dg Chest [facility]  1 View  Result Date: 09/27/2016 CLINICAL DATA:  Chest tube in place. EXAM: PORTABLE CHEST 1 VIEW COMPARISON:  Radiograph of September 26, 2016. FINDINGS: Stable cardiomediastinal silhouette. Endotracheal tube has been removed. Left-sided chest tube is unchanged in position. No definite pneumothorax is noted. Left perihilar and basilar opacity is noted concerning for pneumonia, tumor or atelectasis with associated pleural effusion. Right internal jugular Port-A-Cath is unchanged. Mild right basilar subsegmental atelectasis is noted. Bony thorax is unremarkable. IMPRESSION: Stable position of left-sided chest tube without definite pneumothorax. Left perihilar and basilar opacity is noted concerning for atelectasis, pneumonia or tumor with associated pleural effusion. Electronically Signed   By: Marijo Conception, M.D.   On: 09/27/2016 07:19     Assessment/Plan: S/P  Procedure(s) (LRB): SUBXYPHOID PERICARDIAL WINDOW (N/A) CHEST TUBE INSERTION (Left) TRANSESOPHAGEAL ECHOCARDIOGRAM (TEE) (N/A)   1 feeling better 2 push pulm toilet as able 3 moderate drainage, no air leaks- keep tubes in place today 4 H/H improved 5 creat stable 6 primary management per medicine and cardiology/pulmonology   Carol Buchanan 09/27/2016 10:50 AM   Minimal drainage from pericardial and pleural chest tubes 24 hours post procedure Proceed with heparin protocol for recent small bilateral pulmonary emboli-factor X activity 0.3 - 0.5' \\Path'$  on fluid and pericardial tissue pending

## 2016-09-27 NOTE — Progress Notes (Signed)
ANTICOAGULATION CONSULT NOTE - Follow Up Consult  Pharmacy Consult for Heparin Indication: pulmonary embolus  Allergies  Allergen Reactions  . No Known Allergies     Patient Measurements: Height: '5\' 4"'$  (162.6 cm) Weight: 186 lb 15.2 oz (84.8 kg) IBW/kg (Calculated) : 54.7  Vital Signs: Temp: 97.9 F (36.6 C) (03/04 1156) Temp Source: Oral (03/04 1156) BP: 122/71 (03/04 1200) Pulse Rate: 113 (03/04 1200)  Labs:  Recent Labs  09/25/16 0051  09/25/16 0327 09/25/16 1036 09/25/16 1425 09/25/16 2001 09/26/16 0219 09/27/16 0018 09/27/16 0506  HGB  --   < > 8.2*  --  8.6*  --  7.8* 9.1*  --   HCT  --   < > 25.2*  --  26.4*  --  24.4* 27.8*  --   PLT  --   < > 147*  --  170  --  149* 195  --   APTT 60*  --   --  65*  --  80*  --   --   --   LABPROT  --   --  16.3*  --  15.1  --   --   --   --   INR  --   --  1.30  --  1.18  --   --   --   --   HEPARINUNFRC  --   --   --  0.61  --   --   --  0.15* <0.10*  CREATININE  --   < > 1.54*  --  1.40*  --  1.31* 1.36*  --   < > = values in this interval not displayed.  Estimated Creatinine Clearance: 48.6 mL/min (by C-G formula based on SCr of 1.36 mg/dL (H)).  Assessment: 8 y/oF with PMH of lung cancer (last dose of Alimta on 09/14/16), anemia (s/p blood transfusion last week), COPD, DM, HLD, HTN who presented to [facility] ED on 09/22/16 with worsening shortness of breath. CTA of chest + for PE and heparin started. She was transitioned to Xarelto on 2/28 and subsequently back to heparin that same day anticipating pericardial window.   She is s/p pericardial window yesterday and heparin resumed after procedure. Heparin level is undetectable on 1000 units/hr. Spoke to Dr. Darcey Nora and ok to increase rate to target a goal of 0.3.   Goal of Therapy:  Heparin level 0.3 Monitor platelets by anticoagulation protocol: Yes   Plan:  1) Increase heparin to 1100 units/hr 2) Check 6 hour heparin level  Nena Jordan, PharmD, BCPS   09/27/2016 1:42 PM

## 2016-09-27 NOTE — Anesthesia Postprocedure Evaluation (Signed)
Anesthesia Post Note  Patient: Carol Buchanan  Procedure(s) Performed: Procedure(s) (LRB): SUBXYPHOID PERICARDIAL WINDOW (N/A) CHEST TUBE INSERTION (Left) TRANSESOPHAGEAL ECHOCARDIOGRAM (TEE) (N/A)  Patient location during evaluation: PACU Anesthesia Type: General Level of consciousness: awake and alert and patient cooperative Pain management: pain level controlled Vital Signs Assessment: post-procedure vital signs reviewed and stable Respiratory status: spontaneous breathing and respiratory function stable Cardiovascular status: stable Anesthetic complications: no       Last Vitals:  Vitals:   09/27/16 1600 09/27/16 1602  BP: 121/72 121/72  Pulse: (!) 110 (!) 109  Resp: (!) 24 (!) 26  Temp:  36.8 C    Last Pain:  Vitals:   09/27/16 1602  TempSrc: Oral  PainSc:                  [place] S

## 2016-09-27 NOTE — Progress Notes (Signed)
ANTICOAGULATION CONSULT NOTE - Follow Up Consult  Pharmacy Consult for Heparin Indication: pulmonary embolus  Allergies  Allergen Reactions  . No Known Allergies     Patient Measurements: Height: '5\' 4"'$  (162.6 cm) Weight: 186 lb 15.2 oz (84.8 kg) IBW/kg (Calculated) : 54.7  Vital Signs: Temp: 98.2 F (36.8 C) (03/04 1602) Temp Source: Oral (03/04 1602) BP: 125/84 (03/04 2000) Pulse Rate: 107 (03/04 2000)  Labs:  Recent Labs  09/25/16 0051  09/25/16 0327  09/25/16 1036 09/25/16 1425 09/25/16 2001 09/26/16 0219 09/27/16 0018 09/27/16 0506 09/27/16 1954  HGB  --   < > 8.2*  --   --  8.6*  --  7.8* 9.1*  --   --   HCT  --   < > 25.2*  --   --  26.4*  --  24.4* 27.8*  --   --   PLT  --   < > 147*  --   --  170  --  149* 195  --   --   APTT 60*  --   --   --  65*  --  80*  --   --   --   --   LABPROT  --   --  16.3*  --   --  15.1  --   --   --   --   --   INR  --   --  1.30  --   --  1.18  --   --   --   --   --   HEPARINUNFRC  --   --   --   < > 0.61  --   --   --  0.15* <0.10* 0.14*  CREATININE  --   < > 1.54*  --   --  1.40*  --  1.31* 1.36*  --   --   < > = values in this interval not displayed.  Estimated Creatinine Clearance: 48.6 mL/min (by C-G formula based on SCr of 1.36 mg/dL (H)).  Assessment: 34 y/oF with PMH of lung cancer (last dose of Alimta on 09/14/16), anemia (s/p blood transfusion last week), COPD, DM, HLD, HTN who presented to [facility] ED on 09/22/16 with worsening shortness of breath. CTA of chest + for PE and heparin started. She was transitioned to Xarelto on 2/28 and subsequently back to heparin that same day anticipating pericardial window.   She is s/p pericardial window yesterday and heparin resumed after procedure. Heparin level is 0.14 on 1100 units/hr. Spoke to Dr. Darcey Nora and ok to increase rate to target a goal of 0.3.   Goal of Therapy:  Heparin level 0.3 Monitor platelets by anticoagulation protocol: Yes   Plan:  1) Increase heparin to  1150 units/hr 2) Daily HL CBC  Levester Fresh, PharmD, BCPS, BCCCP Clinical Pharmacist 09/27/2016 8:28 PM

## 2016-09-28 ENCOUNTER — Encounter (HOSPITAL_COMMUNITY): Payer: Self-pay | Admitting: Cardiothoracic Surgery

## 2016-09-28 ENCOUNTER — Inpatient Hospital Stay (HOSPITAL_COMMUNITY): Payer: Medicare Other

## 2016-09-28 DIAGNOSIS — Z9689 Presence of other specified functional implants: Secondary | ICD-10-CM

## 2016-09-28 DIAGNOSIS — I2782 Chronic pulmonary embolism: Secondary | ICD-10-CM

## 2016-09-28 DIAGNOSIS — Z978 Presence of other specified devices: Secondary | ICD-10-CM

## 2016-09-28 DIAGNOSIS — I2609 Other pulmonary embolism with acute cor pulmonale: Secondary | ICD-10-CM

## 2016-09-28 LAB — TYPE AND SCREEN
ABO/RH(D): O POS
Antibody Screen: NEGATIVE
Unit division: 0
Unit division: 0

## 2016-09-28 LAB — GLUCOSE, CAPILLARY
GLUCOSE-CAPILLARY: 121 mg/dL — AB (ref 65–99)
GLUCOSE-CAPILLARY: 122 mg/dL — AB (ref 65–99)
GLUCOSE-CAPILLARY: 135 mg/dL — AB (ref 65–99)
Glucose-Capillary: 138 mg/dL — ABNORMAL HIGH (ref 65–99)

## 2016-09-28 LAB — BPAM RBC
Blood Product Expiration Date: 201804012359
Blood Product Expiration Date: 201804012359
ISSUE DATE / TIME: 201803030718
ISSUE DATE / TIME: 201803030718
Unit Type and Rh: 5100
Unit Type and Rh: 5100

## 2016-09-28 LAB — COMPREHENSIVE METABOLIC PANEL
ALT: 23 U/L (ref 14–54)
ANION GAP: 10 (ref 5–15)
AST: 22 U/L (ref 15–41)
Albumin: 2.2 g/dL — ABNORMAL LOW (ref 3.5–5.0)
Alkaline Phosphatase: 128 U/L — ABNORMAL HIGH (ref 38–126)
BILIRUBIN TOTAL: 0.8 mg/dL (ref 0.3–1.2)
BUN: 9 mg/dL (ref 6–20)
CHLORIDE: 97 mmol/L — AB (ref 101–111)
CO2: 29 mmol/L (ref 22–32)
Calcium: 8.1 mg/dL — ABNORMAL LOW (ref 8.9–10.3)
Creatinine, Ser: 1.22 mg/dL — ABNORMAL HIGH (ref 0.44–1.00)
GFR, EST AFRICAN AMERICAN: 56 mL/min — AB (ref >60)
GFR, EST NON AFRICAN AMERICAN: 49 mL/min — AB (ref >60)
Glucose, Bld: 135 mg/dL — ABNORMAL HIGH (ref 65–99)
Potassium: 3.2 mmol/L — ABNORMAL LOW (ref 3.5–5.1)
Sodium: 136 mmol/L (ref 135–145)
TOTAL PROTEIN: 5.9 g/dL — AB (ref 6.5–8.1)

## 2016-09-28 LAB — HEPARIN LEVEL (UNFRACTIONATED)
HEPARIN UNFRACTIONATED: 0.13 [IU]/mL — AB (ref 0.30–0.70)
HEPARIN UNFRACTIONATED: 0.22 [IU]/mL — AB (ref 0.30–0.70)
HEPARIN UNFRACTIONATED: 0.21 [IU]/mL — AB (ref 0.30–0.70)

## 2016-09-28 LAB — CBC
HCT: 27.8 % — ABNORMAL LOW (ref 36.0–46.0)
Hemoglobin: 9 g/dL — ABNORMAL LOW (ref 12.0–15.0)
MCH: 33.2 pg (ref 26.0–34.0)
MCHC: 32.4 g/dL (ref 30.0–36.0)
MCV: 102.6 fL — AB (ref 78.0–100.0)
PLATELETS: 228 10*3/uL (ref 150–400)
RBC: 2.71 MIL/uL — ABNORMAL LOW (ref 3.87–5.11)
RDW: 18.2 % — AB (ref 11.5–15.5)
WBC: 9 10*3/uL (ref 4.0–10.5)

## 2016-09-28 MED ORDER — POTASSIUM CHLORIDE CRYS ER 20 MEQ PO TBCR
40.0000 meq | EXTENDED_RELEASE_TABLET | Freq: Once | ORAL | Status: AC
  Administered 2016-09-28: 40 meq via ORAL
  Filled 2016-09-28: qty 2

## 2016-09-28 MED ORDER — POTASSIUM CHLORIDE CRYS ER 20 MEQ PO TBCR
40.0000 meq | EXTENDED_RELEASE_TABLET | Freq: Three times a day (TID) | ORAL | Status: AC
  Administered 2016-09-28 – 2016-09-29 (×3): 40 meq via ORAL
  Filled 2016-09-28 (×3): qty 2

## 2016-09-28 NOTE — Progress Notes (Signed)
[address]       [place] [address]             [phone]      2 Days Post-Op Procedure(s) (LRB): SUBXYPHOID PERICARDIAL WINDOW (N/A) CHEST TUBE INSERTION (Left) TRANSESOPHAGEAL ECHOCARDIOGRAM (TEE) (N/A) Subjective: Feeling better  Objective: Vital signs in last 24 hours: Temp:  [97.9 F (36.6 C)-98.5 F (36.9 C)] 98.5 F (36.9 C) (03/05 0806) Pulse Rate:  [101-117] 102 (03/05 0600) Cardiac Rhythm: Sinus tachycardia (03/05 0400) Resp:  [16-26] 18 (03/05 0600) BP: (90-143)/(59-97) 113/62 (03/05 0600) SpO2:  [91 %-99 %] 98 % (03/05 0833) Weight:  [188 lb 4.4 oz (85.4 kg)] 188 lb 4.4 oz (85.4 kg) (03/05 0500)  Hemodynamic parameters for last 24 hours:    Intake/Output from previous day: 03/04 0701 - 03/05 0700 In: 1079.5 [P.O.:720; I.V.:259.5; IV Piggyback:100] Out: 2359 [Urine:2275; Chest Tube:84] Intake/Output this shift: No intake/output data recorded.  General appearance: alert, cooperative and no distress Heart: regular rate and rhythm Lungs: clear to auscultation bilaterally Abdomen: benign Extremities: + edema Wound: dressings CDI  Lab Results:  Recent Labs  09/27/16 0018 09/28/16 0209  WBC 6.1 9.0  HGB 9.1* 9.0*  HCT 27.8* 27.8*  PLT 195 228   BMET:  Recent Labs  09/27/16 0018 09/28/16 0209  NA 137 136  K 3.4* 3.2*  CL 94* 97*  CO2 30 29  GLUCOSE 110* 135*  BUN 10 9  CREATININE 1.36* 1.22*  CALCIUM 8.4* 8.1*    PT/INR:  Recent Labs  09/25/16 1425  LABPROT 15.1  INR 1.18   ABG    Component Value Date/Time   PHART 7.487 (H) 09/25/2016 1528   HCO3 30.9 (H) 09/25/2016 1528   TCO2 15.0 11/25/2015 2143   ACIDBASEDEF 6.8 (H) 11/25/2015 2143   O2SAT 97.6 09/25/2016 1528   CBG (last 3)   Recent Labs  09/27/16 1613 09/27/16 2155 09/28/16 0808  GLUCAP 134* 117* 121*    Meds Scheduled Meds: . atorvastatin  10 mg Oral q1800  . diltiazem  120 mg Oral Daily  . DULoxetine  60 mg Oral Daily  .  fluticasone  2 spray Each Nare Daily  . folic acid  1 mg Oral Daily  . insulin aspart  0-5 Units Subcutaneous QHS  . insulin aspart  0-9 Units Subcutaneous TID WC  . levalbuterol  1.25 mg Nebulization TID  . montelukast  10 mg Oral QHS  . pantoprazole  40 mg Oral Daily  . polyethylene glycol  17 g Oral BID  . senna-docusate  1 tablet Oral BID  . vitamin C  1,000 mg Oral Daily   Continuous Infusions: . heparin 1,300 Units/hr (09/28/16 0403)   PRN Meds:.feeding supplement (ENSURE ENLIVE), HYDROcodone-acetaminophen, levalbuterol, LORazepam  Xrays Dg Chest Port 1 View  Result Date: 09/28/2016 CLINICAL DATA:  Pericardial effusion. Left pleural effusion. Recent pulmonary emboli. EXAM: PORTABLE CHEST 1 VIEW COMPARISON:  Chest x-rays dated 09/27/2016 and 09/26/2016 and chest CT dated 09/22/2016 FINDINGS: Power port and left chest tube remain in good position. Small loculated left effusion persists. Left perihilar scarring from previous radiation therapy for non-small cell carcinoma of the lung. The right lung is clear.  Pulmonary vascularity is normal. IMPRESSION: No significant change since the prior study. Small amount of residual loculated fluid on the left superimposed on chronic scarring from prior radiation therapy. Electronically Signed   By: Lorriane Shire M.D.   On: 09/28/2016 07:22   Dg Chest [facility]  1 View  Result Date: 09/27/2016 CLINICAL DATA:  Chest tube in place. EXAM: PORTABLE CHEST 1 VIEW COMPARISON:  Radiograph of September 26, 2016. FINDINGS: Stable cardiomediastinal silhouette. Endotracheal tube has been removed. Left-sided chest tube is unchanged in position. No definite pneumothorax is noted. Left perihilar and basilar opacity is noted concerning for pneumonia, tumor or atelectasis with associated pleural effusion. Right internal jugular Port-A-Cath is unchanged. Mild right basilar subsegmental atelectasis is noted. Bony thorax is unremarkable. IMPRESSION: Stable position of left-sided  chest tube without definite pneumothorax. Left perihilar and basilar opacity is noted concerning for atelectasis, pneumonia or tumor with associated pleural effusion. Electronically Signed   By: Marijo Conception, M.D.   On: 09/27/2016 07:19   Dg Chest Portable 1 View  Result Date: 09/26/2016 CLINICAL DATA:  Elective surgery.  Lung cancer. EXAM: PORTABLE CHEST 1 VIEW COMPARISON:  Chest radiograph from earlier today. FINDINGS: Endotracheal tube terminates at the level of the carina over the origin of the right mainstem bronchus. Right internal jugular MediPort terminates in the right atrium. Left apical chest tube and pericardial drain overlying the left lung base are in place. Top-normal heart size, with interval drainage of pericardial effusion. Otherwise stable mediastinal contour. Small peripheral basilar left hydropneumothorax. No right pneumothorax. No right pleural effusion. Patchy opacity throughout the parahilar and basilar left lung appears stable. IMPRESSION: 1. Small peripheral basilar left hydropneumothorax. Well-positioned left apical chest tube. 2. Interval drainage of pericardial effusion with top-normal heart size and pericardial drain in place. 3. Low lying endotracheal tube at the level of the carina overlying the right mainstem bronchus origin, recommend retraction. 4. Stable patchy opacity throughout the parahilar and basilar left lung, probably a combination of tumor, atelectasis and/or posttreatment change. Critical Value/emergent results were called by telephone at the time of interpretation on 09/26/2016 at 10:04 am to Dr. Ivin Poot , who verbally acknowledged these results. Electronically Signed   By: Ilona Sorrel M.D.   On: 09/26/2016 10:07   Chest tubes- 90 cc yesterday, 54 so far today. No air leaks   Assessment/Plan: S/P Procedure(s) (LRB): SUBXYPHOID PERICARDIAL WINDOW (N/A) CHEST TUBE INSERTION (Left) TRANSESOPHAGEAL ECHOCARDIOGRAM (TEE) (N/A)  1 doing better 2 path  pending 3 poss d/c tubes soon  LOS: 6 days    Devota Viruet E 09/28/2016

## 2016-09-28 NOTE — Progress Notes (Signed)
[place] for Heparin Indication: pulmonary embolus  Allergies  Allergen Reactions  . No Known Allergies     Patient Measurements: Height: '5\' 4"'$  (162.6 cm) Weight: 186 lb 15.2 oz (84.8 kg) IBW/kg (Calculated) : 54.7  Vital Signs: Temp: 98.3 F (36.8 C) (03/05 0000) Temp Source: Oral (03/05 0000) BP: 112/97 (03/05 0200) Pulse Rate: 103 (03/05 0200)  Labs:  Recent Labs  09/25/16 1036  09/25/16 1425 09/25/16 2001 09/26/16 0219 09/27/16 0018 09/27/16 0506 09/27/16 1954 09/28/16 0209  HGB  --   < > 8.6*  --  7.8* 9.1*  --   --  9.0*  HCT  --   < > 26.4*  --  24.4* 27.8*  --   --  27.8*  PLT  --   < > 170  --  149* 195  --   --  228  APTT 65*  --   --  80*  --   --   --   --   --   LABPROT  --   --  15.1  --   --   --   --   --   --   INR  --   --  1.18  --   --   --   --   --   --   HEPARINUNFRC 0.61  --   --   --   --  0.15* <0.10* 0.14* 0.13*  CREATININE  --   < > 1.40*  --  1.31* 1.36*  --   --  1.22*  < > = values in this interval not displayed.  Estimated Creatinine Clearance: 54.2 mL/min (by C-G formula based on SCr of 1.22 mg/dL (H)).  Assessment: 56 y/o Female with new PE, s/p pericardial window 3/3, for heparin.  H/H stable and RN reports minimal drainage from chest tube.    Goal of Therapy:  Heparin level 0.3 - 0.5 Monitor platelets by anticoagulation protocol: Yes   Plan:  Increase Heparin 1300 units/hr Check heparin level in 8 hours.   Phillis Knack, PharmD, BCPS  09/28/2016 4:01 AM

## 2016-09-28 NOTE — Progress Notes (Signed)
ANTICOAGULATION CONSULT NOTE - Follow Up Consult  Pharmacy Consult for heparin Indication: pulmonary embolus  Allergies  Allergen Reactions  . No Known Allergies     Patient Measurements: Height: '5\' 4"'$  (162.6 cm) Weight: 188 lb 4.4 oz (85.4 kg) IBW/kg (Calculated) : 54.7 Heparin Dosing Weight: 73 kg  Vital Signs: Temp: 98.5 F (36.9 C) (03/05 2022) Temp Source: Oral (03/05 2022) BP: 114/71 (03/05 2022) Pulse Rate: 107 (03/05 2022)  Labs:  Recent Labs  09/26/16 0219 09/27/16 0018  09/28/16 0209 09/28/16 1232 09/28/16 2032  HGB 7.8* 9.1*  --  9.0*  --   --   HCT 24.4* 27.8*  --  27.8*  --   --   PLT 149* 195  --  228  --   --   HEPARINUNFRC  --  0.15*  < > 0.13* 0.22* 0.21*  CREATININE 1.31* 1.36*  --  1.22*  --   --   < > = values in this interval not displayed.  Estimated Creatinine Clearance: 54.5 mL/min (by C-G formula based on SCr of 1.22 mg/dL (H)).   Medications:  Scheduled:  . atorvastatin  10 mg Oral q1800  . diltiazem  120 mg Oral Daily  . DULoxetine  60 mg Oral Daily  . fluticasone  2 spray Each Nare Daily  . folic acid  1 mg Oral Daily  . insulin aspart  0-5 Units Subcutaneous QHS  . insulin aspart  0-9 Units Subcutaneous TID WC  . levalbuterol  1.25 mg Nebulization TID  . montelukast  10 mg Oral QHS  . pantoprazole  40 mg Oral Daily  . polyethylene glycol  17 g Oral BID  . potassium chloride  40 mEq Oral TID  . senna-docusate  1 tablet Oral BID  . vitamin C  1,000 mg Oral Daily   Infusions:  . heparin 1,450 Units/hr (09/28/16 1440)    Assessment: 56 yo F on heparin for bilateral PE.  Pt is s/p pericardial window and currently has a chest tube in place.  Heparin level is subtherapeutic on 1450 units/hr.  No bleeding or IV issues noted.   Goal of Therapy:  Heparin level 0.3-0.5 units/ml Monitor platelets by anticoagulation protocol: Yes   Plan:  Increase heparin to 1600 units/hr. Next heparin level with AM labs. Heparin level and CBC  daily while on heparin.  [employer], Pharm.D., BCPS Clinical Pharmacist Pager [phone] 09/28/2016 9:37 PM

## 2016-09-28 NOTE — Progress Notes (Signed)
Progress Note  Patient Name: Carol Buchanan Date of Encounter: 09/28/2016  Primary Cardiologist: Dr. Acie Fredrickson  Subjective   Patient is feeling well; denies chest pain, SOB, and palpitations. She is sitting up in the chair this morning.   Inpatient Medications    Scheduled Meds: . atorvastatin  10 mg Oral q1800  . diltiazem  120 mg Oral Daily  . DULoxetine  60 mg Oral Daily  . fluticasone  2 spray Each Nare Daily  . folic acid  1 mg Oral Daily  . insulin aspart  0-5 Units Subcutaneous QHS  . insulin aspart  0-9 Units Subcutaneous TID WC  . levalbuterol  1.25 mg Nebulization TID  . montelukast  10 mg Oral QHS  . pantoprazole  40 mg Oral Daily  . polyethylene glycol  17 g Oral BID  . senna-docusate  1 tablet Oral BID  . vitamin C  1,000 mg Oral Daily   Continuous Infusions: . heparin 1,300 Units/hr (09/28/16 0403)   PRN Meds: feeding supplement (ENSURE ENLIVE), HYDROcodone-acetaminophen, levalbuterol, LORazepam   Vital Signs    Vitals:   09/28/16 0500 09/28/16 0600 09/28/16 0806 09/28/16 0833  BP: 118/69 113/62    Pulse: (!) 101 (!) 102    Resp: 16 18    Temp:   98.5 F (36.9 C)   TempSrc:   Oral   SpO2: 98% 99%  98%  Weight: 188 lb 4.4 oz (85.4 kg)     Height:        Intake/Output Summary (Last 24 hours) at 09/28/16 0855 Last data filed at 09/28/16 0600  Gross per 24 hour  Intake           839.53 ml  Output             2359 ml  Net         -1519.47 ml   Filed Weights   09/26/16 0500 09/27/16 0500 09/28/16 0500  Weight: 183 lb 3.2 oz (83.1 kg) 186 lb 15.2 oz (84.8 kg) 188 lb 4.4 oz (85.4 kg)     Physical Exam   General: Well developed, well nourished, female appearing in no acute distress. Head: Normocephalic, atraumatic.  Neck: Supple without bruits, JVD Lungs:  Resp regular and unlabored, CTA. Left pleural drain in place with dressing Heart: Regular rhythm, tachycardic rate, S1, S2, no murmur; no rub, pericardial drain in place with  dressing Abdomen: Soft, non-tender, non-distended with normoactive bowel sounds. No hepatomegaly. No rebound/guarding. No obvious abdominal masses. Extremities: No clubbing, cyanosis, No edema. Distal pedal pulses are 2+ bilaterally. Neuro: Alert and oriented X 3. Moves all extremities spontaneously. Psych: Normal affect.  Labs    Chemistry Recent Labs Lab 09/25/16 1425 09/26/16 0219 09/27/16 0018 09/28/16 0209  NA 135 136 137 136  K 3.4* 3.1* 3.4* 3.2*  CL 93* 94* 94* 97*  CO2 '27 30 30 29  '$ GLUCOSE 138* 137* 110* 135*  BUN '15 14 10 9  '$ CREATININE 1.40* 1.31* 1.36* 1.22*  CALCIUM 8.9 8.7* 8.4* 8.1*  PROT 6.9 6.3*  --  5.9*  ALBUMIN 2.9* 2.6*  --  2.2*  AST 36 34  --  22  ALT 34 32  --  23  ALKPHOS 145* 143*  --  128*  BILITOT 0.5 0.4  --  0.8  GFRNONAA 41* 45* 43* 49*  GFRAA 48* 52* 49* 56*  ANIONGAP '15 12 13 10     '$ Hematology Recent Labs Lab 09/26/16 1884 09/27/16 0018 09/28/16 0209  WBC 2.8* 6.1 9.0  RBC 2.37* 2.71* 2.71*  HGB 7.8* 9.1* 9.0*  HCT 24.4* 27.8* 27.8*  MCV 103.0* 102.6* 102.6*  MCH 32.9 33.6 33.2  MCHC 32.0 32.7 32.4  RDW 17.7* 17.7* 18.2*  PLT 149* 195 228    Cardiac EnzymesNo results for input(s): TROPONINI in the last 168 hours.  Recent Labs Lab 09/22/16 1646  TROPIPOC 0.00     BNP Recent Labs Lab 09/22/16 1636  BNP 72.8     DDimer No results for input(s): DDIMER in the last 168 hours.   Radiology    Dg Chest Port 1 View  Result Date: 09/28/2016 CLINICAL DATA:  Pericardial effusion. Left pleural effusion. Recent pulmonary emboli. EXAM: PORTABLE CHEST 1 VIEW COMPARISON:  Chest x-rays dated 09/27/2016 and 09/26/2016 and chest CT dated 09/22/2016 FINDINGS: Power port and left chest tube remain in good position. Small loculated left effusion persists. Left perihilar scarring from previous radiation therapy for non-small cell carcinoma of the lung. The right lung is clear.  Pulmonary vascularity is normal. IMPRESSION: No significant  change since the prior study. Small amount of residual loculated fluid on the left superimposed on chronic scarring from prior radiation therapy. Electronically Signed   By: Lorriane Shire M.D.   On: 09/28/2016 07:22   Dg Chest Port 1 View  Result Date: 09/27/2016 CLINICAL DATA:  Chest tube in place. EXAM: PORTABLE CHEST 1 VIEW COMPARISON:  Radiograph of September 26, 2016. FINDINGS: Stable cardiomediastinal silhouette. Endotracheal tube has been removed. Left-sided chest tube is unchanged in position. No definite pneumothorax is noted. Left perihilar and basilar opacity is noted concerning for pneumonia, tumor or atelectasis with associated pleural effusion. Right internal jugular Port-A-Cath is unchanged. Mild right basilar subsegmental atelectasis is noted. Bony thorax is unremarkable. IMPRESSION: Stable position of left-sided chest tube without definite pneumothorax. Left perihilar and basilar opacity is noted concerning for atelectasis, pneumonia or tumor with associated pleural effusion. Electronically Signed   By: Marijo Conception, M.D.   On: 09/27/2016 07:19   Dg Chest Portable 1 View  Result Date: 09/26/2016 CLINICAL DATA:  Elective surgery.  Lung cancer. EXAM: PORTABLE CHEST 1 VIEW COMPARISON:  Chest radiograph from earlier today. FINDINGS: Endotracheal tube terminates at the level of the carina over the origin of the right mainstem bronchus. Right internal jugular MediPort terminates in the right atrium. Left apical chest tube and pericardial drain overlying the left lung base are in place. Top-normal heart size, with interval drainage of pericardial effusion. Otherwise stable mediastinal contour. Small peripheral basilar left hydropneumothorax. No right pneumothorax. No right pleural effusion. Patchy opacity throughout the parahilar and basilar left lung appears stable. IMPRESSION: 1. Small peripheral basilar left hydropneumothorax. Well-positioned left apical chest tube. 2. Interval drainage of  pericardial effusion with top-normal heart size and pericardial drain in place. 3. Low lying endotracheal tube at the level of the carina overlying the right mainstem bronchus origin, recommend retraction. 4. Stable patchy opacity throughout the parahilar and basilar left lung, probably a combination of tumor, atelectasis and/or posttreatment change. Critical Value/emergent results were called by telephone at the time of interpretation on 09/26/2016 at 10:04 am to Dr. Ivin Poot , who verbally acknowledged these results. Electronically Signed   By: Ilona Sorrel M.D.   On: 09/26/2016 10:07     Telemetry    Sinus tachycardia 100-110s - Personally Reviewed  ECG     09/25/16: Sinus tachycardia, 101 bpm  - Personally Reviewed   Cardiac Studies   TEE  09/26/16:  Left ventricle: Normal cavity size, wall thickness, left ventricular diastolic function and left atrial pressure. LV systolic function is low normal with an EF of 50-55%.  Aortic valve: No stenosis. Trace regurgitation.  Mitral valve: Trace regurgitation.  Right ventricle: Normal cavity size, wall thickness and ejection fraction. Mildly compressed by pericardial fluid. This is notably improved after pericardial window procedure.  Tricuspid valve: Mild to moderate regurgitation.  Pericardium: Circumferential pericardial effusion. Effusion size is 197m.     Patient Profile     56y.o. female with lung CA, pericardial effusion, pleural effusion, and PE who presented with chest pain.   Assessment & Plan    1. Pericardial effusion - s/p pericardial window yesterday - mediastinal drain with 32 cc/24 hr - pt states chest pain is better   2. Pleural effusion - s/p CT placement - CT to suction with 52 cc/24 hr   3. HTN - pt is tolerating PO cardizem; BP 110-120 / 60-70s - HR continues in the 100-110s, likely secondary to PE and hypoxia - hold home verapamil    4. AKI - sCr 1.22 (1.36) with good urine output - diuresed  2.3 L yesterday   5. Hypokalemia - K 3.2 (3.4); replace per primary team to keep K closer to 4    Signed, ALedora Bottcher, PA-C 8:55 AM 09/28/2016 Pager: [phone] Personally seen and examined. Agree with above.  Feels better, no CP, no bleeding.  Lung CA with pericardial effusion worsening - post window. Path report P. Improved.  PE, hypoxia --> sinus tachy 110. Continuing cardizem. Back on heparin IV. Will need transition to oral anticoagulation when there is no evidence of bleeding.  Drains in place. Lungs clear. Tachy RR.  Will follow.   MCandee Furbish MD

## 2016-09-28 NOTE — Op Note (Signed)
Carol Buchanan, Carol Buchanan            ACCOUNT NO.:  [id number]  MEDICAL RECORD NO.:  [id number]  LOCATION:                                 FACILITY:  PHYSICIAN:  Ivin Poot, M.D.  DATE OF BIRTH:  March 18, 1961  DATE OF PROCEDURE:  09/26/2016 DATE OF DISCHARGE:                              OPERATIVE REPORT   OPERATION: 1. Subxiphoid pericardial drainage of 500 mL of pericardial effusion. 2. Placement of left chest tube for drainage of 500 mL of left pleural     effusion.  SURGEON:  Ivin Poot, M.D.  ASSISTANT:  John Giovanni, PA-C.  PREOPERATIVE DIAGNOSES:  History of advanced stage lung cancer, large pericardial effusion, moderate-large left pleural effusion.  POSTOPERATIVE DIAGNOSES:  History of advanced stage lung cancer, large pericardial effusion, moderate-large left pleural effusion.  ANESTHESIA:  General.  INDICATIONS:  The patient is a 56 year old female treated for advanced non-small cell carcinoma.  She presented with shortness of breath.  CTA showed bilateral lower lobe pulmonary emboli.  Also, showed a significant pericardial effusion, which was documented by 2D echocardiogram.  There was no evidence of tamponade.  She also had a loculated left pleural effusion.  The patient was started on Xarelto as well as heparin for the pulmonary emboli.  A thoracic surgical consultation was obtained the next day.  The patient continued on her heparin for the pulmonary embolus, but the Xarelto was stopped. Pericardial window and left chest tube placement recommended after a period of washout of the Xarelto because the patient was stable.  I discussed the procedure of pericardial window and left chest tube placement with the patient as well as her mother.  I discussed the indications and expected benefits, alternatives, and risks to include bleeding, recurrent effusions, infection, death.  She understood that she would have 2 drains in her chest, 1 in the pericardium, 1  in the pleural space, and that there would be postoperative pain, which would be treatable, but not eliminated.  She agreed to proceed with surgery under what I felt was an informed consent.  OPERATIVE FINDINGS: 1. A 500 mL of clear pericardial effusion.  The epicardium had no     evidence of nodularity or gross tumor.  Pericardial tissue and     fluid were sent for both pathology and cytology.  Pericardial     tissue and fluid were sent both for culture. 2. 500 mL of cloudy fluid of the left pleural effusion sent for both     culture and cytology. 3. 28-French chest tube placed in left pleural space, 28-French Bard     drain placed in the pericardial space.  OPERATIVE PROCEDURE:  The patient was brought directly from preop holding, where she had been assessed and prepared by Anesthesia and informed consent had been repeated.  She was placed supine on the operating table and general anesthesia was induced.  She remained stable.  A transesophageal echo probe was placed by the Anesthesia team. A proper time-out was performed after the patient was prepped and draped as a sterile field.  A small incision was made based on the xiphoid. The xiphoid was excised.  Dissection was carried down to the mediastinal  fat.  The pericardium was identified and cleared of soft tissue. Incision was made in the pericardium and immediately clear fluid under pressure exited.  This was drained and totaled 500 mL.  A pericardial window measuring 3 cm was created by excising pericardium anteriorly and this was sent for studies.  A 28-French Bard drain was placed in the pericardium dependently and brought out through separate incision and secured to the skin.  The retractor for the sternum was withdrawn and the incision was closed in layers using Vicryl for the fascia, running 2- 0 Vicryl for the subcutaneous, and subcuticular for the skin.  A left chest tube was placed in the left anterior axillary line in  the fifth interspace.  Cloudy fluid was removed and this was sent for culture as well as cytology.  A 28-French chest tube was advanced and secured to the skin and connected to a Pleur-evac underwater seal drainage system.  The patient had both areas dressed with sterile dressings.  Portable chest x-ray in the operating room was ordered.     Ivin Poot, M.D.   ______________________________ Ivin Poot, M.D.    PV/MEDQ  D:  09/26/2016  T:  09/27/2016  Job:  [id number]  cc:   Jettie Booze, MD

## 2016-09-28 NOTE — Progress Notes (Signed)
[facility] for Heparin Indication: pulmonary embolus  Allergies  Allergen Reactions  . No Known Allergies     Patient Measurements: Height: '5\' 4"'$  (162.6 cm) Weight: 188 lb 4.4 oz (85.4 kg) IBW/kg (Calculated) : 54.7  Heparin dosing weight 73 kg    Vital Signs: Temp: 98.6 F (37 C) (03/05 1148) Temp Source: Oral (03/05 1148) BP: 104/73 (03/05 1200) Pulse Rate: 106 (03/05 1200)  Labs:  Recent Labs  09/25/16 1425 09/25/16 2001 09/26/16 0219 09/27/16 0018  09/27/16 1954 09/28/16 0209 09/28/16 1232  HGB 8.6*  --  7.8* 9.1*  --   --  9.0*  --   HCT 26.4*  --  24.4* 27.8*  --   --  27.8*  --   PLT 170  --  149* 195  --   --  228  --   APTT  --  80*  --   --   --   --   --   --   LABPROT 15.1  --   --   --   --   --   --   --   INR 1.18  --   --   --   --   --   --   --   HEPARINUNFRC  --   --   --  0.15*  < > 0.14* 0.13* 0.22*  CREATININE 1.40*  --  1.31* 1.36*  --   --  1.22*  --   < > = values in this interval not displayed.  Estimated Creatinine Clearance: 54.5 mL/min (by C-G formula based on SCr of 1.22 mg/dL (H)).  Assessment: 56 y/o Female with new PE, s/p pericardial window on 3/3, for heparin. H/H stable with platelets 228. minimal drainage from chest tube. Current heparin gtt 1300 units/hr  Heparin level is subtherapeutic at 0.22  Goal of Therapy:  Heparin level 0.3 - 0.5 Monitor platelets by anticoagulation protocol: Yes   Plan:  Increase Heparin rate to 1450 units/hr Check heparin level in 6 hours.  Daily CBC   Jeananne Rama, PharmD Candidate 09/28/2016 1:44 PM

## 2016-09-28 NOTE — Progress Notes (Signed)
[facility] TEAM 1 - Stepdown/ICU TEAM  Carol Buchanan  ZOX:[id number] DOB: Jul 19, 1961 DOA: 09/22/2016 PCP: Andria Frames, MD    Brief Narrative:  56 y.o. female with history of [place] Lung CA dx'd in Nov 2016. Was treated with XRT initially and has been on chemoRx since then per Dr Marin Olp. Has had mets to rib and spine. Presented with one month hx worsening SOB. CT angio of chest in ED noted bilat segmental and subsegmental pulm emboli w/ no R heart strain.  Subjective: The patient is doing very well.  She denies cp, sob, n/v, or abdom pain.  She is anxious to get moving/walking.    Assessment & Plan:  Acute Pulmonary embolism Cont IV heparin for now - transition to oral tx when cleared by TCTS   Pericardial effusion w/ tamponade - Pleural effusion  TTE noted physiologic tamponade - s/p pericardial window/drain and chest tube - tube management per TCTS  Acute kidney injury on chronic kidney disease stage III Baseline creatinine appears to be approximately 1.4 - avoid diuresis, contrast, ACEi/ARB - renal fxn stable at baseline  Recent Labs Lab 09/25/16 0327 09/25/16 1425 09/26/16 0219 09/27/16 0018 09/28/16 0209  CREATININE 1.54* 1.40* 1.31* 1.36* 1.22*    Pancytopenia Due to active chemotherapy  Chronic diastolic heart failure Baseline weight appears to be approximately 83 kg - net negative ~6 L since admission - no signif overload at this time  Filed Weights   09/26/16 0500 09/27/16 0500 09/28/16 0500  Weight: 83.1 kg (183 lb 3.2 oz) 84.8 kg (186 lb 15.2 oz) 85.4 kg (188 lb 4.4 oz)    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  DVT prophylaxis: IV heparin Code Status: FULL CODE Family  Communication: no family present at time of exam  Disposition Plan: stable for SDU capable of handling chest tube/pericaridal drain  Consultants:  TCTS PCCM Cardiology  Procedures: 3/1 TTE - EF 55-60 percent with no regional wall motion abnormalities with large pericardial effusion and features suggestive of tamponade   2/28 bilateral lower extremity venous duplex without evidence of DVT  3/3 subxyphoid pericardial window - L chest tube   Antimicrobials:  none  Objective: Blood pressure 115/65, pulse (!) 108, temperature 98.5 F (36.9 C), temperature source Oral, resp. rate 18, height '5\' 4"'$  (1.626 m), weight 85.4 kg (188 lb 4.4 oz), SpO2 99 %.  Intake/Output Summary (Last 24 hours) at 09/28/16 1044 Last data filed at 09/28/16 1000  Gross per 24 hour  Intake           891.53 ml  Output             2834 ml  Net         -1942.47 ml   Filed Weights   09/26/16 0500 09/27/16 0500 09/28/16 0500  Weight: 83.1 kg (183 lb 3.2 oz) 84.8 kg (186 lb 15.2 oz) 85.4 kg (188 lb 4.4 oz)    Examination: General: No acute respiratory distress - alert and pleasant  Lungs: Clear to auscultation bilaterally w/o wheezing  Cardiovascular: Regular rate and rhythm  Abdomen: Nondistended, soft, bs+  Extremities: no signif B LE edema   CBC:  Recent Labs Lab 09/22/16 1636 09/23/16 0201  09/25/16 0327 09/25/16 1425 09/26/16 0219 09/27/16 0018 09/28/16 0209  WBC 1.6* 1.5*  < > 3.2* 3.3* 2.8* 6.1 9.0  NEUTROABS 0.8* 0.7*  --   --   --   --   --   --   HGB 8.0* 8.1*  < > 8.2* 8.6* 7.8* 9.1* 9.0*  HCT 23.8* 23.8*  < > 25.2* 26.4* 24.4* 27.8* 27.8*  MCV 97.9 100.0  < > 103.3* 103.1* 103.0* 102.6* 102.6*  PLT 134* 127*  < > 147* 170 149* 195 228  < > = values in this interval not displayed. Basic Metabolic Panel:  Recent Labs Lab 09/25/16 0327 09/25/16 1425 09/26/16 0219 09/27/16 0018 09/28/16 0209  NA 139 135 136 137 136  K 3.9 3.4* 3.1* 3.4* 3.2*  CL 94* 93* 94* 94* 97*  CO2 '31 27  30 30 29  '$ GLUCOSE 98 138* 137* 110* 135*  BUN '15 15 14 10 9  '$ CREATININE 1.54* 1.40* 1.31* 1.36* 1.22*  CALCIUM 9.0 8.9 8.7* 8.4* 8.1*   GFR: Estimated Creatinine Clearance: 54.5 mL/min (by C-G formula based on SCr of 1.22 mg/dL (H)).  Liver Function Tests:  Recent Labs Lab 09/22/16 1636 09/25/16 0327 09/25/16 1425 09/26/16 0219 09/28/16 0209  AST 40 31 36 34 22  ALT 41 32 34 32 23  ALKPHOS 157* 142* 145* 143* 128*  BILITOT 0.5 0.9 0.5 0.4 0.8  PROT 7.2 6.8 6.9 6.3* 5.9*  ALBUMIN 3.2* 2.7* 2.9* 2.6* 2.2*    Coagulation Profile:  Recent Labs Lab 09/22/16 1636 09/25/16 0327 09/25/16 1425  INR 1.15 1.30 1.18    HbA1C: Hgb A1c MFr Bld  Date/Time Value Ref Range Status  11/26/2015 02:54 AM 6.7 (H) 4.8 - 5.6 % Final    Comment:    (NOTE)         Pre-diabetes: 5.7 - 6.4         Diabetes: >6.4         Glycemic control for adults with diabetes: <7.0     CBG:  Recent Labs Lab 09/24/16 0611 09/24/16 2323 09/27/16 1613 09/27/16 2155 09/28/16 0808  GLUCAP 103* 85 134* 117* 121*    Recent Results (from the past 240 hour(s))  MRSA PCR Screening     Status: None   Collection Time: 09/24/16  2:53 PM  Result Value Ref Range Status   MRSA by PCR NEGATIVE NEGATIVE Final    Comment:        The GeneXpert MRSA Assay (FDA approved for NASAL specimens only), is one component of a comprehensive MRSA colonization surveillance program. It is not intended to diagnose MRSA infection nor to guide or monitor treatment for MRSA infections.   Surgical pcr screen     Status: None   Collection Time: 09/24/16  4:49 PM  Result Value Ref Range Status   MRSA, PCR NEGATIVE NEGATIVE Final   Staphylococcus aureus NEGATIVE NEGATIVE Final    Comment:        The Xpert SA Assay (FDA approved for NASAL specimens in patients over 3 years of age), is one component of a comprehensive surveillance program.  Test performance has been validated by [facility] for patients  greater than or equal to 27 year old. It is not intended to diagnose infection nor to guide or monitor treatment.   Acid Fast Smear (AFB)     Status: None   Collection Time: 09/26/16  9:01 AM  Result Value Ref Range Status   AFB Specimen Processing Concentration  Final   Acid Fast Smear Negative  Final    Comment: (NOTE) Performed At: [facility] [place], [place] [id number] Lindon Romp MD SA:[id number]    Source (AFB) FLUID  Final    Comment: PERICARDIAL PATIENT ON FOLLOWING ZINACEF   Culture, body fluid-bottle     Status: None (Preliminary result)   Collection Time: 09/26/16  9:01 AM  Result Value Ref Range Status   Specimen Description FLUID PERICARDIAL  Final   Special Requests PATIENT ON FOLLOWING ZINACEF  Final   Culture NO GROWTH 1 DAY  Final   Report Status PENDING  Incomplete  Gram stain     Status: None   Collection Time: 09/26/16  9:01 AM  Result Value Ref Range Status   Specimen Description FLUID PERICARDIAL  Final   Special Requests PATIENT ON FOLLOWING  ZINACEF  Final   Gram Stain   Final    RARE WBC PRESENT,BOTH PMN AND MONONUCLEAR NO ORGANISMS SEEN    Report Status 09/26/2016 FINAL  Final  Aerobic/Anaerobic Culture (surgical/deep wound)     Status: None (Preliminary result)   Collection Time: 09/26/16  9:12 AM  Result Value Ref Range Status   Specimen Description TISSUE  Final   Special Requests   Final    PERICARDIUM SPECIMEN C PATIENT ON FOLLOWING ZINACEF   Gram Stain NO WBC SEEN NO ORGANISMS SEEN   Final   Culture NO GROWTH 1 DAY  Final   Report Status PENDING  Incomplete  Acid Fast Smear (AFB)     Status: None   Collection Time: 09/26/16  9:12 AM  Result Value Ref Range Status   AFB Specimen Processing Comment  Final    Comment: Tissue Grinding and Digestion/Decontamination   Acid Fast Smear Negative  Final    Comment: (NOTE) Performed At: [facility] [place], [place] [id number] Lindon Romp MD UR:[id number]    Source (AFB) TISSUE  Final    Comment: PERICARDIUM PATIENT ON FOLLOWING  ZINACEF   Acid Fast Smear (AFB)     Status: None   Collection Time: 09/26/16  9:26 AM  Result Value Ref Range Status   AFB Specimen Processing Concentration  Final   Acid Fast Smear Negative  Final    Comment: (NOTE) Performed At: [facility] [place], [place] [id number] Lindon Romp MD WV:[id number]    Source (AFB) FLUID  Final    Comment: PLEURAL PATIENT ON FOLLOWING ZINACEF   Culture, body fluid-bottle     Status: None (Preliminary result)   Collection Time: 09/26/16  9:26 AM  Result Value Ref Range Status   Specimen Description FLUID PLEURAL  Final   Special Requests PATIENT ON FOLLOWING  ZINACEF  Final   Culture NO GROWTH 1 DAY  Final   Report Status PENDING  Incomplete  Gram stain     Status: None   Collection Time: 09/26/16  9:26 AM  Result Value Ref Range Status   Specimen Description FLUID PLEURAL  Final   Special Requests PATIENT ON FOLLOWING  ZINACEF  Final   Gram Stain NO WBC SEEN NO ORGANISMS SEEN   Final   Report Status 09/26/2016 FINAL  Final     Scheduled Meds: . atorvastatin  10 mg Oral q1800  . diltiazem  120 mg Oral Daily  . DULoxetine  60 mg Oral Daily  . fluticasone  2 spray Each Nare Daily  . folic acid  1 mg Oral Daily  .  insulin aspart  0-5 Units Subcutaneous QHS  . insulin aspart  0-9 Units Subcutaneous TID WC  . levalbuterol  1.25 mg Nebulization TID  . montelukast  10 mg Oral QHS  . pantoprazole  40 mg Oral Daily  . polyethylene glycol  17 g Oral BID  . senna-docusate  1 tablet Oral BID  . vitamin C  1,000 mg Oral Daily   . heparin 1,300 Units/hr (09/28/16 0403)     LOS: 6 days   Cherene Altes, MD [facility] Office  [phone] Pager - Text Page per Amion as per below:  On-Call/Text Page:      [email or web address]      password [email or web address]  If 7PM-7AM, please contact  night-coverage [email or web address] Password [facility] 09/28/2016, 10:44 AM

## 2016-09-28 NOTE — Evaluation (Signed)
Physical Therapy Evaluation Patient Details Name: Carol Buchanan MRN: [id number] DOB: 08-08-1960 Today's Date: 09/28/2016   History of Present Illness  Pt adm with worsening SOB and found to have acute PE. Pt developed pericardial effusion with tamponade and underwent pericardial window and chest tube placement. PMH - lung CA, copd  Clinical Impression  Pt admitted with above diagnosis and presents to PT with functional limitations due to deficits listed below (See PT problem list). Pt needs skilled PT to maximize independence and safety to allow discharge to home with family. Expect pt will make good progress and be able to return to prior living situation.      Follow Up Recommendations No PT follow up    Equipment Recommendations  Other (comment) (To be assessed)    Recommendations for Other Services       Precautions / Restrictions Precautions Precautions: None Restrictions Weight Bearing Restrictions: No      Mobility  Bed Mobility               General bed mobility comments: Pt up in chair  Transfers Overall transfer level: Needs assistance Equipment used: Pushed w/c Transfers: Sit to/from Stand Sit to Stand: Min assist;+2 safety/equipment         General transfer comment: Assist to bring hips up  Ambulation/Gait Ambulation/Gait assistance: Min guard;+2 safety/equipment Ambulation Distance (Feet): 150 Feet Assistive device: Pushed wheelchair Gait Pattern/deviations: Step-through pattern;Decreased stride length Gait velocity: decr Gait velocity interpretation: Below normal speed for age/gender General Gait Details: Assist for balance and support. SpO2 >95%. HR 120's with gait and 110's at rest.  Stairs            Wheelchair Mobility    Modified Rankin (Stroke Patients Only)       Balance Overall balance assessment: Needs assistance Sitting-balance support: No upper extremity supported;Feet supported Sitting balance-Leahy Scale:  Good     Standing balance support: No upper extremity supported;During functional activity Standing balance-Leahy Scale: Fair                               Pertinent Vitals/Pain      Home Living Family/patient expects to be discharged to:: Private residence Living Arrangements: Parent Available Help at Discharge: Family Type of Home: House Home Access: Stairs to enter   Technical brewer of Steps: 1 Home Layout: One level Home Equipment: None      Prior Function Level of Independence: Independent               Hand Dominance        Extremity/Trunk Assessment   Upper Extremity Assessment Upper Extremity Assessment: Defer to OT evaluation    Lower Extremity Assessment Lower Extremity Assessment: Generalized weakness       Communication   Communication: No difficulties  Cognition Arousal/Alertness: Awake/alert Behavior During Therapy: WFL for tasks assessed/performed Overall Cognitive Status: Within Functional Limits for tasks assessed                      General Comments      Exercises     Assessment/Plan    PT Assessment Patient needs continued PT services  PT Problem List Decreased strength;Decreased activity tolerance;Decreased balance;Decreased mobility       PT Treatment Interventions DME instruction;Gait training;Functional mobility training;Therapeutic activities;Therapeutic exercise;Balance training;Patient/family education    PT Goals (Current goals can be found in the Care Plan section)  Acute Rehab PT Goals  Patient Stated Goal: return home PT Goal Formulation: With patient Time For Goal Achievement: 10/05/16 Potential to Achieve Goals: Good    Frequency Min 3X/week   Barriers to discharge        Co-evaluation               End of Session Equipment Utilized During Treatment: Oxygen Activity Tolerance: Patient tolerated treatment well Patient left: in chair;with call bell/phone within  reach Nurse Communication: Mobility status PT Visit Diagnosis: Difficulty in walking, not elsewhere classified (R26.2)         Time: 1552-0802 PT Time Calculation (min) (ACUTE ONLY): 14 min   Charges:   PT Evaluation $PT Eval Moderate Complexity: 1 Procedure     PT G CodesShary Decamp Maycok 2016/10/24, 6:02 PM [facility] PT [phone]

## 2016-09-28 NOTE — Progress Notes (Signed)
[name] is looking better. Her breathing is doing better. She had her surgery for the pericardial fluid on Saturday. She got through this well. I'm not sure how much fluid was taken out from the pericardial sac. Cytology was sent. Biopsies were sent of the pericardium. She still has 2 chest tubes in. Hopefully these will be removed today.  She says her breathing is feeling better. She is on a heparin infusion. I'll keep her on this for right now until we know that she has no further invasive procedures to be done.  She's had no fever. She's had a little nausea but no vomiting.  Her labs look pretty good today. Her hemoglobin is 9. Platelet count 228,000. White cell count is 9000. Her potassium is 3.2. Creatinine 1.22.  Her lungs are physical exam show relatively decent breath sounds bilaterally. She mentioned some slight decrease at the bases. Cardiac exam regular rate and rhythm. Thankfully, her heart rate is not tachycardic. Abdomen is soft. She has good bowel sounds. Extremities shows no clubbing, cyanosis or edema. Neurological exam is non-focal.  [name] has metastatic lung cancer. It will be interesting to see what the cytology and biopsies show.  Hopefully, the chest tubes will be removed today. And hopefully, she will be more active.  I very much appreciate everybody's care for her. As always, she is gotten incredibly outstanding and compassionate care from everybody in the ICU up on 4 [address].  Lattie Haw, MD  Acts 3:16

## 2016-09-29 ENCOUNTER — Ambulatory Visit (HOSPITAL_COMMUNITY): Payer: BLUE CROSS/BLUE SHIELD

## 2016-09-29 DIAGNOSIS — R1084 Generalized abdominal pain: Secondary | ICD-10-CM

## 2016-09-29 DIAGNOSIS — Z419 Encounter for procedure for purposes other than remedying health state, unspecified: Secondary | ICD-10-CM

## 2016-09-29 LAB — CBC
HCT: 26.8 % — ABNORMAL LOW (ref 36.0–46.0)
Hemoglobin: 8.6 g/dL — ABNORMAL LOW (ref 12.0–15.0)
MCH: 33.7 pg (ref 26.0–34.0)
MCHC: 32.1 g/dL (ref 30.0–36.0)
MCV: 105.1 fL — ABNORMAL HIGH (ref 78.0–100.0)
PLATELETS: 267 10*3/uL (ref 150–400)
RBC: 2.55 MIL/uL — AB (ref 3.87–5.11)
RDW: 18.9 % — AB (ref 11.5–15.5)
WBC: 9.8 10*3/uL (ref 4.0–10.5)

## 2016-09-29 LAB — BASIC METABOLIC PANEL
Anion gap: 9 (ref 5–15)
BUN: 16 mg/dL (ref 6–20)
CALCIUM: 8.4 mg/dL — AB (ref 8.9–10.3)
CO2: 27 mmol/L (ref 22–32)
CREATININE: 1.22 mg/dL — AB (ref 0.44–1.00)
Chloride: 102 mmol/L (ref 101–111)
GFR, EST AFRICAN AMERICAN: 56 mL/min — AB (ref >60)
GFR, EST NON AFRICAN AMERICAN: 49 mL/min — AB (ref >60)
Glucose, Bld: 120 mg/dL — ABNORMAL HIGH (ref 65–99)
Potassium: 4.5 mmol/L (ref 3.5–5.1)
SODIUM: 138 mmol/L (ref 135–145)

## 2016-09-29 LAB — MAGNESIUM: MAGNESIUM: 1.6 mg/dL — AB (ref 1.7–2.4)

## 2016-09-29 LAB — HEPARIN LEVEL (UNFRACTIONATED)
HEPARIN UNFRACTIONATED: 0.33 [IU]/mL (ref 0.30–0.70)
HEPARIN UNFRACTIONATED: 0.36 [IU]/mL (ref 0.30–0.70)

## 2016-09-29 LAB — GLUCOSE, CAPILLARY
Glucose-Capillary: 129 mg/dL — ABNORMAL HIGH (ref 65–99)
Glucose-Capillary: 105 mg/dL — ABNORMAL HIGH (ref 65–99)
Glucose-Capillary: 121 mg/dL — ABNORMAL HIGH (ref 65–99)

## 2016-09-29 MED ORDER — ENSURE ENLIVE PO LIQD
237.0000 mL | Freq: Two times a day (BID) | ORAL | Status: DC
  Administered 2016-09-29 – 2016-10-02 (×2): 237 mL via ORAL

## 2016-09-29 MED ORDER — WARFARIN - PHARMACIST DOSING INPATIENT
Freq: Every day | Status: DC
  Administered 2016-09-29 – 2016-09-30 (×2)

## 2016-09-29 MED ORDER — CHLORHEXIDINE GLUCONATE CLOTH 2 % EX PADS
6.0000 | MEDICATED_PAD | Freq: Every day | CUTANEOUS | Status: DC
  Administered 2016-09-29 – 2016-09-30 (×2): 6 via TOPICAL

## 2016-09-29 MED ORDER — WARFARIN SODIUM 5 MG PO TABS
5.0000 mg | ORAL_TABLET | Freq: Once | ORAL | Status: AC
  Administered 2016-09-29: 5 mg via ORAL
  Filled 2016-09-29: qty 1

## 2016-09-29 MED ORDER — METOPROLOL TARTRATE 12.5 MG HALF TABLET
12.5000 mg | ORAL_TABLET | Freq: Two times a day (BID) | ORAL | Status: DC
  Administered 2016-09-29 (×2): 12.5 mg via ORAL
  Filled 2016-09-29 (×2): qty 1

## 2016-09-29 MED ORDER — SODIUM CHLORIDE 0.9 % IV SOLN
INTRAVENOUS | Status: DC | PRN
  Administered 2016-09-29: 1000 mL via INTRAVENOUS

## 2016-09-29 NOTE — Progress Notes (Signed)
ANTICOAGULATION CONSULT NOTE - Follow Up Consult  Pharmacy Consult for Heparin  Indication: pulmonary embolus  Allergies  Allergen Reactions  . No Known Allergies     Patient Measurements: Height: '5\' 4"'$  (162.6 cm) Weight: 188 lb 4.4 oz (85.4 kg) IBW/kg (Calculated) : 54.7  Vital Signs: Temp: 99 F (37.2 C) (03/05 2300) Temp Source: Oral (03/05 2300) BP: 116/69 (03/05 2300) Pulse Rate: 109 (03/05 2300)  Labs:  Recent Labs  09/27/16 0018  09/28/16 0209 09/28/16 1232 09/28/16 2032 09/29/16 0319  HGB 9.1*  --  9.0*  --   --  8.6*  HCT 27.8*  --  27.8*  --   --  26.8*  PLT 195  --  228  --   --  267  HEPARINUNFRC 0.15*  < > 0.13* 0.22* 0.21* 0.33  CREATININE 1.36*  --  1.22*  --   --   --   < > = values in this interval not displayed.  Estimated Creatinine Clearance: 54.5 mL/min (by C-G formula based on SCr of 1.22 mg/dL (H)).  Assessment: 5 y/oF with PMH of lung cancer, anemia (s/p blood transfusion last week), COPD, DM, HLD, HTN who presented to [facility] ED on 09/22/16 with worsening shortness of breath. CTA of chest + for PE and heparin started. She was transitioned to Xarelto on 2/28 and subsequently back to heparin that same day anticipating pericardial window  Pt now s/p pericardial window on 3/4, goal heparin level per Dr Prescott Gum is ~0.3.   Goal of Therapy:  Heparin level ~0.3 units/ml Monitor platelets by anticoagulation protocol: Yes   Plan:  -Cont heparin at 1600 units/hr -1200 HL  Caresse Sedivy 09/29/2016,3:59 AM

## 2016-09-29 NOTE — Progress Notes (Signed)
Progress Note  Patient Name: Carol Buchanan Date of Encounter: 09/29/2016  Primary Cardiologist: Dr. Acie Fredrickson  Subjective   56 y.o. 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  Was treated with XRT initially and has been on chemoRx since then, f/b Dr Marin Olp.  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  Presenting with one month hx worsening SOB.  CT angio of chest in ED today showed bilat segmental and subsegmental pulm emboli, no R heart strain, mild CHF, pericardial effusion  She had a pericardial window on 09/26/16.    Patient is feeling well; denies chest pain, SOB, and palpitations. She is sitting up in the chair this morning eating breakfast.     Inpatient Medications    Scheduled Meds: . atorvastatin  10 mg Oral q1800  . Chlorhexidine Gluconate Cloth  6 each Topical Q0600  . diltiazem  120 mg Oral Daily  . DULoxetine  60 mg Oral Daily  . fluticasone  2 spray Each Nare Daily  . folic acid  1 mg Oral Daily  . insulin aspart  0-5 Units Subcutaneous QHS  . insulin aspart  0-9 Units Subcutaneous TID WC  . levalbuterol  1.25 mg Nebulization TID  . montelukast  10 mg Oral QHS  . pantoprazole  40 mg Oral Daily  . polyethylene glycol  17 g Oral BID  . senna-docusate  1 tablet Oral BID  . vitamin C  1,000 mg Oral Daily   Continuous Infusions: . heparin 1,600 Units/hr (09/29/16 0459)   PRN Meds: sodium chloride, feeding supplement (ENSURE ENLIVE), HYDROcodone-acetaminophen, levalbuterol, LORazepam   Vital Signs    Vitals:   09/28/16 2300 09/29/16 0454 09/29/16 0700 09/29/16 0747  BP: 116/69 122/74 113/80 120/72  Pulse: (!) 109 (!) 104 (!) 104 (!) 106  Resp: (!) '23 18 20 '$ (!) 23  Temp: 99 F (37.2 C) 97.7 F (36.5 C)  98.5 F (36.9 C)  TempSrc: Oral Oral  Axillary  SpO2: 99% 100% 100% 100%  Weight:  182 lb 8.7 oz (82.8 kg)    Height:        Intake/Output Summary (Last 24 hours) at 09/29/16 0913 Last data filed at 09/29/16 0800  Gross per 24 hour    Intake          1379.73 ml  Output              673 ml  Net           706.73 ml   Filed Weights   09/27/16 0500 09/28/16 0500 09/29/16 0454  Weight: 186 lb 15.2 oz (84.8 kg) 188 lb 4.4 oz (85.4 kg) 182 lb 8.7 oz (82.8 kg)     Physical Exam   General: Well developed, well nourished, female appearing in no acute distress. Head: Normocephalic, atraumatic.  Neck: Supple without bruits, JVD Lungs:  Resp regular and unlabored, CTA. Left pleural drain in place with dressing Heart: Regular rhythm, tachycardic rate, S1, S2, no murmur; no rub, pericardial drain in place with dressing Abdomen: Soft, non-tender, non-distended with normoactive bowel sounds. No hepatomegaly. No rebound/guarding. No obvious abdominal masses. Extremities: No clubbing, cyanosis, No edema. Distal pedal pulses are 2+ bilaterally. Neuro: Alert and oriented X 3. Moves all extremities spontaneously. Psych: Normal affect.  Labs    Chemistry  Recent Labs Lab 09/25/16 1425 09/26/16 0219 09/27/16 0018 09/28/16 0209 09/29/16 0319  NA 135 136 137 136 138  K 3.4* 3.1* 3.4* 3.2* 4.5  CL  93* 94* 94* 97* 102  CO2 '27 30 30 29 27  '$ GLUCOSE 138* 137* 110* 135* 120*  BUN '15 14 10 9 16  '$ CREATININE 1.40* 1.31* 1.36* 1.22* 1.22*  CALCIUM 8.9 8.7* 8.4* 8.1* 8.4*  PROT 6.9 6.3*  --  5.9*  --   ALBUMIN 2.9* 2.6*  --  2.2*  --   AST 36 34  --  22  --   ALT 34 32  --  23  --   ALKPHOS 145* 143*  --  128*  --   BILITOT 0.5 0.4  --  0.8  --   GFRNONAA 41* 45* 43* 49* 49*  GFRAA 48* 52* 49* 56* 56*  ANIONGAP '15 12 13 10 9     '$ Hematology  Recent Labs Lab 09/27/16 0018 09/28/16 0209 09/29/16 0319  WBC 6.1 9.0 9.8  RBC 2.71* 2.71* 2.55*  HGB 9.1* 9.0* 8.6*  HCT 27.8* 27.8* 26.8*  MCV 102.6* 102.6* 105.1*  MCH 33.6 33.2 33.7  MCHC 32.7 32.4 32.1  RDW 17.7* 18.2* 18.9*  PLT 195 228 267    Cardiac EnzymesNo results for input(s): TROPONINI in the last 168 hours.   Recent Labs Lab 09/22/16 1646  TROPIPOC 0.00      BNP  Recent Labs Lab 09/22/16 1636  BNP 72.8     DDimer No results for input(s): DDIMER in the last 168 hours.   Radiology    Dg Chest Port 1 View  Result Date: 09/28/2016 CLINICAL DATA:  Pericardial effusion. Left pleural effusion. Recent pulmonary emboli. EXAM: PORTABLE CHEST 1 VIEW COMPARISON:  Chest x-rays dated 09/27/2016 and 09/26/2016 and chest CT dated 09/22/2016 FINDINGS: Power port and left chest tube remain in good position. Small loculated left effusion persists. Left perihilar scarring from previous radiation therapy for non-small cell carcinoma of the lung. The right lung is clear.  Pulmonary vascularity is normal. IMPRESSION: No significant change since the prior study. Small amount of residual loculated fluid on the left superimposed on chronic scarring from prior radiation therapy. Electronically Signed   By: Lorriane Shire M.D.   On: 09/28/2016 07:22     Telemetry    Sinus tachycardia 100-110s - Personally Reviewed  ECG     09/25/16: Sinus tachycardia, 101 bpm  - Personally Reviewed   Cardiac Studies   TEE 09/26/16:  Left ventricle: Normal cavity size, wall thickness, left ventricular diastolic function and left atrial pressure. LV systolic function is low normal with an EF of 50-55%.  Aortic valve: No stenosis. Trace regurgitation.  Mitral valve: Trace regurgitation.  Right ventricle: Normal cavity size, wall thickness and ejection fraction. Mildly compressed by pericardial fluid. This is notably improved after pericardial window procedure.  Tricuspid valve: Mild to moderate regurgitation.  Pericardium: Circumferential pericardial effusion. Effusion size is 159m.     Patient Profile     56y.o. female with lung CA, pericardial effusion, pleural effusion, and PE who presented with chest pain.   Assessment & Plan    1. Pericardial effusion - s/p pericardial window on 3/3  - mediastinal drain with 32 cc/24 hr - pt states chest pain is  better Will likely have the pericardial drain pulled later today    2. Pleural effusion - s/p CT placement   3. Sinus tachycardia No wheezing on exam today  Will start metoprolol 12.5 PO BID    3. HTN - pt is tolerating PO cardizem; BP 110-120 / 60-70s - HR continues in the 100-110s, likely secondary to  PE and hypoxia - hold home verapamil    4. AKI - sCr 1.22 (1.36) with good urine output - diuresed 2.3 L yesterday   5. Hypokalemia - K 3.2 (3.4); replace per primary team to keep K closer to [place], MD  09/29/2016 9:23 [place],  [place], [place]  [address] Pager [phone] Phone: [phone]; Fax: [phone]

## 2016-09-29 NOTE — Progress Notes (Signed)
Nutrition Follow-up  DOCUMENTATION CODES:   Not applicable  INTERVENTION:  -Ensure Enlive BID between meals. Each supplement provides 350 kcal and 20 grams of protein - Continue to encourage PO intakes of meals.    NUTRITION DIAGNOSIS:   Increased nutrient needs related to catabolic illness, cancer and cancer related treatments as evidenced by estimated needs.  Ongoing  GOAL:   Patient will meet greater than or equal to 90% of their needs  Met  MONITOR:   PO intake, Weight trends, Labs, I & O's  REASON FOR ASSESSMENT:   Malnutrition Screening Tool    ASSESSMENT:   56 y.o. 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  Was treated with XRT initially and has been on chemoRx since then, f/b Dr Marin Olp.  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  Presenting with one month hx worsening SOB.  CT angio of chest in ED today showed bilat segmental and subsegmental pulm emboli, no R heart strain, mild CHF, pericardial effusion.  CXR shows chronic scarring left chest, L effusion.  Patient s/p pericardial window on 3/3. Mediastinal drain with 32 cc/24 hours. Per MD note, pericardial drain will likely be pulled out later today (3/6).  Patient was sitting up in chair, getting ready for a shower. Nurse allowed me to perform nutrition follow up. Per patient report and patient chart PO intake has been nearly 100% at every meal. Patient reported no current nausea, vomiting, or diarrhea. Patient breakfast intake was nearly 100% Patient consumed eggs, [place] sausage, and orange slices. (Patient consumed everything except for dry toast which she does not like).   Patient reported continued taste changes and texture issues with food due to chemotherapy. Patient reported no specific foods present thess issues, just all foods in general seem to taste more bland than usual.   Patient reported no known weight loss. Per patient chart, weight has been trending up since 3/1. Patient had 8 lb weight  gain from 3/1 to 3/5. Per chart, weight decreased from 188 yesterday (3/5) to 182 lb today (3/5). This appears to be an outlier or inadequate reading.  Patient reported she never received Ensure, but has tried Ensure in the past and really likes the chocolate supplement. Patient requested Ensure Enlive to supplement current intake with extra protein and calories.  Labs Reviewed: CBG 129, Creatinine 1.22, Calcium 8.4, Magnesium 1.6  Meds Reviewed: Folvite, Novolog, Lopressor, Miralax, Senokot, Vitamin C  Diet Order:  Diet Heart Room service appropriate? Yes; Fluid consistency: Thin  Skin:  Reviewed, no issues  Last BM:  2/27  Height:   Ht Readings from Last 1 Encounters:  09/24/16 _0  (1.626 m)    Weight:   Wt Readings from Last 1 Encounters:  09/29/16 182 lb 8.7 oz (82.8 kg)    Ideal Body Weight:  54.54 kg  BMI:  Body mass index is 31.33 kg/m.  Estimated Nutritional Needs:   Kcal:  2100-2345 (25-28 kcal/kg)  Protein:  100-117 grams (1.2-1.4 grams/kg)  Fluid:  >/= 2. L/day  EDUCATION NEEDS:   No education needs identified at this time  Juliann Pulse M.S. Nutrition Dietetic Intern

## 2016-09-29 NOTE — Evaluation (Signed)
Occupational Therapy Evaluation Patient Details Name: Carol Buchanan MRN: [id number] DOB: Feb 13, 1961 Today's Date: 09/29/2016    History of Present Illness Pt adm with worsening SOB and found to have acute PE. Pt developed pericardial effusion with tamponade and underwent pericardial window and chest tube placement. PMH - lung CA, copd   Clinical Impression   Pt fatigued easily, but could perform ADL and IADL independently prior to admission. Pt presents with decreased activity tolerance and generalized weakness requiring min guard assist for ADL and mobility. Educated and gave pt handout on energy conservation and instructed in pursed lip breathing. Pt is on 4L 02 at home and currently. Will follow acutely.    Follow Up Recommendations  No OT follow up    Equipment Recommendations  None recommended by OT    Recommendations for Other Services       Precautions / Restrictions Precautions Precautions: None Restrictions Weight Bearing Restrictions: No      Mobility Bed Mobility               General bed mobility comments: Pt up in chair  Transfers Overall transfer level: Needs assistance Equipment used: Rolling walker (2 wheeled) Transfers: Sit to/from Stand Sit to Stand: Min guard         General transfer comment: Incr time and effort to rise    Balance Overall balance assessment: Needs assistance Sitting-balance support: No upper extremity supported;Feet supported Sitting balance-Leahy Scale: Good     Standing balance support: No upper extremity supported;During functional activity Standing balance-Leahy Scale: Fair                              ADL Overall ADL's : Needs assistance/impaired Eating/Feeding: Independent;Sitting   Grooming: Standing;Wash/dry hands;Min guard   Upper Body Bathing: Set up;Sitting   Lower Body Bathing: Sit to/from stand;Min guard   Upper Body Dressing : Set up;Sitting   Lower Body Dressing: Sit to/from  stand;Min guard   Toilet Transfer: Ambulation;RW;BSC;Min guard   Toileting- Water quality scientist and Hygiene: Sit to/from stand;Min guard       Functional mobility during ADLs: Min guard;Rolling walker General ADL Comments: Educated pt in energy conservation and reinforced with handout. Encouraged pt to use her shower seat. Pt able to cross her foot over opposite knee with ease.      Vision Baseline Vision/History: Wears glasses Wears Glasses: Reading only Patient Visual Report: No change from baseline       Perception     Praxis      Pertinent Vitals/Pain Pain Assessment: No/denies pain     Hand Dominance Right   Extremity/Trunk Assessment Upper Extremity Assessment Upper Extremity Assessment: Overall WFL for tasks assessed   Lower Extremity Assessment Lower Extremity Assessment: Defer to PT evaluation       Communication Communication Communication: No difficulties   Cognition Arousal/Alertness: Awake/alert Behavior During Therapy: WFL for tasks assessed/performed Overall Cognitive Status: Within Functional Limits for tasks assessed                     General Comments       Exercises       Shoulder Instructions      Home Living Family/patient expects to be discharged to:: Private residence Living Arrangements: Parent Available Help at Discharge: Family;Available 24 hours/day Type of Home: House Home Access: Stairs to enter [employer] of Steps: 1   Home Layout: One level     Bathroom  Shower/Tub: Teacher, early years/pre: Standard     Home Equipment: Building services engineer Comments: mother is 22 and in good health      Prior Functioning/Environment Level of Independence: Independent                 OT Problem List: Decreased activity tolerance;Impaired balance (sitting and/or standing);Decreased knowledge of use of DME or AE;Cardiopulmonary status limiting activity      OT Treatment/Interventions:  Self-care/ADL training;DME and/or AE instruction;Patient/family education;Therapeutic activities;Energy conservation    OT Goals(Current goals can be found in the care plan section) Acute Rehab OT Goals Patient Stated Goal: return home OT Goal Formulation: With patient Time For Goal Achievement: 10/06/16 Potential to Achieve Goals: Good ADL Goals Pt Will Perform Grooming: with modified independence;standing (2 activities) Pt Will Perform Lower Body Bathing: with modified independence;sit to/from stand Pt Will Perform Lower Body Dressing: with modified independence;sit to/from stand Pt Will Transfer to Toilet: with modified independence;ambulating;regular height toilet Additional ADL Goal #1: Pt will state at least 3 energy conservation strategies as instructed. Additional ADL Goal #2: Pt will gather items necessary for ADL modified independently.  OT Frequency: Min 2X/week   Barriers to D/C:            Co-evaluation              End of Session Equipment Utilized During Treatment: Gait belt;Rolling walker;Oxygen (4L)  Activity Tolerance: Patient tolerated treatment well Patient left: in chair;with call bell/phone within reach;with nursing/sitter in room  OT Visit Diagnosis: Unsteadiness on feet (R26.81)                ADL either performed or assessed with clinical judgement  Time: 1420-1450 OT Time Calculation (min): 30 min Charges:  OT General Charges $OT Visit: 1 Procedure OT Evaluation $OT Eval Moderate Complexity: 1 Procedure OT Treatments $Self Care/Home Management : 8-22 mins G-Codes:     [name] 09/29/2016, 3:01 PM  [phone]

## 2016-09-29 NOTE — Progress Notes (Addendum)
ANTICOAGULATION CONSULT NOTE - Follow Up Consult  Pharmacy Consult for warfarin/heparin Indication: pulmonary embolus  Allergies  Allergen Reactions  . No Known Allergies     Patient Measurements: Height: '5\' 4"'$  (162.6 cm) Weight: 182 lb 8.7 oz (82.8 kg) IBW/kg (Calculated) : 54.7 Heparin Dosing Weight: 72.4kg  Vital Signs: Temp: 98.5 F (36.9 C) (03/06 1100) Temp Source: Oral (03/06 1100) BP: 126/77 (03/06 1200) Pulse Rate: 108 (03/06 1200)  Labs:  Recent Labs  09/27/16 0018  09/28/16 0209  09/28/16 2032 09/29/16 0319 09/29/16 1221  HGB 9.1*  --  9.0*  --   --  8.6*  --   HCT 27.8*  --  27.8*  --   --  26.8*  --   PLT 195  --  228  --   --  267  --   HEPARINUNFRC 0.15*  < > 0.13*  < > 0.21* 0.33 0.36  CREATININE 1.36*  --  1.22*  --   --  1.22*  --   < > = values in this interval not displayed.  Estimated Creatinine Clearance: 53.6 mL/min (by C-G formula based on SCr of 1.22 mg/dL (H)).   Assessment: 46 yoF on currently on heparin for bilateral PE. She is not s/p pericardial window and chest tube removal. Pharmacy consulted to start heparin. Last heparin level slightly below goal set by CVTS. CBC stable. No bleeding per nurse. Due to close proximity with pericardial window and drain removal, will initiate warfarin conservatively. Continue heparin until INR >2 for at least 24 hours.   Goal of Therapy:  INR 2-3 Heparin Level 0.4-0.6 per CVTS Monitor platelets by anticoagulation protocol: Yes   Plan:  Give warfarin '5mg'$  x1 tonight Increase heparin to 1650 units/hr Daily heparin level, CBC, and INR Monitor signs/symptoms of bleeding   Dierdre Harness, [place], PharmD Clinical Pharmacy Resident [phone] (Pager) 09/29/2016 1:29 PM

## 2016-09-29 NOTE — Progress Notes (Signed)
[facility] TEAM 1 - Stepdown/ICU TEAM  Carol Buchanan  VOH:[id number] DOB: 06-14-1961 DOA: 09/22/2016 PCP: Andria Frames, MD    Brief Narrative:  56 y.o. female with history of [place] [facility] CA dx'd in Nov 2016. Was treated with XRT initially and has been on chemoRx since then per Dr Marin Olp. Has had mets to rib and spine. Presented with one month hx worsening SOB. CT angio of chest in ED noted bilat segmental and subsegmental pulm emboli w/ no R heart strain. Patient had her left chest tube removed on March 6. Also had a subxiphoid pericardial tube.  Subjective: She states she's feeling much better now. She does report of mild bilateral lower extreme E swelling but no pain and shortness of breath. She has been working with physical therapy and walked across the room yesterday with minimal shortness of breath and reports it has improved from before.   Assessment & Plan:  Acute Pulmonary embolism Continue IV heparin and transition to oral Coumadin. Pharmacy consult for Coumadin dosing placed  Pericardial effusion w/ tamponade - Pleural effusion  TTE noted physiologic tamponade - s/p pericardial window/drain and chest tube - tube management removed on 09/29/2016  per [facility] Pathology report from pericardial tissue fluid still pending. Repeat chest x-ray tomorrow a.m.   Acute kidney injury on chronic kidney disease stage III Baseline creatinine appears to be approximately 1.4 - avoid diuresis, contrast, ACEi/ARB - renal fxn stable at baseline Her renal function remained stable. 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 Consultants:  [facility] PCCM Cardiology  Procedures: 3/1 TTE - EF 55-60 percent with no regional wall motion abnormalities with large pericardial effusion and features suggestive of tamponade   2/28 bilateral lower extremity venous duplex without evidence of DVT  3/3 subxyphoid pericardial window - L chest tube   3/6 chest tube and pericardial tube removed.    Antimicrobials:  none  Objective: Blood pressure 126/77, pulse (!) 108, temperature 98.5 F (36.9 C), temperature source Oral, resp. rate (!) 25, height '5\' 4"'$  (1.626 m), weight 82.8 kg (182 lb 8.7 oz), SpO2 97 %.  Intake/Output Summary (Last 24 hours) at 09/29/16 1308 Last data filed at 09/29/16 1200  Gross per 24 hour  Intake          1504.73 ml  Output              593 ml  Net  911.73 ml   Filed Weights   09/27/16 0500 09/28/16 0500 09/29/16 0454  Weight: 84.8 kg (186 lb 15.2 oz) 85.4 kg (188 lb 4.4 oz) 82.8 kg (182 lb 8.7 oz)    Examination: General: No acute respiratory distress - alert and pleasant  Lungs: Clear to auscultation bilaterally w/o wheezing  Cardiovascular: Regular rate and rhythm . Chest tube and pericardial tube dressing in place without any evidence of bleeding at this time.  Abdomen: Nondistended, soft, bs+  Extremities: no signif  B LE edema   CBC:  Recent Labs Lab 09/22/16 1636 09/23/16 0201  09/25/16 1425 09/26/16 0219 09/27/16 0018 09/28/16 0209 09/29/16 0319  WBC 1.6* 1.5*  < > 3.3* 2.8* 6.1 9.0 9.8  NEUTROABS 0.8* 0.7*  --   --   --   --   --   --   HGB 8.0* 8.1*  < > 8.6* 7.8* 9.1* 9.0* 8.6*  HCT 23.8* 23.8*  < > 26.4* 24.4* 27.8* 27.8* 26.8*  MCV 97.9 100.0  < > 103.1* 103.0* 102.6* 102.6* 105.1*  PLT 134* 127*  < > 170 149* 195 228 267  < > = values in this interval not displayed. Basic Metabolic Panel:  Recent Labs Lab 09/25/16 1425 09/26/16 0219 09/27/16 0018 09/28/16 0209 09/29/16 0319  NA 135 136 137 136 138  K 3.4* 3.1* 3.4* 3.2* 4.5  CL 93* 94* 94* 97* 102  CO2 '27 30 30 29 27  '$ GLUCOSE 138* 137* 110* 135* 120*  BUN '15 14 10 9 16  '$ CREATININE 1.40* 1.31* 1.36* 1.22* 1.22*  CALCIUM 8.9 8.7* 8.4* 8.1* 8.4*  MG  --   --   --   --  1.6*   GFR: Estimated Creatinine Clearance: 53.6 mL/min (by C-G formula based on SCr of 1.22 mg/dL (H)).  Liver Function Tests:  Recent Labs Lab 09/22/16 1636 09/25/16 0327 09/25/16 1425 09/26/16 0219 09/28/16 0209  AST 40 31 36 34 22  ALT 41 32 34 32 23  ALKPHOS 157* 142* 145* 143* 128*  BILITOT 0.5 0.9 0.5 0.4 0.8  PROT 7.2 6.8 6.9 6.3* 5.9*  ALBUMIN 3.2* 2.7* 2.9* 2.6* 2.2*    Coagulation Profile:  Recent Labs Lab 09/22/16 1636 09/25/16 0327 09/25/16 1425  INR 1.15 1.30 1.18    HbA1C: Hgb A1c MFr Bld  Date/Time Value Ref Range Status  11/26/2015 02:54 AM 6.7 (H) 4.8 - 5.6 % Final    Comment:    (NOTE)         Pre-diabetes: 5.7 - 6.4         Diabetes: >6.4         Glycemic control for adults with diabetes: <7.0     CBG:  Recent Labs Lab 09/28/16 1150 09/28/16 1619 09/28/16 2103 09/29/16 0755 09/29/16 1125  GLUCAP 122* 135* 138* 129* 105*    Recent Results (from the past 240 hour(s))  MRSA PCR Screening     Status: None   Collection Time: 09/24/16  2:53 PM  Result Value Ref Range Status   MRSA by PCR NEGATIVE  NEGATIVE Final    Comment:        The GeneXpert MRSA Assay (FDA approved for NASAL specimens only), is one component of a comprehensive MRSA colonization surveillance program. It is not intended to diagnose MRSA infection nor to guide or monitor treatment for MRSA infections.   Surgical pcr screen     Status: None   Collection Time: 09/24/16  4:49 PM  Result Value  Ref Range Status   MRSA, PCR NEGATIVE NEGATIVE Final   Staphylococcus aureus NEGATIVE NEGATIVE Final    Comment:        The Xpert SA Assay (FDA approved for NASAL specimens in patients over 46 years of age), is one component of a comprehensive surveillance program.  Test performance has been validated by [facility] for patients greater than or equal to 17 year old. It is not intended to diagnose infection nor to guide or monitor treatment.   Fungus Culture With Stain     Status: None (Preliminary result)   Collection Time: 09/26/16  9:01 AM  Result Value Ref Range Status   Fungus Stain Final report  Final    Comment: (NOTE) Performed At: [facility] [place], [place] [id number] Lindon Romp MD [id number]    Fungus (Mycology) Culture PENDING  Incomplete   Fungal Source FLUID  Final    Comment: PERICARDIAL PATIENT ON FOLLOWING  ZINACEF   Acid Fast Smear (AFB)     Status: None   Collection Time: 09/26/16  9:01 AM  Result Value Ref Range Status   AFB Specimen Processing Concentration  Final   Acid Fast Smear Negative  Final    Comment: (NOTE) Performed At: [facility] [place], [place] [id number] Lindon Romp MD [id number]    Source (AFB) FLUID  Final    Comment: PERICARDIAL PATIENT ON FOLLOWING ZINACEF   Culture, body fluid-bottle     Status: None (Preliminary result)   Collection Time: 09/26/16  9:01 AM  Result Value Ref Range Status   Specimen Description FLUID PERICARDIAL  Final   Special Requests PATIENT ON FOLLOWING ZINACEF  Final     Culture NO GROWTH 2 DAYS  Final   Report Status PENDING  Incomplete  Gram stain     Status: None   Collection Time: 09/26/16  9:01 AM  Result Value Ref Range Status   Specimen Description FLUID PERICARDIAL  Final   Special Requests PATIENT ON FOLLOWING  ZINACEF  Final   Gram Stain   Final    RARE WBC PRESENT,BOTH PMN AND MONONUCLEAR NO ORGANISMS SEEN    Report Status 09/26/2016 FINAL  Final  Fungus Culture Result     Status: None   Collection Time: 09/26/16  9:01 AM  Result Value Ref Range Status   Result 1 Comment  Final    Comment: (NOTE) KOH/Calcofluor preparation:  no fungus observed. Performed At: [facility] [place], [place] [id number] Lindon Romp MD [id number]   Fungus Culture With Stain     Status: None (Preliminary result)   Collection Time: 09/26/16  9:12 AM  Result Value Ref Range Status   Fungus Stain Final report  Final    Comment: (NOTE) Performed At: [facility] [place], [place] [id number] Lindon Romp MD [id number]    Fungus (Mycology) Culture PENDING  Incomplete   Fungal Source TISSUE  Final    Comment: PERICARDIUM PATIENT ON FOLLOWING ZINACEF   Aerobic/Anaerobic Culture (surgical/deep wound)     Status: None (Preliminary result)   Collection Time: 09/26/16  9:12 AM  Result Value Ref Range Status   Specimen Description TISSUE  Final   Special Requests   Final    PERICARDIUM SPECIMEN C PATIENT ON FOLLOWING ZINACEF   Gram Stain NO WBC SEEN NO ORGANISMS SEEN   Final   Culture   Final    NO GROWTH 2 DAYS NO ANAEROBES  ISOLATED; CULTURE IN PROGRESS FOR 5 DAYS   Report Status PENDING  Incomplete  Acid Fast Smear (AFB)     Status: None   Collection Time: 09/26/16  9:12 AM  Result Value Ref Range Status   AFB Specimen Processing Comment  Final    Comment: Tissue Grinding and Digestion/Decontamination   Acid Fast Smear Negative  Final    Comment: (NOTE) Performed At: [facility] [place], [place] [id number] Lindon Romp MD [id number]    Source (AFB) TISSUE  Final    Comment: PERICARDIUM PATIENT ON FOLLOWING  ZINACEF   Fungus Culture Result     Status: None   Collection Time: 09/26/16  9:12 AM  Result Value Ref Range Status   Result 1 Comment  Final    Comment: (NOTE) KOH/Calcofluor preparation:  no fungus observed. Performed At: [facility] [place], [place] [id number] Lindon Romp MD [id number]:[id number]   Fungus Culture With Stain     Status: None (Preliminary result)   Collection Time: 09/26/16  9:26 AM  Result Value Ref Range Status   Fungus Stain Final report  Final    Comment: (NOTE) Performed At: [facility] [place], [place] [id number] Lindon Romp MD [id number]    Fungus (Mycology) Culture PENDING  Incomplete   Fungal Source FLUID  Final    Comment: PLEURAL PATIENT ON FOLLOWING ZINACEF   Acid Fast Smear (AFB)     Status: None   Collection Time: 09/26/16  9:26 AM  Result Value Ref Range Status   AFB Specimen Processing Concentration  Final   Acid Fast Smear Negative  Final    Comment: (NOTE) Performed At: [facility] [place], [place] [id number] Lindon Romp MD ZS:[id number]    Source (AFB) FLUID  Final    Comment: PLEURAL PATIENT ON FOLLOWING ZINACEF   Culture, body fluid-bottle     Status: None (Preliminary result)   Collection Time: 09/26/16  9:26 AM  Result Value Ref Range Status   Specimen Description FLUID PLEURAL  Final   Special Requests PATIENT ON FOLLOWING  ZINACEF  Final   Culture NO GROWTH 2 DAYS  Final   Report Status PENDING  Incomplete  Gram stain     Status: None   Collection Time: 09/26/16  9:26 AM  Result Value Ref Range Status   Specimen Description FLUID PLEURAL  Final   Special Requests PATIENT ON FOLLOWING  ZINACEF  Final   Gram Stain NO WBC SEEN NO ORGANISMS SEEN   Final   Report Status  09/26/2016 FINAL  Final  Fungus Culture Result     Status: None   Collection Time: 09/26/16  9:26 AM  Result Value Ref Range Status   Result 1 Comment  Final    Comment: (NOTE) KOH/Calcofluor preparation:  no fungus observed. Performed At: [facility] [place], [place] [id number] Lindon Romp MD [id number]      Scheduled Meds: . atorvastatin  10 mg Oral q1800  . Chlorhexidine Gluconate Cloth  6 each Topical Q0600  . diltiazem  120 mg Oral Daily  . DULoxetine  60 mg Oral Daily  . feeding supplement (ENSURE ENLIVE)  237 mL Oral BID BM  . fluticasone  2 spray Each Nare Daily  . folic acid  1 mg Oral Daily  . insulin aspart  0-5 Units Subcutaneous QHS  . insulin aspart  0-9 Units Subcutaneous TID WC  . levalbuterol  1.25 mg Nebulization TID  . metoprolol tartrate  12.5 mg Oral BID  . montelukast  10 mg Oral QHS  . pantoprazole  40 mg Oral Daily  . polyethylene glycol  17 g Oral BID  . senna-docusate  1 tablet Oral BID  . vitamin C  1,000 mg Oral Daily   . heparin 1,600 Units/hr (09/29/16 0459)     LOS: 7 days   Gerlean Ren MD [phone]  On-Call/Text Page:      [email or web address]      password [email or web address]  If 7PM-7AM, please contact night-coverage [email or web address] Password [email or web address] 09/29/2016, 1:08 PM

## 2016-09-29 NOTE — Progress Notes (Addendum)
      [address]       [place] [address]             [phone]       3 Days Post-Op Procedure(s) (LRB): SUBXYPHOID PERICARDIAL WINDOW (N/A) CHEST TUBE INSERTION (Left) TRANSESOPHAGEAL ECHOCARDIOGRAM (TEE) (N/A)  Subjective: Patient sitting in chair about to drink her coffee. She states her breathing is good.  Objective: Vital signs in last 24 hours: Temp:  [97.7 F (36.5 C)-99 F (37.2 C)] 98.5 F (36.9 C) (03/06 0747) Pulse Rate:  [104-109] 106 (03/06 0747) Cardiac Rhythm: Sinus tachycardia (03/06 0747) Resp:  [18-33] 23 (03/06 0747) BP: (97-130)/(36-81) 120/72 (03/06 0747) SpO2:  [96 %-100 %] 100 % (03/06 0747) Weight:  [182 lb 8.7 oz (82.8 kg)] 182 lb 8.7 oz (82.8 kg) (03/06 0454)     Intake/Output from previous day: 03/05 0701 - 03/06 0700 In: 1379.7 [P.O.:960; I.V.:349.7] Out: 1106 [Urine:1000; Chest Tube:106]   Physical Exam:  Cardiovascular: Slightly tachycardic Pulmonary: Clear to auscultation on right and slightly diminished left base Wounds: Subxiphoid wound is clean and dry.  No erythema or signs of infection. Chest Tube and pericardial tube:Both are to suction, no air leak   Lab Results: CBC: Recent Labs  09/28/16 0209 09/29/16 0319  WBC 9.0 9.8  HGB 9.0* 8.6*  HCT 27.8* 26.8*  PLT 228 267   BMET:  Recent Labs  09/28/16 0209 09/29/16 0319  NA 136 138  K 3.2* 4.5  CL 97* 102  CO2 29 27  GLUCOSE 135* 120*  BUN 9 16  CREATININE 1.22* 1.22*  CALCIUM 8.1* 8.4*    PT/INR: No results for input(s): LABPROT, INR in the last 72 hours. ABG:  INR: Will add last result for INR, ABG once components are confirmed Will add last 4 CBG results once components are confirmed  Assessment/Plan:  1. CV - Slightly tachycardic this am. On Cardizem CD 120 mg daily 2.  Pulmonary - NSCC of LUL with metastasis to ribs and spine. Chest tube with 106 cc of output last 24 hours. Per Dr. Prescott Gum, remove subxiphoid pericardial tube.  Left chest tube to remain for now. Will order CXR for am. Encourage incentive spirometer. 3. PE-On Heparin drip 4. Acute on chronic kidney disease (stage III)-creatinine stable at 1.22 5. Anemia-H and H stable at 8.6 and 26.8  ZIMMERMAN,DONIELLE MPA-C 09/29/2016,8:53 AM  Will remove pericardial drain today  waiting on path of pericardial tissue, fluid Ok to start coumadin to treat pulmonary emboli- NOAC not recommended soon after thoracic surgery due to bleeding Heparin dosing ok now to increase therapeutic range  0.4- 0.6  patient examined and medical record reviewed,agree with above note. Tharon Aquas Trigt III 09/29/2016

## 2016-09-29 NOTE — Progress Notes (Signed)
  Physical Therapy Treatment Patient Details Name: ABBEE Buchanan MRN: [id number] DOB: 07/27/1961 Today's Date: 2016/10/21    History of Present Illness Pt adm with worsening SOB and found to have acute PE. Pt developed pericardial effusion with tamponade and underwent pericardial window and chest tube placement. PMH - lung CA, copd    PT Comments    Pt making good progress.    Follow Up Recommendations  No PT follow up     Equipment Recommendations  Other (comment) (To be assessed)    Recommendations for Other Services       Precautions / Restrictions Precautions Precautions: None Restrictions Weight Bearing Restrictions: No    Mobility  Bed Mobility               General bed mobility comments: Pt up in chair  Transfers Overall transfer level: Needs assistance Equipment used: Rolling walker (2 wheeled) Transfers: Sit to/from Stand Sit to Stand: Min guard         General transfer comment: Incr time and effort to rise  Ambulation/Gait Ambulation/Gait assistance: Min guard Ambulation Distance (Feet): 150 Feet Assistive device: Rolling walker (2 wheeled) Gait Pattern/deviations: Step-through pattern;Decreased stride length Gait velocity: decr Gait velocity interpretation: Below normal speed for age/gender General Gait Details: Assist for balance and support. SpO2 to 86% on 4L incr O2 to 6L with SpO2 returning to 91%. HR 120's with gait and 100's at rest.   Stairs            Wheelchair Mobility    Modified Rankin (Stroke Patients Only)       Balance Overall balance assessment: Needs assistance Sitting-balance support: No upper extremity supported;Feet supported Sitting balance-Leahy Scale: Good     Standing balance support: No upper extremity supported;During functional activity Standing balance-Leahy Scale: Fair                      Cognition Arousal/Alertness: Awake/alert Behavior During Therapy: WFL for tasks  assessed/performed Overall Cognitive Status: Within Functional Limits for tasks assessed                      Exercises      General Comments        Pertinent Vitals/Pain Pain Assessment: No/denies pain    Home Living                      Prior Function            PT Goals (current goals can now be found in the care plan section) Progress towards PT goals: Progressing toward goals    Frequency    Min 3X/week      PT Plan Current plan remains appropriate    Co-evaluation             End of Session Equipment Utilized During Treatment: Oxygen Activity Tolerance: Patient tolerated treatment well Patient left: in chair;with call bell/phone within reach Nurse Communication: Mobility status PT Visit Diagnosis: Difficulty in walking, not elsewhere classified (R26.2)     Time: 6301-6010 PT Time Calculation (min) (ACUTE ONLY): 19 min  Charges:  $Gait Training: 8-22 mins                    G CodesShary Buchanan Carol Buchanan 10-21-16, 12:13 PM [employer] PT [phone]

## 2016-09-29 NOTE — Care Management Note (Signed)
Case Management Note  Patient Details  Name: Carol Buchanan MRN: [id number] Date of Birth: 22-Mar-1961  Subjective/Objective:    Pt lives with her mother, reports her mother is in good health and they help each other.  Pharmacy has initiated warfarin for acute PE.                 Expected Discharge Plan:  Home/Self Care  Discharge planning Services  CM Consult  Status of Service:  In process, will continue to follow  Girard Cooter, RN 09/29/2016, 2:44 PM

## 2016-09-30 ENCOUNTER — Inpatient Hospital Stay (HOSPITAL_COMMUNITY): Payer: Medicare Other

## 2016-09-30 LAB — CBC
HEMATOCRIT: 28.8 % — AB (ref 36.0–46.0)
Hemoglobin: 8.9 g/dL — ABNORMAL LOW (ref 12.0–15.0)
MCH: 32.7 pg (ref 26.0–34.0)
MCHC: 30.9 g/dL (ref 30.0–36.0)
MCV: 105.9 fL — AB (ref 78.0–100.0)
PLATELETS: 372 10*3/uL (ref 150–400)
RBC: 2.72 MIL/uL — ABNORMAL LOW (ref 3.87–5.11)
RDW: 19 % — ABNORMAL HIGH (ref 11.5–15.5)
WBC: 10.7 10*3/uL — AB (ref 4.0–10.5)

## 2016-09-30 LAB — GLUCOSE, CAPILLARY
GLUCOSE-CAPILLARY: 147 mg/dL — AB (ref 65–99)
GLUCOSE-CAPILLARY: 146 mg/dL — AB (ref 65–99)
Glucose-Capillary: 118 mg/dL — ABNORMAL HIGH (ref 65–99)
Glucose-Capillary: 100 mg/dL — ABNORMAL HIGH (ref 65–99)
Glucose-Capillary: 117 mg/dL — ABNORMAL HIGH (ref 65–99)

## 2016-09-30 LAB — PROTIME-INR
INR: 1.04
Prothrombin Time: 13.6 seconds (ref 11.4–15.2)

## 2016-09-30 LAB — HEPARIN LEVEL (UNFRACTIONATED)
Heparin Unfractionated: 0.25 IU/mL — ABNORMAL LOW (ref 0.30–0.70)
Heparin Unfractionated: 0.32 IU/mL (ref 0.30–0.70)

## 2016-09-30 MED ORDER — METOPROLOL TARTRATE 25 MG PO TABS
25.0000 mg | ORAL_TABLET | Freq: Two times a day (BID) | ORAL | Status: DC
  Administered 2016-09-30 – 2016-10-02 (×5): 25 mg via ORAL
  Filled 2016-09-30 (×5): qty 1

## 2016-09-30 MED ORDER — WARFARIN SODIUM 5 MG PO TABS
5.0000 mg | ORAL_TABLET | Freq: Once | ORAL | Status: AC
  Administered 2016-09-30: 5 mg via ORAL
  Filled 2016-09-30: qty 1

## 2016-09-30 NOTE — Progress Notes (Signed)
ANTICOAGULATION CONSULT NOTE - Follow Up Consult  Pharmacy Consult for warfarin/heparin Indication: pulmonary embolus  Allergies  Allergen Reactions  . No Known Allergies     Patient Measurements: Height: '5\' 4"'$  (162.6 cm) Weight: 180 lb 5.4 oz (81.8 kg) IBW/kg (Calculated) : 54.7 Heparin Dosing Weight: 72.4kg  Vital Signs: Temp: 98.7 F (37.1 C) (03/07 1100) Temp Source: Oral (03/07 1100) BP: 117/72 (03/07 1200) Pulse Rate: 95 (03/07 1200)  Labs:  Recent Labs  09/28/16 0209  09/29/16 0319 09/29/16 1221 09/30/16 0354 09/30/16 1103  HGB 9.0*  --  8.6*  --  8.9*  --   HCT 27.8*  --  26.8*  --  28.8*  --   PLT 228  --  267  --  372  --   LABPROT  --   --   --   --  13.6  --   INR  --   --   --   --  1.04  --   HEPARINUNFRC 0.13*  < > 0.33 0.36 0.25* 0.32  CREATININE 1.22*  --  1.22*  --   --   --   < > = values in this interval not displayed.  Estimated Creatinine Clearance: 53.2 mL/min (by C-G formula based on SCr of 1.22 mg/dL (H)).   Assessment: 69 yoF on currently on heparin for bilateral PE. She is not s/p pericardial window and chest tube removal. Pharmacy consulted to manage heparin. Last heparin level slightly below goal set by CVTS. INR up slightly; however full effects not realized. Will repeat last nights warfarin dose and increase heparin rate. CBC stable. No bleeding per nurse.   Goal of Therapy:  INR 2-3 Heparin Level 0.4-0.6 per CVTS Monitor platelets by anticoagulation protocol: Yes   Plan:  Give warfarin '5mg'$  x1 tonight Increase heparin to 1750 units/hr Daily heparin level, CBC, and INR Monitor signs/symptoms of bleeding   Dierdre Harness, BS, PharmD Clinical Pharmacy Resident [phone] (Pager) 09/30/2016 2:39 PM

## 2016-09-30 NOTE — Progress Notes (Signed)
[facility] DAILY ICU PROGRESS NOTE                   [address]            [place] [address]          [phone]   4 Days Post-Op Procedure(s) (LRB): SUBXYPHOID PERICARDIAL WINDOW (N/A) CHEST TUBE INSERTION (Left) TRANSESOPHAGEAL ECHOCARDIOGRAM (TEE) (N/A)  Total Length of Stay:  LOS: 8 days   Subjective: conts to feel better, SOB conts to improve  Objective: Vital signs in last 24 hours: Temp:  [97.9 F (36.6 C)-98.5 F (36.9 C)] 98 F (36.7 C) (03/07 0700) Pulse Rate:  [92-108] 102 (03/07 0700) Cardiac Rhythm: Normal sinus rhythm;Sinus tachycardia (03/07 0741) Resp:  [17-25] 20 (03/07 0700) BP: (104-132)/(53-82) 104/82 (03/07 0600) SpO2:  [97 %-99 %] 99 % (03/07 0700) Weight:  [180 lb 5.4 oz (81.8 kg)] 180 lb 5.4 oz (81.8 kg) (03/07 0400)  Filed Weights   09/28/16 0500 09/29/16 0454 09/30/16 0400  Weight: 188 lb 4.4 oz (85.4 kg) 182 lb 8.7 oz (82.8 kg) 180 lb 5.4 oz (81.8 kg)    Weight change: -2 lb 3.3 oz (-1 kg)   Hemodynamic parameters for last 24 hours:    Intake/Output from previous day: 03/06 0701 - 03/07 0700 In: 1915.6 [P.O.:1320; I.V.:595.6] Out: 2092 [Urine:2000; Chest Tube:92]  Intake/Output this shift: Total I/O In: -  Out: 460 [Urine:400; Chest Tube:60]  Current Meds: Scheduled Meds: . atorvastatin  10 mg Oral q1800  . Chlorhexidine Gluconate Cloth  6 each Topical Q0600  . diltiazem  120 mg Oral Daily  . DULoxetine  60 mg Oral Daily  . feeding supplement (ENSURE ENLIVE)  237 mL Oral BID BM  . fluticasone  2 spray Each Nare Daily  . folic acid  1 mg Oral Daily  . insulin aspart  0-5 Units Subcutaneous QHS  . insulin aspart  0-9 Units Subcutaneous TID WC  . levalbuterol  1.25 mg Nebulization TID  . metoprolol tartrate  12.5 mg Oral BID  . montelukast  10 mg Oral QHS  . pantoprazole  40 mg Oral Daily  . polyethylene glycol  17 g Oral BID  . senna-docusate  1 tablet Oral BID  . vitamin C  1,000 mg Oral Daily  . Warfarin -  Pharmacist Dosing Inpatient   Does not apply q1800   Continuous Infusions: . heparin 1,650 Units/hr (09/30/16 0700)   PRN Meds:.sodium chloride, HYDROcodone-acetaminophen, levalbuterol, LORazepam  General appearance: alert, cooperative and no distress Heart: regular rate and rhythm and tachy Lungs: clear to auscultation bilaterally Abdomen: benign Wound: incis healing well  Lab Results: CBC: Recent Labs  09/29/16 0319 09/30/16 0354  WBC 9.8 10.7*  HGB 8.6* 8.9*  HCT 26.8* 28.8*  PLT 267 372   BMET:  Recent Labs  09/28/16 0209 09/29/16 0319  NA 136 138  K 3.2* 4.5  CL 97* 102  CO2 29 27  GLUCOSE 135* 120*  BUN 9 16  CREATININE 1.22* 1.22*  CALCIUM 8.1* 8.4*    CMET: Lab Results  Component Value Date   WBC 10.7 (H) 09/30/2016   HGB 8.9 (L) 09/30/2016   HCT 28.8 (L) 09/30/2016   PLT 372 09/30/2016   GLUCOSE 120 (H) 09/29/2016   ALT 23 09/28/2016   AST 22 09/28/2016   NA 138 09/29/2016   K 4.5 09/29/2016   CL 102 09/29/2016   CREATININE 1.22 (H) 09/29/2016   BUN 16 09/29/2016  CO2 27 09/29/2016   TSH 0.857 11/26/2015   INR 1.04 09/30/2016   HGBA1C 6.7 (H) 11/26/2015      PT/INR:  Recent Labs  09/30/16 0354  LABPROT 13.6  INR 1.04   Radiology: Dg Chest Port 1 View  Result Date: 09/30/2016 CLINICAL DATA:  56 year old female with pericardial effusion, pleural effusion status post subxiphoid pericardial window and chest tube placement on 09/26/2016. Lung cancer. Recent pulmonary emboli. EXAM: PORTABLE CHEST 1 VIEW COMPARISON:  09/28/2016 and earlier. FINDINGS: Portable AP view at 0322 hours. Stable left chest tube. No pneumothorax. Mildly decreased veiling and patchy multifocal opacity in the left lung. Leftward mediastinal shift persists. Stable mediastinal contours. Stable right IJ Port-A-Cath. Stable right lung with patchy medial right lung base opacity. No areas of worsening ventilation. IMPRESSION: 1. Stable left chest tube with no pneumothorax. 2.  Mild regression of veiling and patchy multifocal opacity in the left lung since 09/28/2016. 3. No new cardiopulmonary abnormality. Electronically Signed   By: Genevie Ann M.D.   On: 09/30/2016 06:50     Assessment/Plan: S/P Procedure(s) (LRB): SUBXYPHOID PERICARDIAL WINDOW (N/A) CHEST TUBE INSERTION (Left) TRANSESOPHAGEAL ECHOCARDIOGRAM (TEE) (N/A)  1 conts to improve in her surgical recovery 2 CT no air leak, 92 cc out yesterday, 60 so far today- poss d/c tube soon 3 path and cytology- neg for malignancy 4 AC RX per primary svc- coumadin and heparin    Carol Buchanan E 09/30/2016 8:01 AM

## 2016-09-30 NOTE — Progress Notes (Signed)
Progress Note  Patient Name: Carol Buchanan Date of Encounter: 09/30/2016  Primary Cardiologist: Dr. Acie Fredrickson  Subjective   56 y.o. 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  Was treated with XRT initially and has been on chemoRx since then, f/b Dr Marin Olp.  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  Presenting with one month hx worsening SOB.  CT angio of chest in ED today showed bilat segmental and subsegmental pulm emboli, no R heart strain, mild CHF, pericardial effusion  She had a pericardial window on 09/26/16.    Patient is feeling well; denies chest pain, SOB, and palpitations.     Inpatient Medications    Scheduled Meds: . atorvastatin  10 mg Oral q1800  . Chlorhexidine Gluconate Cloth  6 each Topical Q0600  . diltiazem  120 mg Oral Daily  . DULoxetine  60 mg Oral Daily  . feeding supplement (ENSURE ENLIVE)  237 mL Oral BID BM  . fluticasone  2 spray Each Nare Daily  . folic acid  1 mg Oral Daily  . insulin aspart  0-5 Units Subcutaneous QHS  . insulin aspart  0-9 Units Subcutaneous TID WC  . levalbuterol  1.25 mg Nebulization TID  . metoprolol tartrate  12.5 mg Oral BID  . montelukast  10 mg Oral QHS  . pantoprazole  40 mg Oral Daily  . polyethylene glycol  17 g Oral BID  . senna-docusate  1 tablet Oral BID  . vitamin C  1,000 mg Oral Daily  . Warfarin - Pharmacist Dosing Inpatient   Does not apply q1800   Continuous Infusions: . heparin 1,650 Units/hr (09/30/16 0700)   PRN Meds: sodium chloride, HYDROcodone-acetaminophen, levalbuterol, LORazepam   Vital Signs    Vitals:   09/30/16 0600 09/30/16 0700 09/30/16 0800 09/30/16 0836  BP: 104/82  109/85   Pulse: 100 (!) 102 (!) 102   Resp: (!) 22 20 (!) 22   Temp:  98 F (36.7 C)    TempSrc:      SpO2: 97% 99% 100% 100%  Weight:      Height:        Intake/Output Summary (Last 24 hours) at 09/30/16 0842 Last data filed at 09/30/16 0741  Gross per 24 hour  Intake          1889.59 ml  Output              2510 ml  Net          -620.41 ml   Filed Weights   09/28/16 0500 09/29/16 0454 09/30/16 0400  Weight: 188 lb 4.4 oz (85.4 kg) 182 lb 8.7 oz (82.8 kg) 180 lb 5.4 oz (81.8 kg)     Physical Exam   General: Well developed, well nourished, female appearing in no acute distress. Head: Normocephalic, atraumatic.  Neck: Supple without bruits, JVD Lungs:  Resp regular and unlabored, CTA. Left pleural drain in place with dressing Heart: Regular rhythm, tachycardic rate, S1, S2, no murmur; no rub, pericardial drain in place with dressing Abdomen: Soft, non-tender, non-distended with normoactive bowel sounds. No hepatomegaly. No rebound/guarding. No obvious abdominal masses. Extremities: No clubbing, cyanosis, No edema. Distal pedal pulses are 2+ bilaterally. Neuro: Alert and oriented X 3. Moves all extremities spontaneously. Psych: Normal affect.  Labs    Chemistry  Recent Labs Lab 09/25/16 1425 09/26/16 0219 09/27/16 0018 09/28/16 0209 09/29/16 0319  NA 135 136 137 136 138  K 3.4* 3.1* 3.4* 3.2*  4.5  CL 93* 94* 94* 97* 102  CO2 '27 30 30 29 27  '$ GLUCOSE 138* 137* 110* 135* 120*  BUN '15 14 10 9 16  '$ CREATININE 1.40* 1.31* 1.36* 1.22* 1.22*  CALCIUM 8.9 8.7* 8.4* 8.1* 8.4*  PROT 6.9 6.3*  --  5.9*  --   ALBUMIN 2.9* 2.6*  --  2.2*  --   AST 36 34  --  22  --   ALT 34 32  --  23  --   ALKPHOS 145* 143*  --  128*  --   BILITOT 0.5 0.4  --  0.8  --   GFRNONAA 41* 45* 43* 49* 49*  GFRAA 48* 52* 49* 56* 56*  ANIONGAP '15 12 13 10 9     '$ Hematology  Recent Labs Lab 09/28/16 0209 09/29/16 0319 09/30/16 0354  WBC 9.0 9.8 10.7*  RBC 2.71* 2.55* 2.72*  HGB 9.0* 8.6* 8.9*  HCT 27.8* 26.8* 28.8*  MCV 102.6* 105.1* 105.9*  MCH 33.2 33.7 32.7  MCHC 32.4 32.1 30.9  RDW 18.2* 18.9* 19.0*  PLT 228 267 372    Cardiac EnzymesNo results for input(s): TROPONINI in the last 168 hours.  No results for input(s): TROPIPOC in the last 168 hours.   BNP No results for  input(s): BNP, PROBNP in the last 168 hours.   DDimer No results for input(s): DDIMER in the last 168 hours.   Radiology    Dg Chest Port 1 View  Result Date: 09/30/2016 CLINICAL DATA:  57 year old female with pericardial effusion, pleural effusion status post subxiphoid pericardial window and chest tube placement on 09/26/2016. Lung cancer. Recent pulmonary emboli. EXAM: PORTABLE CHEST 1 VIEW COMPARISON:  09/28/2016 and earlier. FINDINGS: Portable AP view at 0322 hours. Stable left chest tube. No pneumothorax. Mildly decreased veiling and patchy multifocal opacity in the left lung. Leftward mediastinal shift persists. Stable mediastinal contours. Stable right IJ Port-A-Cath. Stable right lung with patchy medial right lung base opacity. No areas of worsening ventilation. IMPRESSION: 1. Stable left chest tube with no pneumothorax. 2. Mild regression of veiling and patchy multifocal opacity in the left lung since 09/28/2016. 3. No new cardiopulmonary abnormality. Electronically Signed   By: Genevie Ann M.D.   On: 09/30/2016 06:50     Telemetry    Sinus tachycardia 100-110s - Personally Reviewed  ECG     09/25/16: Sinus tachycardia, 101 bpm  - Personally Reviewed   Cardiac Studies   TEE 09/26/16:  Left ventricle: Normal cavity size, wall thickness, left ventricular diastolic function and left atrial pressure. LV systolic function is low normal with an EF of 50-55%.  Aortic valve: No stenosis. Trace regurgitation.  Mitral valve: Trace regurgitation.  Right ventricle: Normal cavity size, wall thickness and ejection fraction. Mildly compressed by pericardial fluid. This is notably improved after pericardial window procedure.  Tricuspid valve: Mild to moderate regurgitation.  Pericardium: Circumferential pericardial effusion. Effusion size is 168m.     Patient Profile     56y.o. female with lung CA, pericardial effusion, pleural effusion, and PE who presented with chest pain.    Assessment & Plan    1. Pericardial effusion - s/p pericardial window on 3/3  - mediastinal drain with 32 cc/24 hr - pt states chest pain is better  2. Pleural effusion - s/p CT placement,  Drainage is slowing    3. Sinus tachycardia No wheezing on exam today  Will increase  Metoprolol to 25 mg PO BID    3.  HTN - pt is tolerating PO cardizem; BP 110-120 / 60-70s - HR continues in the 100-110s, likely secondary to PE and hypoxia - hold home verapamil    4. AKI - sCr 1.22 (1.36) with good urine output - diuresed 2.3 L yesterday        Mertie Moores, MD  09/30/2016 8:42 AM    [facility] [place],  [place], [place]  [address] Pager [phone] Phone: [phone]; Fax: [phone]

## 2016-09-30 NOTE — Progress Notes (Signed)
[place] TEAM 1 - Stepdown/ICU TEAM  Carol Buchanan  SEG:[id number] DOB: 28-Nov-1960 DOA: 09/22/2016 PCP: Andria Frames, MD    Brief Narrative:  56 y.o. female with history of [place] Lung CA dx'd in Nov 2016. Was treated with XRT initially and has been on chemoRx since then per Dr Marin Olp. Has had mets to rib and spine. Presented with one month hx worsening SOB. CT angio of chest in ED noted bilat segmental and subsegmental pulm emboli w/ no R heart strain.  Subjective: Patient is resting comfortably in a bedside chair.  She is highly motivated to get up and get moving around as she has been doing.  She is motivated to be discharged home as soon as possible.  She denies chest pain nausea vomiting or abdominal pain.  She is quite pleasant.  Assessment & Plan:  Acute Pulmonary embolism Transitioning to Coumadin w/ heparin overlap   Pericardial effusion w/ tamponade - Pleural effusion  TTE noted physiologic tamponade - s/p pericardial window/drain and chest tube - tube management per [facility] - appears that ouput from CT is declining - pericardial drain now out   Acute kidney injury on chronic kidney disease stage III Baseline creatinine appears to be approximately 1.4 - avoid diuresis, contrast, ACEi/ARB - renal fxn stable at baseline  Recent Labs Lab 09/25/16 1425 09/26/16 0219 09/27/16 0018 09/28/16 0209 09/29/16 0319  CREATININE 1.40* 1.31* 1.36* 1.22* 1.22*    Pancytopenia Due to active chemotherapy  Chronic diastolic heart failure Baseline weight appears to be approximately 83 kg - net negative ~6 L since admission - no signif overload at this time  Filed Weights   09/28/16 0500 09/29/16 0454 09/30/16 0400  Weight: 85.4 kg (188 lb 4.4 oz) 82.8 kg (182 lb 8.7 oz) 81.8 kg (180 lb 5.4 oz)    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  DVT prophylaxis: IV heparin > warfarin  Code Status: FULL CODE Family Communication: no family present at time of exam  Disposition Plan: stable for SDU capable of handling chest tube  Consultants:  [facility] PCCM Cardiology  Procedures: 3/1 TTE - EF 55-60 percent with no regional wall motion abnormalities with large pericardial effusion and features suggestive of tamponade   2/28 bilateral lower extremity venous duplex without evidence of DVT  3/3 subxyphoid pericardial window - L chest tube   Antimicrobials:  none  Objective: Blood pressure 122/66, pulse (!) 103, temperature 98 F (36.7 C), resp. rate (!) 21, height '5\' 4"'$  (1.626 m), weight 81.8 kg (180 lb 5.4 oz), SpO2 92 %.  Intake/Output Summary (Last 24 hours) at 09/30/16 1109 Last data filed at 09/30/16 0900  Gross per 24 hour  Intake          1211.59 ml  Output             2510 ml  Net         -1298.41 ml   Filed Weights   09/28/16 0500 09/29/16 0454 09/30/16 0400  Weight: 85.4 kg (188 lb 4.4 oz) 82.8 kg (182 lb 8.7 oz) 81.8 kg (180 lb 5.4 oz)    Examination: General: No acute respiratory distress - alert/pleasant  Lungs: Clear to auscultation bilaterally - no wheezing  Cardiovascular: RRR - no M  Abdomen: Nondistended, soft, bs+  Extremities: no signif B LE edema   CBC:  Recent Labs Lab 09/26/16 0219 09/27/16 0018 09/28/16 0209 09/29/16 0319 09/30/16 0354  WBC 2.8* 6.1 9.0 9.8 10.7*  HGB 7.8* 9.1* 9.0* 8.6* 8.9*  HCT 24.4* 27.8* 27.8* 26.8* 28.8*  MCV 103.0* 102.6* 102.6* 105.1* 105.9*  PLT 149* 195 228 267 947   Basic Metabolic Panel:  Recent Labs Lab 09/25/16 1425 09/26/16 0219 09/27/16 0018 09/28/16 0209 09/29/16 0319  NA 135 136 137 136 138  K 3.4* 3.1* 3.4* 3.2* 4.5  CL 93* 94* 94* 97* 102  CO2 '27 30 30 29 27  '$ GLUCOSE 138* 137* 110* 135* 120*  BUN '15 14 10  9 16  '$ CREATININE 1.40* 1.31* 1.36* 1.22* 1.22*  CALCIUM 8.9 8.7* 8.4* 8.1* 8.4*  MG  --   --   --   --  1.6*   GFR: Estimated Creatinine Clearance: 53.2 mL/min (by C-G formula based on SCr of 1.22 mg/dL (H)).  Liver Function Tests:  Recent Labs Lab 09/25/16 0327 09/25/16 1425 09/26/16 0219 09/28/16 0209  AST 31 36 34 22  ALT 32 34 32 23  ALKPHOS 142* 145* 143* 128*  BILITOT 0.9 0.5 0.4 0.8  PROT 6.8 6.9 6.3* 5.9*  ALBUMIN 2.7* 2.9* 2.6* 2.2*    Coagulation Profile:  Recent Labs Lab 09/25/16 0327 09/25/16 1425 09/30/16 0354  INR 1.30 1.18 1.04    HbA1C: Hgb A1c MFr Bld  Date/Time Value Ref Range Status  11/26/2015 02:54 AM 6.7 (H) 4.8 - 5.6 % Final    Comment:    (NOTE)         Pre-diabetes: 5.7 - 6.4         Diabetes: >6.4         Glycemic control for adults with diabetes: <7.0     CBG:  Recent Labs Lab 09/29/16 0755 09/29/16 1125 09/29/16 1610 09/29/16 2156 09/30/16 0750  GLUCAP 129* 105* 121* 118* 100*    Recent Results (from the past 240 hour(s))  MRSA PCR Screening     Status: None   Collection Time: 09/24/16  2:53 PM  Result Value Ref Range Status   MRSA by PCR NEGATIVE NEGATIVE Final    Comment:        The GeneXpert MRSA Assay (FDA approved for NASAL specimens only), is one component of a comprehensive MRSA colonization surveillance program. It is not intended to diagnose MRSA infection nor to guide or monitor treatment for MRSA infections.   Surgical pcr screen     Status: None   Collection Time: 09/24/16  4:49 PM  Result Value Ref Range Status   MRSA, PCR NEGATIVE NEGATIVE Final   Staphylococcus aureus NEGATIVE NEGATIVE Final    Comment:        The Xpert SA Assay (FDA approved for NASAL specimens in patients over 28 years of age), is one component of a comprehensive surveillance program.  Test performance has been validated by [facility] for patients greater than or equal to 79 year old. It is not intended to diagnose  infection nor to guide or monitor treatment.   Fungus Culture With Stain     Status: None (Preliminary result)   Collection Time: 09/26/16  9:01 AM  Result Value Ref Range Status   Fungus Stain Final report  Final    Comment: (NOTE) Performed At: [name] [place], [place] [id number] Lindon Romp MD [id number]  Fungus (Mycology) Culture PENDING  Incomplete   Fungal Source FLUID  Final    Comment: PERICARDIAL PATIENT ON FOLLOWING  ZINACEF   Acid Fast Smear (AFB)     Status: None   Collection Time: 09/26/16  9:01 AM  Result Value Ref Range Status   AFB Specimen Processing Concentration  Final   Acid Fast Smear Negative  Final    Comment: (NOTE) Performed At: [facility] [place], [place] [id number] Lindon Romp MD SA:[id number]    Source (AFB) FLUID  Final    Comment: PERICARDIAL PATIENT ON FOLLOWING ZINACEF   Culture, body fluid-bottle     Status: None (Preliminary result)   Collection Time: 09/26/16  9:01 AM  Result Value Ref Range Status   Specimen Description FLUID PERICARDIAL  Final   Special Requests PATIENT ON FOLLOWING ZINACEF  Final   Culture NO GROWTH 3 DAYS  Final   Report Status PENDING  Incomplete  Gram stain     Status: None   Collection Time: 09/26/16  9:01 AM  Result Value Ref Range Status   Specimen Description FLUID PERICARDIAL  Final   Special Requests PATIENT ON FOLLOWING  ZINACEF  Final   Gram Stain   Final    RARE WBC PRESENT,BOTH PMN AND MONONUCLEAR NO ORGANISMS SEEN    Report Status 09/26/2016 FINAL  Final  Fungus Culture Result     Status: None   Collection Time: 09/26/16  9:01 AM  Result Value Ref Range Status   Result 1 Comment  Final    Comment: (NOTE) KOH/Calcofluor preparation:  no fungus observed. Performed At: [facility] [place], [place] [id number] Lindon Romp MD UR:[id number]   Fungus Culture With Stain     Status: None (Preliminary  result)   Collection Time: 09/26/16  9:12 AM  Result Value Ref Range Status   Fungus Stain Final report  Final    Comment: (NOTE) Performed At: [facility] [place], [place] [id number] Lindon Romp MD WV:[id number]    Fungus (Mycology) Culture PENDING  Incomplete   Fungal Source TISSUE  Final    Comment: PERICARDIUM PATIENT ON FOLLOWING ZINACEF   Aerobic/Anaerobic Culture (surgical/deep wound)     Status: None (Preliminary result)   Collection Time: 09/26/16  9:12 AM  Result Value Ref Range Status   Specimen Description TISSUE  Final   Special Requests   Final    PERICARDIUM SPECIMEN C PATIENT ON FOLLOWING ZINACEF   Gram Stain NO WBC SEEN NO ORGANISMS SEEN   Final   Culture NO GROWTH 3 DAYS  Final   Report Status PENDING  Incomplete  Acid Fast Smear (AFB)     Status: None   Collection Time: 09/26/16  9:12 AM  Result Value Ref Range Status   AFB Specimen Processing Comment  Final    Comment: Tissue Grinding and Digestion/Decontamination   Acid Fast Smear Negative  Final    Comment: (NOTE) Performed At: [facility] [place], [place] [id number] Lindon Romp MD [id number]    Source (AFB) TISSUE  Final    Comment: PERICARDIUM PATIENT ON FOLLOWING  ZINACEF   Fungus Culture Result     Status: None   Collection Time: 09/26/16  9:12 AM  Result Value Ref Range Status   Result 1 Comment  Final    Comment: (NOTE) KOH/Calcofluor preparation:  no fungus observed. Performed At: [facility] [address] [place], [place] [id number] Darrel Hoover  F MD JG:[id number]   Fungus Culture With Stain     Status: None (Preliminary result)   Collection Time: 09/26/16  9:26 AM  Result Value Ref Range Status   Fungus Stain Final report  Final    Comment: (NOTE) Performed At: [facility] [place], [place] [id number] Lindon Romp MD [id number]    Fungus (Mycology) Culture PENDING   Incomplete   Fungal Source FLUID  Final    Comment: PLEURAL PATIENT ON FOLLOWING ZINACEF   Acid Fast Smear (AFB)     Status: None   Collection Time: 09/26/16  9:26 AM  Result Value Ref Range Status   AFB Specimen Processing Concentration  Final   Acid Fast Smear Negative  Final    Comment: (NOTE) Performed At: [facility] [place], [place] [id number] Lindon Romp MD FF:[id number]    Source (AFB) FLUID  Final    Comment: PLEURAL PATIENT ON FOLLOWING ZINACEF   Culture, body fluid-bottle     Status: None (Preliminary result)   Collection Time: 09/26/16  9:26 AM  Result Value Ref Range Status   Specimen Description FLUID PLEURAL  Final   Special Requests PATIENT ON FOLLOWING  ZINACEF  Final   Culture NO GROWTH 3 DAYS  Final   Report Status PENDING  Incomplete  Gram stain     Status: None   Collection Time: 09/26/16  9:26 AM  Result Value Ref Range Status   Specimen Description FLUID PLEURAL  Final   Special Requests PATIENT ON FOLLOWING  ZINACEF  Final   Gram Stain NO WBC SEEN NO ORGANISMS SEEN   Final   Report Status 09/26/2016 FINAL  Final  Fungus Culture Result     Status: None   Collection Time: 09/26/16  9:26 AM  Result Value Ref Range Status   Result 1 Comment  Final    Comment: (NOTE) KOH/Calcofluor preparation:  no fungus observed. Performed At: [facility] [address] [place], [place] [id number] Lindon Romp MD QZ:[id number]      Scheduled Meds: . atorvastatin  10 mg Oral q1800  . Chlorhexidine Gluconate Cloth  6 each Topical Q0600  . diltiazem  120 mg Oral Daily  . DULoxetine  60 mg Oral Daily  . feeding supplement (ENSURE ENLIVE)  237 mL Oral BID BM  . fluticasone  2 spray Each Nare Daily  . folic acid  1 mg Oral Daily  . insulin aspart  0-5 Units Subcutaneous QHS  . insulin aspart  0-9 Units Subcutaneous TID WC  . levalbuterol  1.25 mg Nebulization TID  . metoprolol tartrate  25 mg Oral BID  . montelukast   10 mg Oral QHS  . pantoprazole  40 mg Oral Daily  . polyethylene glycol  17 g Oral BID  . senna-docusate  1 tablet Oral BID  . vitamin C  1,000 mg Oral Daily  . Warfarin - Pharmacist Dosing Inpatient   Does not apply q1800   . heparin 1,650 Units/hr (09/30/16 0700)     LOS: 8 days   Cherene Altes, MD [facility] Office  [phone] Pager - Text Page per Amion as per below:  On-Call/Text Page:      [email or web address]      password [email or web address]  If 7PM-7AM, please contact night-coverage [email or web address] Password [email or web address] 09/30/2016, 11:09 AM

## 2016-09-30 NOTE — Progress Notes (Signed)
ANTICOAGULATION CONSULT NOTE - Follow Up Consult  Pharmacy Consult for heparin Indication: pulmonary embolus  Allergies  Allergen Reactions  . No Known Allergies     Patient Measurements: Height: '5\' 4"'$  (162.6 cm) Weight: 180 lb 5.4 oz (81.8 kg) IBW/kg (Calculated) : 54.7 Heparin Dosing Weight: 72.4kg  Vital Signs: Temp: 97.9 F (36.6 C) (03/07 0400) Temp Source: Oral (03/07 0400) BP: 119/74 (03/07 0500) Pulse Rate: 97 (03/07 0500)  Labs:  Recent Labs  09/28/16 0209  09/29/16 0319 09/29/16 1221 09/30/16 0354  HGB 9.0*  --  8.6*  --  8.9*  HCT 27.8*  --  26.8*  --  28.8*  PLT 228  --  267  --  372  LABPROT  --   --   --   --  13.6  INR  --   --   --   --  1.04  HEPARINUNFRC 0.13*  < > 0.33 0.36 0.25*  CREATININE 1.22*  --  1.22*  --   --   < > = values in this interval not displayed.  Estimated Creatinine Clearance: 53.2 mL/min (by C-G formula based on SCr of 1.22 mg/dL (H)).   Assessment: 13 yoF on currently on heparin for bilateral PE. She is not s/p pericardial window and chest tube removal. Pt on heparin bridge to coumadin.   Heparin level down to 0.25 (subtherapeutic) this a.m. However heparin was off at least 20 minutes prior to heparin level as pt pulled out IV site. Also some bleeding from IV site.  Goal of Therapy:  INR 2-3 Heparin Level 0.4-0.6 per CVTS Monitor platelets by anticoagulation protocol: Yes   Plan:  Continue heparin at 1650 units/hr Will f/u 6 hr heparin level  Sherlon Handing, PharmD, BCPS Clinical pharmacist, pager [phone] 09/30/2016 5:11 AM

## 2016-10-01 DIAGNOSIS — R1084 Generalized abdominal pain: Secondary | ICD-10-CM

## 2016-10-01 DIAGNOSIS — I2602 Saddle embolus of pulmonary artery with acute cor pulmonale: Secondary | ICD-10-CM

## 2016-10-01 DIAGNOSIS — D649 Anemia, unspecified: Secondary | ICD-10-CM

## 2016-10-01 DIAGNOSIS — Z419 Encounter for procedure for purposes other than remedying health state, unspecified: Secondary | ICD-10-CM

## 2016-10-01 DIAGNOSIS — R0602 Shortness of breath: Secondary | ICD-10-CM

## 2016-10-01 LAB — AEROBIC/ANAEROBIC CULTURE W GRAM STAIN (SURGICAL/DEEP WOUND)
Culture: NO GROWTH
Gram Stain: NONE SEEN

## 2016-10-01 LAB — PROTIME-INR
INR: 1.18
PROTHROMBIN TIME: 15 s (ref 11.4–15.2)

## 2016-10-01 LAB — CBC
HCT: 26.6 % — ABNORMAL LOW (ref 36.0–46.0)
Hemoglobin: 8.4 g/dL — ABNORMAL LOW (ref 12.0–15.0)
MCH: 33.3 pg (ref 26.0–34.0)
MCHC: 31.6 g/dL (ref 30.0–36.0)
MCV: 105.6 fL — ABNORMAL HIGH (ref 78.0–100.0)
PLATELETS: 341 10*3/uL (ref 150–400)
RBC: 2.52 MIL/uL — ABNORMAL LOW (ref 3.87–5.11)
RDW: 18.9 % — AB (ref 11.5–15.5)
WBC: 8.9 10*3/uL (ref 4.0–10.5)

## 2016-10-01 LAB — CULTURE, BODY FLUID-BOTTLE
CULTURE: NO GROWTH
CULTURE: NO GROWTH

## 2016-10-01 LAB — HEPARIN LEVEL (UNFRACTIONATED): HEPARIN UNFRACTIONATED: 0.51 [IU]/mL (ref 0.30–0.70)

## 2016-10-01 LAB — GLUCOSE, CAPILLARY
GLUCOSE-CAPILLARY: 94 mg/dL (ref 65–99)
Glucose-Capillary: 98 mg/dL (ref 65–99)
Glucose-Capillary: 103 mg/dL — ABNORMAL HIGH (ref 65–99)
Glucose-Capillary: 98 mg/dL (ref 65–99)

## 2016-10-01 LAB — CULTURE, BODY FLUID W GRAM STAIN -BOTTLE

## 2016-10-01 LAB — PREPARE RBC (CROSSMATCH)

## 2016-10-01 MED ORDER — SODIUM CHLORIDE 0.9 % IV SOLN
Freq: Once | INTRAVENOUS | Status: AC
  Administered 2016-10-01: 12:00:00 via INTRAVENOUS

## 2016-10-01 MED ORDER — DABIGATRAN ETEXILATE MESYLATE 150 MG PO CAPS
150.0000 mg | ORAL_CAPSULE | Freq: Two times a day (BID) | ORAL | Status: DC
  Administered 2016-10-01 – 2016-10-02 (×3): 150 mg via ORAL
  Filled 2016-10-01 (×3): qty 1

## 2016-10-01 MED ORDER — FUROSEMIDE 10 MG/ML IJ SOLN
20.0000 mg | Freq: Once | INTRAMUSCULAR | Status: AC
  Administered 2016-10-01: 20 mg via INTRAVENOUS
  Filled 2016-10-01: qty 2

## 2016-10-01 NOTE — Progress Notes (Signed)
Occupational Therapy Treatment Patient Details Name: MING KUNKA MRN: [id number] DOB: 09-05-1960 Today's Date: 10/01/2016    History of present illness Pt adm with worsening SOB and found to have acute PE. Pt developed pericardial effusion with tamponade and underwent pericardial window and chest tube placement. PMH - lung CA, copd   OT comments  Pt currently overall supervision for ADL and functional mobility. Reviewed energy conservation strategies with pt; she verbalized understanding. D/c plan remains appropriate. Will continue to follow acutely.   Follow Up Recommendations  No OT follow up    Equipment Recommendations  None recommended by OT    Recommendations for Other Services      Precautions / Restrictions Precautions Precautions: None Precaution Comments: supplemental O2 Restrictions Weight Bearing Restrictions: No       Mobility Bed Mobility               General bed mobility comments: Pt OOB in chair upon arrival.  Transfers Overall transfer level: Needs assistance Equipment used: None Transfers: Sit to/from Stand Sit to Stand: Supervision         General transfer comment: Supervision for safety; no physical assist required    Balance Overall balance assessment: Needs assistance Sitting-balance support: Feet supported;No upper extremity supported Sitting balance-Leahy Scale: Good     Standing balance support: No upper extremity supported;During functional activity Standing balance-Leahy Scale: Good Standing balance comment: Able to stand statically without UE support but used RW for longer distances.                   ADL Overall ADL's : Needs assistance/impaired                     Lower Body Dressing: Supervision/safety;Sit to/from stand   Toilet Transfer: Supervision/safety       Tub/ Banker: Supervision/safety;Tub transfer;Ambulation;Shower Scientist, research (medical) Details (indicate cue type and  reason): Pt reports she uses shower chair for energy conservation. Functional mobility during ADLs: Supervision/safety General ADL Comments: Reviewed energy conservation strategies with pt; she reports she has handout and verbalized understanding of strategies to use at home.      Vision                     Perception     Praxis      Cognition   Behavior During Therapy: WFL for tasks assessed/performed Overall Cognitive Status: Within Functional Limits for tasks assessed                         Exercises     Shoulder Instructions       General Comments      Pertinent Vitals/ Pain       Pain Assessment: No/denies pain  Home Living                                          Prior Functioning/Environment              Frequency  Min 2X/week        Progress Toward Goals  OT Goals(current goals can now be found in the care plan section)  Progress towards OT goals: Progressing toward goals  Acute Rehab OT Goals Patient Stated Goal: home tomorrow OT Goal Formulation: With patient  Plan Discharge plan remains appropriate    Co-evaluation  End of Session Equipment Utilized During Treatment: Oxygen  OT Visit Diagnosis: Unsteadiness on feet (R26.81)   Activity Tolerance Patient tolerated treatment well   Patient Left in chair;with call bell/phone within reach;with chair alarm set   Nurse Communication          Time: 8185-6314 OT Time Calculation (min): 11 min  Charges: OT General Charges $OT Visit: 1 Procedure OT Treatments $Self Care/Home Management : 8-22 mins  Chavis Tessler A. Ulice Brilliant, M.S., OTR/L Pager: [place] 10/01/2016, 1:49 PM

## 2016-10-01 NOTE — Progress Notes (Signed)
ANTICOAGULATION CONSULT NOTE - Follow Up Consult  Pharmacy Consult for warfarin/heparin -> dabigatran Indication: pulmonary embolus  Allergies  Allergen Reactions  . No Known Allergies     Patient Measurements: Height: '5\' 4"'$  (162.6 cm) Weight: 179 lb 14.4 oz (81.6 kg) IBW/kg (Calculated) : 54.7 Heparin Dosing Weight: 72.4kg  Vital Signs: Temp: 97.8 F (36.6 C) (03/08 0600) Temp Source: Oral (03/08 0600) BP: 142/69 (03/08 0600) Pulse Rate: 103 (03/07 2300)  Labs:  Recent Labs  09/29/16 0319  09/30/16 0354 09/30/16 1103 10/01/16 0342  HGB 8.6*  --  8.9*  --  8.4*  HCT 26.8*  --  28.8*  --  26.6*  PLT 267  --  372  --  341  LABPROT  --   --  13.6  --  15.0  INR  --   --  1.04  --  1.18  HEPARINUNFRC 0.33  < > 0.25* 0.32 0.51  CREATININE 1.22*  --   --   --   --   < > = values in this interval not displayed.  Estimated Creatinine Clearance: 53.2 mL/min (by C-G formula based on SCr of 1.22 mg/dL (H)).   Assessment: 74 yoF on currently on heparin bridge to coumadin for bilateral PE. To transition to dabigatran for VTE treatment. Hgb low but stable.   Goal of Therapy:  Monitor platelets by anticoagulation protocol: Yes   Plan:  D/c heparin when first dose of dabigatran given Dabigatran '150mg'$  po BID Will f/u s/s bleeding  Sherlon Handing, PharmD, BCPS Clinical pharmacist, pager [phone] 10/01/2016 6:51 AM  6:49 AM

## 2016-10-01 NOTE — Progress Notes (Addendum)
[facility] TEAM 1 - Stepdown/ICU TEAM  Carol Buchanan  ASN:[id number] DOB: 1961/03/08 DOA: 09/22/2016 PCP: Andria Frames, MD    Brief Narrative:  56 y.o. female with history of [place] [facility] CA dx'd in Nov 2016. Was treated with XRT initially and has been on chemoRx since then per Dr Marin Olp. Has had mets to rib and spine. Presented with one month hx worsening SOB. CT angio of chest in ED noted bilat segmental and subsegmental pulm emboli w/ no R heart strain.  Subjective: Patient states she is doing well today and does not have any complaints. She's been doing well with physical therapy. She does state if she does overexert herself she may get a little short of breath. Assessment & Plan:  Acute Pulmonary embolism Transitioning to Coumadin w/ heparin overlap  Oncology suggested patient should be on Pradaxa. I spoke with cardiothoracic surgery, still waiting to hear back if it's okay to put her on Pradaxa.  Addendum 445pm Ok to start Pradaxa per CT surgery  Pericardial effusion w/ tamponade - Pleural effusion  TTE noted physiologic tamponade - s/p pericardial window/drain and chest tube - tube management per TCTS - appears that ouput from CT is declining - pericardial drain now out  Chest tube will be discontinued by cardiothoracic surgery  Acute kidney injury on chronic kidney disease stage III Baseline creatinine appears to be approximately 1.4 - avoid diuresis, contrast, ACEi/ARB - renal fxn stable at baseline  Recent Labs Lab 09/25/16 1425 09/26/16 0219 09/27/16 0018 09/28/16 0209 09/29/16 0319  CREATININE 1.40* 1.31* 1.36* 1.22* 1.22*    Pancytopenia Due to active chemotherapy  Chronic diastolic heart failure Baseline weight appears to be approximately 83 kg - net negative ~6 L since admission - no signif overload at this time  Filed Weights   09/29/16 0454 09/30/16 0400 10/01/16 0600  Weight: 82.8 kg (182 lb 8.7 oz) 81.8 kg (180 lb 5.4 oz) 81.6 kg (179 lb 14.4 oz)     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  Consultants:  TCTS PCCM Cardiology  Procedures: 3/1 TTE - EF 55-60 percent with no regional wall motion abnormalities with large pericardial effusion and features suggestive of tamponade   2/28 bilateral lower extremity venous duplex without evidence of DVT  3/3 subxyphoid pericardial window - L chest tube   Antimicrobials:  none  Objective: Blood pressure 113/78, pulse 93, temperature 98.3 F (36.8 C), temperature source Oral, resp. rate (!) 21, height '5\' 4"'$  (1.626 m), weight 81.6 kg (179 lb 14.4 oz), SpO2 98 %.  Intake/Output Summary (Last 24 hours) at 10/01/16 1630 Last data filed at 10/01/16 1612  Gross per 24 hour  Intake              375 ml  Output             1350 ml  Net             -975 ml   Filed Weights   09/29/16 0454 09/30/16 0400 10/01/16 0600  Weight: 82.8 kg (  182 lb 8.7 oz) 81.8 kg (180 lb 5.4 oz) 81.6 kg (179 lb 14.4 oz)    Examination: General: No acute respiratory distress - alert/pleasant  Lungs: Clear to auscultation bilaterally - no wheezing  Cardiovascular: RRR - no M  Abdomen: Nondistended, soft, bs+  Extremities: no signif B LE edema   CBC:  Recent Labs Lab 09/27/16 0018 09/28/16 0209 09/29/16 0319 09/30/16 0354 10/01/16 0342  WBC 6.1 9.0 9.8 10.7* 8.9  HGB 9.1* 9.0* 8.6* 8.9* 8.4*  HCT 27.8* 27.8* 26.8* 28.8* 26.6*  MCV 102.6* 102.6* 105.1* 105.9* 105.6*  PLT 195 228 267 372 151   Basic Metabolic  Panel:  Recent Labs Lab 09/25/16 1425 09/26/16 0219 09/27/16 0018 09/28/16 0209 09/29/16 0319  NA 135 136 137 136 138  K 3.4* 3.1* 3.4* 3.2* 4.5  CL 93* 94* 94* 97* 102  CO2 '27 30 30 29 27  '$ GLUCOSE 138* 137* 110* 135* 120*  BUN '15 14 10 9 16  '$ CREATININE 1.40* 1.31* 1.36* 1.22* 1.22*  CALCIUM 8.9 8.7* 8.4* 8.1* 8.4*  MG  --   --   --   --  1.6*   GFR: Estimated Creatinine Clearance: 53.2 mL/min (by C-G formula based on SCr of 1.22 mg/dL (H)).  Liver Function Tests:  Recent Labs Lab 09/25/16 0327 09/25/16 1425 09/26/16 0219 09/28/16 0209  AST 31 36 34 22  ALT 32 34 32 23  ALKPHOS 142* 145* 143* 128*  BILITOT 0.9 0.5 0.4 0.8  PROT 6.8 6.9 6.3* 5.9*  ALBUMIN 2.7* 2.9* 2.6* 2.2*    Coagulation Profile:  Recent Labs Lab 09/25/16 0327 09/25/16 1425 09/30/16 0354 10/01/16 0342  INR 1.30 1.18 1.04 1.18    HbA1C: Hgb A1c MFr Bld  Date/Time Value Ref Range Status  11/26/2015 02:54 AM 6.7 (H) 4.8 - 5.6 % Final    Comment:    (NOTE)         Pre-diabetes: 5.7 - 6.4         Diabetes: >6.4         Glycemic control for adults with diabetes: <7.0     CBG:  Recent Labs Lab 09/30/16 1119 09/30/16 1623 09/30/16 2022 10/01/16 0732 10/01/16 1142  GLUCAP 147* 117* 146* 98 103*    Recent Results (from the past 240 hour(s))  MRSA PCR Screening     Status: None   Collection Time: 09/24/16  2:53 PM  Result Value Ref Range Status   MRSA by PCR NEGATIVE NEGATIVE Final    Comment:        The GeneXpert MRSA Assay (FDA approved for NASAL specimens only), is one component of a comprehensive MRSA colonization surveillance program. It is not intended to diagnose MRSA infection nor to guide or monitor treatment for MRSA infections.   Surgical pcr screen     Status: None   Collection Time: 09/24/16  4:49 PM  Result Value Ref Range Status   MRSA, PCR NEGATIVE NEGATIVE Final   Staphylococcus aureus NEGATIVE NEGATIVE Final    Comment:        The Xpert SA Assay  (FDA approved for NASAL specimens in patients over 13 years of age), is one component of a comprehensive surveillance program.  Test performance has been validated by [facility] for patients greater than or equal to 20 year old. It is not intended to diagnose infection nor to guide or monitor treatment.   Fungus Culture With Stain     Status: None (Preliminary result)   Collection  Time: 09/26/16  9:01 AM  Result Value Ref Range Status   Fungus Stain Final report  Final    Comment: (NOTE) Performed At: [facility] [place], [place] [id number] Lindon Romp MD [id number]    Fungus (Mycology) Culture PENDING  Incomplete   Fungal Source FLUID  Final    Comment: PERICARDIAL PATIENT ON FOLLOWING  ZINACEF   Acid Fast Smear (AFB)     Status: None   Collection Time: 09/26/16  9:01 AM  Result Value Ref Range Status   AFB Specimen Processing Concentration  Final   Acid Fast Smear Negative  Final    Comment: (NOTE) Performed At: [facility] [address] [place], [place] [id number] Lindon Romp MD [id number]    Source (AFB) FLUID  Final    Comment: PERICARDIAL PATIENT ON FOLLOWING ZINACEF   Culture, body fluid-bottle     Status: None   Collection Time: 09/26/16  9:01 AM  Result Value Ref Range Status   Specimen Description FLUID PERICARDIAL  Final   Special Requests PATIENT ON FOLLOWING ZINACEF  Final   Culture NO GROWTH 5 DAYS  Final   Report Status 10/01/2016 FINAL  Final  Gram stain     Status: None   Collection Time: 09/26/16  9:01 AM  Result Value Ref Range Status   Specimen Description FLUID PERICARDIAL  Final   Special Requests PATIENT ON FOLLOWING  ZINACEF  Final   Gram Stain   Final    RARE WBC PRESENT,BOTH PMN AND MONONUCLEAR NO ORGANISMS SEEN    Report Status 09/26/2016 FINAL  Final  Fungus Culture Result     Status: None   Collection Time: 09/26/16  9:01 AM  Result Value Ref Range Status   Result 1 Comment   Final    Comment: (NOTE) KOH/Calcofluor preparation:  no fungus observed. Performed At: [facility] [place], [place] [id number] Lindon Romp MD [id number]   Fungus Culture With Stain     Status: None (Preliminary result)   Collection Time: 09/26/16  9:12 AM  Result Value Ref Range Status   Fungus Stain Final report  Final    Comment: (NOTE) Performed At: [facility] [place], [place] [id number] Lindon Romp MD GD:[id number]    Fungus (Mycology) Culture PENDING  Incomplete   Fungal Source TISSUE  Final    Comment: PERICARDIUM PATIENT ON FOLLOWING ZINACEF   Aerobic/Anaerobic Culture (surgical/deep wound)     Status: None   Collection Time: 09/26/16  9:12 AM  Result Value Ref Range Status   Specimen Description TISSUE  Final   Special Requests   Final    PERICARDIUM SPECIMEN C PATIENT ON FOLLOWING ZINACEF   Gram Stain NO WBC SEEN NO ORGANISMS SEEN   Final   Culture NO GROWTH 5 DAYS  Final   Report Status 10/01/2016 FINAL  Final  Acid Fast Smear (AFB)     Status: None   Collection Time: 09/26/16  9:12 AM  Result Value Ref Range Status   AFB Specimen Processing Comment  Final    Comment: Tissue Grinding and Digestion/Decontamination   Acid Fast Smear Negative  Final    Comment: (NOTE) Performed At: [facility] [place], [place] [id number] Lindon Romp MD QJ:[id number]    Source (AFB) TISSUE  Final    Comment: PERICARDIUM PATIENT ON FOLLOWING  ZINACEF   Fungus Culture Result     Status: None   Collection Time:  09/26/16  9:12 AM  Result Value Ref Range Status   Result 1 Comment  Final    Comment: (NOTE) KOH/Calcofluor preparation:  no fungus observed. Performed At: [facility] [place], [place] [id number] Lindon Romp MD QP:[id number]   Fungus Culture With Stain     Status: None (Preliminary result)   Collection Time: 09/26/16  9:26 AM  Result Value Ref  Range Status   Fungus Stain Final report  Final    Comment: (NOTE) Performed At: [facility] [place], [place] [id number] Lindon Romp MD PJ:[id number]    Fungus (Mycology) Culture PENDING  Incomplete   Fungal Source FLUID  Final    Comment: PLEURAL PATIENT ON FOLLOWING ZINACEF   Acid Fast Smear (AFB)     Status: None   Collection Time: 09/26/16  9:26 AM  Result Value Ref Range Status   AFB Specimen Processing Concentration  Final   Acid Fast Smear Negative  Final    Comment: (NOTE) Performed At: [facility] [address] [place], [place] [id number] Lindon Romp MD IO:[id number]    Source (AFB) FLUID  Final    Comment: PLEURAL PATIENT ON FOLLOWING ZINACEF   Culture, body fluid-bottle     Status: None   Collection Time: 09/26/16  9:26 AM  Result Value Ref Range Status   Specimen Description FLUID PLEURAL  Final   Special Requests PATIENT ON FOLLOWING  ZINACEF  Final   Culture NO GROWTH 5 DAYS  Final   Report Status 10/01/2016 FINAL  Final  Gram stain     Status: None   Collection Time: 09/26/16  9:26 AM  Result Value Ref Range Status   Specimen Description FLUID PLEURAL  Final   Special Requests PATIENT ON FOLLOWING  ZINACEF  Final   Gram Stain NO WBC SEEN NO ORGANISMS SEEN   Final   Report Status 09/26/2016 FINAL  Final  Fungus Culture Result     Status: None   Collection Time: 09/26/16  9:26 AM  Result Value Ref Range Status   Result 1 Comment  Final    Comment: (NOTE) KOH/Calcofluor preparation:  no fungus observed. Performed At: [facility] [place], [place] [id number] Lindon Romp MD IW:[id number]      Scheduled Meds: . sodium chloride   Intravenous Once  . atorvastatin  10 mg Oral q1800  . Chlorhexidine Gluconate Cloth  6 each Topical Q0600  . dabigatran  150 mg Oral Q12H  . diltiazem  120 mg Oral Daily  . DULoxetine  60 mg Oral Daily  . feeding supplement (ENSURE ENLIVE)  237 mL  Oral BID BM  . fluticasone  2 spray Each Nare Daily  . folic acid  1 mg Oral Daily  . furosemide  20 mg Intravenous Once  . insulin aspart  0-5 Units Subcutaneous QHS  . insulin aspart  0-9 Units Subcutaneous TID WC  . levalbuterol  1.25 mg Nebulization TID  . metoprolol tartrate  25 mg Oral BID  . montelukast  10 mg Oral QHS  . pantoprazole  40 mg Oral Daily  . polyethylene glycol  17 g Oral BID  . senna-docusate  1 tablet Oral BID  . vitamin C  1,000 mg Oral Daily      LOS: 9 days   Gerlean Ren MD [phone]  On-Call/Text Page:      [email or web address]      password [email or web address]  If 7PM-7AM, please contact night-coverage  [email or web address] Password [email or web address] 10/01/2016, 4:30 PM

## 2016-10-01 NOTE — Progress Notes (Signed)
Carol Buchanan is now out of the ICU.  It is a miracle that the fluid and the biopsy did not show any obvious malignancy.  She is feeling better. She still feels some shortness of breath.  She is back on heparin.  I do not have a lot of confidence in Coumadin with respect to malignancy associated thromboembolic disease. As such, I probably would have her on Pradaxa.  The issue with chronic anticoagulation is that she does have intermittent GI blood loss. As such, I suspect that she may have bleeding. With Pradaxa, there is an FDA approved reversal agent that we can use.  She has a hemoglobin of 8.4. She clearly needs to be transfused. She does not do well with a hemoglobin below 10. I think with a transfusion, she will feel better. I think her heart rate will come down. I think she will feel much less stress and will be able to go home.  She does look better.  On her physical exam, her vital signs are temperature of 97.8. Pulse is 103. Blood pressure 143/69. Her lungs still show some scattered wheezes. She has better breath sounds bilaterally. Cardiac exam tachycardic but regular. There are no murmurs. Abdomen is soft. Extremities shows no clubbing, cyanosis or edema. Neurological exam is nonfocal.  She is calm along way. She had the pericardial tap and not from fluid around the heart. She had a pericardial window. The fluid and biopsy was negative. Her chest tubes are out.  Again, I think that Pradaxa would be a very reasonable anticoagulant for her. I think Coumadin would be very difficult to manage given her chemotherapy. I think with Pradaxa, we can be fairly confident as to the anticoagulation effect. I think this will be effective with her thromboembolic disease given that this is in the context of malignancy.  I would transfuse her 2 units of blood today.  I would think that she should be able to go home in the a.m. Maybe, if she gets her blood early enough today, she might be able to  be discharged this evening.  She's gotten fantastic care from everybody over at [facility]!!  Lattie Haw, MD  Psalms 27:1

## 2016-10-01 NOTE — Care Management Note (Signed)
Case Management Note  Patient Details  Name: Carol Buchanan MRN: [id number] Date of Birth: [birth year]  Subjective/Objective: Pt presented for one month hx worsening SOB. CT angio of chest revealed bilateral segmental and subsegmental pulmonary emboli mild CHF and pericardial effusion. Pt underwent a pericardial window on 09/26/16. Chest Tube to be pulled 10-01-16.                    Action/Plan: Benefits check completed for Pradaxa and will make pt aware of cost. CM will provide pt with 30 day free card. No further needs from CM at this time.   S/W JAMES @ [employer] # [phone]   1.DABIGATRAN CAPSULE 150 MG   COVER- NOT ON FORMULARY   2.PRADAXA 150 MG CAPSULE   COVER- YES  CO-PAY- ZERO DOLLARS  PRIOR APPROVAL- NO    Expected Discharge Date:             Expected Discharge Plan:  Home/Self Care  In-House Referral:  NA  Discharge planning Services  CM Consult, Medication Assistance  Post Acute Care Choice:  NA Choice offered to:  NA  DME Arranged:  N/A DME Agency:  NA  HH Arranged:  NA HH Agency:  NA  Status of Service:  Completed, signed off  If discussed at [place] of Stay Meetings, dates discussed:    Additional Comments:  Bethena Roys, RN 10/01/2016, 1:12 PM

## 2016-10-01 NOTE — Plan of Care (Signed)
Problem: Bowel/Gastric: Goal: Will not experience complications related to bowel motility Outcome: Completed/Met Date Met: 10/01/16 Patient had a normal bowel movement today and is no longer complaining of constipation. Is still receiving stool softeners.

## 2016-10-01 NOTE — Progress Notes (Signed)
      [address]       [place] [address]             [phone]      5 Days Post-Op Procedure(s) (LRB): SUBXYPHOID PERICARDIAL WINDOW (N/A) CHEST TUBE INSERTION (Left) TRANSESOPHAGEAL ECHOCARDIOGRAM (TEE) (N/A)   Subjective:  No complaints. Continues to feel better  Objective: Vital signs in last 24 hours: Temp:  [97.3 F (36.3 C)-98.7 F (37.1 C)] 97.8 F (36.6 C) (03/08 0854) Pulse Rate:  [94-107] 102 (03/08 0857) Cardiac Rhythm: Normal sinus rhythm (03/08 0700) Resp:  [15-34] 16 (03/08 0600) BP: (106-142)/(65-81) 116/69 (03/08 0857) SpO2:  [91 %-100 %] 98 % (03/08 0600) Weight:  [179 lb 14.4 oz (81.6 kg)] 179 lb 14.4 oz (81.6 kg) (03/08 0600)  Intake/Output from previous day: 03/07 0701 - 03/08 0700 In: 389.2 [P.O.:240; I.V.:149.2] Out: 2011 [Urine:1900; Stool:1; Chest Tube:110]  General appearance: alert, cooperative and no distress Heart: regular rate and rhythm Lungs: clear to auscultation bilaterally Wound: clean and dry  Lab Results:  Recent Labs  09/30/16 0354 10/01/16 0342  WBC 10.7* 8.9  HGB 8.9* 8.4*  HCT 28.8* 26.6*  PLT 372 341   BMET:  Recent Labs  09/29/16 0319  NA 138  K 4.5  CL 102  CO2 27  GLUCOSE 120*  BUN 16  CREATININE 1.22*  CALCIUM 8.4*    PT/INR:  Recent Labs  10/01/16 0342  LABPROT 15.0  INR 1.18   ABG    Component Value Date/Time   PHART 7.487 (H) 09/25/2016 1528   HCO3 30.9 (H) 09/25/2016 1528   TCO2 15.0 11/25/2015 2143   ACIDBASEDEF 6.8 (H) 11/25/2015 2143   O2SAT 97.6 09/25/2016 1528   CBG (last 3)   Recent Labs  09/30/16 1623 09/30/16 2022 10/01/16 0732  GLUCAP 117* 146* 98    Assessment/Plan: S/P Procedure(s) (LRB): SUBXYPHOID PERICARDIAL WINDOW (N/A) CHEST TUBE INSERTION (Left) TRANSESOPHAGEAL ECHOCARDIOGRAM (TEE) (N/A)  1. Chets tube- 50 cc output yesterday, no air leak present- will d/c chest tube today 2. Dispo- care per primary, d/c chest tube, repeat CXR in  AM   LOS: 9 days    BARRETT, ERIN 10/01/2016

## 2016-10-01 NOTE — Progress Notes (Signed)
Physical Therapy Treatment Patient Details Name: Carol Buchanan MRN: [id number] DOB: 02/21/1961 Today's Date: 10/01/2016    History of Present Illness Pt adm with worsening SOB and found to have acute PE. Pt developed pericardial effusion with tamponade and underwent pericardial window and chest tube placement. PMH - lung CA, copd    PT Comments    Patient progressing well towards PT goals. Able to maintain Sp02 >90% on 2L/min 02. Continues to demonstrate 3/4 DOE but able to increase activity tolerance. Will attempt ambulation without RW next session and perform higher level balance challenges. Pt eager to return home.  Will follow.   Follow Up Recommendations  No PT follow up     Equipment Recommendations  None recommended by PT    Recommendations for Other Services       Precautions / Restrictions Precautions Precautions: None Precaution Comments: chest tube; 02 Restrictions Weight Bearing Restrictions: No    Mobility  Bed Mobility               General bed mobility comments: Pt up in chair  Transfers Overall transfer level: Needs assistance Equipment used: Rolling walker (2 wheeled) Transfers: Sit to/from Stand Sit to Stand: Supervision         General transfer comment: Supervision for safety due to mulitple lines.  Ambulation/Gait Ambulation/Gait assistance: Supervision Ambulation Distance (Feet): 250 Feet Assistive device: Rolling walker (2 wheeled) Gait Pattern/deviations: Step-through pattern;Decreased stride length Gait velocity: decr   General Gait Details: Slow, steady gait using RW. Sp02 stayed >90% on 2L/min 02. HR up to 115 bpm. 3/4 DOE.   Stairs            Wheelchair Mobility    Modified Rankin (Stroke Patients Only)       Balance Overall balance assessment: Needs assistance Sitting-balance support: Feet supported;No upper extremity supported Sitting balance-Leahy Scale: Good     Standing balance support: During  functional activity Standing balance-Leahy Scale: Fair Standing balance comment: Able to stand statically without UE support but used RW for longer distances.                    Cognition Arousal/Alertness: Awake/alert Behavior During Therapy: WFL for tasks assessed/performed Overall Cognitive Status: Within Functional Limits for tasks assessed                      Exercises      General Comments        Pertinent Vitals/Pain Pain Assessment: No/denies pain    Home Living                      Prior Function            PT Goals (current goals can now be found in the care plan section) Progress towards PT goals: Progressing toward goals    Frequency    Min 3X/week      PT Plan Current plan remains appropriate    Co-evaluation             End of Session Equipment Utilized During Treatment: Oxygen Activity Tolerance: Patient tolerated treatment well Patient left: in chair;with call bell/phone within reach;with chair alarm set Nurse Communication: Mobility status PT Visit Diagnosis: Difficulty in walking, not elsewhere classified (R26.2)     Time: 0942-1000 PT Time Calculation (min) (ACUTE ONLY): 18 min  Charges:  $Therapeutic Exercise: 8-22 mins  G Codes:       Lacie Draft 10/01/2016, 10:37 AM Wray Kearns, PT, DPT [phone]

## 2016-10-01 NOTE — Progress Notes (Signed)
Rolene Course notified of pt's chest tube being removed & the pt tolerated it well however upon removal thick fibrinous tissue came out with the tube. Most of it was removed however some was left under the dressing d/t feeling resistance. Will continue to monitor the pt. Hoover Brunette, RN

## 2016-10-01 NOTE — Progress Notes (Signed)
Progress Note  Patient Name: Carol Buchanan Date of Encounter: 10/01/2016  Primary Cardiologist: Dr. Acie Fredrickson  Subjective   Overall continued improvement, hopefully to have CT out today.  Inpatient Medications    Scheduled Meds: . sodium chloride   Intravenous Once  . atorvastatin  10 mg Oral q1800  . Chlorhexidine Gluconate Cloth  6 each Topical Q0600  . dabigatran  150 mg Oral Q12H  . diltiazem  120 mg Oral Daily  . DULoxetine  60 mg Oral Daily  . feeding supplement (ENSURE ENLIVE)  237 mL Oral BID BM  . fluticasone  2 spray Each Nare Daily  . folic acid  1 mg Oral Daily  . furosemide  20 mg Intravenous Once  . insulin aspart  0-5 Units Subcutaneous QHS  . insulin aspart  0-9 Units Subcutaneous TID WC  . levalbuterol  1.25 mg Nebulization TID  . metoprolol tartrate  25 mg Oral BID  . montelukast  10 mg Oral QHS  . pantoprazole  40 mg Oral Daily  . polyethylene glycol  17 g Oral BID  . senna-docusate  1 tablet Oral BID  . vitamin C  1,000 mg Oral Daily   Continuous Infusions:  PRN Meds: sodium chloride, HYDROcodone-acetaminophen, levalbuterol, LORazepam   Vital Signs    Vitals:   09/30/16 2300 10/01/16 0600 10/01/16 0854 10/01/16 0857  BP: 131/72 (!) 142/69  116/69  Pulse: (!) 103   (!) 102  Resp: (!) 22 16    Temp: 98.1 F (36.7 C) 97.8 F (36.6 C) 97.8 F (36.6 C)   TempSrc: Oral Oral Oral   SpO2: 100% 98%    Weight:  179 lb 14.4 oz (81.6 kg)    Height:        Intake/Output Summary (Last 24 hours) at 10/01/16 0906 Last data filed at 10/01/16 0600  Gross per 24 hour  Intake           116.15 ml  Output             1551 ml  Net         -1434.85 ml   Filed Weights   09/29/16 0454 09/30/16 0400 10/01/16 0600  Weight: 182 lb 8.7 oz (82.8 kg) 180 lb 5.4 oz (81.8 kg) 179 lb 14.4 oz (81.6 kg)     Physical Exam   General: Well developed, well nourished, female appearing in no acute distress. Head: Normocephalic, atraumatic.  Neck: Supple without  bruits, JVD Lungs:  Resp regular and unlabored, Mild wheezing bilaterally. Left pleural drain in place with dressing Heart: Regular rhythm, tachycardic rate, S1, S2, no murmur; no rub, pericardial drain in place with dressing Abdomen: Soft, non-tender, non-distended with normoactive bowel sounds. No hepatomegaly. No rebound/guarding. No obvious abdominal masses. Extremities: No clubbing, cyanosis, No edema. Distal pedal pulses are 2+ bilaterally. Neuro: Alert and oriented X 3. Moves all extremities spontaneously. Psych: Normal affect.  Labs    Chemistry  Recent Labs Lab 09/25/16 1425 09/26/16 0219 09/27/16 0018 09/28/16 0209 09/29/16 0319  NA 135 136 137 136 138  K 3.4* 3.1* 3.4* 3.2* 4.5  CL 93* 94* 94* 97* 102  CO2 '27 30 30 29 27  '$ GLUCOSE 138* 137* 110* 135* 120*  BUN '15 14 10 9 16  '$ CREATININE 1.40* 1.31* 1.36* 1.22* 1.22*  CALCIUM 8.9 8.7* 8.4* 8.1* 8.4*  PROT 6.9 6.3*  --  5.9*  --   ALBUMIN 2.9* 2.6*  --  2.2*  --   AST 36  34  --  22  --   ALT 34 32  --  23  --   ALKPHOS 145* 143*  --  128*  --   BILITOT 0.5 0.4  --  0.8  --   GFRNONAA 41* 45* 43* 49* 49*  GFRAA 48* 52* 49* 56* 56*  ANIONGAP '15 12 13 10 9     '$ Hematology  Recent Labs Lab 09/29/16 0319 09/30/16 0354 10/01/16 0342  WBC 9.8 10.7* 8.9  RBC 2.55* 2.72* 2.52*  HGB 8.6* 8.9* 8.4*  HCT 26.8* 28.8* 26.6*  MCV 105.1* 105.9* 105.6*  MCH 33.7 32.7 33.3  MCHC 32.1 30.9 31.6  RDW 18.9* 19.0* 18.9*  PLT 267 372 341    Cardiac EnzymesNo results for input(s): TROPONINI in the last 168 hours.  No results for input(s): TROPIPOC in the last 168 hours.   BNP No results for input(s): BNP, PROBNP in the last 168 hours.   DDimer No results for input(s): DDIMER in the last 168 hours.   Radiology    Dg Chest Port 1 View  Result Date: 09/30/2016 CLINICAL DATA:  56 year old female with pericardial effusion, pleural effusion status post subxiphoid pericardial window and chest tube placement on 09/26/2016.  Lung cancer. Recent pulmonary emboli. EXAM: PORTABLE CHEST 1 VIEW COMPARISON:  09/28/2016 and earlier. FINDINGS: Portable AP view at 0322 hours. Stable left chest tube. No pneumothorax. Mildly decreased veiling and patchy multifocal opacity in the left lung. Leftward mediastinal shift persists. Stable mediastinal contours. Stable right IJ Port-A-Cath. Stable right lung with patchy medial right lung base opacity. No areas of worsening ventilation. IMPRESSION: 1. Stable left chest tube with no pneumothorax. 2. Mild regression of veiling and patchy multifocal opacity in the left lung since 09/28/2016. 3. No new cardiopulmonary abnormality. Electronically Signed   By: Genevie Ann M.D.   On: 09/30/2016 06:50     Telemetry    Sinus tachycardia 100s - Personally Reviewed  ECG     09/25/16: Sinus tachycardia, 101 bpm  - Personally Reviewed   Cardiac Studies   TEE 09/26/16:  Left ventricle: Normal cavity size, wall thickness, left ventricular diastolic function and left atrial pressure. LV systolic function is low normal with an EF of 50-55%.  Aortic valve: No stenosis. Trace regurgitation.  Mitral valve: Trace regurgitation.  Right ventricle: Normal cavity size, wall thickness and ejection fraction. Mildly compressed by pericardial fluid. This is notably improved after pericardial window procedure.  Tricuspid valve: Mild to moderate regurgitation.  Pericardium: Circumferential pericardial effusion. Effusion size is 1105m.  Patient Profile     56y.o. 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  Presenting with one month hx worsening SOB.  CT angio of chest in ED today showed bilat segmental and subsegmental pulm emboli, no R heart strain, mild CHF, pericardial effusion. She had a pericardial window on 09/26/16.   Assessment & Plan    1. Pericardial effusion - s/p  pericardial window on 3/3, CT surgery following - pt states chest pain has improved  2. Pleural effusion - s/p CT placement,  110 out yesterday.   3. Sinus tachycardia No wheezing on exam today  Increased Metoprolol to 25 mg PO BID   3. HTN - pt is tolerating PO cardizem; BP 110-120 / 60-70s - HR continues in the 100s, likely secondary to  PE and hypoxia - hold home verapamil with AKI  4. AKI - sCr 1.22 (1.36) with good urine output - diuresed 1.9L yesterday, weight continues to trend down.  5. Anemia - Hgb 8.4 today, 2 units of PRBCs per oncology   6. Acute bilateral PE: Oncology following for anticoagulation, placed on Pradaxa    Reino Bellis, NP  10/01/2016 9:06 AM      Attending Note:   The patient was seen and examined.  Agree with assessment and plan as noted above.  Changes made to the above note as needed.  Patient seen and independently examined with Reino Bellis, NP .   We discussed all aspects of the encounter. I agree with the assessment and plan as stated above.  1. Pericardial effusion:   S/p pericardial window .  S/p pericardial drain and pleural drain Doing well. Hopefull home tomorrow  2. Anemia :  Getting transfused per Onc.   3. Pulmonary embolus:   On Pradaxa    I have spent a total of 40 minutes with patient reviewing hospital  notes , telemetry, EKGs, labs and examining patient as well as establishing an assessment and plan that was discussed with the patient. > 50% of time was spent in direct patient care.    Thayer Headings, Brooke Bonito., MD, [facility] 10/01/2016, 11:29 AM [address],  [place] Pager [phone]

## 2016-10-02 ENCOUNTER — Inpatient Hospital Stay (HOSPITAL_COMMUNITY): Payer: Medicare Other

## 2016-10-02 LAB — TYPE AND SCREEN
ABO/RH(D): O POS
ANTIBODY SCREEN: NEGATIVE
UNIT DIVISION: 0
Unit division: 0

## 2016-10-02 LAB — BPAM RBC
BLOOD PRODUCT EXPIRATION DATE: 201804092359
Blood Product Expiration Date: 201804092359
ISSUE DATE / TIME: 201803081208
ISSUE DATE / TIME: 201803081556
UNIT TYPE AND RH: 5100
UNIT TYPE AND RH: 5100

## 2016-10-02 LAB — GLUCOSE, CAPILLARY
GLUCOSE-CAPILLARY: 110 mg/dL — AB (ref 65–99)
GLUCOSE-CAPILLARY: 120 mg/dL — AB (ref 65–99)

## 2016-10-02 LAB — HEMOGLOBIN AND HEMATOCRIT, BLOOD
HCT: 33.6 % — ABNORMAL LOW (ref 36.0–46.0)
HEMOGLOBIN: 10.8 g/dL — AB (ref 12.0–15.0)

## 2016-10-02 MED ORDER — HEPARIN SOD (PORK) LOCK FLUSH 100 UNIT/ML IV SOLN
500.0000 [IU] | INTRAVENOUS | Status: AC | PRN
  Administered 2016-10-02: 500 [IU]

## 2016-10-02 MED ORDER — DABIGATRAN ETEXILATE MESYLATE 150 MG PO CAPS: 150.0000 mg | ORAL_CAPSULE | Freq: Two times a day (BID) | ORAL | 0 refills | Status: DC

## 2016-10-02 MED ORDER — METOPROLOL TARTRATE 25 MG PO TABS: 25.0000 mg | ORAL_TABLET | Freq: Two times a day (BID) | ORAL | 0 refills | Status: DC

## 2016-10-02 MED ORDER — DILTIAZEM HCL ER COATED BEADS 120 MG PO CP24: 120.0000 mg | ORAL_CAPSULE | Freq: Every day | ORAL | 0 refills | Status: DC

## 2016-10-02 NOTE — Discharge Summary (Signed)
Physician Discharge Summary  Carol Buchanan GGE:[id number] DOB: September 04, 1960 DOA: 09/22/2016  PCP: Andria Frames, MD  Admit date: 09/22/2016 Discharge date: 10/02/2016  Admitted From: Home Disposition:  Home  Recommendations for Outpatient Follow-up:  1. Follow up with PCP in 1-2 weeks 2. Please obtain BMP/CBC in one week 3. Please follow-up with cardiothoracic surgery in 2 weeks, who will also take her sutures out 4. Follow-up with cardiology in about 4 weeks 5. Follow up with oncology in about 3-4 weeks 6. Take Pradaxa as prescribed for pulmonary embolism. 7. Continue using home oxygen 8. Metoprolol increased to 25 mg orally twice a day per cardiology 9. Cardizem started and verapamil discontinued due to acute kidney injury.  Home Health: No Equipment/Devices: Continue using home oxygen  Discharge Condition: Stable CODE STATUS: Full Diet recommendation: Heart Healthy / Carb Modified   Brief/Interim Summary: 56 year old female with history of non-small cell lung carcinoma with metastases who was diagnosed in November 2016 and treated with radiation and chemotherapy presented to the ED with complaints of worsening shortness of breath. CTA of the chest was done which revealed bilateral segmental and subsegmental pulmonary embolism without any cardiac strain. Echocardiogram was done which revealed large pericardial effusion and moderate left-sided pleural effusion. Patient ended up having pericardial fluid drainage and thoracentesis. Subxiphoid tube and chest tubes were left in place which drained well over the next couple of days drainage significantly improved. Subxiphoid tube was taken out and she continued to do well. At first heparin drip was started for her PE and later switched to Pradaxa with recommendations from [facility]. Case was discussed with cardiothoracic team who were okay with placing patient on Pradaxa at the time of discharge. Patient did well with physical therapy and  her O2 requirement stayed at 2 L which she does use at home. Today she is deemed medically stable to be discharge and has reached maximum hospital stay benefit. She is given instructions to follow-up with her time in a care, cardiology and oncology. She needs to continue taking Pradaxa as prescribed until further advice. She's been given 30 day prescription at the time of discharge and can be refilled at the time of follow-up from respective positions. She needs to follow-up with cardiothoracic surgery in 2 weeks when her sutures will also be removed. I have advised her if her shortness of breath returns she notifies her physician or return to the ER for further evaluation.  Discharge Diagnoses:  Principal Problem:   Pulmonary embolism (HCC) Active Problems:   Dyspnea   COPD (chronic obstructive pulmonary disease) ([place])   Primary lung cancer with metastasis from lung to other site [facility])   Essential hypertension   Pneumonitis, from immunomodulator   Chronic respiratory failure ([place]), on home O2   Cardiac tamponade   Chest tube in place   Generalized abdominal pain   Surgery, elective  Acute Pulmonary embolism After discussing with oncology and cardiothoracic surgery it was determined patient needs to go home on Pradaxa for anticoagulation. I spoke with the PA on call yesterday who cleared it with Dr Cyndia Bent.   Pericardial effusion w/ tamponade - Pleural effusion  TTE noted physiologic tamponade - s/p pericardial window/drain and chest tube - tube management per TCTS - appears that ouput from CT is declining - pericardial drain now out  Chest tube removed yesterday. Sutures in place for which she'll follow-up in 2 weeks with cardiothoracic surgery.  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 Marin Olp  DM2 CBG well controlled   COPD on home O2 2L [place]   HTN BP stable post-op   Hypokalemia Replaced  Hyperlipidemia on statin    Discharge Instructions   Allergies as of 10/02/2016      Reactions   No Known Allergies       Medication List    STOP taking these medications   verapamil 180 MG (CO) 24 hr tablet Commonly known as:  COVERA HS     TAKE these medications   calcium-vitamin D 500-200 MG-UNIT tablet Take 1 tablet by mouth daily.   dabigatran 150 MG Caps capsule Commonly known as:  PRADAXA Take 1 capsule (150 mg total) by mouth every 12 (twelve) hours.   diltiazem 120 MG 24 hr capsule Commonly known as:  CARDIZEM CD Take 1 capsule (120 mg total) by mouth daily. Start taking on:  10/03/2016   DULoxetine 60 MG capsule Commonly known as:  CYMBALTA Take 60 mg by mouth daily. Reported on 08/06/2015   fluticasone 50 MCG/ACT nasal spray Commonly known as:  FLONASE Place 2 sprays into both nostrils daily.   folic acid 1 MG tablet Commonly known as:  FOLVITE Take 1 tablet (1 mg total) by mouth daily. Start 5-7 days before Alimta chemotherapy. Continue until 21 days after Alimta completed.   glipiZIDE 5 MG tablet Commonly known as:  GLUCOTROL Take 1 tablet (5 mg total) by mouth 2 (two) times daily before a meal. What changed:  when to take this   ipratropium-albuterol 0.5-2.5 (3) MG/3ML Soln Commonly known as:  DUONEB Take 3 mLs by nebulization 3 (three) times daily. What changed:  when to take this   lidocaine-prilocaine cream Commonly known as:  EMLA Apply to affected area once   LORazepam 0.5 MG tablet Commonly known as:  ATIVAN take 1 tablet by mouth every 6  hours if needed for nausea or vomiting   meloxicam 15 MG tablet Commonly known as:  MOBIC Take 15 mg by mouth every other day.   metoprolol tartrate 25 MG tablet Commonly known as:  LOPRESSOR Take 1 tablet (25 mg total) by mouth 2 (two) times daily.   montelukast 10 MG tablet Commonly known as:  SINGULAIR take 1 tablet by mouth at bedtime   omeprazole 20 MG capsule Commonly known as:  PRILOSEC Take 1 capsule (20 mg total) by mouth daily.   ondansetron 4 MG tablet Commonly known as:  ZOFRAN take 1 tablet by mouth once daily   potassium chloride SA 20 MEQ tablet Commonly known as:  K-DUR,KLOR-CON Take 1 tablet (20 mEq total) by mouth daily.   simvastatin 20 MG tablet Commonly known as:  ZOCOR Take 20 mg by mouth daily.   STIOLTO RESPIMAT 2.5-2.5 MCG/ACT Aers Generic drug:  Tiotropium Bromide-Olodaterol inhale 2 puffs INTO THE LUNGS DAILY   triamterene-hydrochlorothiazide 75-50 MG tablet Commonly known as:  MAXZIDE Take 1 tablet by mouth daily.   vitamin C 1000 MG tablet Take 1,000 mg by mouth daily.      Follow-up Information    Tharon Aquas Trigt III, MD Follow  up in 2 week(s).   Specialty:  Cardiothoracic Surgery Why:  Office will contact you with appointment date and time.  Please get CXR at [facility] 30 min prior to your appointment.  This is located on the first floor of our office building Contact information: [place] [address] [phone]        Andria Frames, MD Follow up in 1 week(s).   Specialty:  Family Medicine Contact information: [place] [address] [phone]        Mertie Moores, MD. Call in 1 month(s).   Specialty:  Cardiology Contact information: [address] [place] [address] [phone]        Volanda Napoleon, MD Follow up in 3 week(s).   Specialty:  Oncology Contact information: [place]  [address] [phone]          Allergies  Allergen Reactions  . No Known Allergies     Consultations:  Cardiothoracic surgery  Cardiologist   Procedures/Studies: Dg Chest 2 View  Result Date: 10/02/2016 CLINICAL DATA:  Chest tube removal. EXAM: CHEST  2 VIEW COMPARISON:  09/30/2016 FINDINGS: Right-sided Port-A-Cath terminates at the superior caval/ atrial junction. Mild tracheal deviation to the left secondary to volume loss in the left hemithorax. Removal of left chest tube. No apical pneumothorax identified. Suspect trace inferolateral left pleural air. Similar small left pleural effusion. Clear right lung. Improved left-sided aeration with left perihilar and basilar opacities remaining. IMPRESSION: Removal of left chest tube with suspicion of trace inferolateral left pleural air. Similar left pleural effusion with improved left-sided aeration. Electronically Signed   By: Abigail Miyamoto M.D.   On: 10/02/2016 09:16   Dg Chest 2 View  Result Date: 09/22/2016 CLINICAL DATA:  Shortness of breath.  History of lung carcinoma EXAM: CHEST  2 VIEW COMPARISON:  Chest radiograph Dec 24, 2015 and PET-CT July 06, 2016 FINDINGS: There is loculated pleural effusion on the left with atelectasis in left base. There is consolidation in the left upper lobe posterior segment as well as in portions of the left lower lobe medially. On the right, there is atelectatic change in the medial right base region. Lungs elsewhere clear. There is mild cardiomegaly with pulmonary vascularity within normal limits. No adenopathy. No bone lesions appreciable. Port-A-Cath tip at cavoatrial junction. No pneumothorax. IMPRESSION: Areas of consolidation on the left, primarily in the left upper lobe, posterior segment. Loculated pleural effusion on the left with atelectatic change. Atelectasis right perihilar region. Stable cardiomegaly. No adenopathy by radiography apparent. No pneumothorax. Electronically Signed   By: Lowella Grip III M.D.   On: 09/22/2016 15:47   Dg Abd 1 View  Result Date: 09/23/2016 CLINICAL DATA:  Lower abdominal pain EXAM: ABDOMEN - 1 VIEW COMPARISON:  PET-CT 07/06/2016 FINDINGS: Degenerative disc disease L2-3 with L4-5 right-sided facet arthropathy and sclerosis. Bowel gas pattern is unremarkable. There is no free air, bowel obstruction, organomegaly nor suspicious calculi. IMPRESSION: Unremarkable bowel gas pattern. Degenerative disc and facet arthropathy of the lumbar spine. Electronically Signed   By: Ashley Royalty M.D.   On: 09/23/2016 20:06   Ct Angio Chest Pe W And/or Wo Contrast  Result Date: 09/22/2016 CLINICAL DATA:  56 year old female with lung cancer. Patient is on chemotherapy with last treatment 8 days ago. Diagnosed with anemia last week beginning transfusion. Worsening of dyspnea. History of pericardial effusion without physiologic effect. EXAM: CT ANGIOGRAPHY CHEST WITH CONTRAST  TECHNIQUE: Multidetector CT imaging of the chest was performed using the standard protocol during bolus administration of intravenous contrast. Multiplanar CT image reconstructions and MIPs were obtained to evaluate the vascular anatomy. CONTRAST:  65 cc Isovue 370 COMPARISON:  Chest CT 11/25/2015, CXR 09/22/2016 FINDINGS: Cardiovascular: Acute bilateral lower lobe segmental and subsegmental nonocclusive pulmonary emboli without right heart strain. RV/ LV ratio equals 0.67. Cardiomegaly with circumferential pericardial effusion measuring up to 3.1 cm in thickness no aortic aneurysm or dissection. Coronary arteriosclerosis. Port catheter tip projects within the proximal right atrium. Aberrant right subclavian artery. Mediastinum/Nodes: No mediastinal lymphadenopathy. No axillary or supraclavicular adenopathy. Calcified right 1.7 cm thyroid nodule. Lungs/Pleura: Moderate loculated left pleural effusion with adjacent areas of pulmonary atelectasis and consolidation. Small right pleural effusion. Left hilar masslike  opacity noted on prior exam is obscured by adjacent atelectasis and pleural fluid centrilobular emphysema is identified upper lobe predominant. Hazy ground-glass opacities of the lungs may reflect sequela of CHF. Upper Abdomen: No acute abnormality. Musculoskeletal: Sclerotic appearance of the T4 vertebral body concerning for osseous metastatic disease. Degenerative disc space narrowing from T3 through T8 with small posterior marginal osteophytes at T5-6 and T6-7. Slightly sclerotic appearance of the C6 and C7 vertebral bodies are also noted. Review of the MIP images confirms the above findings. IMPRESSION: 1. Bilateral nonocclusive segmental and subsegmental pulmonary emboli without right heart strain. RV/LV ratio equals 0.67. 2. Loculated left pleural effusion with adjacent compressive atelectasis. Mild CHF. 3. Cardiomegaly with large circumferential pericardial effusion measuring up to 3.1 cm in thickness. 4. Sclerotic appearance of several visualized lower cervical and mid thoracic vertebrae concerning for osteoblastic disease in particular T4. The other areas of sclerosis may be due to degenerative disc disease and discogenic sclerosis. Mixed sclerotic and lytic appearance of the right sternum at the sternoclavicular joint is more likely related to osteoarthritic degenerative change. 5. Critical Value/emergent results were called by telephone at the time of interpretation on 09/22/2016 at 6:01 pm to Dr. Zenovia Jarred , who verbally acknowledged these results. Electronically Signed   By: Ashley Royalty M.D.   On: 09/22/2016 18:01   Dg Chest Port 1 View  Result Date: 09/30/2016 CLINICAL DATA:  56 year old female with pericardial effusion, pleural effusion status post subxiphoid pericardial window and chest tube placement on 09/26/2016. Lung cancer. Recent pulmonary emboli. EXAM: PORTABLE CHEST 1 VIEW COMPARISON:  09/28/2016 and earlier. FINDINGS: Portable AP view at 0322 hours. Stable left chest tube. No  pneumothorax. Mildly decreased veiling and patchy multifocal opacity in the left lung. Leftward mediastinal shift persists. Stable mediastinal contours. Stable right IJ Port-A-Cath. Stable right lung with patchy medial right lung base opacity. No areas of worsening ventilation. IMPRESSION: 1. Stable left chest tube with no pneumothorax. 2. Mild regression of veiling and patchy multifocal opacity in the left lung since 09/28/2016. 3. No new cardiopulmonary abnormality. Electronically Signed   By: Genevie Ann M.D.   On: 09/30/2016 06:50   Dg Chest Port 1 View  Result Date: 09/28/2016 CLINICAL DATA:  Pericardial effusion. Left pleural effusion. Recent pulmonary emboli. EXAM: PORTABLE CHEST 1 VIEW COMPARISON:  Chest x-rays dated 09/27/2016 and 09/26/2016 and chest CT dated 09/22/2016 FINDINGS: Power port and left chest tube remain in good position. Small loculated left effusion persists. Left perihilar scarring from previous radiation therapy for non-small cell carcinoma of the lung. The right lung is clear.  Pulmonary vascularity is normal. IMPRESSION: No significant change since the prior study. Small amount of residual loculated fluid on the left  superimposed on chronic scarring from prior radiation therapy. Electronically Signed   By: Lorriane Shire M.D.   On: 09/28/2016 07:22   Dg Chest Port 1 View  Result Date: 09/27/2016 CLINICAL DATA:  Chest tube in place. EXAM: PORTABLE CHEST 1 VIEW COMPARISON:  Radiograph of September 26, 2016. FINDINGS: Stable cardiomediastinal silhouette. Endotracheal tube has been removed. Left-sided chest tube is unchanged in position. No definite pneumothorax is noted. Left perihilar and basilar opacity is noted concerning for pneumonia, tumor or atelectasis with associated pleural effusion. Right internal jugular Port-A-Cath is unchanged. Mild right basilar subsegmental atelectasis is noted. Bony thorax is unremarkable. IMPRESSION: Stable position of left-sided chest tube without definite  pneumothorax. Left perihilar and basilar opacity is noted concerning for atelectasis, pneumonia or tumor with associated pleural effusion. Electronically Signed   By: Marijo Conception, M.D.   On: 09/27/2016 07:19   Dg Chest Portable 1 View  Result Date: 09/26/2016 CLINICAL DATA:  Elective surgery.  Lung cancer. EXAM: PORTABLE CHEST 1 VIEW COMPARISON:  Chest radiograph from earlier today. FINDINGS: Endotracheal tube terminates at the level of the carina over the origin of the right mainstem bronchus. Right internal jugular MediPort terminates in the right atrium. Left apical chest tube and pericardial drain overlying the left lung base are in place. Top-normal heart size, with interval drainage of pericardial effusion. Otherwise stable mediastinal contour. Small peripheral basilar left hydropneumothorax. No right pneumothorax. No right pleural effusion. Patchy opacity throughout the parahilar and basilar left lung appears stable. IMPRESSION: 1. Small peripheral basilar left hydropneumothorax. Well-positioned left apical chest tube. 2. Interval drainage of pericardial effusion with top-normal heart size and pericardial drain in place. 3. Low lying endotracheal tube at the level of the carina overlying the right mainstem bronchus origin, recommend retraction. 4. Stable patchy opacity throughout the parahilar and basilar left lung, probably a combination of tumor, atelectasis and/or posttreatment change. Critical Value/emergent results were called by telephone at the time of interpretation on 09/26/2016 at 10:04 am to Dr. Ivin Poot , who verbally acknowledged these results. Electronically Signed   By: Ilona Sorrel M.D.   On: 09/26/2016 10:07   Dg Chest Port 1 View  Result Date: 09/26/2016 CLINICAL DATA:  Shortness of breath. EXAM: PORTABLE CHEST 1 VIEW COMPARISON:  Radiograph of September 25, 2016. FINDINGS: Stable cardiomegaly. Right internal jugular Port-A-Cath is unchanged in position. No pneumothorax is noted.  Right lung is clear. Stable left perihilar and basilar opacity is noted concerning for pneumonia or atelectasis with associated pleural effusion. Bony thorax is unremarkable. IMPRESSION: Stable left lung opacities as described above. Electronically Signed   By: Marijo Conception, M.D.   On: 09/26/2016 07:36   Dg Chest Port 1 View  Result Date: 09/25/2016 CLINICAL DATA:  Pericarditis. EXAM: PORTABLE CHEST 1 VIEW COMPARISON:  CT 05/22/2017.  Chest x-ray 09/22/2016 . FINDINGS: PowerPort catheter noted with tip projected over right atrium in stable position. Stable cardiomegaly. Stable perihilar atelectasis/ infiltrates and left-sided pleural effusion. No pneumothorax. No acute bony abnormality identified. Reference is made to CT report for discussion of possible bony lesions present. IMPRESSION: 1. PowerPort catheter stable position. 2. Stable perihilar atelectasis/ infiltrates and left-sided pleural effusion. No interim improvement. Electronically Signed   By: Marcello Moores  Register   On: 09/25/2016 07:32       Subjective:   Discharge Exam: Vitals:   10/02/16 0835 10/02/16 0911  BP: 122/71 126/73  Pulse: (!) 101   Resp:    Temp: 97.5 F (36.4 C)  Vitals:   10/01/16 2300 10/02/16 0300 10/02/16 0835 10/02/16 0911  BP: 119/75 119/73 122/71 126/73  Pulse: 88 96 (!) 101   Resp: 19     Temp: 98.1 F (36.7 C) 98.3 F (36.8 C) 97.5 F (36.4 C)   TempSrc: Oral Axillary Oral   SpO2: 100% 100% 98%   Weight:  79.9 kg (176 lb 1.6 oz)    Height:        General: Pt is alert, awake, not in acute distress Cardiovascular: RRR, S1/S2 +, no rubs, no gallops Respiratory: CTA bilaterally, no wheezing, no rhonchi Abdominal: Soft, NT, ND, bowel sounds + Extremities: no edema, no cyanosis    The results of significant diagnostics from this hospitalization (including imaging, microbiology, ancillary and laboratory) are listed below for reference.     Microbiology: Recent Results (from the past 240  hour(s))  MRSA PCR Screening     Status: None   Collection Time: 09/24/16  2:53 PM  Result Value Ref Range Status   MRSA by PCR NEGATIVE NEGATIVE Final    Comment:        The GeneXpert MRSA Assay (FDA approved for NASAL specimens only), is one component of a comprehensive MRSA colonization surveillance program. It is not intended to diagnose MRSA infection nor to guide or monitor treatment for MRSA infections.   Surgical pcr screen     Status: None   Collection Time: 09/24/16  4:49 PM  Result Value Ref Range Status   MRSA, PCR NEGATIVE NEGATIVE Final   Staphylococcus aureus NEGATIVE NEGATIVE Final    Comment:        The Xpert SA Assay (FDA approved for NASAL specimens in patients over 27 years of age), is one component of a comprehensive surveillance program.  Test performance has been validated by [facility] for patients greater than or equal to 62 year old. It is not intended to diagnose infection nor to guide or monitor treatment.   Fungus Culture With Stain     Status: None (Preliminary result)   Collection Time: 09/26/16  9:01 AM  Result Value Ref Range Status   Fungus Stain Final report  Final    Comment: (NOTE) Performed At: [facility] [place], [place] [id number] Lindon Romp MD QP:[id number]    Fungus (Mycology) Culture PENDING  Incomplete   Fungal Source FLUID  Final    Comment: PERICARDIAL PATIENT ON FOLLOWING  ZINACEF   Acid Fast Smear (AFB)     Status: None   Collection Time: 09/26/16  9:01 AM  Result Value Ref Range Status   AFB Specimen Processing Concentration  Final   Acid Fast Smear Negative  Final    Comment: (NOTE) Performed At: [facility] [address] [place], [place] [id number] Lindon Romp MD [id number]    Source (AFB) FLUID  Final    Comment: PERICARDIAL PATIENT ON FOLLOWING ZINACEF   Culture, body fluid-bottle     Status: None   Collection Time: 09/26/16  9:01 AM  Result Value  Ref Range Status   Specimen Description FLUID PERICARDIAL  Final   Special Requests PATIENT ON FOLLOWING ZINACEF  Final   Culture NO GROWTH 5 DAYS  Final   Report Status 10/01/2016 FINAL  Final  Gram stain     Status: None   Collection Time: 09/26/16  9:01 AM  Result Value Ref Range Status   Specimen Description FLUID PERICARDIAL  Final   Special Requests PATIENT ON FOLLOWING  ZINACEF  Final  Gram Stain   Final    RARE WBC PRESENT,BOTH PMN AND MONONUCLEAR NO ORGANISMS SEEN    Report Status 09/26/2016 FINAL  Final  Fungus Culture Result     Status: None   Collection Time: 09/26/16  9:01 AM  Result Value Ref Range Status   Result 1 Comment  Final    Comment: (NOTE) KOH/Calcofluor preparation:  no fungus observed. Performed At: [facility] [place], [place] [id number] Lindon Romp MD [id number]   Fungus Culture With Stain     Status: None (Preliminary result)   Collection Time: 09/26/16  9:12 AM  Result Value Ref Range Status   Fungus Stain Final report  Final    Comment: (NOTE) Performed At: [facility] [place], [place] [id number] Lindon Romp MD [id number]    Fungus (Mycology) Culture PENDING  Incomplete   Fungal Source TISSUE  Final    Comment: PERICARDIUM PATIENT ON FOLLOWING ZINACEF   Aerobic/Anaerobic Culture (surgical/deep wound)     Status: None   Collection Time: 09/26/16  9:12 AM  Result Value Ref Range Status   Specimen Description TISSUE  Final   Special Requests   Final    PERICARDIUM SPECIMEN C PATIENT ON FOLLOWING ZINACEF   Gram Stain NO WBC SEEN NO ORGANISMS SEEN   Final   Culture NO GROWTH 5 DAYS  Final   Report Status 10/01/2016 FINAL  Final  Acid Fast Smear (AFB)     Status: None   Collection Time: 09/26/16  9:12 AM  Result Value Ref Range Status   AFB Specimen Processing Comment  Final    Comment: Tissue Grinding and Digestion/Decontamination   Acid Fast Smear Negative  Final     Comment: (NOTE) Performed At: [facility] [place], [place] [id number] Lindon Romp MD ST:[id number]    Source (AFB) TISSUE  Final    Comment: PERICARDIUM PATIENT ON FOLLOWING  ZINACEF   Fungus Culture Result     Status: None   Collection Time: 09/26/16  9:12 AM  Result Value Ref Range Status   Result 1 Comment  Final    Comment: (NOTE) KOH/Calcofluor preparation:  no fungus observed. Performed At: [facility] [place], [place] [id number] Lindon Romp MD [id number]   Fungus Culture With Stain     Status: None (Preliminary result)   Collection Time: 09/26/16  9:26 AM  Result Value Ref Range Status   Fungus Stain Final report  Final    Comment: (NOTE) Performed At: [facility] [place], [place] [id number] Lindon Romp MD [id number]    Fungus (Mycology) Culture PENDING  Incomplete   Fungal Source FLUID  Final    Comment: PLEURAL PATIENT ON FOLLOWING ZINACEF   Acid Fast Smear (AFB)     Status: None   Collection Time: 09/26/16  9:26 AM  Result Value Ref Range Status   AFB Specimen Processing Concentration  Final   Acid Fast Smear Negative  Final    Comment: (NOTE) Performed At: [facility] [address] [place], [place] [id number] Lindon Romp MD NO:[id number]    Source (AFB) FLUID  Final    Comment: PLEURAL PATIENT ON FOLLOWING ZINACEF   Culture, body fluid-bottle     Status: None   Collection Time: 09/26/16  9:26 AM  Result Value Ref Range Status   Specimen Description FLUID PLEURAL  Final   Special Requests PATIENT ON FOLLOWING  ZINACEF  Final  Culture NO GROWTH 5 DAYS  Final   Report Status 10/01/2016 FINAL  Final  Gram stain     Status: None   Collection Time: 09/26/16  9:26 AM  Result Value Ref Range Status   Specimen Description FLUID PLEURAL  Final   Special Requests PATIENT ON FOLLOWING  ZINACEF  Final   Gram Stain NO WBC SEEN NO ORGANISMS SEEN    Final   Report Status 09/26/2016 FINAL  Final  Fungus Culture Result     Status: None   Collection Time: 09/26/16  9:26 AM  Result Value Ref Range Status   Result 1 Comment  Final    Comment: (NOTE) KOH/Calcofluor preparation:  no fungus observed. Performed At: [facility] [place], [place] [id number] Lindon Romp MD QZ:[id number]      Labs: BNP (last 3 results)  Recent Labs  09/22/16 1636  BNP 35.4   Basic Metabolic Panel:  Recent Labs Lab 09/25/16 1425 09/26/16 0219 09/27/16 0018 09/28/16 0209 09/29/16 0319  NA 135 136 137 136 138  K 3.4* 3.1* 3.4* 3.2* 4.5  CL 93* 94* 94* 97* 102  CO2 '27 30 30 29 27  '$ GLUCOSE 138* 137* 110* 135* 120*  BUN '15 14 10 9 16  '$ CREATININE 1.40* 1.31* 1.36* 1.22* 1.22*  CALCIUM 8.9 8.7* 8.4* 8.1* 8.4*  MG  --   --   --   --  1.6*   Liver Function Tests:  Recent Labs Lab 09/25/16 1425 09/26/16 0219 09/28/16 0209  AST 36 34 22  ALT 34 32 23  ALKPHOS 145* 143* 128*  BILITOT 0.5 0.4 0.8  PROT 6.9 6.3* 5.9*  ALBUMIN 2.9* 2.6* 2.2*   No results for input(s): LIPASE, AMYLASE in the last 168 hours. No results for input(s): AMMONIA in the last 168 hours. CBC:  Recent Labs Lab 09/27/16 0018 09/28/16 0209 09/29/16 0319 09/30/16 0354 10/01/16 0342 10/02/16 0455  WBC 6.1 9.0 9.8 10.7* 8.9  --   HGB 9.1* 9.0* 8.6* 8.9* 8.4* 10.8*  HCT 27.8* 27.8* 26.8* 28.8* 26.6* 33.6*  MCV 102.6* 102.6* 105.1* 105.9* 105.6*  --   PLT 195 228 267 372 341  --    Cardiac Enzymes: No results for input(s): CKTOTAL, CKMB, CKMBINDEX, TROPONINI in the last 168 hours. BNP: Invalid input(s): POCBNP CBG:  Recent Labs Lab 10/01/16 1142 10/01/16 1626 10/01/16 2226 10/02/16 0741 10/02/16 1116  GLUCAP 103* 94 98 110* 120*   D-Dimer No results for input(s): DDIMER in the last 72 hours. Hgb A1c No results for input(s): HGBA1C in the last 72 hours. Lipid Profile No results for input(s): CHOL, HDL, LDLCALC, TRIG,  CHOLHDL, LDLDIRECT in the last 72 hours. Thyroid function studies No results for input(s): TSH, T4TOTAL, T3FREE, THYROIDAB in the last 72 hours.  Invalid input(s): FREET3 Anemia work up No results for input(s): VITAMINB12, FOLATE, FERRITIN, TIBC, IRON, RETICCTPCT in the last 72 hours. Urinalysis    Component Value Date/Time   COLORURINE YELLOW 09/25/2016 1331   APPEARANCEUR CLEAR 09/25/2016 1331   LABSPEC 1.012 09/25/2016 1331   PHURINE 6.0 09/25/2016 1331   GLUCOSEU NEGATIVE 09/25/2016 1331   HGBUR NEGATIVE 09/25/2016 1331   BILIRUBINUR NEGATIVE 09/25/2016 1331   KETONESUR NEGATIVE 09/25/2016 1331   PROTEINUR 30 (A) 09/25/2016 1331   NITRITE NEGATIVE 09/25/2016 1331   LEUKOCYTESUR NEGATIVE 09/25/2016 1331   Sepsis Labs Invalid input(s): PROCALCITONIN,  WBC,  LACTICIDVEN Microbiology Recent Results (from the past 240 hour(s))  MRSA PCR Screening  Status: None   Collection Time: 09/24/16  2:53 PM  Result Value Ref Range Status   MRSA by PCR NEGATIVE NEGATIVE Final    Comment:        The GeneXpert MRSA Assay (FDA approved for NASAL specimens only), is one component of a comprehensive MRSA colonization surveillance program. It is not intended to diagnose MRSA infection nor to guide or monitor treatment for MRSA infections.   Surgical pcr screen     Status: None   Collection Time: 09/24/16  4:49 PM  Result Value Ref Range Status   MRSA, PCR NEGATIVE NEGATIVE Final   Staphylococcus aureus NEGATIVE NEGATIVE Final    Comment:        The Xpert SA Assay (FDA approved for NASAL specimens in patients over 47 years of age), is one component of a comprehensive surveillance program.  Test performance has been validated by [facility] for patients greater than or equal to 43 year old. It is not intended to diagnose infection nor to guide or monitor treatment.   Fungus Culture With Stain     Status: None (Preliminary result)   Collection Time: 09/26/16  9:01 AM  Result  Value Ref Range Status   Fungus Stain Final report  Final    Comment: (NOTE) Performed At: [facility] [place], [place] [id number] Lindon Romp MD IP:[id number]    Fungus (Mycology) Culture PENDING  Incomplete   Fungal Source FLUID  Final    Comment: PERICARDIAL PATIENT ON FOLLOWING  ZINACEF   Acid Fast Smear (AFB)     Status: None   Collection Time: 09/26/16  9:01 AM  Result Value Ref Range Status   AFB Specimen Processing Concentration  Final   Acid Fast Smear Negative  Final    Comment: (NOTE) Performed At: [facility] [address] [place], [place] [id number] Lindon Romp MD WI:[id number]    Source (AFB) FLUID  Final    Comment: PERICARDIAL PATIENT ON FOLLOWING ZINACEF   Culture, body fluid-bottle     Status: None   Collection Time: 09/26/16  9:01 AM  Result Value Ref Range Status   Specimen Description FLUID PERICARDIAL  Final   Special Requests PATIENT ON FOLLOWING ZINACEF  Final   Culture NO GROWTH 5 DAYS  Final   Report Status 10/01/2016 FINAL  Final  Gram stain     Status: None   Collection Time: 09/26/16  9:01 AM  Result Value Ref Range Status   Specimen Description FLUID PERICARDIAL  Final   Special Requests PATIENT ON FOLLOWING  ZINACEF  Final   Gram Stain   Final    RARE WBC PRESENT,BOTH PMN AND MONONUCLEAR NO ORGANISMS SEEN    Report Status 09/26/2016 FINAL  Final  Fungus Culture Result     Status: None   Collection Time: 09/26/16  9:01 AM  Result Value Ref Range Status   Result 1 Comment  Final    Comment: (NOTE) KOH/Calcofluor preparation:  no fungus observed. Performed At: [facility] [place], [place] [id number] Lindon Romp MD QI:[id number]   Fungus Culture With Stain     Status: None (Preliminary result)   Collection Time: 09/26/16  9:12 AM  Result Value Ref Range Status   Fungus Stain Final report  Final    Comment: (NOTE) Performed At: [facility] [place], [place] [id number] Lindon Romp MD YJ:[id number]    Fungus (Mycology) Culture PENDING  Incomplete   Fungal  Source TISSUE  Final    Comment: PERICARDIUM PATIENT ON FOLLOWING ZINACEF   Aerobic/Anaerobic Culture (surgical/deep wound)     Status: None   Collection Time: 09/26/16  9:12 AM  Result Value Ref Range Status   Specimen Description TISSUE  Final   Special Requests   Final    PERICARDIUM SPECIMEN C PATIENT ON FOLLOWING ZINACEF   Gram Stain NO WBC SEEN NO ORGANISMS SEEN   Final   Culture NO GROWTH 5 DAYS  Final   Report Status 10/01/2016 FINAL  Final  Acid Fast Smear (AFB)     Status: None   Collection Time: 09/26/16  9:12 AM  Result Value Ref Range Status   AFB Specimen Processing Comment  Final    Comment: Tissue Grinding and Digestion/Decontamination   Acid Fast Smear Negative  Final    Comment: (NOTE) Performed At: [facility] [place], [place] [id number] Lindon Romp MD CX:[id number]    Source (AFB) TISSUE  Final    Comment: PERICARDIUM PATIENT ON FOLLOWING  ZINACEF   Fungus Culture Result     Status: None   Collection Time: 09/26/16  9:12 AM  Result Value Ref Range Status   Result 1 Comment  Final    Comment: (NOTE) KOH/Calcofluor preparation:  no fungus observed. Performed At: [facility] [place], [place] [id number] Lindon Romp MD YI:[id number]   Fungus Culture With Stain     Status: None (Preliminary result)   Collection Time: 09/26/16  9:26 AM  Result Value Ref Range Status   Fungus Stain Final report  Final    Comment: (NOTE) Performed At: [facility] [place], [place] [id number] Lindon Romp MD BS:[id number]    Fungus (Mycology) Culture PENDING  Incomplete   Fungal Source FLUID  Final    Comment: PLEURAL PATIENT ON FOLLOWING ZINACEF   Acid Fast Smear (AFB)     Status: None   Collection Time: 09/26/16  9:26 AM  Result Value Ref Range Status    AFB Specimen Processing Concentration  Final   Acid Fast Smear Negative  Final    Comment: (NOTE) Performed At: [facility] [address] [place], [place] [id number] Lindon Romp MD WS:[id number]    Source (AFB) FLUID  Final    Comment: PLEURAL PATIENT ON FOLLOWING ZINACEF   Culture, body fluid-bottle     Status: None   Collection Time: 09/26/16  9:26 AM  Result Value Ref Range Status   Specimen Description FLUID PLEURAL  Final   Special Requests PATIENT ON FOLLOWING  ZINACEF  Final   Culture NO GROWTH 5 DAYS  Final   Report Status 10/01/2016 FINAL  Final  Gram stain     Status: None   Collection Time: 09/26/16  9:26 AM  Result Value Ref Range Status   Specimen Description FLUID PLEURAL  Final   Special Requests PATIENT ON FOLLOWING  ZINACEF  Final   Gram Stain NO WBC SEEN NO ORGANISMS SEEN   Final   Report Status 09/26/2016 FINAL  Final  Fungus Culture Result     Status: None   Collection Time: 09/26/16  9:26 AM  Result Value Ref Range Status   Result 1 Comment  Final    Comment: (NOTE) KOH/Calcofluor preparation:  no fungus observed. Performed At: [facility] [place], [place] [id number] Lindon Romp MD PR:[id number]      Time coordinating discharge: Over 30 minutes  SIGNED:  Ankit Arsenio Loader, MD  [facility] 10/02/2016, 12:14 PM Pager   If 7PM-7AM, please contact night-coverage [email or web address] Password [email or web address]

## 2016-10-02 NOTE — Progress Notes (Signed)
      [address]       [place] [address]             [phone]      6 Days Post-Op Procedure(s) (LRB): SUBXYPHOID PERICARDIAL WINDOW (N/A) CHEST TUBE INSERTION (Left) TRANSESOPHAGEAL ECHOCARDIOGRAM (TEE) (N/A)   Subjective:  No complaints.  Hoping to go home.  Objective: Vital signs in last 24 hours: Temp:  [97.4 F (36.3 C)-98.7 F (37.1 C)] 98.3 F (36.8 C) (03/09 0300) Pulse Rate:  [88-102] 96 (03/09 0300) Cardiac Rhythm: Sinus tachycardia (03/09 0700) Resp:  [17-21] 19 (03/08 2300) BP: (113-132)/(69-78) 119/73 (03/09 0300) SpO2:  [98 %-100 %] 100 % (03/09 0300) Weight:  [176 lb 1.6 oz (79.9 kg)] 176 lb 1.6 oz (79.9 kg) (03/09 0300)  Intake/Output from previous day: 03/08 0701 - 03/09 0700 In: 945 [P.O.:240; I.V.:10; Blood:695] Out: 1250 [Urine:1250]  General appearance: alert, cooperative and no distress Heart: regular rate and rhythm Lungs: diminished breath sounds bibasilar Abdomen: soft, non-tender; bowel sounds normal; no masses,  no organomegaly Extremities: extremities normal, atraumatic, no cyanosis or edema Wound: clean and dry  Lab Results:  Recent Labs  09/30/16 0354 10/01/16 0342 10/02/16 0455  WBC 10.7* 8.9  --   HGB 8.9* 8.4* 10.8*  HCT 28.8* 26.6* 33.6*  PLT 372 341  --    BMET: No results for input(s): NA, K, CL, CO2, GLUCOSE, BUN, CREATININE, CALCIUM in the last 72 hours.  PT/INR:  Recent Labs  10/01/16 0342  LABPROT 15.0  INR 1.18   ABG    Component Value Date/Time   PHART 7.487 (H) 09/25/2016 1528   HCO3 30.9 (H) 09/25/2016 1528   TCO2 15.0 11/25/2015 2143   ACIDBASEDEF 6.8 (H) 11/25/2015 2143   O2SAT 97.6 09/25/2016 1528   CBG (last 3)   Recent Labs  10/01/16 1626 10/01/16 2226 10/02/16 0741  GLUCAP 94 98 110*    Assessment/Plan: S/P Procedure(s) (LRB): SUBXYPHOID PERICARDIAL WINDOW (N/A) CHEST TUBE INSERTION (Left) TRANSESOPHAGEAL ECHOCARDIOGRAM (TEE) (N/A)  1. Pulm- Chest tube removed  yesterday, no pneumthorax, small residual pleural effusion 2. Incisions- healing well, chest tube suture will be removed at office 3. Dispo- will have patient follow up with PVT in 2 weeks   LOS: 10 days    Ahmed Prima, Junie Panning 10/02/2016

## 2016-10-02 NOTE — Progress Notes (Addendum)
[facility] TEAM 1 - Stepdown/ICU TEAM  Carol Buchanan  BJY:[id number] DOB: 10/21/1960 DOA: 09/22/2016 PCP: Andria Frames, MD    Brief Narrative:  56 y.o. female with history of [place] Lung CA dx'd in Nov 2016. Was treated with XRT initially and has been on chemoRx since then per Dr Marin Olp. Has had mets to rib and spine. Presented with one month hx worsening SOB. CT angio of chest in ED noted bilat segmental and subsegmental pulm emboli w/ no R heart strain.  Subjective: Patient states she feels great today and does not any complaints. She did well with physical therapy yesterday. Her appetite remains good and wants to go home today. Assessment & Plan:  Acute Pulmonary embolism After discussing with oncology and cardiothoracic surgery it was determined patient needs to go home on Pradaxa for anticoagulation. I spoke with the PA on call yesterday who cleared it with Dr Cyndia Bent.   Pericardial effusion w/ tamponade - Pleural effusion  TTE noted physiologic tamponade - s/p pericardial window/drain and chest tube - tube management per TCTS - appears that ouput from CT is declining - pericardial drain now out  Chest tube removed yesterday. Sutures in place for which she'll follow-up in 2 weeks with cardiothoracic surgery.  Acute kidney injury on chronic kidney disease stage III-improved Baseline creatinine appears to be approximately 1.4 - avoid diuresis, contrast, ACEi/ARB - renal fxn stable at baseline  Recent Labs Lab 09/25/16 1425 09/26/16 0219 09/27/16 0018 09/28/16 0209 09/29/16 0319  CREATININE 1.40* 1.31* 1.36* 1.22* 1.22*    Pancytopenia Due to active chemotherapy  Chronic diastolic heart failure Baseline weight appears to be approximately 83 kg - net negative ~6 L since admission - no signif overload at this time  Filed Weights   09/30/16 0400 10/01/16 0600 10/02/16 0300  Weight: 81.8 kg (180 lb 5.4 oz) 81.6 kg (179 lb 14.4 oz) 79.9 kg (176 lb 1.6 oz)    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  Consultants:  TCTS PCCM Cardiology  Procedures: 3/1 TTE - EF 55-60 percent with no regional wall motion abnormalities with large pericardial effusion and features suggestive of tamponade   2/28 bilateral lower extremity venous duplex without evidence of DVT  3/3 subxyphoid pericardial window - L chest tube  3/8 chest tube removed  Antimicrobials:  none  Objective: Blood pressure 126/73, pulse (!) 101, temperature 97.5 F (36.4 C), temperature source Oral, resp. rate 19, height '5\' 4"'$  (1.626 m), weight 79.9 kg (176 lb 1.6 oz), SpO2 98 %.  Intake/Output Summary (Last 24 hours) at 10/02/16 1221 Last data filed at 10/02/16 0915  Gross per 24 hour  Intake             1635 ml  Output             1700 ml  Net              -65 ml   Filed Weights   09/30/16 0400 10/01/16 0600 10/02/16 0300  Weight: 81.8 kg (180 lb 5.4 oz) 81.6 kg (179 lb 14.4 oz) 79.9 kg (176  lb 1.6 oz)    Examination: General: No acute respiratory distress - alert/pleasant  Lungs: Clear to auscultation bilaterally - no wheezing , left chest wall sutures and dressing in place no signs of bleeding or infection. Cardiovascular: RRR - no M  Abdomen: Nondistended, soft, bs+  Extremities: no signif B LE edema   CBC:  Recent Labs Lab 09/27/16 0018 09/28/16 0209 09/29/16 0319 09/30/16 0354 10/01/16 0342 10/02/16 0455  WBC 6.1 9.0 9.8 10.7* 8.9  --   HGB 9.1* 9.0* 8.6* 8.9* 8.4* 10.8*  HCT 27.8* 27.8* 26.8* 28.8* 26.6* 33.6*  MCV 102.6* 102.6* 105.1* 105.9* 105.6*  --     PLT 195 228 267 372 341  --    Basic Metabolic Panel:  Recent Labs Lab 09/25/16 1425 09/26/16 0219 09/27/16 0018 09/28/16 0209 09/29/16 0319  NA 135 136 137 136 138  K 3.4* 3.1* 3.4* 3.2* 4.5  CL 93* 94* 94* 97* 102  CO2 '27 30 30 29 27  '$ GLUCOSE 138* 137* 110* 135* 120*  BUN '15 14 10 9 16  '$ CREATININE 1.40* 1.31* 1.36* 1.22* 1.22*  CALCIUM 8.9 8.7* 8.4* 8.1* 8.4*  MG  --   --   --   --  1.6*   GFR: Estimated Creatinine Clearance: 52.7 mL/min (by C-G formula based on SCr of 1.22 mg/dL (H)).  Liver Function Tests:  Recent Labs Lab 09/25/16 1425 09/26/16 0219 09/28/16 0209  AST 36 34 22  ALT 34 32 23  ALKPHOS 145* 143* 128*  BILITOT 0.5 0.4 0.8  PROT 6.9 6.3* 5.9*  ALBUMIN 2.9* 2.6* 2.2*    Coagulation Profile:  Recent Labs Lab 09/25/16 1425 09/30/16 0354 10/01/16 0342  INR 1.18 1.04 1.18    HbA1C: Hgb A1c MFr Bld  Date/Time Value Ref Range Status  11/26/2015 02:54 AM 6.7 (H) 4.8 - 5.6 % Final    Comment:    (NOTE)         Pre-diabetes: 5.7 - 6.4         Diabetes: >6.4         Glycemic control for adults with diabetes: <7.0     CBG:  Recent Labs Lab 10/01/16 1142 10/01/16 1626 10/01/16 2226 10/02/16 0741 10/02/16 1116  GLUCAP 103* 94 98 110* 120*    Recent Results (from the past 240 hour(s))  MRSA PCR Screening     Status: None   Collection Time: 09/24/16  2:53 PM  Result Value Ref Range Status   MRSA by PCR NEGATIVE NEGATIVE Final    Comment:        The GeneXpert MRSA Assay (FDA approved for NASAL specimens only), is one component of a comprehensive MRSA colonization surveillance program. It is not intended to diagnose MRSA infection nor to guide or monitor treatment for MRSA infections.   Surgical pcr screen     Status: None   Collection Time: 09/24/16  4:49 PM  Result Value Ref Range Status   MRSA, PCR NEGATIVE NEGATIVE Final   Staphylococcus aureus NEGATIVE NEGATIVE Final    Comment:        The Xpert SA Assay  (FDA approved for NASAL specimens in patients over 75 years of age), is one component of a comprehensive surveillance program.  Test performance has been validated by [facility] for patients greater than or equal to 55 year old. It is not intended to diagnose infection nor to guide or monitor treatment.   Fungus Culture With Stain     Status: None (Preliminary  result)   Collection Time: 09/26/16  9:01 AM  Result Value Ref Range Status   Fungus Stain Final report  Final    Comment: (NOTE) Performed At: [facility] [place], [place] [id number] Lindon Romp MD UM:[id number]    Fungus (Mycology) Culture PENDING  Incomplete   Fungal Source FLUID  Final    Comment: PERICARDIAL PATIENT ON FOLLOWING  ZINACEF   Acid Fast Smear (AFB)     Status: None   Collection Time: 09/26/16  9:01 AM  Result Value Ref Range Status   AFB Specimen Processing Concentration  Final   Acid Fast Smear Negative  Final    Comment: (NOTE) Performed At: [facility] [address] [place], [place] [id number] Lindon Romp MD JK:[id number]    Source (AFB) FLUID  Final    Comment: PERICARDIAL PATIENT ON FOLLOWING ZINACEF   Culture, body fluid-bottle     Status: None   Collection Time: 09/26/16  9:01 AM  Result Value Ref Range Status   Specimen Description FLUID PERICARDIAL  Final   Special Requests PATIENT ON FOLLOWING ZINACEF  Final   Culture NO GROWTH 5 DAYS  Final   Report Status 10/01/2016 FINAL  Final  Gram stain     Status: None   Collection Time: 09/26/16  9:01 AM  Result Value Ref Range Status   Specimen Description FLUID PERICARDIAL  Final   Special Requests PATIENT ON FOLLOWING  ZINACEF  Final   Gram Stain   Final    RARE WBC PRESENT,BOTH PMN AND MONONUCLEAR NO ORGANISMS SEEN    Report Status 09/26/2016 FINAL  Final  Fungus Culture Result     Status: None   Collection Time: 09/26/16  9:01 AM  Result Value Ref Range Status   Result 1 Comment   Final    Comment: (NOTE) KOH/Calcofluor preparation:  no fungus observed. Performed At: [facility] [place], [place] [id number] Lindon Romp MD KN:[id number]   Fungus Culture With Stain     Status: None (Preliminary result)   Collection Time: 09/26/16  9:12 AM  Result Value Ref Range Status   Fungus Stain Final report  Final    Comment: (NOTE) Performed At: [facility] [place], [place] [id number] Lindon Romp MD ZH:[id number]    Fungus (Mycology) Culture PENDING  Incomplete   Fungal Source TISSUE  Final    Comment: PERICARDIUM PATIENT ON FOLLOWING ZINACEF   Aerobic/Anaerobic Culture (surgical/deep wound)     Status: None   Collection Time: 09/26/16  9:12 AM  Result Value Ref Range Status   Specimen Description TISSUE  Final   Special Requests   Final    PERICARDIUM SPECIMEN C PATIENT ON FOLLOWING ZINACEF   Gram Stain NO WBC SEEN NO ORGANISMS SEEN   Final   Culture NO GROWTH 5 DAYS  Final   Report Status 10/01/2016 FINAL  Final  Acid Fast Smear (AFB)     Status: None   Collection Time: 09/26/16  9:12 AM  Result Value Ref Range Status   AFB Specimen Processing Comment  Final    Comment: Tissue Grinding and Digestion/Decontamination   Acid Fast Smear Negative  Final    Comment: (NOTE) Performed At: [facility] [place], [place] [id number] Lindon Romp MD GX:[id number]    Source (AFB) TISSUE  Final    Comment: PERICARDIUM PATIENT ON FOLLOWING  ZINACEF   Fungus Culture Result     Status: None  Collection Time: 09/26/16  9:12 AM  Result Value Ref Range Status   Result 1 Comment  Final    Comment: (NOTE) KOH/Calcofluor preparation:  no fungus observed. Performed At: [facility] [place], [place] [id number] Lindon Romp MD VQ:[id number]   Fungus Culture With Stain     Status: None (Preliminary result)   Collection Time: 09/26/16  9:26 AM  Result Value Ref  Range Status   Fungus Stain Final report  Final    Comment: (NOTE) Performed At: [facility] [place], [place] [id number] Lindon Romp MD YK:[id number]    Fungus (Mycology) Culture PENDING  Incomplete   Fungal Source FLUID  Final    Comment: PLEURAL PATIENT ON FOLLOWING ZINACEF   Acid Fast Smear (AFB)     Status: None   Collection Time: 09/26/16  9:26 AM  Result Value Ref Range Status   AFB Specimen Processing Concentration  Final   Acid Fast Smear Negative  Final    Comment: (NOTE) Performed At: [facility] [address] [place], [place] [id number] Lindon Romp MD [id number]    Source (AFB) FLUID  Final    Comment: PLEURAL PATIENT ON FOLLOWING ZINACEF   Culture, body fluid-bottle     Status: None   Collection Time: 09/26/16  9:26 AM  Result Value Ref Range Status   Specimen Description FLUID PLEURAL  Final   Special Requests PATIENT ON FOLLOWING  ZINACEF  Final   Culture NO GROWTH 5 DAYS  Final   Report Status 10/01/2016 FINAL  Final  Gram stain     Status: None   Collection Time: 09/26/16  9:26 AM  Result Value Ref Range Status   Specimen Description FLUID PLEURAL  Final   Special Requests PATIENT ON FOLLOWING  ZINACEF  Final   Gram Stain NO WBC SEEN NO ORGANISMS SEEN   Final   Report Status 09/26/2016 FINAL  Final  Fungus Culture Result     Status: None   Collection Time: 09/26/16  9:26 AM  Result Value Ref Range Status   Result 1 Comment  Final    Comment: (NOTE) KOH/Calcofluor preparation:  no fungus observed. Performed At: [facility] [place], [place] [id number] Lindon Romp MD MH:[id number]      Scheduled Meds: . atorvastatin  10 mg Oral q1800  . Chlorhexidine Gluconate Cloth  6 each Topical Q0600  . dabigatran  150 mg Oral Q12H  . diltiazem  120 mg Oral Daily  . DULoxetine  60 mg Oral Daily  . feeding supplement (ENSURE ENLIVE)  237 mL Oral BID BM  . fluticasone  2 spray Each  Nare Daily  . folic acid  1 mg Oral Daily  . insulin aspart  0-5 Units Subcutaneous QHS  . insulin aspart  0-9 Units Subcutaneous TID WC  . levalbuterol  1.25 mg Nebulization TID  . metoprolol tartrate  25 mg Oral BID  . montelukast  10 mg Oral QHS  . pantoprazole  40 mg Oral Daily  . polyethylene glycol  17 g Oral BID  . senna-docusate  1 tablet Oral BID  . vitamin C  1,000 mg Oral Daily      LOS: 10 days   Gerlean Ren MD [phone]  On-Call/Text Page:      [email or web address]      password [email or web address]  If 7PM-7AM, please contact night-coverage [email or web address] Password [email or web address] 10/02/2016, 12:21 PM

## 2016-10-02 NOTE — Progress Notes (Signed)
Progress Note  Patient Name: Carol Buchanan Date of Encounter: 10/02/2016  Primary Cardiologist: Dr. Acie Fredrickson  Subjective   Doing well after pericardial window  Inpatient Medications    Scheduled Meds: . atorvastatin  10 mg Oral q1800  . Chlorhexidine Gluconate Cloth  6 each Topical Q0600  . dabigatran  150 mg Oral Q12H  . diltiazem  120 mg Oral Daily  . DULoxetine  60 mg Oral Daily  . feeding supplement (ENSURE ENLIVE)  237 mL Oral BID BM  . fluticasone  2 spray Each Nare Daily  . folic acid  1 mg Oral Daily  . insulin aspart  0-5 Units Subcutaneous QHS  . insulin aspart  0-9 Units Subcutaneous TID WC  . levalbuterol  1.25 mg Nebulization TID  . metoprolol tartrate  25 mg Oral BID  . montelukast  10 mg Oral QHS  . pantoprazole  40 mg Oral Daily  . polyethylene glycol  17 g Oral BID  . senna-docusate  1 tablet Oral BID  . vitamin C  1,000 mg Oral Daily   Continuous Infusions:  PRN Meds: sodium chloride, HYDROcodone-acetaminophen, levalbuterol, LORazepam   Vital Signs    Vitals:   10/01/16 2300 10/02/16 0300 10/02/16 0835 10/02/16 0911  BP: 119/75 119/73 122/71 126/73  Pulse: 88 96 (!) 101   Resp: 19     Temp: 98.1 F (36.7 C) 98.3 F (36.8 C) 97.5 F (36.4 C)   TempSrc: Oral Axillary Oral   SpO2: 100% 100% 98%   Weight:  176 lb 1.6 oz (79.9 kg)    Height:        Intake/Output Summary (Last 24 hours) at 10/02/16 1217 Last data filed at 10/02/16 0915  Gross per 24 hour  Intake             1635 ml  Output             1700 ml  Net              -65 ml   Filed Weights   09/30/16 0400 10/01/16 0600 10/02/16 0300  Weight: 180 lb 5.4 oz (81.8 kg) 179 lb 14.4 oz (81.6 kg) 176 lb 1.6 oz (79.9 kg)     Physical Exam   General: Well developed, well nourished, female appearing in no acute distress. Head: Normocephalic, atraumatic.  Neck: Supple without bruits, JVD Lungs:  Resp regular and unlabored, Mild wheezing bilaterally. Left pleural drain in place  with dressing Heart: Regular rhythm, tachycardic rate, S1, S2, no murmur; no rub, pericardial drain in place with dressing Abdomen: Soft, non-tender, non-distended with normoactive bowel sounds. No hepatomegaly. No rebound/guarding. No obvious abdominal masses. Extremities: No clubbing, cyanosis, No edema. Distal pedal pulses are 2+ bilaterally. Neuro: Alert and oriented X 3. Moves all extremities spontaneously. Psych: Normal affect.  Labs    Chemistry  Recent Labs Lab 09/25/16 1425 09/26/16 0219 09/27/16 0018 09/28/16 0209 09/29/16 0319  NA 135 136 137 136 138  K 3.4* 3.1* 3.4* 3.2* 4.5  CL 93* 94* 94* 97* 102  CO2 '27 30 30 29 27  '$ GLUCOSE 138* 137* 110* 135* 120*  BUN '15 14 10 9 16  '$ CREATININE 1.40* 1.31* 1.36* 1.22* 1.22*  CALCIUM 8.9 8.7* 8.4* 8.1* 8.4*  PROT 6.9 6.3*  --  5.9*  --   ALBUMIN 2.9* 2.6*  --  2.2*  --   AST 36 34  --  22  --   ALT 34 32  --  23  --  ALKPHOS 145* 143*  --  128*  --   BILITOT 0.5 0.4  --  0.8  --   GFRNONAA 41* 45* 43* 49* 49*  GFRAA 48* 52* 49* 56* 56*  ANIONGAP '15 12 13 10 9     '$ Hematology  Recent Labs Lab 09/29/16 0319 09/30/16 0354 10/01/16 0342 10/02/16 0455  WBC 9.8 10.7* 8.9  --   RBC 2.55* 2.72* 2.52*  --   HGB 8.6* 8.9* 8.4* 10.8*  HCT 26.8* 28.8* 26.6* 33.6*  MCV 105.1* 105.9* 105.6*  --   MCH 33.7 32.7 33.3  --   MCHC 32.1 30.9 31.6  --   RDW 18.9* 19.0* 18.9*  --   PLT 267 372 341  --     Cardiac EnzymesNo results for input(s): TROPONINI in the last 168 hours.  No results for input(s): TROPIPOC in the last 168 hours.   BNP No results for input(s): BNP, PROBNP in the last 168 hours.   DDimer No results for input(s): DDIMER in the last 168 hours.   Radiology    Dg Chest 2 View  Result Date: 10/02/2016 CLINICAL DATA:  Chest tube removal. EXAM: CHEST  2 VIEW COMPARISON:  09/30/2016 FINDINGS: Right-sided Port-A-Cath terminates at the superior caval/ atrial junction. Mild tracheal deviation to the left secondary  to volume loss in the left hemithorax. Removal of left chest tube. No apical pneumothorax identified. Suspect trace inferolateral left pleural air. Similar small left pleural effusion. Clear right lung. Improved left-sided aeration with left perihilar and basilar opacities remaining. IMPRESSION: Removal of left chest tube with suspicion of trace inferolateral left pleural air. Similar left pleural effusion with improved left-sided aeration. Electronically Signed   By: Abigail Miyamoto M.D.   On: 10/02/2016 09:16     Telemetry    Sinus tachycardia 100s - Personally Reviewed  ECG     09/25/16: Sinus tachycardia, 101 bpm  - Personally Reviewed   Cardiac Studies   TEE 09/26/16:  Left ventricle: Normal cavity size, wall thickness, left ventricular diastolic function and left atrial pressure. LV systolic function is low normal with an EF of 50-55%.  Aortic valve: No stenosis. Trace regurgitation.  Mitral valve: Trace regurgitation.  Right ventricle: Normal cavity size, wall thickness and ejection fraction. Mildly compressed by pericardial fluid. This is notably improved after pericardial window procedure.  Tricuspid valve: Mild to moderate regurgitation.  Pericardium: Circumferential pericardial effusion. Effusion size is 171m.  Patient Profile     56y.o. 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  Presenting with one month hx worsening SOB.  CT angio of chest in ED today showed bilat segmental and subsegmental pulm emboli, no R heart strain, mild CHF, pericardial effusion. She had a pericardial window on 09/26/16.   Assessment & Plan    1. Pericardial effusion - s/p pericardial window on 3/3, CT surgery following    2. Pleural effusion - s/p CT placement,  110 out yesterday.   3. Sinus tachycardia No wheezing on exam today  Increased Metoprolol to 25  mg PO BID   3. HTN - pt is tolerating PO cardizem; BP 110-120 / 60-70s - HR continues in the 100s, likely secondary to PE and hypoxia - hold home verapamil with AKI  4. AKI - sCr 1.22 (1.36) with good urine output - diuresed 1.9L  yesterday, weight continues to trend down.  5. Anemia - Hgb 8.4 today, 2 units of PRBCs per oncology   6. Acute bilateral PE: Oncology following for anticoagulation, placed on Pradaxa There is some discussion about changing her to coumadin.  She should be able to go home soon.  We will sign off.  Call for questions    Mertie Moores, MD  10/02/2016 12:23 PM    [facility] [place],  [place] [place], [place]  [address] Pager [phone] Phone: [phone]; Fax: [phone]

## 2016-10-02 NOTE — Progress Notes (Signed)
Progress Note  Patient Name: Carol Buchanan Date of Encounter: 10/02/2016  Primary Cardiologist: Dr. Acie Fredrickson  Subjective   Pt with lung Ca, pulmonary embolus admitted with cardia tamponade   Inpatient Medications    Scheduled Meds: . atorvastatin  10 mg Oral q1800  . Chlorhexidine Gluconate Cloth  6 each Topical Q0600  . dabigatran  150 mg Oral Q12H  . diltiazem  120 mg Oral Daily  . DULoxetine  60 mg Oral Daily  . feeding supplement (ENSURE ENLIVE)  237 mL Oral BID BM  . fluticasone  2 spray Each Nare Daily  . folic acid  1 mg Oral Daily  . insulin aspart  0-5 Units Subcutaneous QHS  . insulin aspart  0-9 Units Subcutaneous TID WC  . levalbuterol  1.25 mg Nebulization TID  . metoprolol tartrate  25 mg Oral BID  . montelukast  10 mg Oral QHS  . pantoprazole  40 mg Oral Daily  . polyethylene glycol  17 g Oral BID  . senna-docusate  1 tablet Oral BID  . vitamin C  1,000 mg Oral Daily   Continuous Infusions:  PRN Meds: sodium chloride, HYDROcodone-acetaminophen, levalbuterol, LORazepam   Vital Signs    Vitals:   10/01/16 2300 10/02/16 0300 10/02/16 0835 10/02/16 0911  BP: 119/75 119/73 122/71 126/73  Pulse: 88 96 (!) 101   Resp: 19     Temp: 98.1 F (36.7 C) 98.3 F (36.8 C) 97.5 F (36.4 C)   TempSrc: Oral Axillary Oral   SpO2: 100% 100% 98%   Weight:  176 lb 1.6 oz (79.9 kg)    Height:        Intake/Output Summary (Last 24 hours) at 10/02/16 1213 Last data filed at 10/02/16 0915  Gross per 24 hour  Intake             1635 ml  Output             1700 ml  Net              -65 ml   Filed Weights   09/30/16 0400 10/01/16 0600 10/02/16 0300  Weight: 180 lb 5.4 oz (81.8 kg) 179 lb 14.4 oz (81.6 kg) 176 lb 1.6 oz (79.9 kg)     Physical Exam   General: Well developed, well nourished, female appearing in no acute distress. On nasal O2 Head: Normocephalic, atraumatic.  Neck: Supple without bruits, JVD Lungs:  Resp regular and unlabored, Mild wheezing  bilaterally. Left pleural drain in place with dressing Heart: Regular rhythm, tachycardic rate, S1, S2, no murmur; no rub, pericardial drain in place with dressing Abdomen: Soft, non-tender, non-distended with normoactive bowel sounds. No hepatomegaly. No rebound/guarding. No obvious abdominal masses. Extremities: No clubbing, cyanosis, No edema. Distal pedal pulses are 2+ bilaterally. Neuro: Alert and oriented X 3. Moves all extremities spontaneously. Psych: Normal affect.  Labs    Chemistry  Recent Labs Lab 09/25/16 1425 09/26/16 0219 09/27/16 0018 09/28/16 0209 09/29/16 0319  NA 135 136 137 136 138  K 3.4* 3.1* 3.4* 3.2* 4.5  CL 93* 94* 94* 97* 102  CO2 '27 30 30 29 27  '$ GLUCOSE 138* 137* 110* 135* 120*  BUN '15 14 10 9 16  '$ CREATININE 1.40* 1.31* 1.36* 1.22* 1.22*  CALCIUM 8.9 8.7* 8.4* 8.1* 8.4*  PROT 6.9 6.3*  --  5.9*  --   ALBUMIN 2.9* 2.6*  --  2.2*  --   AST 36 34  --  22  --  ALT 34 32  --  23  --   ALKPHOS 145* 143*  --  128*  --   BILITOT 0.5 0.4  --  0.8  --   GFRNONAA 41* 45* 43* 49* 49*  GFRAA 48* 52* 49* 56* 56*  ANIONGAP '15 12 13 10 9     '$ Hematology  Recent Labs Lab 09/29/16 0319 09/30/16 0354 10/01/16 0342 10/02/16 0455  WBC 9.8 10.7* 8.9  --   RBC 2.55* 2.72* 2.52*  --   HGB 8.6* 8.9* 8.4* 10.8*  HCT 26.8* 28.8* 26.6* 33.6*  MCV 105.1* 105.9* 105.6*  --   MCH 33.7 32.7 33.3  --   MCHC 32.1 30.9 31.6  --   RDW 18.9* 19.0* 18.9*  --   PLT 267 372 341  --      Radiology    Dg Chest 2 View  Result Date: 10/02/2016 CLINICAL DATA:  Chest tube removal. EXAM: CHEST  2 VIEW COMPARISON:  09/30/2016 FINDINGS: Right-sided Port-A-Cath terminates at the superior caval/ atrial junction. Mild tracheal deviation to the left secondary to volume loss in the left hemithorax. Removal of left chest tube. No apical pneumothorax identified. Suspect trace inferolateral left pleural air. Similar small left pleural effusion. Clear right lung. Improved left-sided  aeration with left perihilar and basilar opacities remaining. IMPRESSION: Removal of left chest tube with suspicion of trace inferolateral left pleural air. Similar left pleural effusion with improved left-sided aeration. Electronically Signed   By: Abigail Miyamoto M.D.   On: 10/02/2016 09:16     Telemetry    Sinus tachycardia 100s - Personally Reviewed  ECG     09/25/16: Sinus tachycardia, 98 bpm  - Personally Reviewed   Cardiac Studies   TEE 09/26/16:  Left ventricle: Normal cavity size, wall thickness, left ventricular diastolic function and left atrial pressure. LV systolic function is low normal with an EF of 50-55%.  Aortic valve: No stenosis. Trace regurgitation.  Mitral valve: Trace regurgitation.  Right ventricle: Normal cavity size, wall thickness and ejection fraction. Mildly compressed by pericardial fluid. This is notably improved after pericardial window procedure.  Tricuspid valve: Mild to moderate regurgitation.  Pericardium: Circumferential pericardial effusion. Effusion size is 113m.  Patient Profile     56y.o. 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  Presenting with one month hx worsening SOB.  CT angio of chest in ED today showed bilat segmental and subsegmental pulm emboli, no R heart strain, mild CHF, pericardial effusion. She had a pericardial window on 09/26/16.   Assessment & Plan    1. Pericardial effusion - s/p pericardial window on 3/3, CT surgery following - pt states chest pain has improved  2. Pleural effusion - s/p CT placement, now out   3. Sinus tachycardia  -Metoprolol 25 mg PO BID   3. HTN - pt is tolerating PO cardizem; BP 110-120 / 60-70s - HR continues in the 90's, likely secondary to PE and hypoxia - hold home verapamil with AKI  4. AKI - sCr 1.22 (1.36) with good urine output - diuresed 7.3 L  yesterday, weight continues to trend down.  5. Anemia - Hgb 10.8 today, after 2 units of PRBCs per oncology   6. Acute bilateral PE: Oncology following for anticoagulation.     LKerin Ransom PVermont 10/02/2016 12:13 PM  See note from same day   Doing great  Will sign off    Mertie Moores, MD  10/02/2016 12:25 PM    [facility],  [place], [place]  [address] Pager [phone] Phone: [phone]; Fax: [phone]

## 2016-10-05 ENCOUNTER — Other Ambulatory Visit: Payer: BLUE CROSS/BLUE SHIELD

## 2016-10-05 ENCOUNTER — Ambulatory Visit: Payer: BLUE CROSS/BLUE SHIELD | Admitting: Hematology & Oncology

## 2016-10-05 ENCOUNTER — Ambulatory Visit: Payer: BLUE CROSS/BLUE SHIELD

## 2016-10-06 ENCOUNTER — Ambulatory Visit (HOSPITAL_BASED_OUTPATIENT_CLINIC_OR_DEPARTMENT_OTHER): Payer: Medicare Other | Admitting: Hematology & Oncology

## 2016-10-06 ENCOUNTER — Other Ambulatory Visit (HOSPITAL_BASED_OUTPATIENT_CLINIC_OR_DEPARTMENT_OTHER): Payer: Medicare Other

## 2016-10-06 ENCOUNTER — Encounter: Payer: Self-pay | Admitting: Hematology & Oncology

## 2016-10-06 ENCOUNTER — Ambulatory Visit: Payer: Medicare Other

## 2016-10-06 VITALS — BP 111/69 | HR 92 | Temp 97.4°F | Resp 22 | Wt 169.1 lb

## 2016-10-06 DIAGNOSIS — J984 Other disorders of lung: Secondary | ICD-10-CM

## 2016-10-06 DIAGNOSIS — I2699 Other pulmonary embolism without acute cor pulmonale: Secondary | ICD-10-CM

## 2016-10-06 DIAGNOSIS — K922 Gastrointestinal hemorrhage, unspecified: Secondary | ICD-10-CM

## 2016-10-06 DIAGNOSIS — J9611 Chronic respiratory failure with hypoxia: Secondary | ICD-10-CM

## 2016-10-06 DIAGNOSIS — C3492 Malignant neoplasm of unspecified part of left bronchus or lung: Secondary | ICD-10-CM

## 2016-10-06 DIAGNOSIS — D500 Iron deficiency anemia secondary to blood loss (chronic): Secondary | ICD-10-CM | POA: Diagnosis not present

## 2016-10-06 DIAGNOSIS — C3490 Malignant neoplasm of unspecified part of unspecified bronchus or lung: Secondary | ICD-10-CM

## 2016-10-06 DIAGNOSIS — C3491 Malignant neoplasm of unspecified part of right bronchus or lung: Secondary | ICD-10-CM

## 2016-10-06 DIAGNOSIS — J189 Pneumonia, unspecified organism: Secondary | ICD-10-CM

## 2016-10-06 DIAGNOSIS — J9612 Chronic respiratory failure with hypercapnia: Secondary | ICD-10-CM

## 2016-10-06 LAB — CMP (CANCER CENTER ONLY)
ALT(SGPT): 34 U/L (ref 10–47)
AST: 33 U/L (ref 11–38)
Albumin: 3 g/dL — ABNORMAL LOW (ref 3.3–5.5)
Alkaline Phosphatase: 129 U/L — ABNORMAL HIGH (ref 26–84)
BUN: 16 mg/dL (ref 7–22)
CHLORIDE: 100 meq/L (ref 98–108)
CO2: 28 mEq/L (ref 18–33)
Calcium: 9.6 mg/dL (ref 8.0–10.3)
Creat: 1.3 mg/dl — ABNORMAL HIGH (ref 0.6–1.2)
Glucose, Bld: 159 mg/dL — ABNORMAL HIGH (ref 73–118)
POTASSIUM: 4 meq/L (ref 3.3–4.7)
Sodium: 140 mEq/L (ref 128–145)
TOTAL PROTEIN: 7.6 g/dL (ref 6.4–8.1)
Total Bilirubin: 0.7 mg/dl (ref 0.20–1.60)

## 2016-10-06 LAB — CBC WITH DIFFERENTIAL (CANCER CENTER ONLY)
BASO#: 0.1 10*3/uL (ref 0.0–0.2)
BASO%: 0.9 % (ref 0.0–2.0)
EOS%: 1.7 % (ref 0.0–7.0)
Eosinophils Absolute: 0.1 10*3/uL (ref 0.0–0.5)
HCT: 36.8 % (ref 34.8–46.6)
HGB: 12.2 g/dL (ref 11.6–15.9)
LYMPH#: 0.7 10*3/uL — ABNORMAL LOW (ref 0.9–3.3)
LYMPH%: 8.8 % — AB (ref 14.0–48.0)
MCH: 32.7 pg (ref 26.0–34.0)
MCHC: 33.2 g/dL (ref 32.0–36.0)
MCV: 99 fL (ref 81–101)
MONO#: 1.4 10*3/uL — AB (ref 0.1–0.9)
MONO%: 17 % — ABNORMAL HIGH (ref 0.0–13.0)
NEUT#: 6.1 10*3/uL (ref 1.5–6.5)
NEUT%: 71.6 % (ref 39.6–80.0)
PLATELETS: 381 10*3/uL (ref 145–400)
RBC: 3.73 10*6/uL (ref 3.70–5.32)
RDW: 18.5 % — AB (ref 11.1–15.7)
WBC: 8.5 10*3/uL (ref 3.9–10.0)

## 2016-10-06 LAB — TECHNOLOGIST REVIEW CHCC SATELLITE

## 2016-10-06 NOTE — Progress Notes (Signed)
Hematology and Oncology Follow Up Visit  Carol Buchanan [id number] 01-12-1961 56 y.o. 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 Pradaxa 150 mg po bid     Interim History:  Carol Buchanan here today for follow-up. She has had a very interesting time since we last saw her. She actually was admitted to the hospital. She was found to have a pulmonary embolism. This was bilateral. However, we'll was more important was felt that she had increasing fluid around the pericardium and had pericardial tap and not.  She had a go to surgery and a pericardial window was placed. This was on March 3. Thankfully, the pathology reports from her pericardial biopsy, pericardial fluid, and pleural fluid, all were negative. I'll not sure as to why she had all the fluid. She had chest tubes in place.  We got her on Pradaxa upon discharge for blood thinner. I'm sure that her having malignancy was the likely cause of the blood clots.  She feels better. She does not feel short of breath. I think some of the shortness of breath that she had had was because of the pericardial fluid.  She is not ready for any further chemotherapy right now.  Her appetite is slowly coming back.  She's had no problems with pain. She's had no nausea or vomiting. She has had no change in bowel or bladder habits.  Overall, her performance status is ECOG 2.   Medications:  Allergies as of 10/06/2016      Reactions   No Known Allergies       Medication List       Accurate as of 10/06/16 12:11 PM. Always use your most recent med list.          calcium-vitamin D 500-200 MG-UNIT tablet Take 1 tablet by mouth daily.   dabigatran 150 MG Caps capsule Commonly known as:  PRADAXA Take 1 capsule (150 mg total) by mouth every 12 (twelve) hours.   diltiazem 120 MG 24 hr capsule Commonly known as:  CARDIZEM CD Take 1 capsule (120 mg total) by mouth daily.   DULoxetine 60 MG capsule Commonly known as:  CYMBALTA Take 60 mg by mouth daily. Reported on 08/06/2015   fluticasone 50 MCG/ACT nasal spray Commonly known as:  FLONASE Place 2 sprays into both nostrils daily.   folic acid 1 MG tablet Commonly known as:  FOLVITE Take 1 tablet (1 mg total) by mouth daily. Start 5-7 days before Alimta chemotherapy. Continue until 21 days after Alimta completed.   glipiZIDE 5 MG tablet Commonly known as:  GLUCOTROL Take 1 tablet (5 mg total) by mouth 2 (two) times daily before a meal.   ipratropium-albuterol 0.5-2.5 (3) MG/3ML Soln Commonly known as:  DUONEB Take 3 mLs by nebulization 3 (three) times daily.   lidocaine-prilocaine cream Commonly known as:  EMLA Apply to affected area once   LORazepam 0.5 MG tablet Commonly known as:  ATIVAN take 1 tablet by mouth every 6 hours if needed for nausea or vomiting   meloxicam 15 MG tablet Commonly known as:  MOBIC Take 15 mg by mouth every other day.   metoprolol tartrate  25 MG tablet Commonly known as:  LOPRESSOR Take 1 tablet (25 mg total) by mouth 2 (two) times daily.   montelukast 10 MG tablet Commonly known as:  SINGULAIR take 1 tablet by mouth at bedtime   omeprazole 20 MG capsule Commonly known as:  PRILOSEC Take 1 capsule (20 mg total) by mouth daily.   ondansetron 4 MG tablet Commonly known as:  ZOFRAN take 1 tablet by mouth once daily   potassium chloride SA 20 MEQ tablet Commonly known as:  K-DUR,KLOR-CON Take 1 tablet (20 mEq total) by mouth daily.   simvastatin 20 MG tablet Commonly known as:  ZOCOR Take 20 mg by mouth daily.   STIOLTO RESPIMAT 2.5-2.5  MCG/ACT Aers Generic drug:  Tiotropium Bromide-Olodaterol inhale 2 puffs INTO THE LUNGS DAILY   triamterene-hydrochlorothiazide 75-50 MG tablet Commonly known as:  MAXZIDE Take 1 tablet by mouth daily.   vitamin C 1000 MG tablet Take 1,000 mg by mouth daily.       Allergies:  Allergies  Allergen Reactions  . No Known Allergies     Past Medical History, Surgical history, Social history, and Family History were reviewed and updated.  Review of Systems: All other 10 point review of systems is negative.   Physical Exam:  weight is 169 lb 1.9 oz (76.7 kg). Her oral temperature is 97.4 F (36.3 C). Her blood pressure is 111/69 and her pulse is 92. Her respiration is 22 (abnormal) and oxygen saturation is 100%.   Wt Readings from Last 3 Encounters:  10/06/16 169 lb 1.9 oz (76.7 kg)  10/02/16 176 lb 1.6 oz (79.9 kg)  09/14/16 180 lb (81.6 kg)    Mildly cushingoid appearing white female in no obvious distress. Head and neck exam shows no ocular or oral lesions. She has no thrush. No adenopathy is noted in the neck. Lungs show decent breath sounds bilaterally.  She has occasional wheezes. Cardiac exam regular rate and rhythm with no murmurs, rubs or bruits. Abdomen is soft. She is slightly obese. She has good bowel sounds. She has no guarding or rebound tenderness. She has no palpable hepatosplenomegaly. Rectal exam showed no masses in the rectal vault. She has brown stool that is heme negative. Back exam shows no tenderness over the spine, ribs or hips. Extremities shows no edema in her legs. She has good range of motion of her joints. Skin exam shows no rashes, ecchymoses or petechia.  Neurological exam shows no focal neurological deficits.   Lab Results  Component Value Date   WBC 8.9 10/01/2016   HGB 10.8 (L) 10/02/2016   HCT 33.6 (L) 10/02/2016   MCV 105.6 (H) 10/01/2016   PLT 341 10/01/2016   Lab Results  Component Value Date   FERRITIN 253 09/14/2016   IRON 77 09/14/2016    TIBC 374 09/14/2016   UIBC 296 09/14/2016   IRONPCTSAT 21 09/14/2016   Lab Results  Component Value Date   RBC 2.52 (L) 10/01/2016   No results found for: KPAFRELGTCHN, LAMBDASER, KAPLAMBRATIO No results found for: IGGSERUM, IGA, IGMSERUM No results found for: Ronnald Ramp, A1GS, A2GS, Tillman Sers, SPEI   Chemistry      Component Value Date/Time   NA 138 09/29/2016 0319   NA 140 09/14/2016 1046   NA 135 (L) 11/13/2015 1131   K 4.5 09/29/2016 0319   K 3.2 (L) 09/14/2016 1046   K 4.4 11/13/2015 1131   CL 102 09/29/2016 0319   CL 90 (L) 09/14/2016 1046  CO2 27 09/29/2016 0319   CO2 30 09/14/2016 1046   CO2 21 (L) 11/13/2015 1131   BUN 16 09/29/2016 0319   BUN 14 09/14/2016 1046   BUN 18.7 11/13/2015 1131   CREATININE 1.22 (H) 09/29/2016 0319   CREATININE 1.4 (H) 09/14/2016 1046   CREATININE 0.8 11/13/2015 1131      Component Value Date/Time   CALCIUM 8.4 (L) 09/29/2016 0319   CALCIUM 9.2 09/14/2016 1046   CALCIUM 8.9 11/13/2015 1131   ALKPHOS 128 (H) 09/28/2016 0209   ALKPHOS 85 (H) 09/14/2016 1046   ALKPHOS 66 11/13/2015 1131   AST 22 09/28/2016 0209   AST 28 09/14/2016 1046   AST 22 11/13/2015 1131   ALT 23 09/28/2016 0209   ALT 22 09/14/2016 1046   ALT 27 11/13/2015 1131   BILITOT 0.8 09/28/2016 0209   BILITOT 0.70 09/14/2016 1046   BILITOT 0.39 11/13/2015 1131     Impression and Plan: Carol Buchanan is a 56 yo white female with metastatic poorly differentiated carcinoma of the left lung and wild-type for the typical genetic mutations.   I'm glad that her breathing is doing better. Again, I don't think we have to treat her today.  I'll like to give her 2 weeks off. I think this would be very reasonable and very helpful for her.  She still has the sutures in. These hopefully will come out soon.  For right now, I will see her back in a couple weeks.  I don't think we have to do any scans on her probably until May.   She has  to be on blood thinner for quite a while. I probably would keep her on therapeutic Pradaxa for at least 6 months. We have be very careful with respect to her having bleeding. She has had GI bleeding in the past area did this certainly could be further increased with her being on blood thinner.   I spent about 30 minutes with she and her mom. I am glad to see that she is now out of the hospital.      Volanda Napoleon, MD 3/13/201812:11 PM

## 2016-10-07 LAB — IRON AND TIBC
%SAT: 24 % (ref 21–57)
Iron: 72 ug/dL (ref 41–142)
TIBC: 299 ug/dL (ref 236–444)
UIBC: 227 ug/dL (ref 120–384)

## 2016-10-07 LAB — ERYTHROPOIETIN: ERYTHROPOIETIN: 9.6 m[IU]/mL (ref 2.6–18.5)

## 2016-10-07 LAB — FERRITIN: FERRITIN: 779 ng/mL — AB (ref 9–269)

## 2016-10-15 ENCOUNTER — Other Ambulatory Visit: Payer: Self-pay | Admitting: Cardiothoracic Surgery

## 2016-10-15 DIAGNOSIS — C3490 Malignant neoplasm of unspecified part of unspecified bronchus or lung: Secondary | ICD-10-CM

## 2016-10-19 ENCOUNTER — Ambulatory Visit: Payer: Medicare Other | Admitting: Hematology & Oncology

## 2016-10-19 ENCOUNTER — Other Ambulatory Visit: Payer: Medicare Other

## 2016-10-19 ENCOUNTER — Ambulatory Visit
Admission: RE | Admit: 2016-10-19 | Discharge: 2016-10-19 | Disposition: A | Discharge status: Home / Self Care | Payer: Medicare Other | Source: Ambulatory Visit | Attending: Cardiothoracic Surgery | Admitting: Cardiothoracic Surgery

## 2016-10-19 ENCOUNTER — Ambulatory Visit: Payer: Medicare Other

## 2016-10-19 ENCOUNTER — Ambulatory Visit (INDEPENDENT_AMBULATORY_CARE_PROVIDER_SITE_OTHER): Payer: Self-pay | Admitting: Surgical

## 2016-10-19 VITALS — BP 93/60 | Resp 18 | Ht 64.0 in | Wt 169.0 lb

## 2016-10-19 DIAGNOSIS — C3490 Malignant neoplasm of unspecified part of unspecified bronchus or lung: Secondary | ICD-10-CM

## 2016-10-19 DIAGNOSIS — I3139 Other pericardial effusion (noninflammatory): Secondary | ICD-10-CM

## 2016-10-19 DIAGNOSIS — J90 Pleural effusion, not elsewhere classified: Secondary | ICD-10-CM

## 2016-10-19 DIAGNOSIS — Z09 Encounter for follow-up examination after completed treatment for conditions other than malignant neoplasm: Secondary | ICD-10-CM

## 2016-10-19 DIAGNOSIS — I313 Pericardial effusion (noninflammatory): Secondary | ICD-10-CM

## 2016-10-19 NOTE — Patient Instructions (Signed)
No specific restrictions.  

## 2016-10-19 NOTE — Progress Notes (Signed)
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  DATE OF BIRTH:  1960-07-29  DATE OF PROCEDURE:  09/26/2016 DATE OF DISCHARGE:                              OPERATIVE REPORT   OPERATION: 1. Subxiphoid pericardial drainage of 500 mL of pericardial effusion. 2. Placement of left chest tube for drainage of 500 mL of left pleural     effusion.  SURGEON:  Ivin Poot, M.D.  ASSISTANT:  John Giovanni, PA-C.  PREOPERATIVE DIAGNOSES:  History of advanced stage lung cancer, large pericardial effusion, moderate-large left pleural effusion.  POSTOPERATIVE DIAGNOSES:  History of advanced stage lung cancer, large pericardial effusion, moderate-large left pleural effusion.  ANESTHESIA:  General.  The patient is a 56 year old female status post the above procedure as described. She is seen in the office on today's date in routine office follow-up. Her chest x-ray appearance is stable. She does have some upper respiratory symptoms and feels that she may have developed a cold. She feels generally poor. She is undergoing chemotherapy currently. She denies fevers. She has not had chills. She does not have a productive cough.     Zubrod Score: At the time of surgery this patient's most appropriate activity status/level should be described as: '[]'$     0    Normal activity, no symptoms '[]'$     1    Restricted in physical strenuous activity but ambulatory, able to do out light work '[]'$     2    Ambulatory and capable of self care, unable to do work activities, up and about                 >50 %  of waking hours                                                                                   '[]'$     3    Only limited self care, in bed greater than 50% of waking hours '[]'$     4    Completely disabled, no self care, confined to bed or chair '[]'$     5    Moribund  History  Smoking Status  . Former Smoker  . Packs/day: 1.50  . Years: 35.00  . Types: Cigarettes  . Quit date: 01/26/2011  Smokeless Tobacco  . Never Used       Allergies  Allergen Reactions  . No Known Allergies     Current Outpatient Prescriptions  Medication Sig Dispense Refill  . Ascorbic Acid (VITAMIN C) 1000 MG  tablet Take 1,000 mg by mouth daily.    . Calcium Carbonate-Vitamin D (CALCIUM-VITAMIN D) 500-200 MG-UNIT tablet Take 1 tablet by mouth daily.    . dabigatran (PRADAXA) 150 MG CAPS capsule Take 1 capsule (150 mg total) by mouth every 12 (twelve) hours. 60 capsule 0  . diltiazem (CARDIZEM CD) 120 MG 24 hr capsule Take 1 capsule (120 mg total) by mouth daily. 30 capsule 0  . DULoxetine (CYMBALTA) 60 MG capsule Take 60 mg by mouth daily. Reported on 08/06/2015  0  . fluticasone (FLONASE) 50 MCG/ACT nasal spray Place 2 sprays into both nostrils daily. 16 g 5  . folic acid (FOLVITE) 1 MG tablet Take 1 tablet (1 mg total) by mouth daily. Start 5-7 days before Alimta chemotherapy. Continue until 21 days after Alimta completed. 100 tablet 3  . glipiZIDE (GLUCOTROL) 5 MG tablet Take 1 tablet (5 mg total) by mouth 2 (two) times daily before a meal. (Patient taking differently: Take 5 mg by mouth daily before breakfast. ) 60 tablet 0  . ipratropium-albuterol (DUONEB) 0.5-2.5 (3) MG/3ML SOLN Take 3 mLs by nebulization 3 (three) times daily. (Patient taking differently: Take 3 mLs by nebulization daily. ) 360 mL 2  . lidocaine-prilocaine (EMLA) cream Apply to affected area once 30 g 3  . LORazepam (ATIVAN) 0.5 MG tablet take 1 tablet by mouth every 6 hours if needed for nausea or vomiting 30 tablet 3  . meloxicam (MOBIC) 15  MG tablet Take 15 mg by mouth every other day.    . metoprolol tartrate (LOPRESSOR) 25 MG tablet Take 1 tablet (25 mg total) by mouth 2 (two) times daily. 60 tablet 0  . montelukast (SINGULAIR) 10 MG tablet take 1 tablet by mouth at bedtime 30 tablet 1  . omeprazole (PRILOSEC) 20 MG capsule Take 1 capsule (20 mg total) by mouth daily. 60 capsule 0  . ondansetron (ZOFRAN) 4 MG tablet take 1 tablet by mouth once daily 30 tablet 2  . potassium chloride SA (K-DUR,KLOR-CON) 20 MEQ tablet Take 1 tablet (20 mEq total) by mouth daily. 30 tablet 6  . simvastatin (ZOCOR) 20 MG tablet Take 20 mg by mouth daily.     Marland Kitchen STIOLTO RESPIMAT 2.5-2.5 MCG/ACT AERS inhale 2 puffs INTO THE LUNGS DAILY 4 g 5  . triamterene-hydrochlorothiazide (MAXZIDE) 75-50 MG tablet Take 1 tablet by mouth daily. 30 tablet 5   No current facility-administered medications for this visit.        Physical Exam: BP 93/60 (BP Location: Right Arm, Patient Position: Sitting, Cuff Size: Large)   Resp 18 Comment: WITH WHEEZING ON ARRIVAL  Ht '5\' 4"'$  (1.626 m)   Wt 169 lb (76.7 kg)   SpO2 97% Comment: ON 3L O2  BMI 29.01 kg/m   General appearance: alert, cooperative and Appears chronically ill Heart: regular rate and rhythm Lungs: Scattered wheezes throughout Wounds: Incisions healing well without evidence of infection.  Diagnostic Studies & Laboratory data:         Recent Radiology Findings: Dg Chest 2 View  Result Date: 10/19/2016 CLINICAL DATA:  Lung cancer, post pericardial window March 2018, chest pain, shortness of breath, COPD, diabetes mellitus, hypertension, prior radiation therapy EXAM: CHEST  2 VIEW COMPARISON:  10/02/2016 FINDINGS: RIGHT jugular Port-A-Cath with tip projecting over cavoatrial junction unchanged. Volume loss in the LEFT hemithorax. Stable heart size. LEFT perihilar masslike opacity again identified, could represent sequela of radiation therapy though mass is not excluded. Persistent pleural effusion and  basilar atelectasis on  LEFT. RIGHT lung remains clear. No pneumothorax. Bones unremarkable. IMPRESSION: Persistent LEFT perihilar masslike opacity, LEFT pleural effusion and basilar atelectasis as above. No new abnormalities. Electronically Signed   By: Lavonia Dana M.D.   On: 10/19/2016 12:54      I have independently reviewed the above radiology findings and reviewed findings  with the patient.  Recent Labs: Lab Results  Component Value Date   WBC 8.5 10/06/2016   HGB 12.2 10/06/2016   HCT 36.8 10/06/2016   PLT 381 10/06/2016   GLUCOSE 159 (H) 10/06/2016   ALT 34 10/06/2016   AST 33 10/06/2016   NA 140 10/06/2016   K 4.0 10/06/2016   CL 100 10/06/2016   CREATININE 1.3 (H) 10/06/2016   BUN 16 10/06/2016   CO2 28 10/06/2016   TSH 0.857 11/26/2015   INR 1.18 10/01/2016   HGBA1C 6.7 (H) 11/26/2015      Assessment / Plan:  The patient appears stable from a thoracic surgical standpoint. Her x-ray findings show no acute abnormalities. She obviously have some significant impairment in her overall immune system so may very well be getting an upper respiratory infection. She will continue to seek care from her oncologist and primary physicians and will report if symptoms worsen in particular she developed significant sputum production or fevers/chills. We will see her again on a when necessary basis for any surgically related needs or at request.       Cole Eastridge E 10/19/2016 1:47 PM

## 2016-10-20 ENCOUNTER — Ambulatory Visit (HOSPITAL_BASED_OUTPATIENT_CLINIC_OR_DEPARTMENT_OTHER): Payer: Medicare Other

## 2016-10-20 ENCOUNTER — Other Ambulatory Visit (HOSPITAL_BASED_OUTPATIENT_CLINIC_OR_DEPARTMENT_OTHER): Payer: Medicare Other

## 2016-10-20 ENCOUNTER — Ambulatory Visit: Payer: Medicare Other

## 2016-10-20 ENCOUNTER — Ambulatory Visit (HOSPITAL_COMMUNITY)
Admission: RE | Admit: 2016-10-20 | Discharge: 2016-10-20 | Disposition: A | Discharge status: Home / Self Care | Payer: Medicare Other | Source: Ambulatory Visit | Attending: General Surgery | Admitting: General Surgery

## 2016-10-20 ENCOUNTER — Ambulatory Visit (HOSPITAL_COMMUNITY)
Admission: RE | Admit: 2016-10-20 | Discharge: 2016-10-20 | Disposition: A | Discharge status: Home / Self Care | Payer: Medicare Other | Source: Ambulatory Visit | Attending: Family | Admitting: Family

## 2016-10-20 ENCOUNTER — Ambulatory Visit (HOSPITAL_BASED_OUTPATIENT_CLINIC_OR_DEPARTMENT_OTHER): Payer: Medicare Other | Admitting: Family

## 2016-10-20 VITALS — BP 83/52 | HR 85 | Temp 97.5°F | Resp 18 | Wt 171.0 lb

## 2016-10-20 DIAGNOSIS — Z0189 Encounter for other specified special examinations: Secondary | ICD-10-CM | POA: Diagnosis present

## 2016-10-20 DIAGNOSIS — K922 Gastrointestinal hemorrhage, unspecified: Secondary | ICD-10-CM

## 2016-10-20 DIAGNOSIS — C3492 Malignant neoplasm of unspecified part of left bronchus or lung: Secondary | ICD-10-CM

## 2016-10-20 DIAGNOSIS — J90 Pleural effusion, not elsewhere classified: Secondary | ICD-10-CM | POA: Diagnosis not present

## 2016-10-20 DIAGNOSIS — Z452 Encounter for adjustment and management of vascular access device: Secondary | ICD-10-CM | POA: Diagnosis present

## 2016-10-20 DIAGNOSIS — C3490 Malignant neoplasm of unspecified part of unspecified bronchus or lung: Secondary | ICD-10-CM

## 2016-10-20 DIAGNOSIS — Z9889 Other specified postprocedural states: Secondary | ICD-10-CM

## 2016-10-20 DIAGNOSIS — J84113 Idiopathic non-specific interstitial pneumonitis: Secondary | ICD-10-CM | POA: Diagnosis not present

## 2016-10-20 LAB — IRON AND TIBC
%SAT: 45 % (ref 21–57)
Iron: 117 ug/dL (ref 41–142)
TIBC: 263 ug/dL (ref 236–444)
UIBC: 145 ug/dL (ref 120–384)

## 2016-10-20 LAB — CBC WITH DIFFERENTIAL (CANCER CENTER ONLY)
BASO#: 0 10*3/uL (ref 0.0–0.2)
BASO%: 0.6 % (ref 0.0–2.0)
EOS ABS: 0.3 10*3/uL (ref 0.0–0.5)
EOS%: 3.5 % (ref 0.0–7.0)
HEMATOCRIT: 30.3 % — AB (ref 34.8–46.6)
HGB: 10 g/dL — ABNORMAL LOW (ref 11.6–15.9)
LYMPH#: 0.9 10*3/uL (ref 0.9–3.3)
LYMPH%: 12.7 % — ABNORMAL LOW (ref 14.0–48.0)
MCH: 32.1 pg (ref 26.0–34.0)
MCHC: 33 g/dL (ref 32.0–36.0)
MCV: 97 fL (ref 81–101)
MONO#: 0.8 10*3/uL (ref 0.1–0.9)
MONO%: 11.9 % (ref 0.0–13.0)
NEUT#: 5.1 10*3/uL (ref 1.5–6.5)
NEUT%: 71.3 % (ref 39.6–80.0)
Platelets: 346 10*3/uL (ref 145–400)
RBC: 3.12 10*6/uL — ABNORMAL LOW (ref 3.70–5.32)
RDW: 17.8 % — AB (ref 11.1–15.7)
WBC: 7.1 10*3/uL (ref 3.9–10.0)

## 2016-10-20 LAB — CMP (CANCER CENTER ONLY)
ALK PHOS: 128 U/L — AB (ref 26–84)
ALT: 21 U/L (ref 10–47)
AST: 35 U/L (ref 11–38)
Albumin: 2.8 g/dL — ABNORMAL LOW (ref 3.3–5.5)
BUN, Bld: 15 mg/dL (ref 7–22)
CALCIUM: 8.8 mg/dL (ref 8.0–10.3)
CO2: 27 mEq/L (ref 18–33)
Chloride: 96 mEq/L — ABNORMAL LOW (ref 98–108)
Creat: 1.3 mg/dl — ABNORMAL HIGH (ref 0.6–1.2)
GLUCOSE: 149 mg/dL — AB (ref 73–118)
POTASSIUM: 3.9 meq/L (ref 3.3–4.7)
Sodium: 138 mEq/L (ref 128–145)
Total Bilirubin: 0.6 mg/dl (ref 0.20–1.60)
Total Protein: 7.2 g/dL (ref 6.4–8.1)

## 2016-10-20 LAB — FERRITIN

## 2016-10-20 MED ORDER — HEPARIN SOD (PORK) LOCK FLUSH 100 UNIT/ML IV SOLN
500.0000 [IU] | Freq: Once | INTRAVENOUS | Status: AC
  Administered 2016-10-20: 500 [IU] via INTRAVENOUS
  Filled 2016-10-20: qty 5

## 2016-10-20 MED ORDER — HYDROCODONE-HOMATROPINE 5-1.5 MG/5ML PO SYRP: 5.0000 mL | ORAL_SOLUTION | Freq: Four times a day (QID) | ORAL | 0 refills | Status: AC | PRN

## 2016-10-20 MED ORDER — SODIUM CHLORIDE 0.9% FLUSH
10.0000 mL | INTRAVENOUS | Status: AC | PRN
  Administered 2016-10-20: 10 mL via INTRAVENOUS
  Filled 2016-10-20: qty 10

## 2016-10-20 MED ORDER — AZITHROMYCIN 250 MG PO TABS: ORAL_TABLET | ORAL | 0 refills | Status: AC

## 2016-10-20 MED ORDER — DEXTROSE 5 % IV SOLN: 500.0000 mg | Freq: Once | INTRAVENOUS | Status: DC

## 2016-10-20 NOTE — Progress Notes (Signed)
Hematology and Oncology Follow Up Visit  Carol Buchanan [id number] 1961/03/25 56 y.o. 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 Pradaxa 150 mg po bid    Interim History:  [name] here today for follow-up and possibly treatment. She is feeling fatigued, weak, nonproductive cough, having worsening SOB on 3 L [place]. She states that her mother has been sick with an upper respiratory infection and she thinks she may have caught it. Her chest xray yesterday is visibly worse on the left side when compared to 3/9 imaging. She has a worsened pleural effusion on the left. Her breath sounds are course with wheezes and rhonchi throughout, worse on left.  She is currently on Pradaxa for PE. We have contacted radiology and they are willing to move forward with a thoracentesis today. We will send fluid for cytology and culture.  No fever, chills, n/v, rash, dizziness, chest pain, palpitations, abdominal pain or changes in bowel or bladder habits.  She has had intermittent constipation and some spotting of bright red blood on the toilet tissue when she strains. Hgb is stable at 10.0. No other episodes of bleeding, no bruising or petechiae.  She had her stitches to the lower mid abdomen incision and incision under left breast removed. Incision site under left breast is oozing serous fluid. No redness or warmth at site indicating infection. Patient will keep clean and dry and apply neosporin as needed.  No tenderness, numbness or tingling in her extremities. She has puffiness in her face and ankles. Her appetite comes and  goes. She is staying hydrated. Her weight is stable.   Medications:  Allergies as of 10/20/2016      Reactions   No Known Allergies       Medication List       Accurate as of 10/20/16 11:22 AM. Always use your most recent med list.          calcium-vitamin D 500-200 MG-UNIT tablet Take 1 tablet by mouth daily.   dabigatran 150 MG Caps capsule Commonly known as:  PRADAXA Take 1 capsule (150 mg total) by mouth every 12 (twelve) hours.   diltiazem 120 MG 24 hr capsule Commonly known as:  CARDIZEM CD Take 1 capsule (120 mg total) by mouth daily.   DULoxetine 60 MG capsule Commonly known as:  CYMBALTA Take 60 mg by mouth daily. Reported on 08/06/2015   fluticasone 50 MCG/ACT nasal spray Commonly known as:  FLONASE Place 2 sprays into both nostrils daily.   folic acid 1 MG tablet Commonly known as:  FOLVITE Take 1 tablet (1 mg total) by mouth daily. Start 5-7 days before Alimta chemotherapy. Continue until 21 days after Alimta completed.   glipiZIDE 5 MG tablet Commonly known as:  GLUCOTROL Take 1 tablet (5 mg total) by mouth 2 (two) times daily before a meal.   ipratropium-albuterol 0.5-2.5 (3) MG/3ML Soln Commonly known as:  DUONEB Take 3 mLs by nebulization 3 (three) times daily.   lidocaine-prilocaine cream Commonly known as:  EMLA Apply to affected area once   LORazepam 0.5 MG tablet Commonly known as:  ATIVAN take 1 tablet by mouth every 6  hours if needed for nausea or vomiting   meloxicam 15 MG tablet Commonly known as:  MOBIC Take 15 mg by mouth every other day.   metoprolol tartrate 25 MG tablet Commonly known as:  LOPRESSOR Take 1 tablet (25 mg total) by mouth 2 (two) times daily.   montelukast 10 MG tablet Commonly known as:  SINGULAIR take 1 tablet by mouth at bedtime   omeprazole 20 MG capsule Commonly known as:  PRILOSEC Take 1 capsule (20 mg total) by mouth daily.   ondansetron 4 MG tablet Commonly known as:  ZOFRAN take 1 tablet by mouth  once daily   polyethylene glycol powder powder Commonly known as:  GLYCOLAX/MIRALAX take 17GM (DISSOLVED IN WATER) by mouth twice a day   potassium chloride SA 20 MEQ tablet Commonly known as:  K-DUR,KLOR-CON Take 1 tablet (20 mEq total) by mouth daily.   prochlorperazine 10 MG tablet Commonly known as:  COMPAZINE   simvastatin 20 MG tablet Commonly known as:  ZOCOR Take 20 mg by mouth daily.   STIOLTO RESPIMAT 2.5-2.5 MCG/ACT Aers Generic drug:  Tiotropium Bromide-Olodaterol inhale 2 puffs INTO THE LUNGS DAILY   triamterene-hydrochlorothiazide 75-50 MG tablet Commonly known as:  MAXZIDE Take 1 tablet by mouth daily.   verapamil 180 MG CR tablet Commonly known as:  CALAN-SR   vitamin C 1000 MG tablet Take 1,000 mg by mouth daily.       Allergies:  Allergies  Allergen Reactions  . No Known Allergies     Past Medical History, Surgical history, Social history, and Family History were reviewed and updated.  Review of Systems: All other 10 point review of systems is negative.   Physical Exam:  vitals were not taken for this visit.  Wt Readings from Last 3 Encounters:  10/20/16 171 lb (77.6 kg)  10/19/16 169 lb (76.7 kg)  10/06/16 169 lb 1.9 oz (76.7 kg)    Ocular: Sclerae unicteric, pupils equal, round and reactive to light Ear-nose-throat: Oropharynx clear, dentition fair Lymphatic: No cervical supraclavicular or axillary adenopathy Lungs course with wheezes and rhonchi, worse on left, patient visibly SOB on 3L [place] Heart regular rate and rhythm, no murmur appreciated Abd soft, nontender, positive bowel sounds, no liver or spleen tip palpated on exam, no fluid wave MSK no focal spinal tenderness, no joint edema Neuro: non-focal, well-oriented, appropriate affect Breasts: Deferred    Lab Results  Component Value Date   WBC 7.1 10/20/2016   HGB 10.0 (L) 10/20/2016   HCT 30.3 (L) 10/20/2016   MCV 97 10/20/2016   PLT 346 10/20/2016   Lab Results    Component Value Date   FERRITIN 779 (H) 10/06/2016   IRON 72 10/06/2016   TIBC 299 10/06/2016   UIBC 227 10/06/2016   IRONPCTSAT 24 10/06/2016   Lab Results  Component Value Date   RBC 3.12 (L) 10/20/2016   No results found for: KPAFRELGTCHN, LAMBDASER, KAPLAMBRATIO No results found for: IGGSERUM, IGA, IGMSERUM No results found for: Ronnald Ramp, A1GS, A2GS, Violet Baldy, MSPIKE, SPEI   Chemistry      Component Value Date/Time   NA 140 10/06/2016 1146   NA 135 (L) 11/13/2015 1131   K 4.0 10/06/2016 1146   K 4.4 11/13/2015 1131   CL 100 10/06/2016 1146   CO2 28 10/06/2016 1146   CO2 21 (L) 11/13/2015 1131   BUN 16 10/06/2016 1146   BUN 18.7 11/13/2015 1131   CREATININE 1.3 (H) 10/06/2016 1146   CREATININE 0.8 11/13/2015  1131      Component Value Date/Time   CALCIUM 9.6 10/06/2016 1146   CALCIUM 8.9 11/13/2015 1131   ALKPHOS 129 (H) 10/06/2016 1146   ALKPHOS 66 11/13/2015 1131   AST 33 10/06/2016 1146   AST 22 11/13/2015 1131   ALT 34 10/06/2016 1146   ALT 27 11/13/2015 1131   BILITOT 0.70 10/06/2016 1146   BILITOT 0.39 11/13/2015 1131      Impression and Plan: Carol Buchanan is a 56 yo white female with metastatic poorly differentiated carcinoma of the left lung and wild-type for the typical genetic mutations. She is here today for follow-up and treatment. She is symptomatic at this time with fatigue, weakness, cough and worsening of SOB on supplemental O2. She has a worsening pleural effusion on the left.  We sent her today for thoracentesis and 50 cc was removed. Effusion was loculated.  We sent her home on a Z-pack for the next 5 days. She will contact our office if her symptoms worsen.  She also has a prescription for Hycodan for her cough.  We will hold treatment today and plan to see her back in 1 weeks for re-evaluation.  Both she and her sweet family know to contact our office with any questions or concerns. We canc ertainly see her sooner  if need be.   Eliezer Bottom, NP 3/27/201811:22 AM    ADDENDUM:  I saw and examined the patient with Judson Roch. Ms. Poulter  clearly has an increased left pleural effusion. We compared the x-ray she had done yesterday with one that was done earlier.  Hopefully this is a pleural fusion secondary to an infectious process.  She will need to have this drained. We'll need to send the fluid for cytology and microbiology.  She is on Pradaxa. Thankfully, the radiologist, who are fantastic, will be able to do the thoracentesis today.  We will have to hold off on her chemotherapy today. I just don't think she is ready to have this.  We will call in a prescription for Zithromax.  It sounds like she may have gotten some respiratory infection from her mother. We need to be aggressive with any respiratory issues that she has because of the pneumonitis caused by past Keytruda.  We'll have her come back in 1 week and we'll see how she looks then and see if we need to do a treatment on her.  We spent about 45 minutes with her going over the x-rays and talking to her about having the thoracentesis. She agrees with this.  Lattie Haw, MD

## 2016-10-20 NOTE — Addendum Note (Signed)
Addended by: Rico Ala on: 10/20/2016 12:19 PM   Modules accepted: Orders, SmartSet

## 2016-10-20 NOTE — Procedures (Signed)
Ultrasound-guided diagnostic and therapeutic left thoracentesis performed yielding 50cc of bloody colored fluid. No immediate complications. Follow-up chest x-ray pending.  50cc was all that could be obtained as this effusion was loculated.  Ulisses Vondrak E 2:03 PM 10/20/2016

## 2016-10-25 LAB — BODY FLUID CULTURE: Culture: NO GROWTH

## 2016-10-26 ENCOUNTER — Ambulatory Visit: Payer: BLUE CROSS/BLUE SHIELD

## 2016-10-26 ENCOUNTER — Other Ambulatory Visit: Payer: BLUE CROSS/BLUE SHIELD

## 2016-10-26 ENCOUNTER — Ambulatory Visit: Payer: BLUE CROSS/BLUE SHIELD | Admitting: Hematology & Oncology

## 2016-10-26 LAB — FUNGUS CULTURE WITH STAIN

## 2016-10-26 LAB — FUNGUS CULTURE RESULT

## 2016-10-26 LAB — FUNGAL ORGANISM REFLEX

## 2016-10-28 ENCOUNTER — Ambulatory Visit (HOSPITAL_BASED_OUTPATIENT_CLINIC_OR_DEPARTMENT_OTHER): Payer: Medicare Other

## 2016-10-28 ENCOUNTER — Ambulatory Visit: Payer: Medicare Other

## 2016-10-28 ENCOUNTER — Ambulatory Visit (HOSPITAL_BASED_OUTPATIENT_CLINIC_OR_DEPARTMENT_OTHER): Payer: Medicare Other | Admitting: Family

## 2016-10-28 ENCOUNTER — Other Ambulatory Visit (HOSPITAL_BASED_OUTPATIENT_CLINIC_OR_DEPARTMENT_OTHER): Payer: Medicare Other

## 2016-10-28 VITALS — BP 88/76 | HR 88 | Temp 97.8°F | Resp 18 | Wt 178.0 lb

## 2016-10-28 DIAGNOSIS — B3781 Candidal esophagitis: Secondary | ICD-10-CM

## 2016-10-28 DIAGNOSIS — C3492 Malignant neoplasm of unspecified part of left bronchus or lung: Secondary | ICD-10-CM | POA: Diagnosis present

## 2016-10-28 DIAGNOSIS — J90 Pleural effusion, not elsewhere classified: Secondary | ICD-10-CM

## 2016-10-28 DIAGNOSIS — C3490 Malignant neoplasm of unspecified part of unspecified bronchus or lung: Secondary | ICD-10-CM

## 2016-10-28 DIAGNOSIS — Z5111 Encounter for antineoplastic chemotherapy: Secondary | ICD-10-CM

## 2016-10-28 DIAGNOSIS — I2602 Saddle embolus of pulmonary artery with acute cor pulmonale: Secondary | ICD-10-CM

## 2016-10-28 DIAGNOSIS — D649 Anemia, unspecified: Secondary | ICD-10-CM

## 2016-10-28 DIAGNOSIS — D630 Anemia in neoplastic disease: Secondary | ICD-10-CM

## 2016-10-28 DIAGNOSIS — C3491 Malignant neoplasm of unspecified part of right bronchus or lung: Secondary | ICD-10-CM

## 2016-10-28 LAB — CBC WITH DIFFERENTIAL (CANCER CENTER ONLY)
BASO#: 0 10*3/uL (ref 0.0–0.2)
BASO%: 0.3 % (ref 0.0–2.0)
EOS%: 1.5 % (ref 0.0–7.0)
Eosinophils Absolute: 0.1 10*3/uL (ref 0.0–0.5)
HEMATOCRIT: 27.1 % — AB (ref 34.8–46.6)
HGB: 8.9 g/dL — ABNORMAL LOW (ref 11.6–15.9)
LYMPH#: 0.8 10*3/uL — AB (ref 0.9–3.3)
LYMPH%: 8.4 % — ABNORMAL LOW (ref 14.0–48.0)
MCH: 32.8 pg (ref 26.0–34.0)
MCHC: 32.8 g/dL (ref 32.0–36.0)
MCV: 100 fL (ref 81–101)
MONO#: 1.2 10*3/uL — ABNORMAL HIGH (ref 0.1–0.9)
MONO%: 13.5 % — ABNORMAL HIGH (ref 0.0–13.0)
NEUT#: 6.8 10*3/uL — ABNORMAL HIGH (ref 1.5–6.5)
NEUT%: 76.3 % (ref 39.6–80.0)
PLATELETS: 326 10*3/uL (ref 145–400)
RBC: 2.71 10*6/uL — AB (ref 3.70–5.32)
RDW: 18.4 % — AB (ref 11.1–15.7)
WBC: 9 10*3/uL (ref 3.9–10.0)

## 2016-10-28 LAB — COMPREHENSIVE METABOLIC PANEL
ALT: 16 U/L (ref 0–55)
AST: 19 U/L (ref 5–34)
Albumin: 2.7 g/dL — ABNORMAL LOW (ref 3.5–5.0)
Alkaline Phosphatase: 143 U/L (ref 40–150)
Anion Gap: 11 mEq/L (ref 3–11)
BUN: 11.4 mg/dL (ref 7.0–26.0)
CALCIUM: 9.3 mg/dL (ref 8.4–10.4)
CHLORIDE: 100 meq/L (ref 98–109)
CO2: 26 meq/L (ref 22–29)
CREATININE: 1.1 mg/dL (ref 0.6–1.1)
EGFR: 58 mL/min/{1.73_m2} — ABNORMAL LOW (ref >90)
Glucose: 125 mg/dl (ref 70–140)
Potassium: 4 mEq/L (ref 3.5–5.1)
Sodium: 137 mEq/L (ref 136–145)
Total Bilirubin: 0.55 mg/dL (ref 0.20–1.20)
Total Protein: 6.9 g/dL (ref 6.4–8.3)

## 2016-10-28 MED ORDER — SODIUM CHLORIDE 0.9% FLUSH
10.0000 mL | INTRAVENOUS | Status: DC | PRN
  Administered 2016-10-28: 10 mL
  Filled 2016-10-28: qty 10

## 2016-10-28 MED ORDER — PROCHLORPERAZINE MALEATE 10 MG PO TABS
ORAL_TABLET | ORAL | Status: AC
  Filled 2016-10-28: qty 1

## 2016-10-28 MED ORDER — HEPARIN SOD (PORK) LOCK FLUSH 100 UNIT/ML IV SOLN
500.0000 [IU] | Freq: Once | INTRAVENOUS | Status: AC | PRN
  Administered 2016-10-28: 500 [IU]
  Filled 2016-10-28: qty 5

## 2016-10-28 MED ORDER — SODIUM CHLORIDE 0.9 % IV SOLN
Freq: Once | INTRAVENOUS | Status: AC
  Administered 2016-10-28: 15:00:00 via INTRAVENOUS

## 2016-10-28 MED ORDER — SODIUM CHLORIDE 0.9 % IV SOLN
525.0000 mg/m2 | Freq: Once | INTRAVENOUS | Status: AC
  Administered 2016-10-28: 1000 mg via INTRAVENOUS
  Filled 2016-10-28: qty 40

## 2016-10-28 MED ORDER — PROCHLORPERAZINE MALEATE 10 MG PO TABS
10.0000 mg | ORAL_TABLET | Freq: Once | ORAL | Status: AC
  Administered 2016-10-28: 10 mg via ORAL

## 2016-10-28 NOTE — Progress Notes (Signed)
Hematology and Oncology Follow Up Visit  Carol Buchanan [id number] 22-Dec-1960 56 y.o. 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 She is still feeling fatigued and weak at times.  She had a thoracentesis to try and drain the left pleural effusion last week but this was loculated and on 50 cc was obtained. Culture of the fluid was negative. Her lung sound are slightly improved. Her SOB is unchanged. She is on 4 L [place] supplemental O2 24 hours a day. She states that she has an occasional dry cough and feels like she has a "frog in her throat." No problems swallowing or c/o sore throat.   No fever, chills, n/v, rash, dizziness, chest pain, palpitations, abdominal pain or changes in bowel or bladder habits.  She had 2 days with stools that were solid but dark 2 weeks ago. Her Hgb is down at 8.9. Iron studies are pending. She is on Pradaxa for bilateral PE and verbalized that she is taking as prescribed.  No bruising or petechiae noted on exam. No lymphadenopathy.  She still has puffiness in her face and under her eyes. This is unchanged.  No tenderness, numbness or tingling in her extremities. Mild swellng in her ankles that comes and goes and improves when elevating her feet.  No falls or syncopal episodes.  Her appetite comes and goes. She admits that she needs to drink more fluids. Her weight is stable.   ECOG Performance Status: 2 - Symptomatic, <50% confined to bed  Medications:  Allergies as of 10/28/2016    Reactions   No Known Allergies       Medication List       Accurate as of 10/28/16  9:41 PM. Always use your most recent med list.          azithromycin 250 MG tablet Commonly known as:  ZITHROMAX Z-PAK Take as directed on package.   calcium-vitamin D 500-200 MG-UNIT tablet Take 1 tablet by mouth daily.   dabigatran 150 MG Caps capsule Commonly known as:  PRADAXA Take 1 capsule (150 mg total) by mouth every 12 (twelve) hours.   diltiazem 120 MG 24 hr capsule Commonly known as:  CARDIZEM CD Take 1 capsule (120 mg total) by mouth daily.   DULoxetine 60 MG capsule Commonly known as:  CYMBALTA Take 60 mg by mouth daily. Reported on 08/06/2015   fluticasone 50 MCG/ACT nasal spray Commonly known as:  FLONASE Place 2 sprays into both nostrils daily.   folic acid 1 MG tablet Commonly known as:  FOLVITE Take 1 tablet (1 mg total) by mouth daily. Start 5-7 days before Alimta chemotherapy. Continue until 21 days after Alimta completed.   glipiZIDE 5 MG tablet Commonly known as:  GLUCOTROL Take 1 tablet (5 mg total) by mouth 2 (two) times daily before a meal.   HYDROcodone-homatropine 5-1.5 MG/5ML syrup Commonly known as:  HYCODAN Take 5 mLs by mouth every 6 (six) hours as needed for cough.   ipratropium-albuterol 0.5-2.5 (3) MG/3ML Soln Commonly known as:  DUONEB Take 3 mLs by nebulization  3 (three) times daily.   lidocaine-prilocaine cream Commonly known as:  EMLA Apply to affected area once   LORazepam 0.5 MG tablet Commonly known as:  ATIVAN take 1 tablet by mouth every 6 hours if needed for nausea or vomiting   meloxicam 15 MG tablet Commonly known as:  MOBIC Take 15 mg by mouth every other day.   metoprolol tartrate 25 MG tablet Commonly known as:  LOPRESSOR Take 1 tablet (25 mg total) by mouth 2 (two) times daily.   montelukast 10 MG tablet Commonly known as:  SINGULAIR take 1 tablet by mouth at bedtime   omeprazole 20 MG capsule Commonly known as:   PRILOSEC Take 1 capsule (20 mg total) by mouth daily.   ondansetron 4 MG tablet Commonly known as:  ZOFRAN take 1 tablet by mouth once daily   polyethylene glycol powder powder Commonly known as:  GLYCOLAX/MIRALAX take 17GM (DISSOLVED IN WATER) by mouth twice a day   potassium chloride SA 20 MEQ tablet Commonly known as:  K-DUR,KLOR-CON Take 1 tablet (20 mEq total) by mouth daily.   prochlorperazine 10 MG tablet Commonly known as:  COMPAZINE   simvastatin 20 MG tablet Commonly known as:  ZOCOR Take 20 mg by mouth daily.   STIOLTO RESPIMAT 2.5-2.5 MCG/ACT Aers Generic drug:  Tiotropium Bromide-Olodaterol inhale 2 puffs INTO THE LUNGS DAILY   triamterene-hydrochlorothiazide 75-50 MG tablet Commonly known as:  MAXZIDE Take 1 tablet by mouth daily.   verapamil 180 MG CR tablet Commonly known as:  CALAN-SR   vitamin C 1000 MG tablet Take 1,000 mg by mouth daily.       Allergies:  Allergies  Allergen Reactions  . No Known Allergies     Past Medical History, Surgical history, Social history, and Family History were reviewed and updated.  Review of Systems: All other 10 point review of systems is negative.   Physical Exam:  weight is 178 lb (80.7 kg). Her oral temperature is 97.8 F (36.6 C). Her blood pressure is 88/76 (abnormal) and her pulse is 88. Her respiration is 18 and oxygen saturation is 98%.   Wt Readings from Last 3 Encounters:  10/28/16 178 lb (80.7 kg)  10/20/16 171 lb (77.6 kg)  10/19/16 169 lb (76.7 kg)    Ocular: Sclerae unicteric, pupils equal, round and reactive to light Ear-nose-throat: Oropharynx clear, dentition fair Lymphatic: No cervical, supraclavicular or axillary adenopathy Lungs coarse throughout with mild wheezes and rhonchi in both the left upper and lower lobes, good excursion bilaterally Heart regular rate and rhythm, no murmur appreciated Abd soft, nontender, positive bowel sounds, no liver or spleen tip palpated on exam, no  fluid wave  MSK no focal spinal tenderness, no joint edema Neuro: non-focal, well-oriented, appropriate affect Breasts: Deferred  Lab Results  Component Value Date   WBC 9.0 10/28/2016   HGB 8.9 (L) 10/28/2016   HCT 27.1 (L) 10/28/2016   MCV 100 10/28/2016   PLT 326 10/28/2016   Lab Results  Component Value Date   FERRITIN 2,423 (H) 10/20/2016   IRON 117 10/20/2016   TIBC 263 10/20/2016   UIBC 145 10/20/2016   IRONPCTSAT 45 10/20/2016   Lab Results  Component Value Date   RBC 2.71 (L) 10/28/2016   No results found for: KPAFRELGTCHN, LAMBDASER, KAPLAMBRATIO No results found for: IGGSERUM, IGA, IGMSERUM No results found for: TOTALPROTELP, ALBUMINELP, A1GS, A2GS, BETS, BETA2SER, GAMS, MSPIKE, SPEI   Chemistry      Component Value Date/Time   NA 137  10/28/2016 1129   K 4.0 10/28/2016 1129   CL 96 (L) 10/20/2016 1042   CO2 26 10/28/2016 1129   BUN 11.4 10/28/2016 1129   CREATININE 1.1 10/28/2016 1129      Component Value Date/Time   CALCIUM 9.3 10/28/2016 1129   ALKPHOS 143 10/28/2016 1129   AST 19 10/28/2016 1129   ALT 16 10/28/2016 1129   BILITOT 0.55 10/28/2016 1129     Impression and Plan: Ms. Tant is a 56 yo white female with metastatic poorly differentiated carcinoma of the left lung and wild-type for the typical genetic mutations. She is feeling better since we saw her last week. She still has SOB with exertion and some fatigue.  It has now been over 6 weeks since her last treatment. She is concerned about this and Dr. Marin Olp agrees that we should proceed with cycle 14 of Alimta.  Her iron studies are stable so no infusion needed at this time. Her Hgb is down a bit at 8.9. She is on Pradaxa for bilateral PEs so we will continue to monitor this closely. She did notice 2 episodes of dark solid stool 2 weeks ago but this has not recurred since.  With her having the dry cough and feeling that there is a "frog in her throat" we will start her on Diflucan 100 mg PO  daily to prevent possible esophageal candidiasis. She is also on a statin drug so we discussed signs and symptoms of rhabdomyolysis and she will let us know if she starts to experience any of these. We check LFT's at each visit.  We will plan to see her back in 3 week for repeat lab work, follow-up and treatment.  I spent at least 25 minute face to face counseling and performing assessment.  Both she and her mother know to contact our office with any questions or concerns. We can certainly see her sooner if need be.   Eliezer Bottom, NP 4/4/20189:41 PM

## 2016-10-29 LAB — IRON AND TIBC
%SAT: 50 % (ref 21–57)
Iron: 132 ug/dL (ref 41–142)
TIBC: 266 ug/dL (ref 236–444)
UIBC: 134 ug/dL (ref 120–384)

## 2016-10-29 LAB — FERRITIN: FERRITIN: 738 ng/mL — AB (ref 9–269)

## 2016-10-29 MED ORDER — FLUCONAZOLE 100 MG PO TABS: 100.0000 mg | ORAL_TABLET | Freq: Every day | ORAL | 1 refills | Status: AC

## 2016-11-01 ENCOUNTER — Other Ambulatory Visit: Payer: Self-pay | Admitting: Cardiothoracic Surgery

## 2016-11-02 ENCOUNTER — Other Ambulatory Visit: Payer: Self-pay | Admitting: Cardiovascular Disease

## 2016-11-03 ENCOUNTER — Other Ambulatory Visit: Payer: Self-pay | Admitting: *Deleted

## 2016-11-03 ENCOUNTER — Other Ambulatory Visit: Payer: Self-pay | Admitting: Cardiovascular Disease

## 2016-11-03 MED ORDER — METOPROLOL TARTRATE 25 MG PO TABS: 25.0000 mg | ORAL_TABLET | Freq: Two times a day (BID) | ORAL | 1 refills | Status: AC

## 2016-11-03 MED ORDER — DILTIAZEM HCL ER COATED BEADS 120 MG PO CP24: 120.0000 mg | ORAL_CAPSULE | Freq: Every day | ORAL | 2 refills | Status: AC

## 2016-11-03 MED ORDER — DABIGATRAN ETEXILATE MESYLATE 150 MG PO CAPS: 150.0000 mg | ORAL_CAPSULE | Freq: Two times a day (BID) | ORAL | 5 refills | Status: AC

## 2016-11-03 NOTE — Telephone Encounter (Signed)
Please advise 

## 2016-11-03 NOTE — Telephone Encounter (Signed)
Age 56 Wt 80.7kg (10/28/2016) Saw Dr Acie Fredrickson 08/04/2016  10/28/2016 SrCr 1.1 Hgb 8.9 HCT 27.1 See note of 10/28/2016  CrCl 72.75  Refill done for Pradaxa '150mg'$  q 12 hours

## 2016-11-03 NOTE — Telephone Encounter (Signed)
Please advise,   Sent to wrong pool.

## 2016-11-03 NOTE — Telephone Encounter (Signed)
Received refill request for Dr Acie Fredrickson to refill patients metoprolol but he has never refilled this for the patient. Okay to refill? Please advise. Thanks, MI

## 2016-11-03 NOTE — Telephone Encounter (Signed)
Ok to refill metoprolol for adequate supply until office visit

## 2016-11-04 ENCOUNTER — Encounter (HOSPITAL_COMMUNITY): Payer: Self-pay

## 2016-11-04 ENCOUNTER — Telehealth: Payer: Self-pay | Admitting: Emergency Medicine

## 2016-11-04 ENCOUNTER — Emergency Department (HOSPITAL_COMMUNITY)
Admission: EM | Admit: 2016-11-04 | Discharge: 2016-11-24 | Disposition: E | Payer: Medicare Other | Attending: Emergency Medicine | Admitting: Emergency Medicine

## 2016-11-04 DIAGNOSIS — E1140 Type 2 diabetes mellitus with diabetic neuropathy, unspecified: Secondary | ICD-10-CM | POA: Insufficient documentation

## 2016-11-04 DIAGNOSIS — Z7984 Long term (current) use of oral hypoglycemic drugs: Secondary | ICD-10-CM | POA: Diagnosis not present

## 2016-11-04 DIAGNOSIS — J449 Chronic obstructive pulmonary disease, unspecified: Secondary | ICD-10-CM | POA: Insufficient documentation

## 2016-11-04 DIAGNOSIS — J9600 Acute respiratory failure, unspecified whether with hypoxia or hypercapnia: Secondary | ICD-10-CM | POA: Diagnosis not present

## 2016-11-04 DIAGNOSIS — Z87891 Personal history of nicotine dependence: Secondary | ICD-10-CM | POA: Insufficient documentation

## 2016-11-04 DIAGNOSIS — R0602 Shortness of breath: Secondary | ICD-10-CM | POA: Diagnosis present

## 2016-11-04 DIAGNOSIS — Z79899 Other long term (current) drug therapy: Secondary | ICD-10-CM | POA: Insufficient documentation

## 2016-11-04 DIAGNOSIS — I469 Cardiac arrest, cause unspecified: Secondary | ICD-10-CM | POA: Diagnosis not present

## 2016-11-04 DIAGNOSIS — I10 Essential (primary) hypertension: Secondary | ICD-10-CM | POA: Insufficient documentation

## 2016-11-04 DIAGNOSIS — Z85118 Personal history of other malignant neoplasm of bronchus and lung: Secondary | ICD-10-CM | POA: Insufficient documentation

## 2016-11-05 ENCOUNTER — Other Ambulatory Visit: Payer: Self-pay | Admitting: Nurse Practitioner

## 2016-11-06 ENCOUNTER — Ambulatory Visit: Payer: BLUE CROSS/BLUE SHIELD | Admitting: Emergency Medicine

## 2016-11-09 ENCOUNTER — Other Ambulatory Visit: Payer: BLUE CROSS/BLUE SHIELD

## 2016-11-09 ENCOUNTER — Ambulatory Visit: Payer: BLUE CROSS/BLUE SHIELD | Admitting: Hematology & Oncology

## 2016-11-09 ENCOUNTER — Ambulatory Visit: Payer: BLUE CROSS/BLUE SHIELD

## 2016-11-09 LAB — ACID FAST CULTURE WITH REFLEXED SENSITIVITIES (MYCOBACTERIA)
Acid Fast Culture: NEGATIVE
Acid Fast Culture: NEGATIVE
Acid Fast Culture: NEGATIVE

## 2016-11-10 NOTE — Telephone Encounter (Signed)
Please send her a condolences card on our practice's behalf. Thanks you very much

## 2016-11-10 NOTE — Telephone Encounter (Signed)
Card has been mailed

## 2016-11-19 ENCOUNTER — Ambulatory Visit: Payer: BLUE CROSS/BLUE SHIELD

## 2016-11-19 ENCOUNTER — Ambulatory Visit: Payer: BLUE CROSS/BLUE SHIELD | Admitting: Family

## 2016-11-19 ENCOUNTER — Other Ambulatory Visit: Payer: BLUE CROSS/BLUE SHIELD

## 2016-11-19 ENCOUNTER — Ambulatory Visit: Payer: BLUE CROSS/BLUE SHIELD | Admitting: Hematology & Oncology

## 2016-11-24 NOTE — ED Notes (Signed)
Bed: VF47 Expected date:  Expected time:  Means of arrival:  Comments: EMS resp distress

## 2016-11-24 NOTE — ED Triage Notes (Signed)
Patient arrives by EMS with CPR in progress. EMS states lost pulse on arrival to ED-patient to Room 16. EMS states patient told EMS she has a DNR and does not want CPR. Dr. Venora Maples at bedside. Patient is cyanotic, being bagged with full CPR in progress. Patient is pulseless and apneic. CPR terminated at 0220.

## 2016-11-24 NOTE — ED Provider Notes (Signed)
[facility] DEPT Provider Note   CSN: [id number] Arrival date & time: 11-10-2016  0222     History   Chief Complaint Chief Complaint  Patient presents with  . Respiratory Arrest   Level V caveat: Unresponsive/cardiac/pulmonary arrest HPI Carol Buchanan is a 56 y.o. female.  HPI Patient brought to the emergency department via EMS after developing increasing shortness of breath over the past 2-3 hours.  Patient was initiated on BiPAP by EMS and has a history of advanced stage lung cancer.  She is given 50 mg of albuterol 1 mg of Atrovent in route and became unresponsive in route and arrested for EMS.  CPR was initiated by the EMS team and on arrival to the emergency department however they did state that in route the patient stated that she did not not want CPR and she did not want to be intubated.  No family is available at this time.  Given this history obtained by EMS an provided to [place], no additional CPR was performed. The patient is in asystole.  Time of death 2:20 AM  Past Medical History:  Diagnosis Date  . COPD (chronic obstructive pulmonary disease) ([place])   . DM (diabetes mellitus), type 2, uncontrolled ([place]) 11/26/2015  . Hyperlipidemia   . Hypertension   . Neuropathy ([place])   . Primary lung cancer with metastasis from lung to other site [facility]) 07/26/2015  . 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 Sinus trouble     Patient Active Problem List   Diagnosis Date Noted  . Generalized abdominal pain   . Surgery, elective   . Chest tube in place   . Cardiac tamponade   . Pulmonary embolism ([place]) 09/22/2016  . Pericardial effusion 08/04/2016  . Lower GI bleed 12/27/2015  . Chronic respiratory failure ([place]), on home O2 12/13/2015  . Hyperglycemia due to steroids 12/02/2015  . Pneumonitis, from immunomodulator   . DM (diabetes mellitus), type 2, uncontrolled ([place]) 11/26/2015  . Essential hypertension 11/26/2015    . Acute hypoxemic respiratory failure ([place]) 11/25/2015  . 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 Primary lung cancer with metastasis from lung to other site [facility]) 07/26/2015  . Focal atelectasis 06/25/2015  . Neuropathy ([place])   . Spinal stenosis of lumbar region 07/10/2013  . Spinal stenosis in cervical region 07/10/2013  . COPD (chronic obstructive pulmonary disease) ([place]) 12/09/2012  . Dyspnea 11/07/2012  . Seasonal allergies 11/07/2012    Past Surgical History:  Procedure Laterality Date  . BACK SURGERY  2015  . CHEST TUBE INSERTION Left 09/26/2016   Procedure: CHEST TUBE INSERTION;  Surgeon: Ivin Poot, MD;  Location: [facility];  Service: Thoracic;  Laterality: Left;  . COLONOSCOPY Left 12/28/2015   Procedure: COLONOSCOPY;  Surgeon: Wilford Corner, MD;  Location: [facility];  Service: Endoscopy;  Laterality: Left;  . ESOPHAGOGASTRODUODENOSCOPY N/A 12/28/2015   Procedure: ESOPHAGOGASTRODUODENOSCOPY (EGD);  Surgeon: Wilford Corner, MD;  Location: [facility];  Service: Endoscopy;  Laterality: N/A;  . SUBXYPHOID PERICARDIAL WINDOW N/A 09/26/2016   Procedure: SUBXYPHOID PERICARDIAL WINDOW;  Surgeon: Ivin Poot, MD;  Location: [facility];  Service: Thoracic;  Laterality: N/A;  . TEE WITHOUT CARDIOVERSION N/A 09/26/2016   Procedure: TRANSESOPHAGEAL ECHOCARDIOGRAM (TEE);  Surgeon: Ivin Poot, MD;  Location: [facility];  Service: Thoracic;  Laterality: N/A;  . TUBAL LIGATION  1994  . VIDEO BRONCHOSCOPY Bilateral 07/01/2015   Procedure: VIDEO BRONCHOSCOPY WITHOUT FLUORO;  Surgeon: Herbie Baltimore  Agustina Caroli, MD;  Location: [facility];  Service: Cardiopulmonary;  Laterality: Bilateral;    OB History    No data available       Home Medications    Prior to Admission medications   Medication Sig Start Date End Date Taking? Authorizing Provider  Ascorbic Acid (VITAMIN C) 1000 MG tablet Take 1,000 mg by mouth daily.    Historical Provider, MD  azithromycin (ZITHROMAX Z-PAK) 250 MG tablet Take as  directed on package. 10/20/16   Eliezer Bottom, NP  Calcium Carbonate-Vitamin D (CALCIUM-VITAMIN D) 500-200 MG-UNIT tablet Take 1 tablet by mouth daily.    Historical Provider, MD  dabigatran (PRADAXA) 150 MG CAPS capsule Take 1 capsule (150 mg total) by mouth every 12 (twelve) hours. 11/03/16   Thayer Headings, MD  diltiazem (CARDIZEM CD) 120 MG 24 hr capsule Take 1 capsule (120 mg total) by mouth daily. 11/03/16   Thayer Headings, MD  DULoxetine (CYMBALTA) 60 MG capsule Take 60 mg by mouth daily. Reported on 08/06/2015 08/02/15   Historical Provider, MD  fluconazole (DIFLUCAN) 100 MG tablet Take 1 tablet (100 mg total) by mouth daily. 10/29/16   Eliezer Bottom, NP  fluticasone [facility]) 50 MCG/ACT nasal spray Place 2 sprays into both nostrils daily. 01/30/16   Collene Gobble, MD  folic acid (FOLVITE) 1 MG tablet Take 1 tablet (1 mg total) by mouth daily. Start 5-7 days before Alimta chemotherapy. Continue until 21 days after Alimta completed. 01/06/16   Volanda Napoleon, MD  glipiZIDE (GLUCOTROL) 5 MG tablet Take 1 tablet (5 mg total) by mouth 2 (two) times daily before a meal. Patient taking differently: Take 5 mg by mouth daily before breakfast.  12/02/15   Debbe Odea, MD  HYDROcodone-homatropine (HYCODAN) 5-1.5 MG/5ML syrup Take 5 mLs by mouth every 6 (six) hours as needed for cough. 10/20/16   Eliezer Bottom, NP  ipratropium-albuterol (DUONEB) 0.5-2.5 (3) MG/3ML SOLN Take 3 mLs by nebulization 3 (three) times daily. Patient taking differently: Take 3 mLs by nebulization daily.  12/28/15   Chauncey Cruel, MD  lidocaine-prilocaine (EMLA) cream Apply to affected area once 01/06/16   Volanda Napoleon, MD  LORazepam (ATIVAN) 0.5 MG tablet take 1 tablet by mouth every 6 hours if needed for nausea or vomiting 07/06/16   Volanda Napoleon, MD  meloxicam (MOBIC) 15 MG tablet Take 15 mg by mouth every other day.    Historical Provider, MD  metoprolol tartrate (LOPRESSOR) 25 MG tablet Take 1 tablet (25 mg  total) by mouth 2 (two) times daily. 11/03/16   Thayer Headings, MD  montelukast (SINGULAIR) 10 MG tablet take 1 tablet by mouth at bedtime 09/04/16   Eliezer Bottom, NP  omeprazole (PRILOSEC) 20 MG capsule Take 1 capsule (20 mg total) by mouth daily. 12/02/15   Debbe Odea, MD  ondansetron (ZOFRAN) 4 MG tablet take 1 tablet by mouth once daily 07/21/16   Volanda Napoleon, MD  polyethylene glycol powder [facility]) powder take 17GM (DISSOLVED IN WATER) by mouth twice a day 08/05/16   Historical Provider, MD  potassium chloride SA (K-DUR,KLOR-CON) 20 MEQ tablet Take 1 tablet (20 mEq total) by mouth daily. 08/24/16   Volanda Napoleon, MD  prochlorperazine (COMPAZINE) 10 MG tablet  10/14/16   Historical Provider, MD  simvastatin (ZOCOR) 20 MG tablet Take 20 mg by mouth daily.     Historical Provider, MD  STIOLTO RESPIMAT 2.5-2.5 MCG/ACT AERS inhale 2 puffs INTO THE  LUNGS DAILY 07/28/16   Collene Gobble, MD  triamterene-hydrochlorothiazide (MAXZIDE) 75-50 MG tablet Take 1 tablet by mouth daily. 07/13/16   Volanda Napoleon, MD  verapamil (CALAN-SR) 180 MG CR tablet  10/17/16   Historical Provider, MD    Family History Family History  Problem Relation Age of Onset  . Heart disease Father   . Heart attack Father   . Arthritis Mother     Social History Social History  Substance Use Topics  . Smoking status: Former Smoker    Packs/day: 1.50    Years: 35.00    Types: Cigarettes    Quit date: 01/26/2011  . Smokeless tobacco: Never Used  . Alcohol use 1.2 oz/week    2 Standard drinks or equivalent per week     Comment: on weekends     Allergies   No known allergies   Review of Systems Review of Systems  Unable to perform ROS: Patient unresponsive     Physical Exam Updated Vital Signs There were no vitals taken for this visit.  Physical Exam  Constitutional: She appears well-developed and well-nourished.  HENT:  Head: Atraumatic.  Eyes:  Fixed and dilated  Neck: Neck supple.    No crepitus  Cardiovascular:  Pulses present with CPR.  Absent cardiac sounds  Pulmonary/Chest:  Bilateral breath sounds present with bag mask ventilation  Abdominal: She exhibits no distension.  Genitourinary:  Genitourinary Comments: Normal external genitalia  Musculoskeletal:  No deformity of extremities  Neurological:  GCS 3  Skin: Skin is warm. No rash noted.  Psychiatric:  Unable to test     ED Treatments / Results  Labs (all labs ordered are listed, but only abnormal results are displayed) Labs Reviewed - No data to display  EKG  EKG Interpretation None       Radiology No results found.  Procedures Procedures (including critical care time)  Medications Ordered in ED Medications - No data to display   Initial Impression / Assessment and Plan / ED Course  I have reviewed the triage vital signs and the nursing notes.  Pertinent labs & imaging results that were available during my care of the patient were reviewed by me and considered in my medical decision making (see chart for details).     Asystole on arrival to the emergency department.  Given the history obtained by the paramedics in route no additional CPR or ACLS was performed.  Time of death 09-30-22  Final Clinical Impressions(s) / ED Diagnoses   Final diagnoses:  Acute respiratory failure, unspecified whether with hypoxia or hypercapnia ([place])  Cardiac arrest [facility])    New Prescriptions New Prescriptions   No medications on file     Jola Schmidt, MD 11/19/2016 [phone]

## 2016-11-24 NOTE — Progress Notes (Signed)
Pt's mother and sister were bedside when I arrived; shortly following pt's passing. Pt's mother was very, but appropriately tearful, sobbing at times. She talked about her Carol describing her as stubborn and mean. Pt's mother said she would still be here if she had listened to her. Pt's sister was also tearful, but she was very attentive to her mother. I allowed the two of them to process their loss and provided emotional and pastoral support. They acknowledged God in their process and acceptance of what happened. They wanted prayer. After making calls to pt's brother, [name] and Carol Buchanan (mother and sister) said their final good byes and left. Family was very appreciative of visit and support. Chaplain Marlise Eves, M.Div.   11-16-16 0400  Clinical Encounter Type  Visited With Family

## 2016-11-24 NOTE — ED Triage Notes (Signed)
Pt reporting via EMS from home in respiratory distress. Per EMS, pt was having shortness of breath for the last 2-3 hours. Pt is a stage 4 lung cancer pt. Pt received 2 breathing treatments en route, '15mg'$  albuterol total and '1mg'$  atrovent given. Pt was placed on CPAP en route and was alert. Pt became unresponsive en route to hospital and arrested in the back of the truck. Chest compressions started in the truck and continued into the room. MD at bedside upon arrival to the room.

## 2016-11-24 NOTE — ED Notes (Signed)
Chaplain at bedside with family

## 2016-11-24 NOTE — Telephone Encounter (Signed)
Will forward to RB as FYI

## 2016-11-24 NOTE — ED Notes (Signed)
Screening questions not answered as pt was unresponsive on arrival to ER.

## 2016-11-24 DEATH — deceased

## 2016-11-30 ENCOUNTER — Other Ambulatory Visit: Payer: BLUE CROSS/BLUE SHIELD

## 2016-11-30 ENCOUNTER — Ambulatory Visit: Payer: BLUE CROSS/BLUE SHIELD

## 2016-11-30 ENCOUNTER — Ambulatory Visit: Payer: BLUE CROSS/BLUE SHIELD | Admitting: Hematology & Oncology

## 2016-12-08 ENCOUNTER — Ambulatory Visit: Payer: BLUE CROSS/BLUE SHIELD | Admitting: Hematology & Oncology

## 2016-12-08 ENCOUNTER — Other Ambulatory Visit: Payer: BLUE CROSS/BLUE SHIELD

## 2016-12-08 ENCOUNTER — Ambulatory Visit: Payer: BLUE CROSS/BLUE SHIELD

## 2016-12-22 ENCOUNTER — Other Ambulatory Visit: Payer: BLUE CROSS/BLUE SHIELD

## 2016-12-22 ENCOUNTER — Ambulatory Visit: Payer: BLUE CROSS/BLUE SHIELD | Admitting: Hematology & Oncology

## 2016-12-22 ENCOUNTER — Ambulatory Visit: Payer: BLUE CROSS/BLUE SHIELD

## 2016-12-29 ENCOUNTER — Ambulatory Visit: Payer: BLUE CROSS/BLUE SHIELD | Admitting: Cardiovascular Disease

## 2016-12-31 ENCOUNTER — Other Ambulatory Visit: Payer: BLUE CROSS/BLUE SHIELD

## 2016-12-31 ENCOUNTER — Ambulatory Visit: Payer: BLUE CROSS/BLUE SHIELD | Admitting: Hematology & Oncology

## 2016-12-31 ENCOUNTER — Ambulatory Visit: Payer: BLUE CROSS/BLUE SHIELD

## 2017-05-05 IMAGING — PT NM PET TUM IMG INITIAL (PI) SKULL BASE T - THIGH
8 series · 25 of 25 positions shown · non-contrast
Comparison: None.

CLINICAL DATA: Initial treatment strategy for LEFT upper lobe lung
mass..

EXAM:
NUCLEAR MEDICINE PET SKULL BASE TO THIGH
TECHNIQUE: 9.6 mCi F-18 FDG was injected intravenously. Full-ring PET imaging
was performed from the skull base to thigh after the radiotracer. CT
data was obtained and used for attenuation correction and anatomic
localization.
FASTING BLOOD GLUCOSE:  Value: 93 mg/dl

[Series 3: pet sk_thigh ac · axial · 5.0mm · 4.07mm/px · z∈[-1171,-371]mm · 5 of 201 slices shown]
[im 1/201]
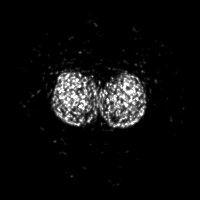
[im 51/201]
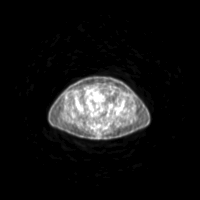
[im 101/201]
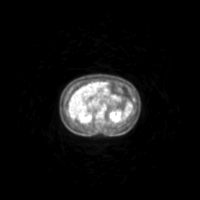
[im 151/201]
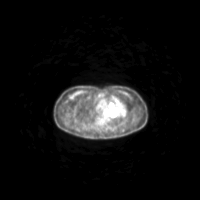
[im 201/201]
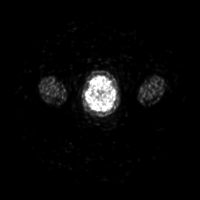

[Series 4: ct sk_thigh 5.0 b31f · axial · 5.0mm · 0.98mm/px · z∈[-1171,-371]mm · 5 of 201 slices shown]
[im 1/201]
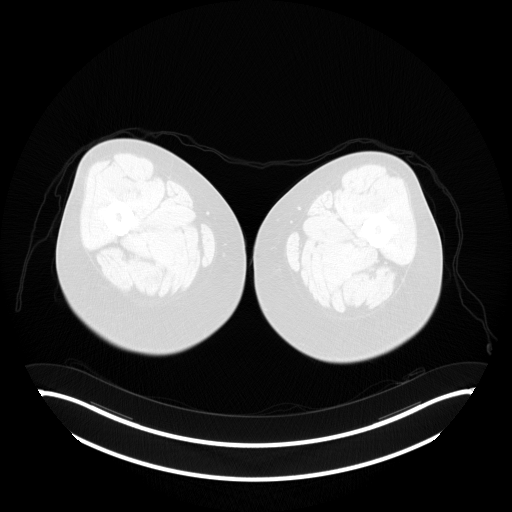
[im 51/201]
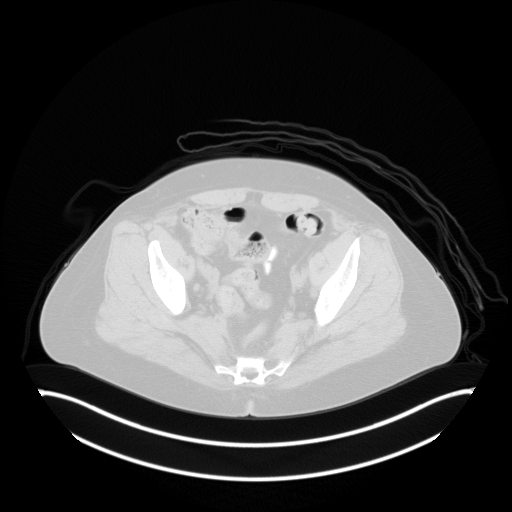
[im 101/201]
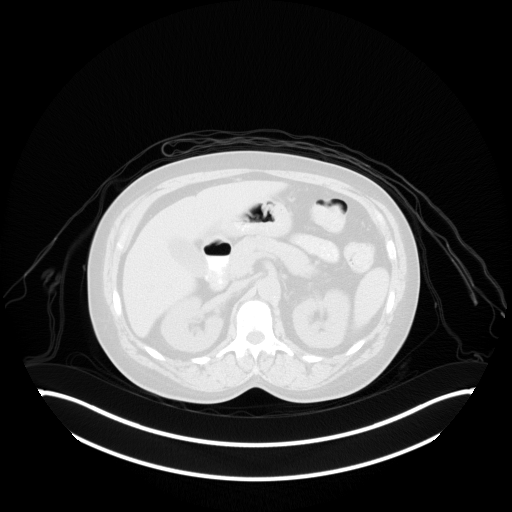
[im 151/201]
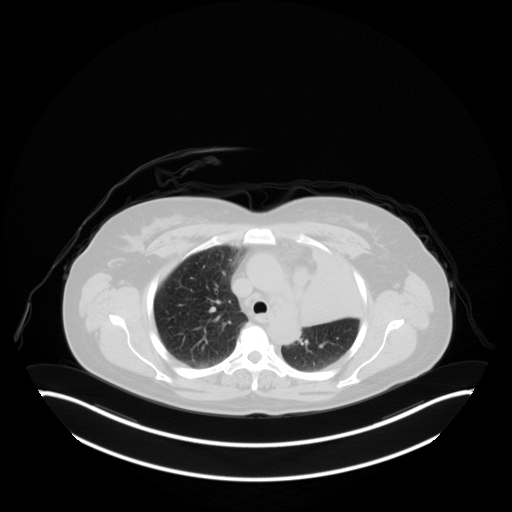
[im 201/201  brain]
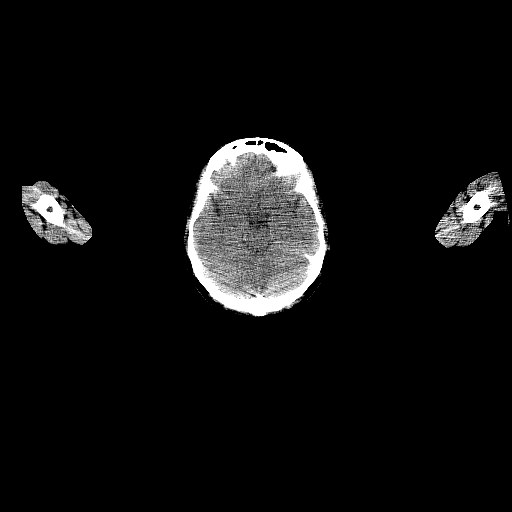

[Series 7: pet sk_thigh nac · axial · 5.0mm · 4.07mm/px · z∈[-1171,-371]mm · 5 of 201 slices shown]
[im 1/201]
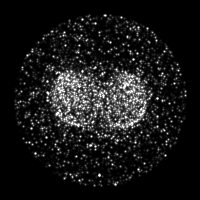
[im 51/201]
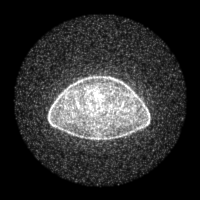
[im 101/201]
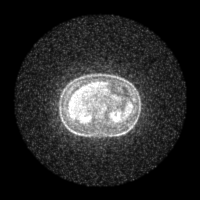
[im 151/201]
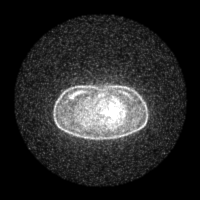
[im 201/201]
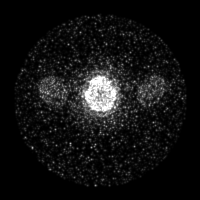

[Series 8: ct sk_thigh 5.0 b70f (id)_bone · axial · 5.0mm · 0.59mm/px · 1 of 56 slices shown]
[im 1/56  bone]
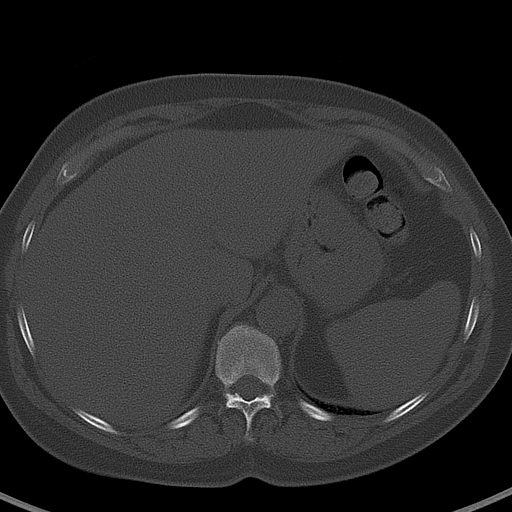

[Series 604: mip collection<mip range> · coronal · 1.68mm/px · 1 of 32 slices shown]
[im 1/32]
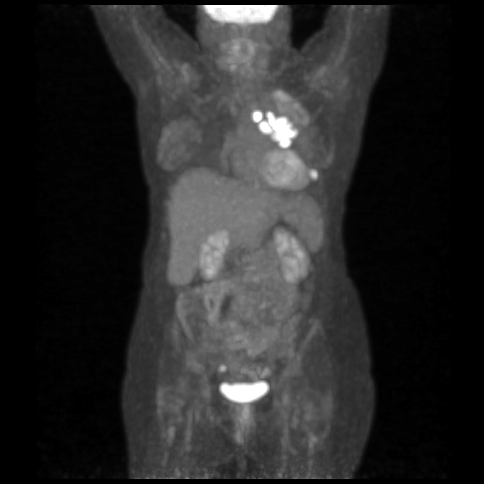

[Series 605: range-ct sk_thigh 5.0 (id)<alpha range> · 2 of 77 slices shown (1 of 2)]
[im 1/77]
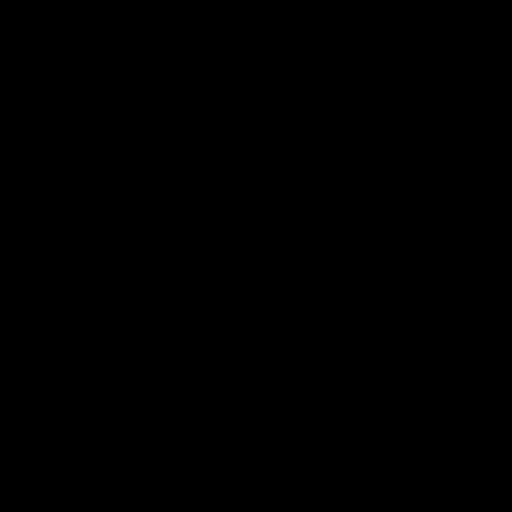
[im 77/77]
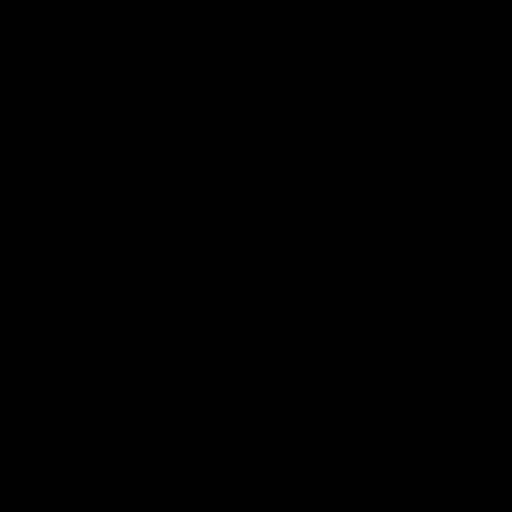

[Series 606: range-ct sk_thigh 5.0 (id)<alpha range> · 5 of 195 slices shown (2 of 2)]
[im 1/195]
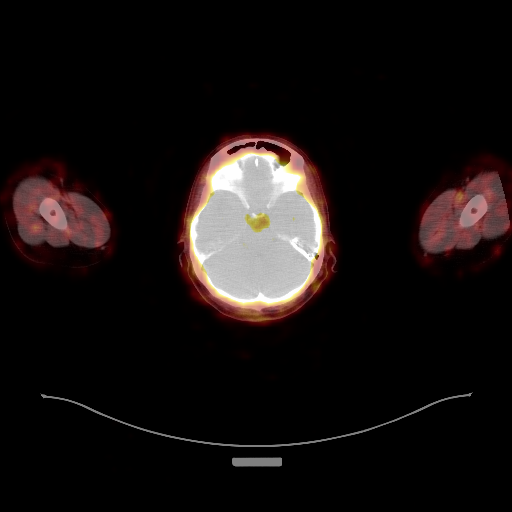
[im 49/195]
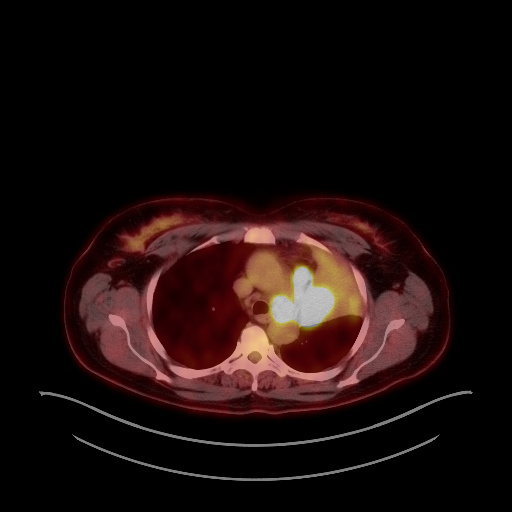
[im 98/195]
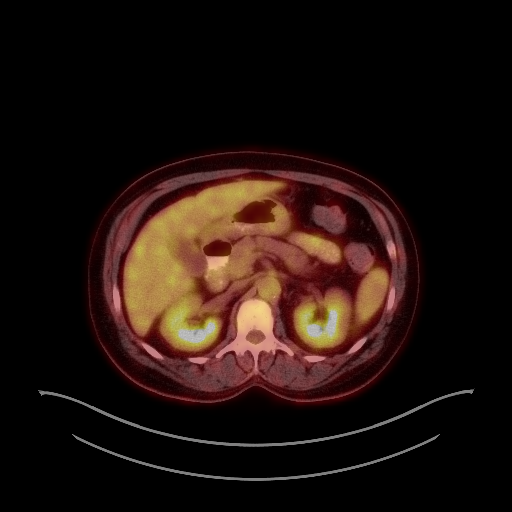
[im 146/195]
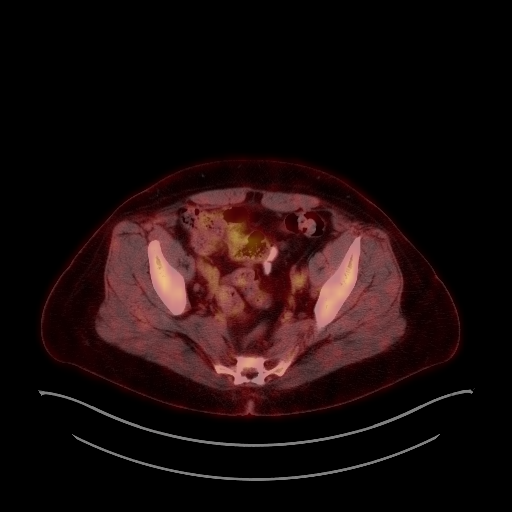
[im 195/195]
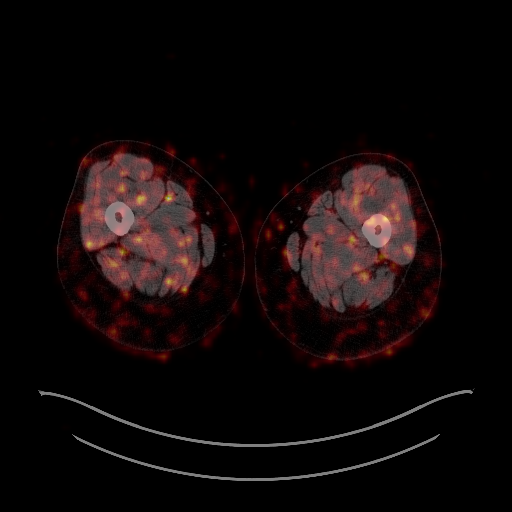

[Series 1032: results mm oncology reading · 0.89mm/px · 1 of 7 slices shown]
[im 1/7]
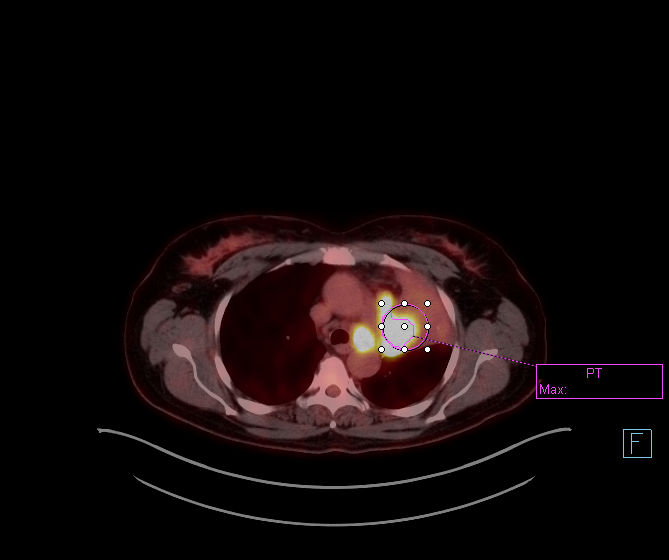

[25 of 25 positions shown; findings below may reference images not displayed]

FINDINGS: NECK

No hypermetabolic lymph nodes in the neck.

CHEST

LEFT suprahilar mass obstructs the LEFT upper lobe bronchus and
measures approximately 4.6 x 3.1 cm. Lesion as intensely metabolic
with SUV max equal 28.2. There is minimal metabolic activity in the
more peripheral collapsed LEFT upper lobe.

There is evidence of mediastinal metastasis without hypermetabolic
AP window node with SUV max equal 30.4. Hypermetabolic prevascular
lymph node measuring 13 mm on image 51, series 4.

There are no additional suspicious pulmonary nodules.

ABDOMEN/PELVIS

No abnormal hypermetabolic activity within the liver, pancreas,
adrenal glands, or spleen. No hypermetabolic lymph nodes in the
abdomen or pelvis.

SKELETON

Focus of intense hypermetabolic activity within the T4 vertebral
body with SUV max equal 46.5. There is an associated lytic lesion on
the CT portion exam measuring 13 mm on image 47, series 4.

Intense metabolic activity associated with the LEFT anterior lateral
sixth rib (image 70 of the fused data set). Rib demonstrates a
nondisplaced fracture.
IMPRESSION: 1. Hypermetabolic LEFT suprahilar mass with obstruction of the LEFT
upper lobe bronchus with upper lobe collapse.
2. Ipsilateral nodal metastasis to the AP window and prevascular
space.
3. Solitary spine metastasis to the T4 vertebral body with intense
metabolic activity (SUV max equal 47).
4. LEFT anterior lateral sixth rib fracture is likely pathologic.

## 2017-10-16 IMAGING — DX DG CHEST 2V
2 series · 2 of 2 positions shown · non-contrast
Comparison: 11/30/2015

CLINICAL DATA: Pneumonitis, followup hospitalization, hypertension,
COPD, lung cancer post radiation therapy, former smoker, diabetes
mellitus

EXAM:
CHEST  2 VIEW

[chest pa]
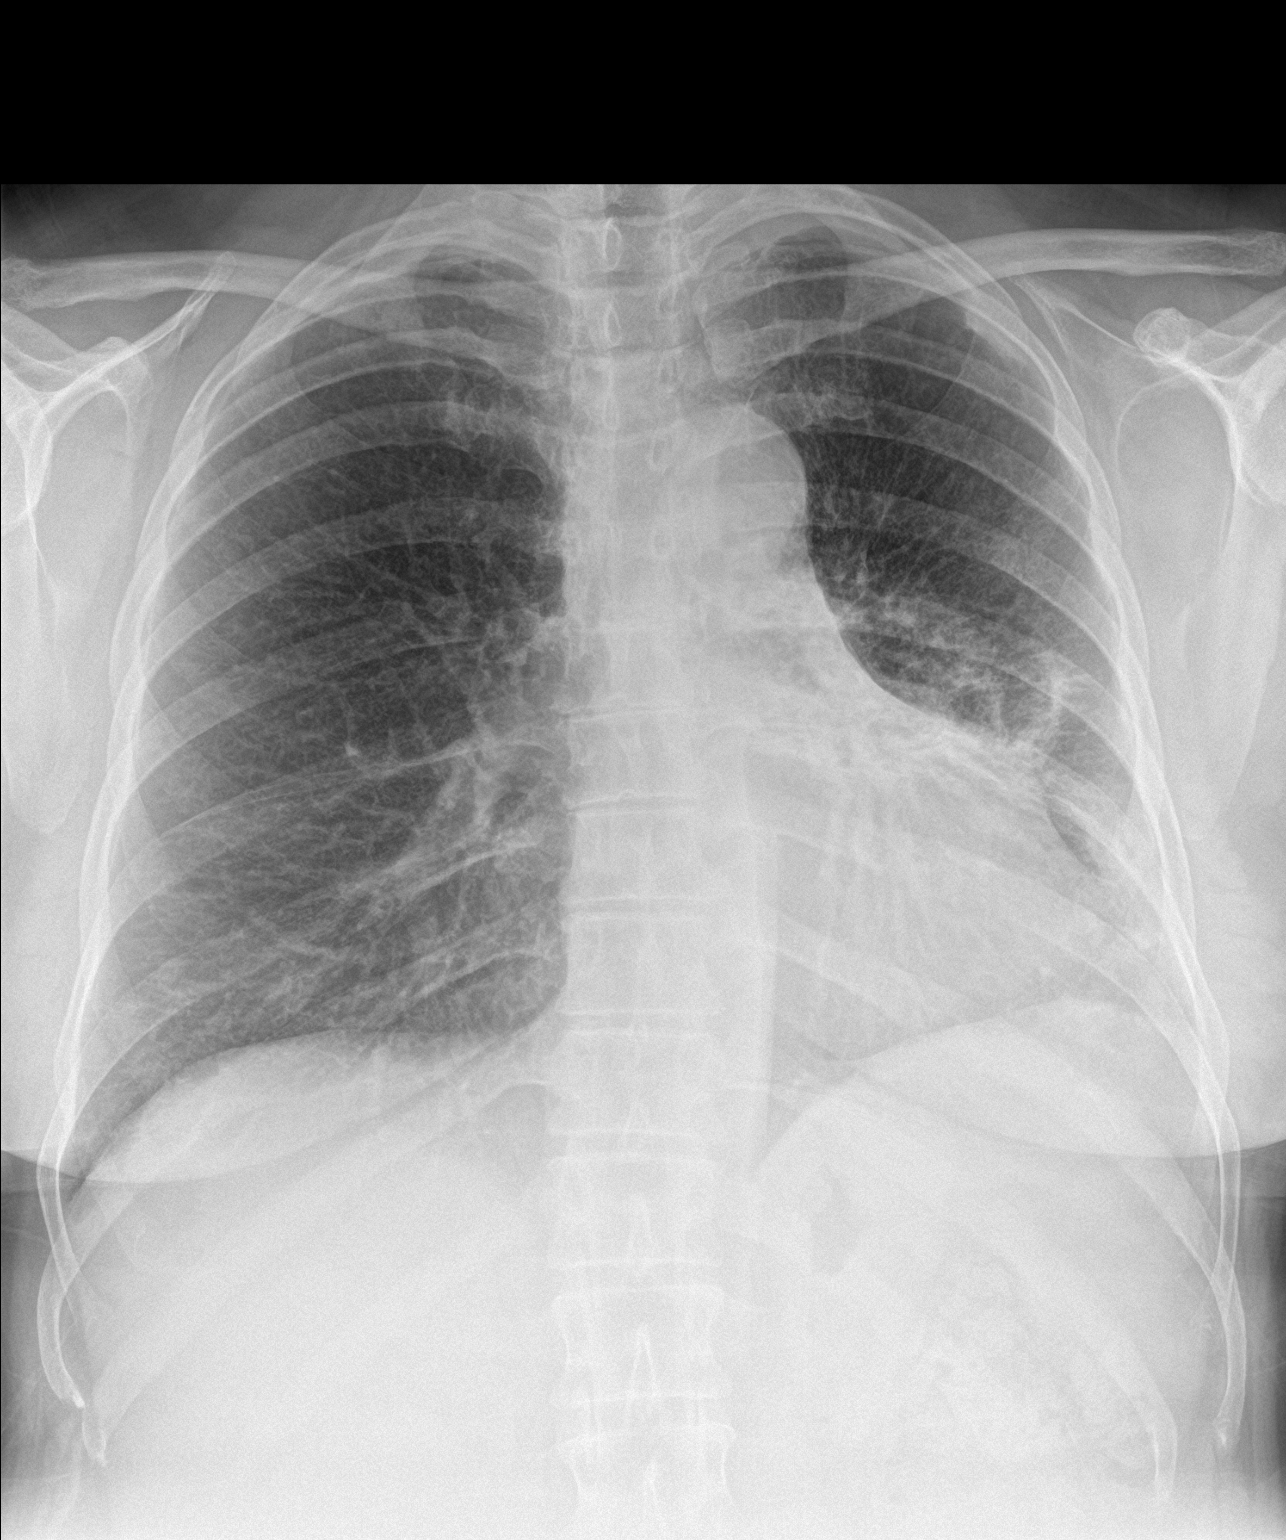

[chest lat]
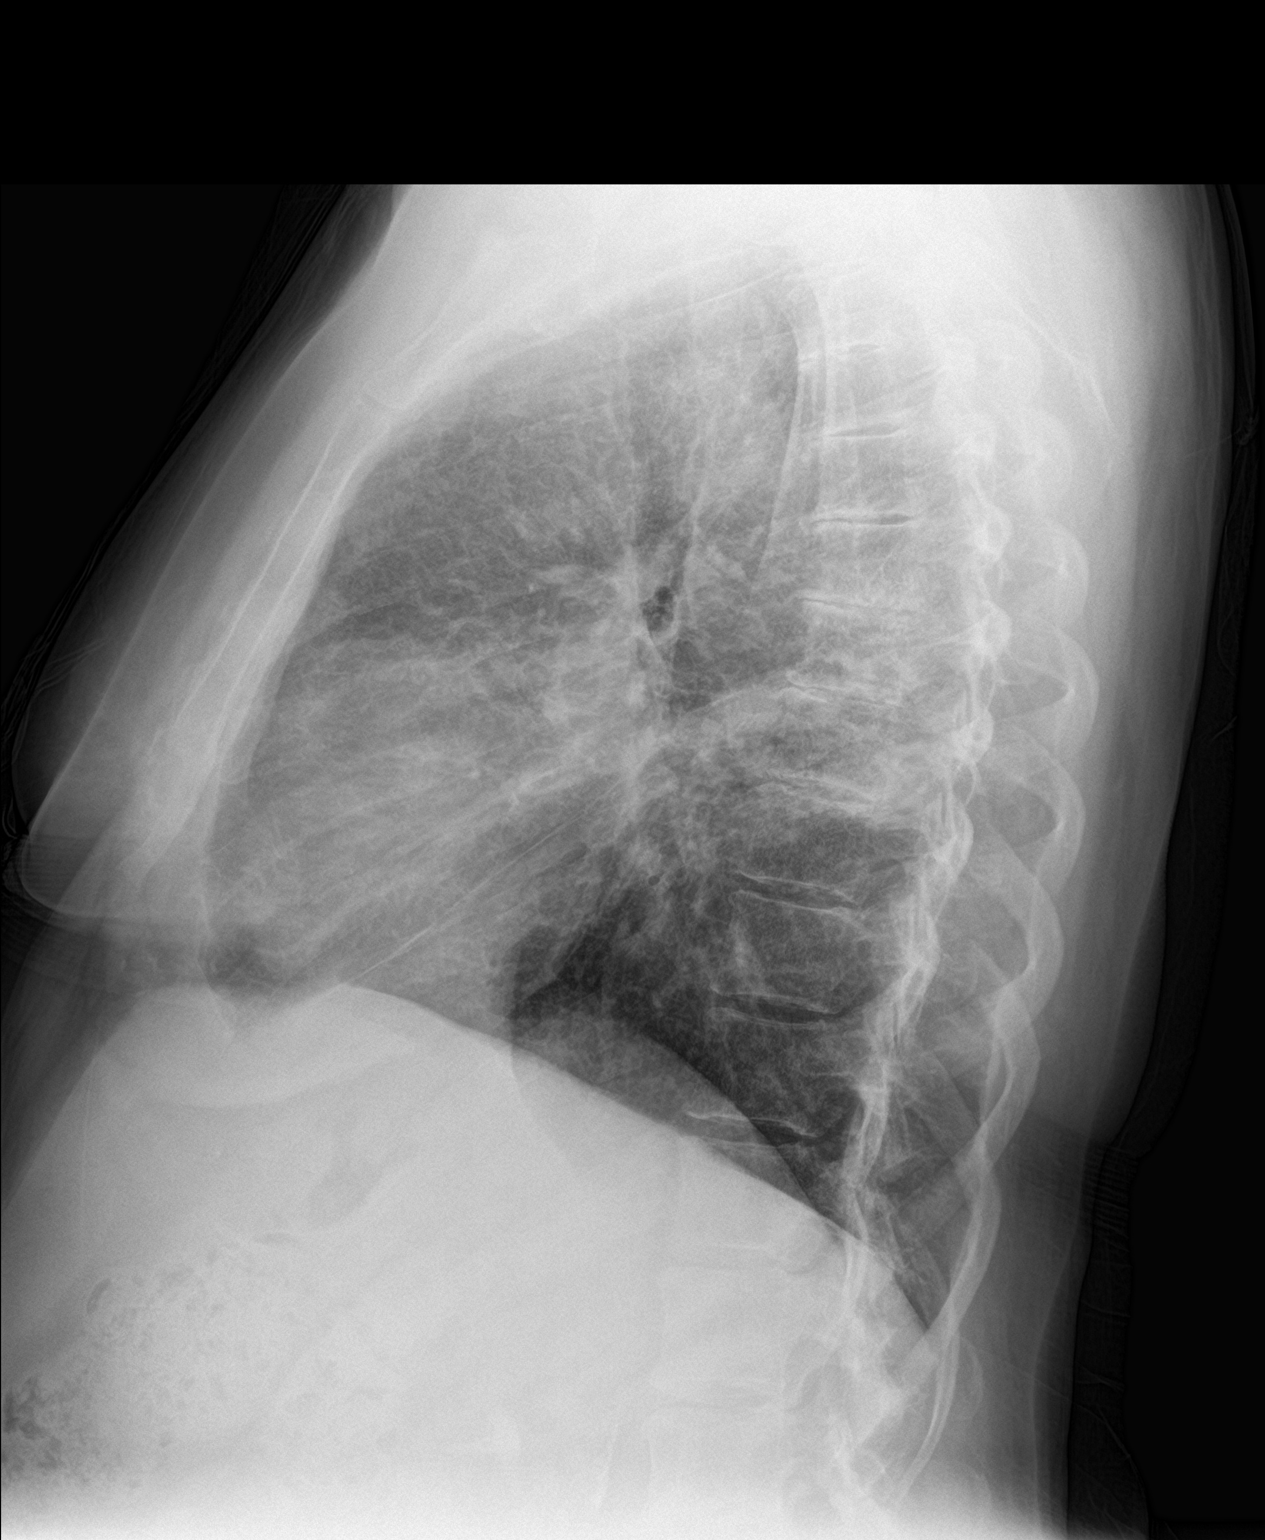

[2 of 2 positions shown; findings below may reference images not displayed]

FINDINGS: Upper normal heart size.

Prominent soft tissue at LEFT hilum with infiltrate in perihilar
region into lingula, could be related to radiation pneumonitis or
infection.

Minimal chronic accentuation of interstitial markings at the RIGHT
base.

Remaining lungs clear.

No definite pleural effusion or pneumothorax.

Osseous structures unremarkable.
IMPRESSION: Prominent density at LEFT hilum and perihilar region into lingula
which may represent radiation pneumonitis or infection.

Little interval change.
